# Patient Record
Sex: Female | Born: 1945
Health system: Southern US, Community
[De-identification: ages and names within clinical notes are randomized; demographics above are authoritative.]

## PROBLEM LIST (undated history)

## (undated) DIAGNOSIS — F418 Other specified anxiety disorders: Secondary | ICD-10-CM

## (undated) DIAGNOSIS — M1990 Unspecified osteoarthritis, unspecified site: Secondary | ICD-10-CM

## (undated) DIAGNOSIS — I890 Lymphedema, not elsewhere classified: Secondary | ICD-10-CM

## (undated) DIAGNOSIS — E785 Hyperlipidemia, unspecified: Secondary | ICD-10-CM

## (undated) DIAGNOSIS — R7309 Other abnormal glucose: Secondary | ICD-10-CM

## (undated) DIAGNOSIS — R7303 Prediabetes: Secondary | ICD-10-CM

## (undated) DIAGNOSIS — I10 Essential (primary) hypertension: Secondary | ICD-10-CM

## (undated) DIAGNOSIS — K219 Gastro-esophageal reflux disease without esophagitis: Secondary | ICD-10-CM

## (undated) DIAGNOSIS — R55 Syncope and collapse: Secondary | ICD-10-CM

## (undated) DIAGNOSIS — M797 Fibromyalgia: Secondary | ICD-10-CM

## (undated) DIAGNOSIS — C439 Malignant melanoma of skin, unspecified: Secondary | ICD-10-CM

## (undated) HISTORY — DX: Syncope and collapse: R55

## (undated) HISTORY — DX: Gastro-esophageal reflux disease without esophagitis: K21.9

## (undated) HISTORY — DX: Other specified anxiety disorders: F41.8

## (undated) HISTORY — DX: Other abnormal glucose: R73.09

## (undated) HISTORY — PX: ABDOMINAL HYSTERECTOMY: SUR658

## (undated) HISTORY — DX: Hyperlipidemia, unspecified: E78.5

## (undated) HISTORY — DX: Essential (primary) hypertension: I10

## (undated) HISTORY — DX: Malignant melanoma of skin, unspecified: C43.9

## (undated) HISTORY — PX: TOTAL KNEE ARTHROPLASTY: SHX125

## (undated) HISTORY — DX: Fibromyalgia: M79.7

## (undated) HISTORY — PX: BREAST SURGERY: SHX581

## (undated) HISTORY — PX: ELBOW SURGERY: SHX618

## (undated) HISTORY — PX: CHOLECYSTECTOMY: SHX55

## (undated) HISTORY — PX: OTHER SURGICAL HISTORY: SHX169

---

## 1999-07-23 ENCOUNTER — Other Ambulatory Visit: Admission: RE | Admit: 1999-07-23 | Discharge: 1999-07-23 | Payer: Self-pay | Admitting: Obstetrics and Gynecology

## 2000-09-19 ENCOUNTER — Other Ambulatory Visit: Admission: RE | Admit: 2000-09-19 | Discharge: 2000-09-19 | Payer: Self-pay | Admitting: Obstetrics and Gynecology

## 2001-03-13 ENCOUNTER — Inpatient Hospital Stay (HOSPITAL_COMMUNITY): Admission: EM | Admit: 2001-03-13 | Discharge: 2001-03-14 | Payer: Self-pay | Admitting: Emergency Medicine

## 2001-03-13 ENCOUNTER — Encounter: Payer: Self-pay | Admitting: Emergency Medicine

## 2001-03-13 ENCOUNTER — Encounter: Payer: Self-pay | Admitting: Orthopaedic Surgery

## 2001-03-24 ENCOUNTER — Encounter: Admission: RE | Admit: 2001-03-24 | Discharge: 2001-06-22 | Payer: Self-pay | Admitting: Orthopaedic Surgery

## 2001-06-23 ENCOUNTER — Encounter: Admission: RE | Admit: 2001-06-23 | Discharge: 2001-08-06 | Payer: Self-pay | Admitting: Orthopaedic Surgery

## 2001-08-27 ENCOUNTER — Encounter: Admission: RE | Admit: 2001-08-27 | Discharge: 2001-09-15 | Payer: Self-pay | Admitting: Orthopaedic Surgery

## 2002-04-25 ENCOUNTER — Encounter: Admission: RE | Admit: 2002-04-25 | Discharge: 2002-04-25 | Payer: Self-pay | Admitting: Unknown Physician Specialty

## 2002-04-25 ENCOUNTER — Encounter: Payer: Self-pay | Admitting: Unknown Physician Specialty

## 2003-02-09 ENCOUNTER — Encounter: Admission: RE | Admit: 2003-02-09 | Discharge: 2003-02-09 | Payer: Self-pay | Admitting: Unknown Physician Specialty

## 2003-02-09 ENCOUNTER — Encounter: Payer: Self-pay | Admitting: Unknown Physician Specialty

## 2003-03-08 ENCOUNTER — Ambulatory Visit (HOSPITAL_COMMUNITY): Admission: RE | Admit: 2003-03-08 | Discharge: 2003-03-08 | Payer: Self-pay | Admitting: Unknown Physician Specialty

## 2003-04-14 ENCOUNTER — Ambulatory Visit (HOSPITAL_COMMUNITY): Admission: RE | Admit: 2003-04-14 | Discharge: 2003-04-15 | Payer: Self-pay | Admitting: General Surgery

## 2003-04-14 ENCOUNTER — Encounter (INDEPENDENT_AMBULATORY_CARE_PROVIDER_SITE_OTHER): Payer: Self-pay | Admitting: *Deleted

## 2003-06-22 ENCOUNTER — Encounter: Admission: RE | Admit: 2003-06-22 | Discharge: 2003-06-22 | Payer: Self-pay | Admitting: Obstetrics and Gynecology

## 2004-07-18 ENCOUNTER — Encounter: Admission: RE | Admit: 2004-07-18 | Discharge: 2004-07-18 | Payer: Self-pay | Admitting: Obstetrics and Gynecology

## 2004-08-17 ENCOUNTER — Ambulatory Visit: Payer: Self-pay | Admitting: Family Medicine

## 2004-09-20 ENCOUNTER — Ambulatory Visit: Payer: Self-pay | Admitting: Family Medicine

## 2004-10-17 ENCOUNTER — Ambulatory Visit: Payer: Self-pay | Admitting: Family Medicine

## 2005-01-28 ENCOUNTER — Ambulatory Visit: Payer: Self-pay | Admitting: Family Medicine

## 2005-06-04 ENCOUNTER — Ambulatory Visit: Payer: Self-pay | Admitting: Family Medicine

## 2005-07-25 ENCOUNTER — Ambulatory Visit: Payer: Self-pay | Admitting: Family Medicine

## 2005-08-13 ENCOUNTER — Encounter: Admission: RE | Admit: 2005-08-13 | Discharge: 2005-08-13 | Payer: Self-pay | Admitting: Obstetrics and Gynecology

## 2005-08-29 ENCOUNTER — Encounter (INDEPENDENT_AMBULATORY_CARE_PROVIDER_SITE_OTHER): Payer: Self-pay | Admitting: Specialist

## 2005-08-29 ENCOUNTER — Ambulatory Visit (HOSPITAL_BASED_OUTPATIENT_CLINIC_OR_DEPARTMENT_OTHER): Admission: RE | Admit: 2005-08-29 | Discharge: 2005-08-29 | Payer: Self-pay | Admitting: General Surgery

## 2005-10-21 ENCOUNTER — Ambulatory Visit: Payer: Self-pay | Admitting: Hematology & Oncology

## 2005-11-11 LAB — COMPREHENSIVE METABOLIC PANEL
ALT: 40 U/L (ref 0–40)
AST: 25 U/L (ref 0–37)
CO2: 29 mEq/L (ref 19–32)
Chloride: 103 mEq/L (ref 96–112)
Sodium: 142 mEq/L (ref 135–145)
Total Bilirubin: 0.3 mg/dL (ref 0.3–1.2)
Total Protein: 7.4 g/dL (ref 6.0–8.3)

## 2005-11-11 LAB — CBC WITH DIFFERENTIAL/PLATELET
BASO%: 0.5 % (ref 0.0–2.0)
EOS%: 1.5 % (ref 0.0–7.0)
LYMPH%: 29.8 % (ref 14.0–48.0)
MCHC: 34.5 g/dL (ref 32.0–36.0)
MONO#: 0.5 10*3/uL (ref 0.1–0.9)
RBC: 4.4 10*6/uL (ref 3.70–5.32)
WBC: 9 10*3/uL (ref 3.9–10.0)
lymph#: 2.7 10*3/uL (ref 0.9–3.3)

## 2005-11-11 LAB — LACTATE DEHYDROGENASE: LDH: 180 U/L (ref 94–250)

## 2005-11-19 ENCOUNTER — Ambulatory Visit: Payer: Self-pay | Admitting: Family Medicine

## 2005-11-22 ENCOUNTER — Ambulatory Visit: Payer: Self-pay | Admitting: Family Medicine

## 2005-12-02 ENCOUNTER — Ambulatory Visit: Payer: Self-pay | Admitting: Family Medicine

## 2006-01-20 ENCOUNTER — Ambulatory Visit: Payer: Self-pay | Admitting: Family Medicine

## 2006-03-06 ENCOUNTER — Ambulatory Visit: Payer: Self-pay | Admitting: Hematology & Oncology

## 2006-03-10 LAB — CBC WITH DIFFERENTIAL/PLATELET
Eosinophils Absolute: 0.2 10*3/uL (ref 0.0–0.5)
MONO#: 0.4 10*3/uL (ref 0.1–0.9)
NEUT#: 5.2 10*3/uL (ref 1.5–6.5)
Platelets: 226 10*3/uL (ref 145–400)
RBC: 4.39 10*6/uL (ref 3.70–5.32)
RDW: 13.4 % (ref 11.3–14.5)
WBC: 8.3 10*3/uL (ref 3.9–10.0)

## 2006-03-10 LAB — LACTATE DEHYDROGENASE: LDH: 190 U/L (ref 94–250)

## 2006-03-10 LAB — COMPREHENSIVE METABOLIC PANEL
Albumin: 4.4 g/dL (ref 3.5–5.2)
CO2: 31 mEq/L (ref 19–32)
Chloride: 105 mEq/L (ref 96–112)
Glucose, Bld: 104 mg/dL — ABNORMAL HIGH (ref 70–99)
Potassium: 4.1 mEq/L (ref 3.5–5.3)
Sodium: 144 mEq/L (ref 135–145)
Total Protein: 7.2 g/dL (ref 6.0–8.3)

## 2006-05-27 ENCOUNTER — Ambulatory Visit: Payer: Self-pay | Admitting: Family Medicine

## 2006-06-18 ENCOUNTER — Encounter: Admission: RE | Admit: 2006-06-18 | Discharge: 2006-06-18 | Payer: Self-pay | Admitting: Unknown Physician Specialty

## 2006-07-02 ENCOUNTER — Ambulatory Visit: Payer: Self-pay | Admitting: Hematology & Oncology

## 2006-07-07 ENCOUNTER — Ambulatory Visit: Payer: Self-pay | Admitting: Hematology & Oncology

## 2006-07-07 LAB — COMPREHENSIVE METABOLIC PANEL
ALT: 32 U/L (ref 0–35)
AST: 26 U/L (ref 0–37)
Alkaline Phosphatase: 120 U/L — ABNORMAL HIGH (ref 39–117)
BUN: 15 mg/dL (ref 6–23)
Chloride: 103 mEq/L (ref 96–112)
Creatinine, Ser: 0.77 mg/dL (ref 0.40–1.20)
Total Bilirubin: 0.3 mg/dL (ref 0.3–1.2)

## 2006-07-07 LAB — CBC WITH DIFFERENTIAL/PLATELET
BASO%: 0.7 % (ref 0.0–2.0)
EOS%: 1.9 % (ref 0.0–7.0)
HCT: 40 % (ref 34.8–46.6)
LYMPH%: 24.8 % (ref 14.0–48.0)
MCH: 32.5 pg (ref 26.0–34.0)
MCHC: 35.1 g/dL (ref 32.0–36.0)
MCV: 92.6 fL (ref 81.0–101.0)
MONO%: 5.9 % (ref 0.0–13.0)
NEUT%: 66.7 % (ref 39.6–76.8)
lymph#: 2 10*3/uL (ref 0.9–3.3)

## 2006-07-21 ENCOUNTER — Ambulatory Visit: Payer: Self-pay | Admitting: Family Medicine

## 2006-08-20 ENCOUNTER — Encounter: Admission: RE | Admit: 2006-08-20 | Discharge: 2006-08-20 | Payer: Self-pay | Admitting: Obstetrics and Gynecology

## 2006-10-10 ENCOUNTER — Emergency Department (HOSPITAL_COMMUNITY): Admission: EM | Admit: 2006-10-10 | Discharge: 2006-10-10 | Payer: Self-pay | Admitting: Emergency Medicine

## 2006-11-05 ENCOUNTER — Ambulatory Visit: Payer: Self-pay | Admitting: Hematology & Oncology

## 2006-11-10 LAB — COMPREHENSIVE METABOLIC PANEL
AST: 21 U/L (ref 0–37)
Albumin: 4.3 g/dL (ref 3.5–5.2)
Alkaline Phosphatase: 100 U/L (ref 39–117)
BUN: 15 mg/dL (ref 6–23)
Calcium: 8.9 mg/dL (ref 8.4–10.5)
Chloride: 106 mEq/L (ref 96–112)
Creatinine, Ser: 0.88 mg/dL (ref 0.40–1.20)
Glucose, Bld: 130 mg/dL — ABNORMAL HIGH (ref 70–99)
Potassium: 3.9 mEq/L (ref 3.5–5.3)

## 2006-11-10 LAB — CBC WITH DIFFERENTIAL/PLATELET
Basophils Absolute: 0 10*3/uL (ref 0.0–0.1)
EOS%: 1.5 % (ref 0.0–7.0)
Eosinophils Absolute: 0.1 10*3/uL (ref 0.0–0.5)
HCT: 40.2 % (ref 34.8–46.6)
HGB: 14.2 g/dL (ref 11.6–15.9)
MCH: 32.9 pg (ref 26.0–34.0)
MCV: 93.2 fL (ref 81.0–101.0)
MONO%: 5.4 % (ref 0.0–13.0)
NEUT#: 6.1 10*3/uL (ref 1.5–6.5)
NEUT%: 68.3 % (ref 39.6–76.8)

## 2007-05-14 ENCOUNTER — Ambulatory Visit: Payer: Self-pay | Admitting: Hematology & Oncology

## 2007-05-18 LAB — COMPREHENSIVE METABOLIC PANEL
Albumin: 4.3 g/dL (ref 3.5–5.2)
BUN: 16 mg/dL (ref 6–23)
CO2: 27 mEq/L (ref 19–32)
Calcium: 9.2 mg/dL (ref 8.4–10.5)
Chloride: 104 mEq/L (ref 96–112)
Glucose, Bld: 109 mg/dL — ABNORMAL HIGH (ref 70–99)
Potassium: 4 mEq/L (ref 3.5–5.3)
Sodium: 143 mEq/L (ref 135–145)
Total Protein: 7.1 g/dL (ref 6.0–8.3)

## 2007-05-18 LAB — CBC WITH DIFFERENTIAL/PLATELET
Basophils Absolute: 0.1 10*3/uL (ref 0.0–0.1)
Eosinophils Absolute: 0.1 10*3/uL (ref 0.0–0.5)
HGB: 14.2 g/dL (ref 11.6–15.9)
MCV: 94 fL (ref 81.0–101.0)
MONO#: 0.4 10*3/uL (ref 0.1–0.9)
NEUT#: 6.4 10*3/uL (ref 1.5–6.5)
RBC: 4.39 10*6/uL (ref 3.70–5.32)
RDW: 13.3 % (ref 11.3–14.5)
WBC: 9.3 10*3/uL (ref 3.9–10.0)

## 2007-09-16 ENCOUNTER — Encounter: Admission: RE | Admit: 2007-09-16 | Discharge: 2007-09-16 | Payer: Self-pay | Admitting: Family Medicine

## 2008-11-15 ENCOUNTER — Inpatient Hospital Stay (HOSPITAL_COMMUNITY): Admission: RE | Admit: 2008-11-15 | Discharge: 2008-11-19 | Payer: Self-pay | Admitting: Orthopaedic Surgery

## 2008-12-19 ENCOUNTER — Encounter: Admission: RE | Admit: 2008-12-19 | Discharge: 2009-02-02 | Payer: Self-pay | Admitting: Orthopaedic Surgery

## 2009-02-03 ENCOUNTER — Encounter: Admission: RE | Admit: 2009-02-03 | Discharge: 2009-05-04 | Payer: Self-pay | Admitting: Orthopaedic Surgery

## 2009-05-29 ENCOUNTER — Ambulatory Visit (HOSPITAL_COMMUNITY): Admission: RE | Admit: 2009-05-29 | Discharge: 2009-05-29 | Payer: Self-pay | Admitting: Family Medicine

## 2009-09-25 ENCOUNTER — Encounter: Admission: RE | Admit: 2009-09-25 | Discharge: 2009-09-25 | Payer: Self-pay | Admitting: Family Medicine

## 2009-10-25 ENCOUNTER — Encounter: Admission: RE | Admit: 2009-10-25 | Discharge: 2009-10-25 | Payer: Self-pay | Admitting: General Surgery

## 2009-10-30 ENCOUNTER — Other Ambulatory Visit: Admission: RE | Admit: 2009-10-30 | Discharge: 2009-10-30 | Payer: Self-pay | Admitting: General Surgery

## 2010-05-28 ENCOUNTER — Encounter: Payer: Self-pay | Admitting: Family Medicine

## 2010-08-12 LAB — BASIC METABOLIC PANEL
CO2: 32 mEq/L (ref 19–32)
CO2: 29 mEq/L (ref 19–32)
CO2: 33 mEq/L — ABNORMAL HIGH (ref 19–32)
Calcium: 8.7 mg/dL (ref 8.4–10.5)
Calcium: 9.4 mg/dL (ref 8.4–10.5)
Chloride: 98 mEq/L (ref 96–112)
GFR calc Af Amer: >60 mL/min (ref >60)
GFR calc Af Amer: >60 mL/min (ref >60)
GFR calc Af Amer: >60 mL/min (ref >60)
GFR calc Af Amer: >60 mL/min (ref >60)
GFR calc non Af Amer: >60 mL/min (ref >60)
GFR calc non Af Amer: >60 mL/min (ref >60)
GFR calc non Af Amer: >60 mL/min (ref >60)
Glucose, Bld: 107 mg/dL — ABNORMAL HIGH (ref 70–99)
Potassium: 3.7 mEq/L (ref 3.5–5.1)
Potassium: 3.7 mEq/L (ref 3.5–5.1)
Sodium: 137 mEq/L (ref 135–145)
Sodium: 135 mEq/L (ref 135–145)
Sodium: 143 mEq/L (ref 135–145)

## 2010-08-12 LAB — CBC
HCT: 30.2 % — ABNORMAL LOW (ref 36.0–46.0)
HCT: 33.6 % — ABNORMAL LOW (ref 36.0–46.0)
Hemoglobin: 9.9 g/dL — ABNORMAL LOW (ref 12.0–15.0)
Hemoglobin: 14 g/dL (ref 12.0–15.0)
MCHC: 34.8 g/dL (ref 30.0–36.0)
MCV: 95 fL (ref 78.0–100.0)
Platelets: 154 10*3/uL (ref 150–400)
RBC: 2.99 MIL/uL — ABNORMAL LOW (ref 3.87–5.11)
RBC: 3.54 MIL/uL — ABNORMAL LOW (ref 3.87–5.11)
RBC: 4.23 MIL/uL (ref 3.87–5.11)
WBC: 12.2 10*3/uL — ABNORMAL HIGH (ref 4.0–10.5)
WBC: 12.8 10*3/uL — ABNORMAL HIGH (ref 4.0–10.5)
WBC: 13.9 10*3/uL — ABNORMAL HIGH (ref 4.0–10.5)
WBC: 8.8 10*3/uL (ref 4.0–10.5)

## 2010-08-12 LAB — PROTIME-INR
INR: 1.9 — ABNORMAL HIGH (ref 0.00–1.49)
INR: 2.8 — ABNORMAL HIGH (ref 0.00–1.49)
INR: 1 (ref 0.00–1.49)
Prothrombin Time: 22.4 seconds — ABNORMAL HIGH (ref 11.6–15.2)
Prothrombin Time: 13.4 seconds (ref 11.6–15.2)
Prothrombin Time: 14.4 seconds (ref 11.6–15.2)

## 2010-09-18 NOTE — Op Note (Signed)
Tiffany Brown, Tiffany Brown NO.:  [id number]   MEDICAL RECORD NO.:  [id number]          PATIENT TYPE:  INP   LOCATION:  5024                         FACILITY:  [facility]   PHYSICIAN:  Lubertha Basque. Dalldorf, M.D.DATE OF BIRTH:  [birth year]   DATE OF PROCEDURE:  11/15/2008  DATE OF DISCHARGE:                               OPERATIVE REPORT   PREOPERATIVE DIAGNOSIS:  Right knee degenerative joint disease.   POSTOPERATIVE DIAGNOSIS:  Right knee degenerative joint disease.   PROCEDURE:  Right total knee replacement.   ANESTHESIA:  General and block.   ATTENDING SURGEON:  Lubertha Basque. Jerl Santos, MD   ASSISTANT:  Lindwood Qua, PA   INDICATION FOR PROCEDURE:  The patient is a 65 year old woman with a  long history of right knee pain.  This has persisted despite oral anti-  inflammatories and various injectables.  She has pain which limits her  ability to walk and rest, and at this point is offered a knee  replacement operation.  She is noted to have advanced degenerative  change patellofemoral and moderate tibiofemoral on x-ray.  Informed  operative consent was obtained for total knee replacement after  discussion of possible complications including reaction to anesthesia,  infection, DVT, PE, and death.   SUMMARY OF FINDINGS AND PROCEDURE:  Under general anesthesia and block,  a right knee replacement was performed.  She ended up having advanced  degenerative change in all 3 compartments and just fair bone quality.  We addressed her problem with a cemented DePuy system using a standard  right LCS femur with a 3 MBT revision tray with a short stem to address  her stature.  We also placed a 10-mm deep dish insert with a 38 all-  polyethylene patella.  Bryna Colander assisted throughout and was  invaluable to the completion of the case in that he helped position and  retract while I performed the procedure.  He also closed simultaneously  to help minimize OR time.  We did include  Zinacef antibiotic in the  cement.  She was stiff preoperatively even asleep with motion of about  10-90 degrees, and at the end of the case, she easily went from slight  hyperextension to about 100 degrees of flexion against gravity.   DESCRIPTION OF PROCEDURE:  The patient was taken to the operating suite  where general anesthetic was applied without difficulty.  She was also  given a block in the preanesthesia area.  She was positioned supine and  prepped and draped in normal sterile fashion.  After the administration  of preop IV Kefzol, the right leg was elevated, exsanguinated,  tourniquet inflated about the thigh.  A longitudinal anterior incision  was made with dissection down to the extensor mechanism.  All  appropriate anti-infected measures were used including the preoperative  IV antibiotic, Betadine impregnated drape, and closed hooded exhaust  systems for each member of the surgical team.  A medial parapatellar  incision was made.  The kneecap was flipped with some difficulty due to  the size of her leg.  We flexed her knee up.  Some residual meniscal  tissues were removed along with the ACL and PCL.  An intramedullary  guide was placed in the tibia to make a cut roughly flat on the superior  surface of this bone.  We then placed another intramedullary guide in  the femur to make anterior and posterior cuts creating a flexion gap of  10 mm.  This did require one revision cut.  A second intramedullary  guide was placed in the femur to make a distal cut creating equal  extension gap of 10 mm balancing the knee.  We set her up a bit loose  due to her lack of motion preoperative.  The femur sized to a standard,  and the tibia to a 3 with the appropriate guides placed and utilized.  We did ream for the short stem on the MBT revision tray on the tibia.  The patella was cut down to thickness by about 8 mm to a 14 and sized to  a 38 with the appropriate guide placed and utilized.  A  trial reduction  was done with these components and the 10 spacer and she easily  hyperextended slightly and flexed up past 100 with the patella that  tracked very well.  The trial components removed followed by pulsatile  lavage irrigation of all 3 cut bony surfaces.  Cement was mixed  including Zinacef antibiotic was pressurized onto the bones followed by  placement of the aforementioned DePuy components.  Excess cement was  trimmed and pressure was held on the components until the cement had  hardened.  The tourniquet was deflated and a small amount of bleeding  was easily controlled with Bovie cautery and some pressure.  The knee  was irrigated several times, and a drain was placed exiting  superolaterally.  The extensor mechanism was reapproximated with #1  Vicryl in interrupted fashion followed by subcutaneous reapproximation  with 0 and 2-0 undyed Vicryl.  Skin was closed with staples.  Adaptic  was applied followed by dry gauze and loose Ace wrap.  Estimated blood  loss and intraoperative fluid was obtained from the anesthesia records.   DISPOSITION:  The patient was extubated in the operating room and taken  to recovery room in stable addition.  She was to be admitted to  Orthopedic Surgery Service for appropriate postop care to include  perioperative antibiotics and Coumadin plus Lovenox for DVT prophylaxis.      Lubertha Basque Jerl Santos, M.D.  Electronically Signed     PGD/MEDQ  D:  11/15/2008  T:  11/15/2008  Job:  [id number]

## 2010-09-21 NOTE — Discharge Summary (Signed)
NAMEADANA, MARIK NO.:  [id number]   MEDICAL RECORD NO.:  [id number]          PATIENT TYPE:  INP   LOCATION:  5024                         FACILITY:  [facility]   PHYSICIAN:  Lubertha Basque. Dalldorf, M.D.DATE OF BIRTH:  1946-01-06   DATE OF ADMISSION:  11/15/2008  DATE OF DISCHARGE:  11/19/2008                               DISCHARGE SUMMARY   ADMITTING DIAGNOSES:  1. Right knee end-stage degenerative joint disease.  2. Hypertension.  3. Depression, status post upper extremity fractures, healed.   DISCHARGE DIAGNOSES:  1. Right knee end-stage degenerative joint disease.  2. Hypertension.  3. Depression, status post upper extremity fractures, healed.  4. Pulmonary nodule noted on CT scan.   BRIEF HISTORY:  Tiffany Brown is a 65 year old patient well known to our  practice.  She has had previous injections and oral anti-inflammatory  medicines into her right knee, had a lot of positive relief.  X-rays  reveal end-stage DJD.  We have discussed treatment options with that  being total knee replacement.   PERTINENT LABORATORY DATA AND X-RAY FINDINGS:  CT angio chest revealed  no pulmonary embolus, but there was a nonspecific noncalcified 6-mm  right lower lobe pulmonary nodule noted with followup suggested in 6-12  months.   LABORATORY DATA:  CBC hemoglobin 9.9, hematocrit 28.6, WBCs 12.2,  platelets 161.  Sodium 137, potassium 3.5, BUN 9, creatinine 0.71,  glucose 95.  Serial INRs were done as she was on low-dose Coumadin  protocol; last testing was 2.8.   COURSE IN THE HOSPITAL:  She was admitted postoperatively, placed on a  variety of p.o. and IV analgesics including a PCA pump for morphine,  Coumadin, and Lovenox DVT prophylaxis protocol per pharmacy, IV Ancef 1  g q.8 h. x3 doses.  She had knee-high TEDs, incentive spirometer, CPM  machine, and then activity was weightbearing as tolerated with help of  physical therapy.  She was also given additional medicines,  which  included antispasmodics, i.e., muscle relaxers, ferrous sulfate, stool  softeners, and then her home medicines which will be outlined at the end  of this dictation.  The first day postop, her vital signs, blood  pressure 90/51, respirations 20, temperature 97, pulse oximeter 97-94.  Wound was noted to be benign.  No sign of infection or irritation.  She  was voiding on her.  Dressing was changed the second day postop and  drain was pulled and she was working on a CPM machine 0 to 50-60 degrees  without any sign of infection.  The third day postop, she had some  shortness of breath and her pulse ox O2 sat had decreased into the low  90s and a CT angiogram was done and showed no pulmonary embolus, but the  suggestion of a 6-mm noncalcified nodule to be followed up in 6 months.  She has progressed well with therapy and home therapy and INR for her  blood draws were arranged and was discharged home.   CONDITION ON DISCHARGE:  Improved.   She will remain on a low-sodium heart-healthy diet.  May change her  dressing daily.  Weightbearing as tolerated with a walker and CPM  machine at home.  Home physical therapy and blood draws to be arranged.  Any sign of infection to call our office at [phone], also to call that  same number for an appointment in 2 weeks.  She is let go on Coumadin,  dose regulated by pharmacy for 2 weeks with an INR between 2 and 3,  Percocet 1 or 2 every 4-6 hours, and then her home medicines which were  Robaxin 500 one q.6 h. p.r.n. spasm, Wellbutrin 150 mg at night, Os-Cal  500 mg 1 tablet p.o. daily, Lovaza 1 g p.o. b.i.d., Lasix 40 mg one a  day, Crestor 10 mg one a day, and Benicar 20 mg one a day.      Tiffany Brown, P.A.      Lubertha Basque Jerl Santos, M.D.  Electronically Signed    MC/MEDQ  D:  11/20/2008  T:  11/21/2008  Job:  [id number]

## 2011-05-13 ENCOUNTER — Other Ambulatory Visit: Payer: Self-pay | Admitting: Family Medicine

## 2011-05-13 DIAGNOSIS — Z1231 Encounter for screening mammogram for malignant neoplasm of breast: Secondary | ICD-10-CM

## 2011-06-04 ENCOUNTER — Ambulatory Visit
Admission: RE | Admit: 2011-06-04 | Discharge: 2011-06-04 | Disposition: A | Discharge status: Home / Self Care | Payer: Medicare Other | Source: Ambulatory Visit | Attending: Family Medicine | Admitting: Family Medicine

## 2011-06-04 DIAGNOSIS — Z1231 Encounter for screening mammogram for malignant neoplasm of breast: Secondary | ICD-10-CM

## 2011-08-16 ENCOUNTER — Encounter: Payer: Self-pay | Admitting: *Deleted

## 2011-08-20 ENCOUNTER — Ambulatory Visit (INDEPENDENT_AMBULATORY_CARE_PROVIDER_SITE_OTHER): Payer: Medicare Other | Admitting: Cardiology

## 2011-08-20 ENCOUNTER — Encounter: Payer: Self-pay | Admitting: *Deleted

## 2011-08-20 ENCOUNTER — Encounter: Payer: Self-pay | Admitting: Cardiology

## 2011-08-20 VITALS — BP 123/79 | HR 82 | Ht 65.5 in | Wt 245.0 lb

## 2011-08-20 DIAGNOSIS — E785 Hyperlipidemia, unspecified: Secondary | ICD-10-CM | POA: Insufficient documentation

## 2011-08-20 DIAGNOSIS — I10 Essential (primary) hypertension: Secondary | ICD-10-CM

## 2011-08-20 DIAGNOSIS — E1169 Type 2 diabetes mellitus with other specified complication: Secondary | ICD-10-CM | POA: Insufficient documentation

## 2011-08-20 DIAGNOSIS — R072 Precordial pain: Secondary | ICD-10-CM

## 2011-08-20 DIAGNOSIS — I152 Hypertension secondary to endocrine disorders: Secondary | ICD-10-CM | POA: Insufficient documentation

## 2011-08-20 DIAGNOSIS — Z136 Encounter for screening for cardiovascular disorders: Secondary | ICD-10-CM

## 2011-08-20 NOTE — Progress Notes (Signed)
HPI The patient presents for evaluation of chest discomfort. This has been happening for a couple of months. She does have fibromyalgia and so says that she has some trouble sorting out her pain. However, she thinks this is a different aching discomfort. It is dull. It is in her mid chest. It happens at rest but can be brought on by exertion such as vacuuming.  She says it is 4/10 in intensity. It goes away spontaneously after minutes. She doesn't describe associated symptoms such as not vomiting or diaphoresis. She does not think it radiates though she has chronic joint pains in her jaw and neck and arm. She does think it is stable over the last couple of months. She doesn't describe PND or orthopnea though she sleeps in a recliner because of aches and pains.  No Known Allergies  Current Outpatient Prescriptions  Medication Sig Dispense Refill  . cephALEXin (KEFLEX) 500 MG capsule Take 1 capsule by mouth Twice daily.      . fluticasone (FLONASE) 50 MCG/ACT nasal spray Place 2 sprays into the nose daily as needed.       . furosemide (LASIX) 40 MG tablet Take 40 mg by mouth daily as needed.       Marland Kitchen HYDROcodone-acetaminophen (VICODIN ES) 7.5-750 MG per tablet Take 1 tablet by mouth Every 12 hours as needed.      Marland Kitchen LORazepam (ATIVAN) 0.5 MG tablet Take 0.5 mg by mouth every 8 (eight) hours as needed.       . Pitavastatin Calcium (LIVALO) 2 MG TABS Take 1 tablet by mouth daily.      . valsartan (DIOVAN) 160 MG tablet Take 160 mg by mouth daily.      Marland Kitchen venlafaxine XR (EFFEXOR-XR) 150 MG 24 hr capsule Take 150 mg by mouth 3 (three) times daily.        Past Medical History  Diagnosis Date  . Depression with anxiety   . Essential hypertension, benign   . Other and unspecified hyperlipidemia   . Fibromyalgia     Past Surgical History  Procedure Date  . Cholecystectomy   . Abdominal hysterectomy   . Elbow surgery     X's 3 Left  . Breast surgery     LEFT  . Total knee arthroplasty    RIGHT  . Right distal radius fracture     Family History  Problem Relation Age of Onset  . Cancer Mother     lymphoma  . Heart disease Maternal Grandmother     no details    History   Social History  . Marital Status: Married    Spouse Name: N/A    Number of Children: N/A  . Years of Education: N/A   Occupational History  . Homemaker    Social History Main Topics  . Smoking status: Former Smoker -- 0.8 packs/day for 25 years    Types: Cigarettes    Quit date: 05/06/1994  . Smokeless tobacco: Never Used  . Alcohol Use: Not on file  . Drug Use: Not on file  . Sexually Active: Not on file   Other Topics Concern  . Not on file   Social History Narrative   Has 3 children. Lives with husband.    ROS:   Positive for muscle weakness and pain, decreased sleep, fatigue, joint pains, headaches, dizziness. Otherwise as stated in the HPI and negative for all other systems.  PHYSICAL EXAM BP 123/79  Pulse 82  Ht 5' 5.5" (1.664 m)  Wt 245 lb (111.131 kg)  BMI 40.15 kg/m2  SpO2 96% GENERAL:  Well appearing HEENT:  Pupils equal round and reactive, fundi not visualized, oral mucosa unremarkable NECK:  No jugular venous distention, waveform within normal limits, carotid upstroke brisk and symmetric, no bruits, no thyromegaly LYMPHATICS:  No cervical, inguinal adenopathy LUNGS:  Clear to auscultation bilaterally BACK:  No CVA tenderness CHEST:  Unremarkable HEART:  PMI not displaced or sustained,S1 and S2 within normal limits, no S3, no S4, no clicks, no rubs, no murmurs ABD:  Flat, positive bowel sounds normal in frequency in pitch, no bruits, no rebound, no guarding, no midline pulsatile mass, no hepatomegaly, no splenomegaly, obese EXT:  2 plus pulses throughout, positive edema, no cyanosis no clubbing SKIN:  Lower extremity erythema  NEURO:  Cranial nerves II through XII grossly intact, motor grossly intact throughout PSYCH:  Cognitively intact, oriented to person place  and time  ZOX:WRUEA rhythm, rate 66, axis within normal limits, intervals within normal limits, no acute ST-T wave changes.  08/20/2011   ASSESSMENT AND PLAN

## 2011-08-20 NOTE — Patient Instructions (Signed)
Your follow up will be based on your test results. Your physician recommends that you continue on your current medications as directed. Please refer to the Current Medication list given to you today. Your physician has requested that you have a lexiscan myoview. For further information please visit [email or web address]. Please follow instruction sheet, as given.  If the results of your test are normal or stable, you will receive a letter. If they are abnormal, the nurse will contact you by phone.  

## 2011-08-20 NOTE — Assessment & Plan Note (Signed)
Her chest pain has some worrisome features. She does have some risk factors. She needs a stress test but would not be a little walk on the treadmill. Therefore, she will have a [employer].

## 2011-08-20 NOTE — Assessment & Plan Note (Signed)
The blood pressure is at target. No change in medications is indicated. We will continue with therapeutic lifestyle changes (TLC).  

## 2011-08-20 NOTE — Assessment & Plan Note (Signed)
Goals for therapy will be based on the presence or absence of coronary artery disease. For now she remains on the meds as listed.

## 2011-09-12 ENCOUNTER — Encounter: Payer: Self-pay | Admitting: Cardiology

## 2011-10-08 ENCOUNTER — Other Ambulatory Visit: Payer: Self-pay | Admitting: Surgery

## 2011-11-13 ENCOUNTER — Telehealth: Payer: Self-pay | Admitting: *Deleted

## 2011-11-13 NOTE — Telephone Encounter (Signed)
Spoke with patient inquiring why stress test never done and patient decided that she did not want to have it done. Nurse explained to patient the reasons tests was ordered and patient still saying she doesn't want stress test done. Patient was contacted several times after April appointment by mail in r/e to getting test rescheduled. MD will be notified.

## 2011-11-14 NOTE — Telephone Encounter (Signed)
Patient does not want to have stress test as indicated.

## 2012-05-07 ENCOUNTER — Other Ambulatory Visit: Payer: Self-pay | Admitting: Gastroenterology

## 2012-05-15 ENCOUNTER — Other Ambulatory Visit: Payer: Self-pay | Admitting: Gastroenterology

## 2012-05-15 DIAGNOSIS — R197 Diarrhea, unspecified: Secondary | ICD-10-CM

## 2012-05-20 ENCOUNTER — Ambulatory Visit
Admission: RE | Admit: 2012-05-20 | Discharge: 2012-05-20 | Disposition: A | Discharge status: Home / Self Care | Payer: Medicare Other | Source: Ambulatory Visit | Attending: Gastroenterology | Admitting: Gastroenterology

## 2012-05-20 ENCOUNTER — Other Ambulatory Visit: Payer: Self-pay | Admitting: Gastroenterology

## 2012-05-20 DIAGNOSIS — R197 Diarrhea, unspecified: Secondary | ICD-10-CM

## 2012-07-24 ENCOUNTER — Other Ambulatory Visit: Payer: Self-pay

## 2012-07-24 DIAGNOSIS — Z1231 Encounter for screening mammogram for malignant neoplasm of breast: Secondary | ICD-10-CM

## 2012-08-24 ENCOUNTER — Ambulatory Visit
Admission: RE | Admit: 2012-08-24 | Discharge: 2012-08-24 | Disposition: A | Discharge status: Home / Self Care | Payer: Medicare Other | Source: Ambulatory Visit

## 2012-08-24 DIAGNOSIS — Z1231 Encounter for screening mammogram for malignant neoplasm of breast: Secondary | ICD-10-CM

## 2013-06-07 ENCOUNTER — Ambulatory Visit: Payer: Medicare Other | Attending: Orthopaedic Surgery | Admitting: Physical Therapy

## 2013-06-07 DIAGNOSIS — M545 Low back pain, unspecified: Secondary | ICD-10-CM | POA: Insufficient documentation

## 2013-06-07 DIAGNOSIS — IMO0001 Reserved for inherently not codable concepts without codable children: Secondary | ICD-10-CM | POA: Insufficient documentation

## 2013-06-07 DIAGNOSIS — R5381 Other malaise: Secondary | ICD-10-CM | POA: Insufficient documentation

## 2013-06-09 ENCOUNTER — Ambulatory Visit: Payer: Medicare Other | Admitting: Physical Therapy

## 2013-06-14 ENCOUNTER — Ambulatory Visit: Payer: Medicare Other | Admitting: Physical Therapy

## 2013-06-16 ENCOUNTER — Ambulatory Visit: Payer: Medicare Other | Admitting: Physical Therapy

## 2013-06-21 ENCOUNTER — Ambulatory Visit: Payer: Medicare Other | Admitting: Physical Therapy

## 2013-06-23 ENCOUNTER — Encounter: Payer: Medicare Other | Admitting: Physical Therapy

## 2013-06-28 ENCOUNTER — Ambulatory Visit: Payer: Medicare Other | Admitting: Physical Therapy

## 2013-07-01 ENCOUNTER — Encounter: Payer: Medicare Other | Admitting: Physical Therapy

## 2013-07-05 ENCOUNTER — Ambulatory Visit: Payer: Medicare Other | Attending: Orthopaedic Surgery | Admitting: Physical Therapy

## 2013-07-05 DIAGNOSIS — R5381 Other malaise: Secondary | ICD-10-CM | POA: Insufficient documentation

## 2013-07-05 DIAGNOSIS — M545 Low back pain, unspecified: Secondary | ICD-10-CM | POA: Insufficient documentation

## 2013-07-07 ENCOUNTER — Ambulatory Visit: Payer: Medicare Other | Admitting: Physical Therapy

## 2013-07-12 ENCOUNTER — Ambulatory Visit: Payer: Medicare Other | Admitting: Physical Therapy

## 2013-07-15 ENCOUNTER — Encounter: Payer: Medicare Other | Admitting: Physical Therapy

## 2013-07-19 ENCOUNTER — Ambulatory Visit: Payer: Medicare Other | Admitting: Physical Therapy

## 2013-07-21 ENCOUNTER — Ambulatory Visit: Payer: Medicare Other | Admitting: Physical Therapy

## 2013-07-26 ENCOUNTER — Ambulatory Visit: Payer: Medicare Other | Admitting: Physical Therapy

## 2013-09-10 ENCOUNTER — Other Ambulatory Visit: Payer: Self-pay

## 2013-09-10 DIAGNOSIS — Z1231 Encounter for screening mammogram for malignant neoplasm of breast: Secondary | ICD-10-CM

## 2013-09-14 DIAGNOSIS — R609 Edema, unspecified: Secondary | ICD-10-CM | POA: Insufficient documentation

## 2013-09-21 ENCOUNTER — Ambulatory Visit
Admission: RE | Admit: 2013-09-21 | Discharge: 2013-09-21 | Disposition: A | Discharge status: Home / Self Care | Payer: Medicare Other | Source: Ambulatory Visit

## 2013-09-21 DIAGNOSIS — Z1231 Encounter for screening mammogram for malignant neoplasm of breast: Secondary | ICD-10-CM

## 2013-10-06 ENCOUNTER — Other Ambulatory Visit: Payer: Self-pay | Admitting: Pain Medicine

## 2013-10-06 DIAGNOSIS — M545 Low back pain, unspecified: Secondary | ICD-10-CM

## 2013-10-06 DIAGNOSIS — R202 Paresthesia of skin: Secondary | ICD-10-CM

## 2013-10-06 DIAGNOSIS — R200 Anesthesia of skin: Secondary | ICD-10-CM

## 2013-10-06 DIAGNOSIS — M542 Cervicalgia: Secondary | ICD-10-CM

## 2013-10-13 ENCOUNTER — Ambulatory Visit
Admission: RE | Admit: 2013-10-13 | Discharge: 2013-10-13 | Disposition: A | Discharge status: Home / Self Care | Payer: Medicare Other | Source: Ambulatory Visit | Attending: Pain Medicine | Admitting: Pain Medicine

## 2013-10-13 DIAGNOSIS — M545 Low back pain, unspecified: Secondary | ICD-10-CM

## 2013-10-13 DIAGNOSIS — R200 Anesthesia of skin: Secondary | ICD-10-CM

## 2013-10-13 DIAGNOSIS — R202 Paresthesia of skin: Secondary | ICD-10-CM

## 2013-10-13 DIAGNOSIS — M542 Cervicalgia: Secondary | ICD-10-CM

## 2013-10-23 ENCOUNTER — Ambulatory Visit
Admission: RE | Admit: 2013-10-23 | Discharge: 2013-10-23 | Disposition: A | Discharge status: Home / Self Care | Payer: Medicare Other | Source: Ambulatory Visit | Attending: Pain Medicine | Admitting: Pain Medicine

## 2013-10-23 ENCOUNTER — Inpatient Hospital Stay: Admission: RE | Admit: 2013-10-23 | Payer: Medicare Other | Source: Ambulatory Visit

## 2014-06-16 ENCOUNTER — Encounter (HOSPITAL_COMMUNITY): Payer: Self-pay | Admitting: Emergency Medicine

## 2014-06-16 ENCOUNTER — Emergency Department (HOSPITAL_COMMUNITY)
Admission: EM | Admit: 2014-06-16 | Discharge: 2014-06-16 | Disposition: A | Discharge status: Home / Self Care | Payer: Medicare Other | Attending: Emergency Medicine | Admitting: Emergency Medicine

## 2014-06-16 DIAGNOSIS — I951 Orthostatic hypotension: Secondary | ICD-10-CM

## 2014-06-16 DIAGNOSIS — Z792 Long term (current) use of antibiotics: Secondary | ICD-10-CM | POA: Diagnosis not present

## 2014-06-16 DIAGNOSIS — Z8739 Personal history of other diseases of the musculoskeletal system and connective tissue: Secondary | ICD-10-CM | POA: Insufficient documentation

## 2014-06-16 DIAGNOSIS — Y9289 Other specified places as the place of occurrence of the external cause: Secondary | ICD-10-CM | POA: Diagnosis not present

## 2014-06-16 DIAGNOSIS — Z7951 Long term (current) use of inhaled steroids: Secondary | ICD-10-CM | POA: Diagnosis not present

## 2014-06-16 DIAGNOSIS — Y9389 Activity, other specified: Secondary | ICD-10-CM | POA: Diagnosis not present

## 2014-06-16 DIAGNOSIS — W01198A Fall on same level from slipping, tripping and stumbling with subsequent striking against other object, initial encounter: Secondary | ICD-10-CM | POA: Diagnosis not present

## 2014-06-16 DIAGNOSIS — F419 Anxiety disorder, unspecified: Secondary | ICD-10-CM | POA: Diagnosis not present

## 2014-06-16 DIAGNOSIS — Z8639 Personal history of other endocrine, nutritional and metabolic disease: Secondary | ICD-10-CM | POA: Diagnosis not present

## 2014-06-16 DIAGNOSIS — F329 Major depressive disorder, single episode, unspecified: Secondary | ICD-10-CM | POA: Insufficient documentation

## 2014-06-16 DIAGNOSIS — I10 Essential (primary) hypertension: Secondary | ICD-10-CM | POA: Insufficient documentation

## 2014-06-16 DIAGNOSIS — S8992XA Unspecified injury of left lower leg, initial encounter: Secondary | ICD-10-CM | POA: Diagnosis present

## 2014-06-16 DIAGNOSIS — Y998 Other external cause status: Secondary | ICD-10-CM | POA: Diagnosis not present

## 2014-06-16 DIAGNOSIS — R55 Syncope and collapse: Secondary | ICD-10-CM | POA: Insufficient documentation

## 2014-06-16 DIAGNOSIS — S80812A Abrasion, left lower leg, initial encounter: Secondary | ICD-10-CM | POA: Diagnosis not present

## 2014-06-16 DIAGNOSIS — Z87891 Personal history of nicotine dependence: Secondary | ICD-10-CM | POA: Diagnosis not present

## 2014-06-16 DIAGNOSIS — S81812A Laceration without foreign body, left lower leg, initial encounter: Secondary | ICD-10-CM

## 2014-06-16 NOTE — ED Provider Notes (Signed)
CSN: [id number]     Arrival date & time 06/16/14  1422 History   First MD Initiated Contact with Patient 06/16/14 1459     Chief Complaint  Patient presents with  . Fall  . Abrasion     (Consider location/radiation/quality/duration/timing/severity/associated sxs/prior Treatment) HPI Comments: 69 yo female who had an episode of near syncope after standing from a supine/legs elevated position.  She has been taking extra lasix this week under the direction of her doctor due to increased lower extremity edema.  The edema has been getting better throughout the week.    Today, she was lying down with her feet up and stoop up quickly.  She became lightheaded and fell to the floor, striking her left shin.  She did not lose consciousness.  She did not strike her head.  She denies neck or back injuries or any other injuries.    Similar episodes of dizziness have been occuring for about the last year, each time when she stands up quickly.  She has not sought medical care regarding these episodes.  She notes that when she stands up more slowly, her dizziness is better or absent.   Patient is a 69 y.o. female presenting with near-syncope.  Near Syncope This is a recurrent problem. The current episode started less than 1 hour ago. Episode frequency: once. The problem has been resolved. Pertinent negatives include no chest pain, no abdominal pain, no headaches and no shortness of breath. The symptoms are aggravated by standing. The symptoms are relieved by lying down. She has tried nothing for the symptoms.    Past Medical History  Diagnosis Date  . Depression with anxiety   . Essential hypertension, benign   . Other and unspecified hyperlipidemia   . Fibromyalgia    Past Surgical History  Procedure Laterality Date  . Cholecystectomy    . Abdominal hysterectomy    . Elbow surgery      X's 3 Left  . Breast surgery      LEFT  . Total knee arthroplasty      RIGHT  . Right distal radius fracture      Family History  Problem Relation Age of Onset  . Cancer Mother     lymphoma  . Heart disease Maternal Grandmother     no details   History  Substance Use Topics  . Smoking status: Former Smoker -- 0.80 packs/day for 25 years    Types: Cigarettes    Quit date: 05/06/1994  . Smokeless tobacco: Never Used  . Alcohol Use: Not on file   OB History    No data available     Review of Systems  Respiratory: Negative for shortness of breath.   Cardiovascular: Positive for near-syncope. Negative for chest pain.  Gastrointestinal: Negative for abdominal pain.  Neurological: Negative for headaches.  All other systems reviewed and are negative.     Allergies  Review of patient's allergies indicates no known allergies.  Home Medications   Prior to Admission medications   Medication Sig Start Date End Date Taking? Authorizing Provider  clobetasol cream (TEMOVATE) 8.41 % Apply 1 application topically at bedtime.   Yes Historical Provider, MD  doxycycline (VIBRA-TABS) 100 MG tablet Take 100 mg by mouth 2 (two) times daily.   Yes Historical Provider, MD  fluticasone (FLONASE) 50 MCG/ACT nasal spray Place 2 sprays into the nose daily as needed (allergies).    Yes Historical Provider, MD  furosemide (LASIX) 40 MG tablet Take 80 mg by mouth  daily.    Yes Historical Provider, MD  HYDROcodone-acetaminophen (VICODIN ES) 7.5-750 MG per tablet Take 1 tablet by mouth Every 12 hours as needed for pain (pain).  07/23/11  Yes Historical Provider, MD  losartan (COZAAR) 100 MG tablet Take 100 mg by mouth daily.   Yes Historical Provider, MD  venlafaxine XR (EFFEXOR-XR) 150 MG 24 hr capsule Take 150 mg by mouth 2 (two) times daily.    Yes Historical Provider, MD  LORazepam (ATIVAN) 0.5 MG tablet Take 0.5 mg by mouth every 8 (eight) hours as needed.     Historical Provider, MD   BP 141/57 mmHg  Pulse 92  Temp(Src) 97.7 F (36.5 C) (Oral)  Resp 18  SpO2 93% Physical Exam  Constitutional: She is  oriented to person, place, and time. She appears well-developed and well-nourished. No distress.  HENT:  Head: Normocephalic and atraumatic.  Mouth/Throat: Oropharynx is clear and moist.  Eyes: Conjunctivae are normal. Pupils are equal, round, and reactive to light. No scleral icterus.  Neck: Neck supple.  Cardiovascular: Normal rate, regular rhythm, normal heart sounds and intact distal pulses.   No murmur heard. Pulmonary/Chest: Effort normal and breath sounds normal. No stridor. No respiratory distress. She has no rales.  Abdominal: Soft. Bowel sounds are normal. She exhibits no distension. There is no tenderness.  Musculoskeletal: Normal range of motion. She exhibits edema (2+ BLE with bilateral shin erythema and venous stasis changes).  Neurological: She is alert and oriented to person, place, and time.  Skin: Skin is warm and dry. No rash noted.     Psychiatric: She has a normal mood and affect. Her behavior is normal.  Nursing note and vitals reviewed.   ED Course  Procedures (including critical care time) Labs Review Labs Reviewed - No data to display  Imaging Review No results found.   EKG Interpretation   Date/Time:  Thursday June 16 2014 15:05:32 EST Ventricular Rate:  91 PR Interval:  143 QRS Duration: 113 QT Interval:  385 QTC Calculation: 474 R Axis:   -6 Text Interpretation:  Sinus rhythm Borderline intraventricular conduction  delay Abnormal R-wave progression, early transition Borderline T  abnormalities, anterior leads No significant change was found Confirmed by  [facility]  MD, TREY ([id number]) on 06/16/2014 3:24:27 PM      MDM   Final diagnoses:  Near syncope  Orthostasis  Skin tear of left lower leg without complication, initial encounter    Her dizziness is consistent with orthostatic near syncope.  She is on Cozaar and lasix, possibly predisposing her to these symptoms.  I advised behavior modification to improve symptoms and PCP follow up. No  true syncope, no chest pain, no other symptoms to suggest cardiogenic syncope.    She has a skin tear from the fall, but no other apparent injuries.  Nursing has cleansed and dressed the wound.      Houston Siren III, MD 06/16/14 [phone]

## 2014-06-16 NOTE — Discharge Instructions (Signed)
Dizziness °Dizziness is a common problem. It is a feeling of unsteadiness or light-headedness. You may feel like you are about to faint. Dizziness can lead to injury if you stumble or fall. A person of any age group can suffer from dizziness, but dizziness is more common in older adults. °CAUSES  °Dizziness can be caused by many different things, including: °· Middle ear problems. °· Standing for too long. °· Infections. °· An allergic reaction. °· Aging. °· An emotional response to something, such as the sight of blood. °· Side effects of medicines. °· Tiredness. °· Problems with circulation or blood pressure. °· Excessive use of alcohol or medicines, or illegal drug use. °· Breathing too fast (hyperventilation). °· An irregular heart rhythm (arrhythmia). °· A low red blood cell count (anemia). °· Pregnancy. °· Vomiting, diarrhea, fever, or other illnesses that cause body fluid loss (dehydration). °· Diseases or conditions such as Parkinson's disease, high blood pressure (hypertension), diabetes, and thyroid problems. °· Exposure to extreme heat. °DIAGNOSIS  °Your health care provider will ask about your symptoms, perform a physical exam, and perform an electrocardiogram (ECG) to record the electrical activity of your heart. Your health care provider may also perform other heart or blood tests to determine the cause of your dizziness. These may include: °· Transthoracic echocardiogram (TTE). During echocardiography, sound waves are used to evaluate how blood flows through your heart. °· Transesophageal echocardiogram (TEE). °· Cardiac monitoring. This allows your health care provider to monitor your heart rate and rhythm in real time. °· Holter monitor. This is a portable device that records your heartbeat and can help diagnose heart arrhythmias. It allows your health care provider to track your heart activity for several days if needed. °· Stress tests by exercise or by giving medicine that makes the heart beat  faster. °TREATMENT  °Treatment of dizziness depends on the cause of your symptoms and can vary greatly. °HOME CARE INSTRUCTIONS  °· Drink enough fluids to keep your urine clear or pale yellow. This is especially important in very hot weather. In older adults, it is also important in cold weather. °· Take your medicine exactly as directed if your dizziness is caused by medicines. When taking blood pressure medicines, it is especially important to get up slowly. °· Rise slowly from chairs and steady yourself until you feel okay. °· In the morning, first sit up on the side of the bed. When you feel okay, stand slowly while holding onto something until you know your balance is fine. °· Move your legs often if you need to stand in one place for a long time. Tighten and relax your muscles in your legs while standing. °· Have someone stay with you for 1-2 days if dizziness continues to be a problem. Do this until you feel you are well enough to stay alone. Have the person call your health care provider if he or she notices changes in you that are concerning. °· Do not drive or use heavy machinery if you feel dizzy. °· Do not drink alcohol. °SEEK IMMEDIATE MEDICAL CARE IF:  °· Your dizziness or light-headedness gets worse. °· You feel nauseous or vomit. °· You have problems talking, walking, or using your arms, hands, or legs. °· You feel weak. °· You are not thinking clearly or you have trouble forming sentences. It may take a friend or family member to notice this. °· You have chest pain, abdominal pain, shortness of breath, or sweating. °· Your vision changes. °· You notice   any bleeding.  You have side effects from medicine that seems to be getting worse rather than better. MAKE SURE YOU:   Understand these instructions.  Will watch your condition.  Will get help right away if you are not doing well or get worse. Document Released: 10/16/2000 Document Revised: 04/27/2013 Document Reviewed: 11/09/2010 [facility]  Patient Information 2015 [place], [place]. This information is not intended to replace advice given to you by your health care provider. Make sure you discuss any questions you have with your health care provider.  Skin Tear Care A skin tear is a wound in which the top layer of skin has peeled off. This is a common problem with aging because the skin becomes thinner and more fragile as a person gets older. In addition, some medicines, such as oral corticosteroids, can lead to skin thinning if taken for long periods of time.  A skin tear is often repaired with tape or skin adhesive strips. This keeps the skin that has been peeled off in contact with the healthier skin beneath. Depending on the location of the wound, a bandage (dressing) may be applied over the tape or skin adhesive strips. Sometimes, during the healing process, the skin turns black and dies. Even when this happens, the torn skin acts as a good dressing until the skin underneath gets healthier and repairs itself. HOME CARE INSTRUCTIONS   Change dressings once per day or as directed by your caregiver.  Gently clean the skin tear and the area around the tear using saline solution or mild soap and water.  Do not rub the injured skin dry. Let the area air dry.  Apply petroleum jelly or an antibiotic cream or ointment to keep the tear moist. This will help the wound heal. Do not allow a scab to form.  If the dressing sticks before the next dressing change, moisten it with warm soapy water and gently remove it.  Protect the injured skin until it has healed.  Only take over-the-counter or prescription medicines as directed by your caregiver.  Take showers or baths using warm soapy water. Apply a new dressing after the shower or bath.  Keep all follow-up appointments as directed by your caregiver.  SEEK IMMEDIATE MEDICAL CARE IF:   You have redness, swelling, or increasing pain in the skin tear.  You havepus coming from the skin  tear.  You have chills.  You have a red streak that goes away from the skin tear.  You have a bad smell coming from the tear or dressing.  You have a fever or persistent symptoms for more than 2-3 days.  You have a fever and your symptoms suddenly get worse. MAKE SURE YOU:  Understand these instructions.  Will watch this condition.  Will get help right away if your child is not doing well or gets worse. Document Released: 01/15/2001 Document Revised: 01/15/2012 Document Reviewed: 11/04/2011 [facility] Patient Information 2015 [place], [place]. This information is not intended to replace advice given to you by your health care provider. Make sure you discuss any questions you have with your health care provider.

## 2014-06-16 NOTE — ED Notes (Signed)
Per EMS, pt states she stood up too quickly from a lying position, began to feel weak and dizzy. She fell to the ground, did not lose conciousness throughout episode. States she has done this before, many times and been seen by a doctor for it. Came in for two inch skin tear on left shin.

## 2014-06-16 NOTE — ED Notes (Signed)
MD at bedside. 

## 2014-07-18 ENCOUNTER — Telehealth (HOSPITAL_COMMUNITY): Payer: Self-pay | Admitting: Cardiology

## 2014-07-18 NOTE — Telephone Encounter (Signed)
Received records from [facility] for appointment on 07/20/14 with Dr Percival Spanish.  Records given to [facility] (medical records) for Dr Hochrein's schedule on 07/20/14. lp

## 2014-07-19 ENCOUNTER — Institutional Professional Consult (permissible substitution): Payer: Medicare Other | Admitting: Cardiology

## 2014-07-20 ENCOUNTER — Ambulatory Visit (INDEPENDENT_AMBULATORY_CARE_PROVIDER_SITE_OTHER): Payer: Medicare Other | Admitting: Cardiology

## 2014-07-20 ENCOUNTER — Encounter: Payer: Self-pay | Admitting: Cardiology

## 2014-07-20 VITALS — BP 139/82 | HR 95 | Ht 65.5 in | Wt 251.0 lb

## 2014-07-20 DIAGNOSIS — R55 Syncope and collapse: Secondary | ICD-10-CM | POA: Diagnosis not present

## 2014-07-20 NOTE — Patient Instructions (Signed)
The current medical regimen is effective;  continue present plan and medications.  Please have lab work at your primary care physcian's office. (TSH)  Follow up with Dr Percival Spanish as needed.  Thank you for choosing [facility]!!

## 2014-07-20 NOTE — Progress Notes (Signed)
Cardiology Office Note   Date:  07/20/2014   ID:  Tiffany Brown, DOB 10-Mar-1946, MRN [id number]  PCP:  Sherrie Mustache, MD  Cardiologist:   Minus Breeding, MD   Chief Complaint  Patient presents with  . Near Syncope      History of Present Illness: Tiffany Brown is a 69 y.o. female who presents for syncope.  The patient presents for evaluation of near syncope.  I saw her almost three years ago for chest pain. She was supposed to have a stress test but she did not show up for this. Since then she's had no further cardiac evaluation. However, in late February she had an episode where she stood up suddenly and passed out. She injured her leg having to get some wound care for that. She said she felt dizzy immediately upon standing up and then lost consciousness. She says she does get some symptoms when she changes position. She does not feel palpitations, presyncope or syncope. She does not have chest pressure, neck or arm discomfort. She does not have weight gain or edema. She feels tired all the time. I did review emergency room records which did not include orthostatic blood pressures were labs. She did have an EKG as described below.     Past Medical History  Diagnosis Date  . Depression with anxiety   . Essential hypertension, benign   . Other and unspecified hyperlipidemia   . Fibromyalgia   . Near syncope   . Abnormal glucose   . Melanoma     Past Surgical History  Procedure Laterality Date  . Cholecystectomy    . Abdominal hysterectomy    . Elbow surgery      X's 3 Left  . Breast surgery      LEFT  . Total knee arthroplasty      RIGHT  . Right distal radius fracture       Current Outpatient Prescriptions  Medication Sig Dispense Refill  . ampicillin (PRINCIPEN) 250 MG capsule Take 250 mg by mouth 3 (three) times daily.    . clobetasol cream (TEMOVATE) 4.33 % Apply 1 application topically at bedtime.    . fluticasone (FLONASE) 50 MCG/ACT nasal  spray Place 2 sprays into the nose daily as needed (allergies).     . furosemide (LASIX) 40 MG tablet Take 40 mg by mouth 2 (two) times daily.     Marland Kitchen gabapentin (NEURONTIN) 100 MG capsule Take 100 mg by mouth 3 (three) times daily.     Marland Kitchen HYDROcodone-acetaminophen (NORCO) 10-325 MG per tablet     . HYDROcodone-acetaminophen (NORCO/VICODIN) 5-325 MG per tablet Take 1 tablet by mouth.    . losartan (COZAAR) 100 MG tablet Take 100 mg by mouth daily.    . montelukast (SINGULAIR) 10 MG tablet TAKE ONE TABLET BY MOUTH ONE TIME DAILY    . pravastatin (PRAVACHOL) 80 MG tablet Take 80 mg by mouth.    . pravastatin (PRAVACHOL) 80 MG tablet Take 80 mg by mouth daily.    . silver sulfADIAZINE (SILVADENE) 1 % cream Apply 1 application topically daily.     Marland Kitchen venlafaxine XR (EFFEXOR-XR) 150 MG 24 hr capsule Take 150 mg by mouth 2 (two) times daily.      No current facility-administered medications for this visit.    Allergies:   Review of patient's allergies indicates no known allergies.    ROS:  Please see the history of present illness.   Otherwise, review of systems are positive  for dizziness, dyspnea, diarrhea, she, joint pains or swelling..   All other systems are reviewed and negative.    PHYSICAL EXAM: VS:  BP 142/98 mmHg  Pulse 92  Ht 5' 5.5" (1.664 m)  Wt 251 lb (113.853 kg)  BMI 41.12 kg/m2 , BMI Body mass index is 41.12 kg/(m^2). GENERAL:  Well appearing HEENT:  Pupils equal round and reactive, fundi not visualized, oral mucosa unremarkable NECK:  No jugular venous distention, waveform within normal limits, carotid upstroke brisk and symmetric, no bruits, no thyromegaly LYMPHATICS:  No cervical, inguinal adenopathy LUNGS:  Clear to auscultation bilaterally BACK:  No CVA tenderness CHEST:  Unremarkable HEART:  PMI not displaced or sustained,S1 and S2 within normal limits, no S3, no S4, no clicks, no rubs, no murmurs ABD:  Flat, positive bowel sounds normal in frequency in pitch, no  bruits, no rebound, no guarding, no midline pulsatile mass, no hepatomegaly, no splenomegaly EXT:  2 plus pulses throughout, no edema, no cyanosis no clubbing SKIN:  No rashes no nodules NEURO:  Cranial nerves II through XII grossly intact, motor grossly intact throughout PSYCH:  Cognitively intact, oriented to person place and time    EKG:  EKG is not ordered today. The ekg ordered 2/21 demonstrates sinus rhythm, rate 91, axis within normal limits, early transition in lead V2, no acute ST-T wave changes.   Recent Labs: No results found for requested labs within last 365 days.    Lipid Panel No results found for: CHOL, TRIG, HDL, CHOLHDL, VLDL, LDLCALC, LDLDIRECT    Wt Readings from Last 3 Encounters:  07/20/14 251 lb (113.853 kg)  08/20/11 245 lb (111.131 kg)      Other studies Reviewed: Additional studies/ records that were reviewed today include: EKG as above.  ED records and note. Review of the above records demonstrates:  Please see elsewhere in the note.  See above   ASSESSMENT AND PLAN:  SYNCOPE:  This was almost definitely orthostasis. We discussed this at length. We discussed conservative therapies such as compression stockings. No further workup is suggested when she has recurrent episodes.  Of note she was mildly orthostatic in the office.  DM:  She has a hemoglobin A1c that I reviewed from her primary care doctor's office. This was 6.7. We talked about weight loss with diet and exercise and we got specific about strategies.  FATIGUE:  I gave her written instructions to get a TSH one has not been done recently. I couldn't find one.   Current medicines are reviewed at length with the patient today.  The patient does not have concerns regarding medicines.  The following changes have been made:  no change  Labs/ tests ordered today include: TSH  No orders of the defined types were placed in this encounter.     Disposition:   FU with me as needed     Signed, Minus Breeding, MD  07/20/2014 2:41 PM    [facility]

## 2014-10-14 ENCOUNTER — Other Ambulatory Visit: Payer: Self-pay

## 2014-10-14 DIAGNOSIS — Z1231 Encounter for screening mammogram for malignant neoplasm of breast: Secondary | ICD-10-CM

## 2014-11-23 ENCOUNTER — Ambulatory Visit
Admission: RE | Admit: 2014-11-23 | Discharge: 2014-11-23 | Disposition: A | Discharge status: Home / Self Care | Payer: Medicare Other | Source: Ambulatory Visit

## 2014-11-23 DIAGNOSIS — Z1231 Encounter for screening mammogram for malignant neoplasm of breast: Secondary | ICD-10-CM

## 2015-11-23 ENCOUNTER — Other Ambulatory Visit: Payer: Self-pay | Admitting: Family Medicine

## 2015-11-23 DIAGNOSIS — R609 Edema, unspecified: Secondary | ICD-10-CM

## 2015-11-23 DIAGNOSIS — R52 Pain, unspecified: Secondary | ICD-10-CM

## 2015-11-30 ENCOUNTER — Ambulatory Visit
Admission: RE | Admit: 2015-11-30 | Discharge: 2015-11-30 | Disposition: A | Discharge status: Home / Self Care | Payer: Medicare Other | Source: Ambulatory Visit | Attending: Family Medicine | Admitting: Family Medicine

## 2015-11-30 DIAGNOSIS — R609 Edema, unspecified: Secondary | ICD-10-CM

## 2015-11-30 DIAGNOSIS — R52 Pain, unspecified: Secondary | ICD-10-CM

## 2015-11-30 MED ORDER — IOPAMIDOL (ISOVUE-300) INJECTION 61%
100.0000 mL | Freq: Once | INTRAVENOUS | Status: AC | PRN
  Administered 2015-11-30: 100 mL via INTRAVENOUS

## 2016-03-06 DIAGNOSIS — E785 Hyperlipidemia, unspecified: Secondary | ICD-10-CM | POA: Insufficient documentation

## 2016-03-15 ENCOUNTER — Other Ambulatory Visit: Payer: Self-pay | Admitting: Family Medicine

## 2016-03-15 DIAGNOSIS — Z1231 Encounter for screening mammogram for malignant neoplasm of breast: Secondary | ICD-10-CM

## 2016-04-19 ENCOUNTER — Ambulatory Visit
Admission: RE | Admit: 2016-04-19 | Discharge: 2016-04-19 | Disposition: A | Discharge status: Home / Self Care | Payer: Medicare Other | Source: Ambulatory Visit | Attending: Family Medicine | Admitting: Family Medicine

## 2016-04-19 DIAGNOSIS — Z1231 Encounter for screening mammogram for malignant neoplasm of breast: Secondary | ICD-10-CM

## 2016-06-06 DIAGNOSIS — F3342 Major depressive disorder, recurrent, in full remission: Secondary | ICD-10-CM | POA: Insufficient documentation

## 2016-06-06 DIAGNOSIS — K219 Gastro-esophageal reflux disease without esophagitis: Secondary | ICD-10-CM | POA: Insufficient documentation

## 2016-11-06 ENCOUNTER — Emergency Department (HOSPITAL_COMMUNITY)
Admission: EM | Admit: 2016-11-06 | Discharge: 2016-11-06 | Disposition: A | Discharge status: Home / Self Care | Payer: Medicare Other | Attending: Emergency Medicine | Admitting: Emergency Medicine

## 2016-11-06 ENCOUNTER — Emergency Department (HOSPITAL_COMMUNITY): Payer: Medicare Other

## 2016-11-06 ENCOUNTER — Encounter (HOSPITAL_COMMUNITY): Payer: Self-pay | Admitting: Emergency Medicine

## 2016-11-06 DIAGNOSIS — I10 Essential (primary) hypertension: Secondary | ICD-10-CM | POA: Diagnosis not present

## 2016-11-06 DIAGNOSIS — S32591A Other specified fracture of right pubis, initial encounter for closed fracture: Secondary | ICD-10-CM

## 2016-11-06 DIAGNOSIS — Y9389 Activity, other specified: Secondary | ICD-10-CM | POA: Diagnosis not present

## 2016-11-06 DIAGNOSIS — M797 Fibromyalgia: Secondary | ICD-10-CM | POA: Diagnosis not present

## 2016-11-06 DIAGNOSIS — Y998 Other external cause status: Secondary | ICD-10-CM | POA: Insufficient documentation

## 2016-11-06 DIAGNOSIS — Z79899 Other long term (current) drug therapy: Secondary | ICD-10-CM | POA: Diagnosis not present

## 2016-11-06 DIAGNOSIS — E785 Hyperlipidemia, unspecified: Secondary | ICD-10-CM | POA: Insufficient documentation

## 2016-11-06 DIAGNOSIS — Y929 Unspecified place or not applicable: Secondary | ICD-10-CM | POA: Insufficient documentation

## 2016-11-06 DIAGNOSIS — Z87891 Personal history of nicotine dependence: Secondary | ICD-10-CM | POA: Diagnosis not present

## 2016-11-06 DIAGNOSIS — W01198A Fall on same level from slipping, tripping and stumbling with subsequent striking against other object, initial encounter: Secondary | ICD-10-CM | POA: Insufficient documentation

## 2016-11-06 DIAGNOSIS — S79911A Unspecified injury of right hip, initial encounter: Secondary | ICD-10-CM | POA: Diagnosis present

## 2016-11-06 DIAGNOSIS — M84350A Stress fracture, pelvis, initial encounter for fracture: Secondary | ICD-10-CM | POA: Diagnosis not present

## 2016-11-06 MED ORDER — MORPHINE SULFATE (PF) 4 MG/ML IV SOLN
6.0000 mg | Freq: Once | INTRAVENOUS | Status: AC
Start: 2016-11-06 — End: 2016-11-06
  Administered 2016-11-06: 6 mg via INTRAMUSCULAR
  Filled 2016-11-06: qty 2

## 2016-11-06 MED ORDER — OXYCODONE-ACETAMINOPHEN 5-325 MG PO TABS
1.0000 | ORAL_TABLET | Freq: Once | ORAL | Status: AC
  Administered 2016-11-06: 1 via ORAL
  Filled 2016-11-06: qty 1

## 2016-11-06 NOTE — Discharge Instructions (Signed)
avoid weightbearing on the right leg. Rest. Follow-up with orthopedics doctor as referred or with your orthopedic doctor for recheck and make sure that the fracture is healing. Call your pain management to seizure medications could be adjusted. Return to emergency department if unable to manage your pain at home.

## 2016-11-06 NOTE — ED Provider Notes (Signed)
[facility] DEPT Provider Note   CSN: [id number] Arrival date & time: 11/06/16  2048     History   Chief Complaint Chief Complaint  Patient presents with  . Fall  . Hip Pain    HPI Tiffany Brown is a 71 y.o. female.  HPI Tiffany Brown is a 71 y.o. female with history of fibromyalgia, arthritis, hypertension, presents to emergency department with complaint of right hip pain. Patient states that she twisted her ankle while getting out of the car and fell onto her right hip. This occurred 5 days ago. States since then unable to bear weight on that hip, has been walking with a walker and dragging right leg takes hydrocodone 10mg  and muscle relaxant chronically for chronic pain due to fibromyalgia and arthritis. States went to PCP yesterday where she was told she may have bruised her leg. Denies weakness or numbness to the leg. No other injuries. Did not hit head or LOC.   Past Medical History:  Diagnosis Date  . Abnormal glucose   . Depression with anxiety   . Essential hypertension, benign   . Fibromyalgia   . Melanoma ([place])   . Near syncope   . Other and unspecified hyperlipidemia     Patient Active Problem List   Diagnosis Date Noted  . Precordial pain 08/20/2011  . Essential hypertension, benign   . Other and unspecified hyperlipidemia     Past Surgical History:  Procedure Laterality Date  . ABDOMINAL HYSTERECTOMY    . BREAST SURGERY     LEFT  . CHOLECYSTECTOMY    . ELBOW SURGERY     X's 3 Left  . RIGHT DISTAL RADIUS FRACTURE    . TOTAL KNEE ARTHROPLASTY     RIGHT    OB History    No data available       Home Medications    Prior to Admission medications   Medication Sig Start Date End Date Taking? Authorizing Provider  ampicillin (PRINCIPEN) 250 MG capsule Take 250 mg by mouth 3 (three) times daily.    [provider]  clobetasol cream (TEMOVATE) 8.36 % Apply 1 application topically at bedtime.    [provider]  fluticasone  (FLONASE) 50 MCG/ACT nasal spray Place 2 sprays into the nose daily as needed (allergies).     [provider]  furosemide (LASIX) 40 MG tablet Take 40 mg by mouth 2 (two) times daily.     [provider]  gabapentin (NEURONTIN) 100 MG capsule Take 100 mg by mouth 3 (three) times daily.  07/07/14   [provider]  HYDROcodone-acetaminophen [facility]) 10-325 MG per tablet  05/05/14   [provider]  HYDROcodone-acetaminophen (NORCO/VICODIN) 5-325 MG per tablet Take 1 tablet by mouth.    [provider]  losartan (COZAAR) 100 MG tablet Take 100 mg by mouth daily.    [provider]  montelukast (SINGULAIR) 10 MG tablet TAKE ONE TABLET BY MOUTH ONE TIME DAILY 05/03/14   [provider]  pravastatin (PRAVACHOL) 80 MG tablet Take 80 mg by mouth. 06/13/14 06/13/15  [provider]  pravastatin (PRAVACHOL) 80 MG tablet Take 80 mg by mouth daily.    [provider]  silver sulfADIAZINE (SILVADENE) 1 % cream Apply 1 application topically daily.  07/18/14   [provider]  venlafaxine XR (EFFEXOR-XR) 150 MG 24 hr capsule Take 150 mg by mouth 2 (two) times daily.     [provider]    Family History Family  History  Problem Relation Age of Onset  . Cancer Mother        lymphoma  . Heart disease Maternal Grandmother        no details    Social History Social History  Substance Use Topics  . Smoking status: Former Smoker    Packs/day: 0.80    Years: 25.00    Types: Cigarettes    Quit date: 05/06/1994  . Smokeless tobacco: Never Used  . Alcohol use Not on file     Allergies   Patient has no known allergies.   Review of Systems Review of Systems  Constitutional: Negative for chills and fever.  Musculoskeletal: Positive for arthralgias and gait problem. Negative for back pain and neck pain.  Neurological: Negative for weakness and numbness.  All other systems reviewed and are  negative.    Physical Exam Updated Vital Signs BP (!) 186/83 (BP Location: Right Arm)   Pulse 79   Temp 98.2 F (36.8 C) (Oral)   Resp 16   SpO2 98%   Physical Exam  Constitutional: She appears well-developed and well-nourished. No distress.  Eyes: Conjunctivae are normal.  Neck: Neck supple.  Musculoskeletal:  Tenderness to palpation over right lateral hip and right anterior groin. Pain with passive and active flexion at the hip joint. Pain with active and passive internal and external rotation of the hip. Peripheral vascular disease pattern skin discoloration to bilateral LE with varicosities and skin hyperpigmentation. DP pulses intact and equal bialterally  Neurological: She is alert.  Skin: Skin is warm and dry.  Nursing note and vitals reviewed.    ED Treatments / Results  Labs (all labs ordered are listed, but only abnormal results are displayed) Labs Reviewed - No data to display  EKG  EKG Interpretation None       Radiology Ct Pelvis Wo Contrast  Result Date: 11/06/2016 CLINICAL DATA:  Status post fall, with persistent hip and groin pain. Initial encounter. EXAM: CT PELVIS WITHOUT CONTRAST TECHNIQUE: Multidetector CT imaging of the pelvis was performed following the standard protocol without intravenous contrast. COMPARISON:  CT of the abdomen and pelvis from 11/30/2015 FINDINGS: Urinary Tract: The bladder is mildly distended and grossly remarkable. Bowel: Visualized small and large bowel loops are grossly unremarkable. The appendix is normal in caliber, without evidence of appendicitis. Vascular/Lymphatic: Scattered calcification is seen along the the distal abdominal aorta and its branches. No retroperitoneal lymphadenopathy is seen. No pelvic sidewall lymphadenopathy is identified. Reproductive: The patient is status post hysterectomy. No suspicious adnexal masses are seen. Other:  No additional soft tissue abnormalities are seen. Musculoskeletal: There is a  minimally displaced fracture of the right inferior pubic ramus. The visualized musculature is unremarkable in appearance. IMPRESSION: 1. Minimally displaced fracture of the right inferior pubic ramus. 2. Scattered aortic atherosclerosis. Electronically Signed   By: Garald Balding M.D.   On: 11/06/2016 22:39   Dg Hip Unilat W Or Wo Pelvis 2-3 Views Right  Result Date: 11/06/2016 CLINICAL DATA:  Right hip pain after fall 4 days ago. EXAM: DG HIP (WITH OR WITHOUT PELVIS) 2-3V RIGHT COMPARISON:  None. FINDINGS: Subtle nondisplaced fracture of the right inferior pubic ramus. This would imply an additional fracture involving the pelvis and is likely at the junction of the superior pubic ramus with acetabulum given subtle lucencies seen though this is less definitive. The hip joints are maintained bilaterally. There is lower lumbar degenerative disc and facet arthropathy from L4 through S1. SI joints and pubic symphysis are  maintained without diastasis. IMPRESSION: Subtle right inferior pubic ramus fracture with probable superior pubic ramus fracture at the junction of the ramus with acetabulum but this is less apparent. Electronically Signed   By: Ashley Royalty M.D.   On: 11/06/2016 21:34    Procedures Procedures (including critical care time)  Medications Ordered in ED Medications - No data to display   Initial Impression / Assessment and Plan / ED Course  I have reviewed the triage vital signs and the nursing notes.  Pertinent labs & imaging results that were available during my care of the patient were reviewed by me and considered in my medical decision making (see chart for details).     Patient with right hip pain after a fall 5 days ago. She has been ambulating with a walker but states she cannot put much weight on that leg. She is neurovascularly intact. We'll get x-rays of the right hip.   X-ray showing subtle right inferior pubic ramus fracture. Will get CT for further evaluation.   11:21  PM CT showing minimally displaced fracture of the right inferior pubic ramus. No other fractures seen. Discussed results with patient. Given option of admission for pain management for she is discharged home with assistance. The patient wanted to go home. Patient's son is at bedside and stated he would help her around the house. Patient uses a walker and has a wheelchair that she could use. Patient is already in pain management and takes 10 mg hydrocodone for pain. She will call her pain management tomorrow to see if her pain medications could be increased temporarily. Advised to her to follow-up with family doctor and orthopedic specialist. Return if worsening pain or difficulty managing at home.  Vitals:   11/06/16 2101 11/06/16 2148 11/06/16 2200 11/06/16 2230  BP: (!) 186/83 (!) 171/77 (!) 156/72 (!) 156/77  Pulse: 79 81 81 81  Resp: 16 16    Temp: 98.2 F (36.8 C)     TempSrc: Oral     SpO2: 98% 96% 92% 91%     Final Clinical Impressions(s) / ED Diagnoses   Final diagnoses:  Closed fracture of right inferior pubic ramus, initial encounter [facility])    New Prescriptions New Prescriptions   No medications on file     Jeannett Senior, PA-C 11/06/16 2325    Gareth Morgan, MD 11/10/16 2142

## 2016-11-06 NOTE — ED Triage Notes (Signed)
Patient arrives post fall on Thurs. States she has been seen by PCP. Presents tonight because right hip and groin pain is persistent and bad. Able to stand and take a couple steps to stretcher with assistance. Denies pain elsewhere in body.

## 2016-11-18 ENCOUNTER — Encounter (INDEPENDENT_AMBULATORY_CARE_PROVIDER_SITE_OTHER): Payer: Self-pay | Admitting: Orthopaedic Surgery

## 2016-11-18 ENCOUNTER — Ambulatory Visit (INDEPENDENT_AMBULATORY_CARE_PROVIDER_SITE_OTHER): Payer: Medicare Other | Admitting: Orthopaedic Surgery

## 2016-11-18 DIAGNOSIS — S32591A Other specified fracture of right pubis, initial encounter for closed fracture: Secondary | ICD-10-CM

## 2016-11-18 NOTE — Progress Notes (Signed)
The patient is a very pleasant 71 year old female who sustained a mechanical fall on June 28 when she was getting out of a car and actually slipped. After continued pain in her pelvis and her right hip area where she landed she went to the emergency room on July 4. A CT scan was obtained after plain films of the pelvis. Both of these showed inferior pubic rami fracture on the right side and a possible superior him off fracture. I have reviewed both plain films and CT scans and agree with this assessment of these are pubic rami fractures. The patient hurts mainly with weightbearing. She does have stasis dermatitis of both her legs and that becomes painful to her and a history of a right total knee replacement performed by Dr. Rhona Raider about 7 years ago.  On examination she has pain over the pubis to the right side as well as pain with range of motion of her right hip.  We went over x-rays now showed her the plain films and the CT scan and showed her the brake pedal also showed her hip model and explained what this involves. All questions were encouraged and answered. I told her to get this time she will continue to get better is easily goes by. She'll slowly increase her activities as comfort allows. She'll follow-up as needed.

## 2017-01-15 ENCOUNTER — Ambulatory Visit
Admission: RE | Admit: 2017-01-15 | Discharge: 2017-01-15 | Disposition: A | Discharge status: Home / Self Care | Payer: Medicare Other | Source: Ambulatory Visit | Attending: Physician Assistant | Admitting: Physician Assistant

## 2017-01-15 ENCOUNTER — Other Ambulatory Visit: Payer: Self-pay | Admitting: Physician Assistant

## 2017-01-15 DIAGNOSIS — M545 Low back pain: Secondary | ICD-10-CM

## 2017-01-15 DIAGNOSIS — M79606 Pain in leg, unspecified: Secondary | ICD-10-CM

## 2017-01-15 DIAGNOSIS — M47817 Spondylosis without myelopathy or radiculopathy, lumbosacral region: Secondary | ICD-10-CM

## 2017-01-15 DIAGNOSIS — M5137 Other intervertebral disc degeneration, lumbosacral region: Secondary | ICD-10-CM

## 2017-01-15 DIAGNOSIS — M51379 Other intervertebral disc degeneration, lumbosacral region without mention of lumbar back pain or lower extremity pain: Secondary | ICD-10-CM

## 2017-01-20 ENCOUNTER — Encounter (HOSPITAL_COMMUNITY): Payer: Self-pay | Admitting: *Deleted

## 2017-01-20 ENCOUNTER — Emergency Department (HOSPITAL_COMMUNITY): Payer: Medicare Other

## 2017-01-20 ENCOUNTER — Emergency Department (HOSPITAL_COMMUNITY)
Admission: EM | Admit: 2017-01-20 | Discharge: 2017-01-20 | Disposition: A | Discharge status: Home / Self Care | Payer: Medicare Other | Attending: Emergency Medicine | Admitting: Emergency Medicine

## 2017-01-20 DIAGNOSIS — Z87891 Personal history of nicotine dependence: Secondary | ICD-10-CM | POA: Diagnosis not present

## 2017-01-20 DIAGNOSIS — M25561 Pain in right knee: Secondary | ICD-10-CM | POA: Insufficient documentation

## 2017-01-20 DIAGNOSIS — G8929 Other chronic pain: Secondary | ICD-10-CM | POA: Insufficient documentation

## 2017-01-20 DIAGNOSIS — I10 Essential (primary) hypertension: Secondary | ICD-10-CM | POA: Diagnosis not present

## 2017-01-20 DIAGNOSIS — Z79899 Other long term (current) drug therapy: Secondary | ICD-10-CM | POA: Insufficient documentation

## 2017-01-20 NOTE — ED Notes (Signed)
Pt transported to and from radiology on wheelchair, tolerated well.  Pt ambulated to bathroom.

## 2017-01-20 NOTE — ED Provider Notes (Signed)
[facility] DEPT Provider Note   CSN: [id number] Arrival date & time: 01/20/17  1040     History   Chief Complaint Chief Complaint  Patient presents with  . Knee Pain    HPI Tiffany Brown is a 71 y.o. female.  HPI  Patient, with past medical history of right knee replacement 7 years ago, fibromyalgia, chronic pain, presents to ED for evaluation of right knee pain. She states that her right knee has been giving her "pain on and off" since the surgery 7 years ago. She states that she came in today because her husband told her to so that she could find some relief. She states she has been taking meloxicam, Robaxin, hydrocodone as prescribed by her pain clinic but continues to have pain. She denies any injuries or falls that could've exacerbated the pain. She has tried using a knee sleeve and icing the area with no relief in her symptoms. She denies any history of DVT or PE. She does have compression stockings and SCDs that she uses as needed nightly. She had bilateral lower extremity ultrasounds done one year ago which were both negative. She has been ambulatory. She denies any numbness, swelling, fevers, chills.  Past Medical History:  Diagnosis Date  . Abnormal glucose   . Depression with anxiety   . Essential hypertension, benign   . Fibromyalgia   . Melanoma ([place])   . Near syncope   . Other and unspecified hyperlipidemia     Patient Active Problem List   Diagnosis Date Noted  . Closed fracture of right inferior pubic ramus ([place]) 11/18/2016  . Precordial pain 08/20/2011  . Essential hypertension, benign   . Other and unspecified hyperlipidemia     Past Surgical History:  Procedure Laterality Date  . ABDOMINAL HYSTERECTOMY    . BREAST SURGERY     LEFT  . CHOLECYSTECTOMY    . ELBOW SURGERY     X's 3 Left  . RIGHT DISTAL RADIUS FRACTURE    . TOTAL KNEE ARTHROPLASTY     RIGHT    OB History    No data available       Home Medications    Prior to Admission  medications   Medication Sig Start Date End Date Taking? Authorizing Provider  fluticasone (FLONASE) 50 MCG/ACT nasal spray Place 2 sprays into the nose daily as needed (allergies).     [provider]  furosemide (LASIX) 40 MG tablet Take 40 mg by mouth 2 (two) times daily.     [provider]  HYDROcodone-acetaminophen [facility]) 10-325 MG per tablet  05/05/14   [provider]  losartan (COZAAR) 100 MG tablet Take 100 mg by mouth daily.    [provider]  meloxicam (MOBIC) 7.5 MG tablet Take 7.5 mg by mouth daily. 11/11/16   [provider]  methocarbamol (ROBAXIN) 750 MG tablet Take by mouth. 07/18/16   [provider]  pravastatin (PRAVACHOL) 80 MG tablet Take 80 mg by mouth daily.    [provider]  venlafaxine XR (EFFEXOR-XR) 150 MG 24 hr capsule Take 150 mg by mouth 2 (two) times daily.     [provider]  Vitamin D, Ergocalciferol, (DRISDOL) 50000 units CAPS capsule take 1 Capsule by mouth each week 11/05/16   [provider]    Family History Family History  Problem Relation Age of Onset  . Cancer Mother        lymphoma  . Heart disease Maternal Grandmother  no details    Social History Social History  Substance Use Topics  . Smoking status: Former Smoker    Packs/day: 0.80    Years: 25.00    Types: Cigarettes    Quit date: 05/06/1994  . Smokeless tobacco: Never Used  . Alcohol use Not on file     Allergies   Patient has no known allergies.   Review of Systems Review of Systems  Constitutional: Negative for chills and fever.  Respiratory: Negative for shortness of breath.   Cardiovascular: Negative for chest pain and leg swelling.  Gastrointestinal: Negative for diarrhea and vomiting.  Musculoskeletal: Positive for arthralgias. Negative for gait problem, joint swelling and myalgias.  Skin: Positive for color change and rash. Negative for wound.  Neurological: Negative for weakness  and numbness.     Physical Exam Updated Vital Signs BP (!) 155/91 (BP Location: Left Arm)   Pulse 75   Temp 97.9 F (36.6 C) (Oral)   Resp 16   SpO2 94%   Physical Exam  Constitutional: She appears well-developed and well-nourished. No distress.  HENT:  Head: Normocephalic and atraumatic.  Eyes: Conjunctivae and EOM are normal. No scleral icterus.  Neck: Normal range of motion.  Pulmonary/Chest: Effort normal. No respiratory distress.  Musculoskeletal: Normal range of motion. She exhibits edema and tenderness. She exhibits no deformity.  Bilateral chronic venous stasis ulcers noted to shins. Tenderness to palpation of the right knee. No visible swelling or redness of the joint noted. Full active and passive range of motion of right knee. 2+ DP pulse. Sensation intact to light touch.  Neurological: She is alert.  Skin: No rash noted. She is not diaphoretic.  Psychiatric: She has a normal mood and affect.  Nursing note and vitals reviewed.    ED Treatments / Results  Labs (all labs ordered are listed, but only abnormal results are displayed) Labs Reviewed - No data to display  EKG  EKG Interpretation None       Radiology Dg Knee 2 Views Right  Result Date: 01/20/2017 CLINICAL DATA:  Chronic right knee pain. EXAM: RIGHT KNEE - 1-2 VIEW COMPARISON:  Right knee x-rays dated October 10, 2006. FINDINGS: Postsurgical changes related to right total knee arthroplasty. No evidence of hardware loosening or failure. No fracture. The bones are osteopenic. No significant joint effusion. Soft tissues are unremarkable. IMPRESSION: Postsurgical changes related to right total knee arthroplasty without evidence of hardware complication. Electronically Signed   By: Titus Dubin M.D.   On: 01/20/2017 11:44    Procedures Procedures (including critical care time)  Medications Ordered in ED Medications - No data to display   Initial Impression / Assessment and Plan / ED Course  I have  reviewed the triage vital signs and the nursing notes.  Pertinent labs & imaging results that were available during my care of the patient were reviewed by me and considered in my medical decision making (see chart for details).     Patient presents to ED for evaluation of right knee pain. She does have a history of knee replacement 7 years ago and states that pain has been intermittent since then. She is here today because her husband told her to come here. She is currently in pain management program and takes oxycodone, meloxicam, Robaxin. She denies any additional injury or falls that have exacerbated the pain. On physical exam there are chronic venous ulcers noted in bilateral lower extremities. There is no joint swelling, redness. She has full active and passive  range of motion of the knee which makes me have low suspicion for septic joint being the cause of her joint pain. X-ray returned as negative for changes and hardware or other acute abnormality. Patient states she has not seen her orthopedist in about 2-3 years. I encouraged her to continue her home medications and to follow-up with her orthopedist as soon as possible for further evaluation. Patient remains ambulatory here in the ED. Strict return precautions given. Patient discussed with and seen by Dr. Rex Kras.  Final Clinical Impressions(s) / ED Diagnoses   Final diagnoses:  Chronic pain of right knee    New Prescriptions New Prescriptions   No medications on file     Delia Heady, PA-C 01/20/17 [place], Wenda Overland, MD 01/20/17 1710

## 2017-01-20 NOTE — Discharge Instructions (Signed)
Please read attached information regarding your condition. Continue home medications as previously prescribed. Follow-up with your orthopedist for further evaluation. Return to ED for worsening pain, swelling of joint, trouble moving joint, falls, injuries.

## 2017-01-20 NOTE — ED Triage Notes (Signed)
To ED for eval of right knee pain. Replacement 7 yrs ago. Pain since but worse over the past couple of days. Per family, pt is unable to sleep at night or walk without crying. Pt is seen at pain clinic due to chronic pain. No trauma noted

## 2017-02-04 ENCOUNTER — Ambulatory Visit: Payer: Medicare Other | Attending: Orthopaedic Surgery | Admitting: Physical Therapy

## 2017-02-04 DIAGNOSIS — M25561 Pain in right knee: Secondary | ICD-10-CM | POA: Diagnosis present

## 2017-02-04 DIAGNOSIS — G8929 Other chronic pain: Secondary | ICD-10-CM | POA: Diagnosis present

## 2017-02-04 DIAGNOSIS — M25661 Stiffness of right knee, not elsewhere classified: Secondary | ICD-10-CM | POA: Insufficient documentation

## 2017-02-04 NOTE — Therapy (Signed)
[facility] [place], [place], [address] Phone: [phone]   Fax:  [phone]  Physical Therapy Evaluation  Patient Details  Name: Tiffany Brown MRN: [id number] Date of Birth: 01/19/1946 Referring Provider: Melrose Nakayama MD  Encounter Date: 02/04/2017      PT End of Session - 02/04/17 1333    Visit Number 1   Number of Visits 16   Date for PT Re-Evaluation 04/05/17   PT Start Time 1030   PT Stop Time 1121   PT Time Calculation (min) 51 min   Activity Tolerance Patient tolerated treatment well   Behavior During Therapy [facility] for tasks assessed/performed      Past Medical History:  Diagnosis Date  . Abnormal glucose   . Depression with anxiety   . Essential hypertension, benign   . Fibromyalgia   . Melanoma ([place])   . Near syncope   . Other and unspecified hyperlipidemia     Past Surgical History:  Procedure Laterality Date  . ABDOMINAL HYSTERECTOMY    . BREAST SURGERY     LEFT  . CHOLECYSTECTOMY    . ELBOW SURGERY     X's 3 Left  . RIGHT DISTAL RADIUS FRACTURE    . TOTAL KNEE ARTHROPLASTY     RIGHT    There were no vitals filed for this visit.       Subjective Assessment - 02/04/17 1505    Subjective The patient reports ongoing right knee pain.  An X-ray revealed her component from her TKA performed years ago are still intact.  Her pain is a 6/10 and increases with prolonged sitting..  Pain decreases with ice and walking.   Pertinent History Pelvic fracture.   How long can you walk comfortably? Short community distances.   Patient Stated Goals Reduce this knee pain.   Pain Score 6    Pain Location Knee   Pain Orientation Right   Pain Descriptors / Indicators Aching   Pain Type Chronic pain            [facility] PT Assessment - 02/04/17 0001      Assessment   Medical Diagnosis Right distal hamstring strain and pes bursitis.   Referring Provider Melrose Nakayama MD   Onset Date/Surgical Date  --  Several years.     Precautions   Precautions --  No ultrasound.     Restrictions   Weight Bearing Restrictions No     Balance Screen   Has the patient fallen in the past 6 months Yes   How many times? --  3.   Has the patient had a decrease in activity level because of a fear of falling?  Yes   Is the patient reluctant to leave their home because of a fear of falling?  Yes     [place] residence     Prior Function   Level of Independence Independent     ROM / Strength   AROM / PROM / Strength AROM;Strength     AROM   Overall AROM Comments Right knee -5 to 93 degrees.     Strength   Overall Strength Comments Right hip strength into flexion and abduction= 3+ to 4-/5.     Palpation   Palpation comment Tender to palpation over distal medial right hamstrings and Pes Anserine region.     Ambulation/Gait   Gait Comments Antalgic gait pattern.            Objective  measurements completed on examination: See above findings.          [facility] Adult PT Treatment/Exercise - 02/04/17 0001      Modalities   Modalities Electrical Stimulation     Electrical Stimulation   Electrical Stimulation Location Right medial distal hamstrings and Pes Anserine area.   Electrical Stimulation Action Pre-mod.   Electrical Stimulation Parameters 80-150 Hz x 20 minutes.   Electrical Stimulation Goals Pain                  PT Short Term Goals - 02/04/17 1629      PT SHORT TERM GOAL #1   Title STG's=LTG's.           PT Long Term Goals - 02/04/17 1630      PT LONG TERM GOAL #1   Title Independent with a HEP.   Time 8   Period Weeks   Status New     PT LONG TERM GOAL #2   Title Sit 30 minutes with right knee pain not > 4/10.   Time 8   Period Weeks   Status New     PT LONG TERM GOAL #3   Title Perform ADL's with pain not > 4/10.   Time 8   Period Weeks   Status New                Plan - 02/04/17 1625     Clinical Impression Statement The patient presents to OPPT with c/o right knee pain over the last several years.  She has palpable pain over her right distal medial hamstring region and Pes anserine area.  Her right hip is weak and she has limited right knee AROM.  Her functional mobility is impaired.  Patient will benefit from skilled physical therapy to address deficits.   History and Personal Factors relevant to plan of care: Pelvic fracture; Right TKA.   Clinical Presentation Stable   Clinical Decision Making Low   Rehab Potential Good   Clinical Impairments Affecting Rehab Potential Chronicity.   PT Frequency 2x / week   PT Duration 8 weeks   PT Treatment/Interventions ADLs/Self Care Home Management;Cryotherapy;Electrical Stimulation;Iontophoresis 4mg /ml Dexamethasone;Therapeutic exercise;Therapeutic activities;Patient/family education;Manual techniques;Vasopneumatic Device   PT Next Visit Plan [place]; IASTM; electrical stimulation.  Pain-free right hip and knee exercises.   Consulted and Agree with Plan of Care Patient      Patient will benefit from skilled therapeutic intervention in order to improve the following deficits and impairments:  Abnormal gait, Decreased activity tolerance, Decreased mobility, Decreased range of motion, Decreased strength, Pain  Visit Diagnosis: Chronic pain of right knee - Plan: PT plan of care cert/re-cert  Stiffness of right knee, not elsewhere classified - Plan: PT plan of care cert/re-cert      G-Codes - 35/57/32 1504    Functional Assessment Tool Used (Outpatient Only) FOTO.Marland Kitchen65% limitation.   Functional Limitation Mobility: Walking and moving around   Mobility: Walking and Moving Around Current Status [phone]) At least 60 percent but less than 80 percent impaired, limited or restricted   Mobility: Walking and Moving Around Goal Status [phone]) At least 20 percent but less than 40 percent impaired, limited or restricted       Problem  List Patient Active Problem List   Diagnosis Date Noted  . Closed fracture of right inferior pubic ramus ([place]) 11/18/2016  . Precordial pain 08/20/2011  . Essential hypertension, benign   . Other and unspecified hyperlipidemia     Demetris Meinhardt, [place] MPT  02/04/2017, 4:34 PM  [facility] [place], [place], [address] Phone: [phone]   Fax:  [phone]  Name: Tiffany Brown MRN: [id number] Date of Birth: 09-Jun-1945

## 2017-02-07 ENCOUNTER — Ambulatory Visit: Payer: Medicare Other | Admitting: Physical Therapy

## 2017-02-07 DIAGNOSIS — M25561 Pain in right knee: Principal | ICD-10-CM

## 2017-02-07 DIAGNOSIS — G8929 Other chronic pain: Secondary | ICD-10-CM

## 2017-02-07 DIAGNOSIS — M25661 Stiffness of right knee, not elsewhere classified: Secondary | ICD-10-CM

## 2017-02-07 NOTE — Therapy (Signed)
[facility] [place], [place], [address] Phone: [phone]   Fax:  [phone]  Physical Therapy Treatment  Patient Details  Name: Tiffany Brown MRN: [id number] Date of Birth: [birth year] Referring Provider: Melrose Nakayama MD  Encounter Date: 02/07/2017      PT End of Session - 02/07/17 1019    Visit Number 2   Number of Visits 16   Date for PT Re-Evaluation 04/05/17   PT Start Time 1018   PT Stop Time 1120   PT Time Calculation (min) 62 min   Activity Tolerance Patient tolerated treatment well   Behavior During Therapy [facility] for tasks assessed/performed      Past Medical History:  Diagnosis Date  . Abnormal glucose   . Depression with anxiety   . Essential hypertension, benign   . Fibromyalgia   . Melanoma ([place])   . Near syncope   . Other and unspecified hyperlipidemia     Past Surgical History:  Procedure Laterality Date  . ABDOMINAL HYSTERECTOMY    . BREAST SURGERY     LEFT  . CHOLECYSTECTOMY    . ELBOW SURGERY     X's 3 Left  . RIGHT DISTAL RADIUS FRACTURE    . TOTAL KNEE ARTHROPLASTY     RIGHT    There were no vitals filed for this visit.      Subjective Assessment - 02/07/17 1020    Subjective Patient reports her knee is a little better since last visit however this morning has been a little tough.    Pertinent History Pelvic fracture June 2018   How long can you walk comfortably? Short community distances.   Patient Stated Goals Reduce this knee pain.   Currently in Pain? Yes   Pain Score 5    Pain Location Knee   Pain Orientation Right   Pain Descriptors / Indicators Aching                         [facility] Adult PT Treatment/Exercise - 02/07/17 0001      Exercises   Exercises Knee/Hip     Knee/Hip Exercises: Stretches   Passive Hamstring Stretch Right;1 rep;60 seconds   Other Knee/Hip Stretches butterfly stretch for R hip ADDuctors x 30 seconds     Knee/Hip  Exercises: Aerobic   Nustep L5 x 13 min     Knee/Hip Exercises: Standing   Forward Lunges Left;1 set;15 reps  for stretch of R hip flexor   Forward Step Up Both;2 sets;10 reps;Step Height: 6";Hand Hold: 2   Forward Step Up Limitations not step up but toe tap     Knee/Hip Exercises: Seated   Abduction/Adduction  Strengthening;Right;3 sets;10 reps   Abd/Adduction Limitations Red band; last set with 5 second hold     Modalities   Modalities Electrical Stimulation     Electrical Stimulation   Electrical Stimulation Location R hip ADD and pes anserine premod 80-150 Hz x 15 min   Electrical Stimulation Goals Pain     Manual Therapy   Manual Therapy Soft tissue mobilization   Soft tissue mobilization to R hip ADDuctors and distal medial HS                PT Education - 02/07/17 1111    Education provided Yes   Education Details HEP   Person(s) Educated Patient   Methods Explanation;Demonstration;Verbal cues;Handout   Comprehension Verbalized understanding;Returned demonstration  PT Short Term Goals - 02/04/17 1629      PT SHORT TERM GOAL #1   Title STG's=LTG's.           PT Long Term Goals - 02/04/17 1630      PT LONG TERM GOAL #1   Title Independent with a HEP.   Time 8   Period Weeks   Status New     PT LONG TERM GOAL #2   Title Sit 30 minutes with right knee pain not > 4/10.   Time 8   Period Weeks   Status New     PT LONG TERM GOAL #3   Title Perform ADL's with pain not > 4/10.   Time 8   Period Weeks   Status New               Plan - 02/07/17 1111    Clinical Impression Statement Patient did very well with strengthening today with minimal c/o pain. She has tight and HS, HF and hip Adductors on the right.    PT Treatment/Interventions ADLs/Self Care Home Management;Cryotherapy;Electrical Stimulation;Iontophoresis 4mg /ml Dexamethasone;Therapeutic exercise;Therapeutic activities;Patient/family education;Manual  techniques;Vasopneumatic Device   PT Next Visit Plan [place]; IASTM; electrical stimulation.  Pain-free right hip and knee exercises.  Add Butterfly stretch to HEP.   PT Home Exercise Plan sit to stand, HS stretch, standing lunge for HF stretch      Patient will benefit from skilled therapeutic intervention in order to improve the following deficits and impairments:  Abnormal gait, Decreased activity tolerance, Decreased mobility, Decreased range of motion, Decreased strength, Pain  Visit Diagnosis: Chronic pain of right knee  Stiffness of right knee, not elsewhere classified     Problem List Patient Active Problem List   Diagnosis Date Noted  . Closed fracture of right inferior pubic ramus ([place]) 11/18/2016  . Precordial pain 08/20/2011  . Essential hypertension, benign   . Other and unspecified hyperlipidemia    Madelyn Flavors PT 02/07/2017, 11:17 AM  [facility] [address] [place], [place], [address] Phone: [phone]   Fax:  [phone]  Name: SHERINA STAMMER MRN: [id number] Date of Birth: 16-Feb-1946

## 2017-02-07 NOTE — Patient Instructions (Signed)
   Tiffany Brown, PT 02/07/17 11:11 AM; [facility] [place], [place], [address] Phone: [phone]   Fax:  [phone]

## 2017-02-11 ENCOUNTER — Ambulatory Visit: Payer: Medicare Other | Admitting: *Deleted

## 2017-02-11 DIAGNOSIS — G8929 Other chronic pain: Secondary | ICD-10-CM

## 2017-02-11 DIAGNOSIS — M25561 Pain in right knee: Secondary | ICD-10-CM | POA: Diagnosis not present

## 2017-02-11 DIAGNOSIS — M25661 Stiffness of right knee, not elsewhere classified: Secondary | ICD-10-CM

## 2017-02-11 NOTE — Therapy (Signed)
[facility] [place], [place], [address] Phone: [phone]   Fax:  [phone]  Physical Therapy Treatment  Patient Details  Name: Tiffany Brown MRN: [id number] Date of Birth: 1946/03/02 Referring Provider: Melrose Nakayama MD  Encounter Date: 02/11/2017      PT End of Session - 02/11/17 1037    Visit Number 3   Number of Visits 16   Date for PT Re-Evaluation 04/05/17   PT Start Time 1030   PT Stop Time 1120   PT Time Calculation (min) 50 min      Past Medical History:  Diagnosis Date  . Abnormal glucose   . Depression with anxiety   . Essential hypertension, benign   . Fibromyalgia   . Melanoma ([place])   . Near syncope   . Other and unspecified hyperlipidemia     Past Surgical History:  Procedure Laterality Date  . ABDOMINAL HYSTERECTOMY    . BREAST SURGERY     LEFT  . CHOLECYSTECTOMY    . ELBOW SURGERY     X's 3 Left  . RIGHT DISTAL RADIUS FRACTURE    . TOTAL KNEE ARTHROPLASTY     RIGHT    There were no vitals filed for this visit.      Subjective Assessment - 02/11/17 1034    Pertinent History Pelvic fracture June 2018   How long can you walk comfortably? Short community distances.   Patient Stated Goals Reduce this knee pain.   Currently in Pain? Yes   Pain Score 4    Pain Location Knee   Pain Orientation Right   Pain Descriptors / Indicators Aching   Pain Type Chronic pain                         [facility] Adult PT Treatment/Exercise - 02/11/17 0001      Exercises   Exercises Knee/Hip     Knee/Hip Exercises: Aerobic   Nustep L4 x 15 min     Knee/Hip Exercises: Standing   Forward Lunges Right;10 reps  for stretch of R hip flexor   hold 20 secs   Rocker Board 3 minutes  calf stretching   Other Standing Knee Exercises HS curl 2x10     Modalities   Modalities Electrical Stimulation;Moist Heat     Moist Heat Therapy   Number Minutes Moist Heat 15 Minutes   Moist  Heat Location Knee     Electrical Stimulation   Electrical Stimulation Location R hip ADD and pes anserine premod 80-150 Hz x 15 min   Electrical Stimulation Goals Pain     Manual Therapy   Manual Therapy Soft tissue mobilization   Soft tissue mobilization to R hip ADDuctors and distal medial HS                  PT Short Term Goals - 02/04/17 1629      PT SHORT TERM GOAL #1   Title STG's=LTG's.           PT Long Term Goals - 02/04/17 1630      PT LONG TERM GOAL #1   Title Independent with a HEP.   Time 8   Period Weeks   Status New     PT LONG TERM GOAL #2   Title Sit 30 minutes with right knee pain not > 4/10.   Time 8   Period Weeks   Status New     PT LONG  TERM GOAL #3   Title Perform ADL's with pain not > 4/10.   Time 8   Period Weeks   Status New               Plan - 02/11/17 1047    Clinical Impression Statement Pt arrived to clinic today doing fair. She feels that her Glori Bickers is making her hurt all over today6/10, but her RT knee is 3-4/10. She did fairly well with Therex today with minimal increase in pain. She was still very tender to palpation during STW at [place] and pes-anserine. . Decreased pain after RX.   Clinical Presentation Stable   Clinical Decision Making Low   Clinical Impairments Affecting Rehab Potential Chronicity.   PT Frequency 2x / week   PT Duration 8 weeks   PT Treatment/Interventions ADLs/Self Care Home Management;Cryotherapy;Electrical Stimulation;Iontophoresis 4mg /ml Dexamethasone;Therapeutic exercise;Therapeutic activities;Patient/family education;Manual techniques;Vasopneumatic Device   PT Next Visit Plan Ionto; IASTM; electrical stimulation.  Pain-free right hip and knee exercises.  Add Butterfly stretch to HEP.   PT Home Exercise Plan sit to stand, HS stretch, standing lunge for HF stretch   Consulted and Agree with Plan of Care Patient      Patient will benefit from skilled therapeutic intervention in  order to improve the following deficits and impairments:  Abnormal gait, Decreased activity tolerance, Decreased mobility, Decreased range of motion, Decreased strength, Pain  Visit Diagnosis: Chronic pain of right knee  Stiffness of right knee, not elsewhere classified     Problem List Patient Active Problem List   Diagnosis Date Noted  . Closed fracture of right inferior pubic ramus ([place]) 11/18/2016  . Precordial pain 08/20/2011  . Essential hypertension, benign   . Other and unspecified hyperlipidemia     Gurtha Picker,CHRIS, PTA 02/11/2017, 12:04 PM  [facility] [address] [place], [place], [address] Phone: [phone]   Fax:  [phone]  Name: AREEBAH MEINDERS MRN: [id number] Date of Birth: 1946-04-04

## 2017-02-14 ENCOUNTER — Encounter: Payer: Medicare Other | Admitting: *Deleted

## 2017-02-18 ENCOUNTER — Ambulatory Visit: Payer: Medicare Other | Admitting: *Deleted

## 2017-02-18 DIAGNOSIS — M25561 Pain in right knee: Secondary | ICD-10-CM | POA: Diagnosis not present

## 2017-02-18 DIAGNOSIS — G8929 Other chronic pain: Secondary | ICD-10-CM

## 2017-02-18 DIAGNOSIS — M25661 Stiffness of right knee, not elsewhere classified: Secondary | ICD-10-CM

## 2017-02-18 NOTE — Therapy (Signed)
[facility] [place], [place], [address] Phone: [phone]   Fax:  [phone]  Physical Therapy Treatment  Patient Details  Name: KYLEEN VILLATORO MRN: [id number] Date of Birth: [birth year] Referring Provider: Melrose Nakayama MD  Encounter Date: 02/18/2017      PT End of Session - 02/18/17 1140    Visit Number 4   Number of Visits 16   Date for PT Re-Evaluation 04/05/17   PT Start Time 1115   PT Stop Time 1207   PT Time Calculation (min) 52 min      Past Medical History:  Diagnosis Date  . Abnormal glucose   . Depression with anxiety   . Essential hypertension, benign   . Fibromyalgia   . Melanoma ([place])   . Near syncope   . Other and unspecified hyperlipidemia     Past Surgical History:  Procedure Laterality Date  . ABDOMINAL HYSTERECTOMY    . BREAST SURGERY     LEFT  . CHOLECYSTECTOMY    . ELBOW SURGERY     X's 3 Left  . RIGHT DISTAL RADIUS FRACTURE    . TOTAL KNEE ARTHROPLASTY     RIGHT    There were no vitals filed for this visit.      Subjective Assessment - 02/18/17 1139    Subjective Patient reports her knee is about 25% better since coming to PT   Pertinent History Pelvic fracture June 2018   How long can you walk comfortably? Short community distances.   Patient Stated Goals Reduce this knee pain.   Currently in Pain? Yes   Pain Score 3    Pain Descriptors / Indicators Aching   Pain Type Chronic pain                         [facility] Adult PT Treatment/Exercise - 02/18/17 0001      Exercises   Exercises Knee/Hip     Knee/Hip Exercises: Aerobic   Nustep L4 x 15 min     Knee/Hip Exercises: Standing   Rocker Board 3 minutes  calf stretching     Modalities   Modalities (P)  Electrical Stimulation;Moist Heat     Moist Heat Therapy   Number Minutes Moist Heat (P)  15 Minutes   Moist Heat Location (P)  Knee     Electrical Stimulation   Electrical Stimulation  Location (P)  R hip ADD and pes anserine premod 80-150 Hz x 15 min   Electrical Stimulation Goals (P)  Pain     Manual Therapy   Manual Therapy (P)  Soft tissue mobilization   Soft tissue mobilization (P)  to R hip ADDuctors and distal medial HS                  PT Short Term Goals - 02/04/17 1629      PT SHORT TERM GOAL #1   Title STG's=LTG's.           PT Long Term Goals - 02/04/17 1630      PT LONG TERM GOAL #1   Title Independent with a HEP.   Time 8   Period Weeks   Status New     PT LONG TERM GOAL #2   Title Sit 30 minutes with right knee pain not > 4/10.   Time 8   Period Weeks   Status New     PT LONG TERM GOAL #3   Title Perform ADL's  with pain not > 4/10.   Time 8   Period Weeks   Status New               Plan - 02/18/17 1445    Clinical Impression Statement Pt arrived  to clinic today doing fairly welll and feels that RT knee is doing a litlle better with less pain and she is able to sleep better now. She did well with Rx , but is still sore along medial HS's and pes anserine area. Normal response to modalities.   Rehab Potential Good   Clinical Impairments Affecting Rehab Potential Chronicity.   PT Frequency 2x / week   PT Duration 8 weeks   PT Treatment/Interventions ADLs/Self Care Home Management;Cryotherapy;Electrical Stimulation;Iontophoresis 4mg /ml Dexamethasone;Therapeutic exercise;Therapeutic activities;Patient/family education;Manual techniques;Vasopneumatic Device   PT Next Visit Plan Ionto; IASTM; electrical stimulation.  Pain-free right hip and knee exercises.  Add Butterfly stretch to HEP.   PT Home Exercise Plan sit to stand, HS stretch, standing lunge for HF stretch   Consulted and Agree with Plan of Care Patient      Patient will benefit from skilled therapeutic intervention in order to improve the following deficits and impairments:  Abnormal gait, Decreased activity tolerance, Decreased mobility, Decreased range of  motion, Decreased strength, Pain  Visit Diagnosis: Chronic pain of right knee  Stiffness of right knee, not elsewhere classified     Problem List Patient Active Problem List   Diagnosis Date Noted  . Closed fracture of right inferior pubic ramus ([place]) 11/18/2016  . Precordial pain 08/20/2011  . Essential hypertension, benign   . Other and unspecified hyperlipidemia     RAMSEUR,CHRIS, PTA 02/18/2017, 3:17 PM  [facility] [place], [place], [address] Phone: [phone]   Fax:  [phone]  Name: TIEA MANNINEN MRN: [id number] Date of Birth: [birth year]

## 2017-02-21 ENCOUNTER — Ambulatory Visit: Payer: Medicare Other | Admitting: Physical Therapy

## 2017-02-21 ENCOUNTER — Encounter: Payer: Self-pay | Admitting: Physical Therapy

## 2017-02-21 DIAGNOSIS — M25561 Pain in right knee: Secondary | ICD-10-CM | POA: Diagnosis not present

## 2017-02-21 DIAGNOSIS — G8929 Other chronic pain: Secondary | ICD-10-CM

## 2017-02-21 DIAGNOSIS — M25661 Stiffness of right knee, not elsewhere classified: Secondary | ICD-10-CM

## 2017-02-21 NOTE — Therapy (Signed)
[facility] [place], [place], [address] Phone: [phone]   Fax:  [phone]  Physical Therapy Treatment  Patient Details  Name: Tiffany Brown MRN: [id number] Date of Birth: January 02, 1946 Referring Provider: Melrose Nakayama MD  Encounter Date: 02/21/2017      PT End of Session - 02/21/17 0853    Visit Number 5   Number of Visits 16   Date for PT Re-Evaluation 04/05/17   PT Start Time 0905   PT Stop Time 0959   PT Time Calculation (min) 54 min   Activity Tolerance Patient tolerated treatment well   Behavior During Therapy [facility] for tasks assessed/performed      Past Medical History:  Diagnosis Date  . Abnormal glucose   . Depression with anxiety   . Essential hypertension, benign   . Fibromyalgia   . Melanoma ([place])   . Near syncope   . Other and unspecified hyperlipidemia     Past Surgical History:  Procedure Laterality Date  . ABDOMINAL HYSTERECTOMY    . BREAST SURGERY     LEFT  . CHOLECYSTECTOMY    . ELBOW SURGERY     X's 3 Left  . RIGHT DISTAL RADIUS FRACTURE    . TOTAL KNEE ARTHROPLASTY     RIGHT    There were no vitals filed for this visit.      Subjective Assessment - 02/21/17 0853    Subjective Reports that the R hurts worse than the L knee. Reports she was up most of the night with knee pain but walking would help ease it. Reports fibromyalgia does not help with knee pain.   Pertinent History Pelvic fracture June 2018   How long can you walk comfortably? Short community distances.   Patient Stated Goals Reduce this knee pain.   Currently in Pain? Yes   Pain Score 5    Pain Location Knee   Pain Orientation Left;Right  R > L   Pain Descriptors / Indicators Constant;Aching   Pain Type Chronic pain            [facility] PT Assessment - 02/21/17 0001      Assessment   Medical Diagnosis Right distal hamstring strain and pes bursitis.   Next MD Visit 03/12/2017     Restrictions   Weight  Bearing Restrictions No                     [facility] Adult PT Treatment/Exercise - 02/21/17 0001      Knee/Hip Exercises: Stretches   Passive Hamstring Stretch Right;3 reps;30 seconds   Other Knee/Hip Stretches R passive ITB stretch 3x30 sec     Knee/Hip Exercises: Aerobic   Nustep L5 x15 min     Knee/Hip Exercises: Standing   Rocker Board 4 minutes     Modalities   Modalities Electrical Stimulation;Moist Heat     Moist Heat Therapy   Number Minutes Moist Heat 15 Minutes   Moist Heat Location Knee     Electrical Stimulation   Electrical Stimulation Location R distal hip adductor/ITB   Electrical Stimulation Action Pre-Mod   Electrical Stimulation Parameters 80-150 hz x15 min   Electrical Stimulation Goals Pain;Tone     Manual Therapy   Manual Therapy Soft tissue mobilization   Soft tissue mobilization STW to R distal hip adductors, ITB, HS to reduce tone and pain; R patella mobilizations in medi/lat, sup/inf directions to promote proper mobility  PT Short Term Goals - 02/04/17 1629      PT SHORT TERM GOAL #1   Title STG's=LTG's.           PT Long Term Goals - 02/04/17 1630      PT LONG TERM GOAL #1   Title Independent with a HEP.   Time 8   Period Weeks   Status New     PT LONG TERM GOAL #2   Title Sit 30 minutes with right knee pain not > 4/10.   Time 8   Period Weeks   Status New     PT LONG TERM GOAL #3   Title Perform ADL's with pain not > 4/10.   Time 8   Period Weeks   Status New               Plan - 02/21/17 1308    Clinical Impression Statement Patient arrived to treatment with B knee pain although R > L. Patient attributes pain to fibromyalgia and to recent cold weather. No complaints of any increased pain with Nustep or standing calf stretching on rockerboard. Increased sensitivity to palpation reported along R distal ITB and HS. Passive R ITB and HS stretches completed today as well. Normal  modalities response noted following removal of the modalities.    Rehab Potential Good   Clinical Impairments Affecting Rehab Potential Chronicity.   PT Frequency 2x / week   PT Duration 8 weeks   PT Treatment/Interventions ADLs/Self Care Home Management;Cryotherapy;Electrical Stimulation;Iontophoresis 4mg /ml Dexamethasone;Therapeutic exercise;Therapeutic activities;Patient/family education;Manual techniques;Vasopneumatic Device   PT Next Visit Plan Ionto; IASTM; electrical stimulation.  Pain-free right hip and knee exercises.  Add Butterfly stretch to HEP.   PT Home Exercise Plan sit to stand, HS stretch, standing lunge for HF stretch   Consulted and Agree with Plan of Care Patient      Patient will benefit from skilled therapeutic intervention in order to improve the following deficits and impairments:  Abnormal gait, Decreased activity tolerance, Decreased mobility, Decreased range of motion, Decreased strength, Pain  Visit Diagnosis: Chronic pain of right knee  Stiffness of right knee, not elsewhere classified     Problem List Patient Active Problem List   Diagnosis Date Noted  . Closed fracture of right inferior pubic ramus ([place]) 11/18/2016  . Precordial pain 08/20/2011  . Essential hypertension, benign   . Other and unspecified hyperlipidemia     Wynelle Fanny, PTA 02/21/2017, 10:08 AM  [facility] [place], [place], [address] Phone: [phone]   Fax:  [phone]  Name: Tiffany Brown MRN: [id number] Date of Birth: 12-27-1945

## 2017-02-25 ENCOUNTER — Encounter: Payer: Self-pay | Admitting: Physical Therapy

## 2017-02-25 ENCOUNTER — Ambulatory Visit: Payer: Medicare Other | Admitting: Physical Therapy

## 2017-02-25 DIAGNOSIS — M25661 Stiffness of right knee, not elsewhere classified: Secondary | ICD-10-CM

## 2017-02-25 DIAGNOSIS — M25561 Pain in right knee: Principal | ICD-10-CM

## 2017-02-25 DIAGNOSIS — G8929 Other chronic pain: Secondary | ICD-10-CM

## 2017-02-25 NOTE — Therapy (Signed)
[facility] [place], [place], [address] Phone: [phone]   Fax:  [phone]  Physical Therapy Treatment  Patient Details  Name: Tiffany Brown MRN: [id number] Date of Birth: June 22, 1945 Referring Provider: Melrose Nakayama MD  Encounter Date: 02/25/2017      PT End of Session - 02/25/17 1230    Visit Number 6   Number of Visits 16   Date for PT Re-Evaluation 04/05/17   PT Start Time 1115   PT Stop Time 1215   PT Time Calculation (min) 60 min   Activity Tolerance Patient tolerated treatment well   Behavior During Therapy [facility] for tasks assessed/performed      Past Medical History:  Diagnosis Date  . Abnormal glucose   . Depression with anxiety   . Essential hypertension, benign   . Fibromyalgia   . Melanoma ([place])   . Near syncope   . Other and unspecified hyperlipidemia     Past Surgical History:  Procedure Laterality Date  . ABDOMINAL HYSTERECTOMY    . BREAST SURGERY     LEFT  . CHOLECYSTECTOMY    . ELBOW SURGERY     X's 3 Left  . RIGHT DISTAL RADIUS FRACTURE    . TOTAL KNEE ARTHROPLASTY     RIGHT    There were no vitals filed for this visit.                       [facility] Adult PT Treatment/Exercise - 02/25/17 0001      Exercises   Exercises Knee/Hip     Knee/Hip Exercises: Stretches   Passive Hamstring Stretch Right;3 reps;30 seconds   Other Knee/Hip Stretches R passive ITB stretch 3x30 sec     Knee/Hip Exercises: Aerobic   Nustep L5 x15 min     Knee/Hip Exercises: Standing   Forward Lunges Right;1 set;10 reps   Forward Step Up Both;1 set;15 reps;Hand Hold: 2   Rocker Board 3 minutes     Modalities   Modalities Electrical Stimulation     Moist Heat Therapy   Number Minutes Moist Heat 15 Minutes   Moist Heat Location Knee     Electrical Stimulation   Electrical Stimulation Location R distal hip adductors and IT band   Electrical Stimulation Action pre mod   Electrical Stimulation Parameters 80-150 Hz x 15 minutes   Electrical Stimulation Goals Tone;Pain     Manual Therapy   Manual Therapy Soft tissue mobilization   Soft tissue mobilization STW to R distal hip adductors, ITB, HS to reduce tone and pain; R patella mobilizations in medi/lat, sup/inf directions to promote proper mobility                PT Education - 02/25/17 1230    Education provided Yes   Education Details verbally reviewed HEP   Person(s) Educated Patient   Methods Explanation   Comprehension Verbalized understanding          PT Short Term Goals - 02/04/17 1629      PT SHORT TERM GOAL #1   Title STG's=LTG's.           PT Long Term Goals - 02/25/17 1239      PT LONG TERM GOAL #1   Title Independent with a HEP.   Time 8   Period Weeks   Status New     PT LONG TERM GOAL #2   Title Sit 30 minutes with right knee pain not > 4/10.  Time 8   Period Weeks   Status On-going     PT LONG TERM GOAL #3   Title Perform ADL's with pain not > 4/10.   Period Weeks   Status New               Plan - 02/25/17 1231    Clinical Impression Statement Pt arriving today reporting right knee pain of 3/10. Pt reporting stiffness in both knee intermittenly. pt tolerating treatment well.  pt still with tenderness to palpation of medial hamstring tendons and distal IT band. continue with skilled PT as pt tolerates.    Rehab Potential Good   Clinical Impairments Affecting Rehab Potential Chronicity.   PT Frequency 2x / week   PT Duration 8 weeks   PT Treatment/Interventions ADLs/Self Care Home Management;Cryotherapy;Electrical Stimulation;Iontophoresis 4mg /ml Dexamethasone;Therapeutic exercise;Therapeutic activities;Patient/family education;Manual techniques;Vasopneumatic Device   PT Next Visit Plan Ionto; IASTM; electrical stimulation.  Pain-free right hip and knee exercises.  Add Butterfly stretch to HEP.   PT Home Exercise Plan sit to stand, HS stretch,  standing lunge for HF stretch   Consulted and Agree with Plan of Care Patient      Patient will benefit from skilled therapeutic intervention in order to improve the following deficits and impairments:  Abnormal gait, Decreased activity tolerance, Decreased mobility, Decreased range of motion, Decreased strength, Pain  Visit Diagnosis: Chronic pain of right knee  Stiffness of right knee, not elsewhere classified     Problem List Patient Active Problem List   Diagnosis Date Noted  . Closed fracture of right inferior pubic ramus ([place]) 11/18/2016  . Precordial pain 08/20/2011  . Essential hypertension, benign   . Other and unspecified hyperlipidemia     Oretha Caprice, MPT 02/25/2017, 12:42 PM  [facility] [place], [place], [address] Phone: [phone]   Fax:  [phone]  Name: Tiffany Brown MRN: [id number] Date of Birth: [birth year]

## 2017-02-26 ENCOUNTER — Encounter: Payer: Self-pay | Admitting: Physical Therapy

## 2017-02-26 ENCOUNTER — Ambulatory Visit: Payer: Medicare Other | Admitting: Physical Therapy

## 2017-02-26 DIAGNOSIS — M25561 Pain in right knee: Secondary | ICD-10-CM | POA: Diagnosis not present

## 2017-02-26 DIAGNOSIS — G8929 Other chronic pain: Secondary | ICD-10-CM

## 2017-02-26 DIAGNOSIS — M25661 Stiffness of right knee, not elsewhere classified: Secondary | ICD-10-CM

## 2017-02-26 NOTE — Therapy (Signed)
[facility] [place], [place], [address] Phone: [phone]   Fax:  [phone]  Physical Therapy Treatment  Patient Details  Name: Tiffany Brown MRN: [id number] Date of Birth: 05-18-1945 Referring Provider: Melrose Nakayama MD  Encounter Date: 02/26/2017      PT End of Session - 02/26/17 1114    Visit Number 7   Number of Visits 16   Date for PT Re-Evaluation 04/05/17   PT Start Time 1030   PT Stop Time 1115   PT Time Calculation (min) 45 min   Activity Tolerance Patient tolerated treatment well   Behavior During Therapy [facility] for tasks assessed/performed      Past Medical History:  Diagnosis Date  . Abnormal glucose   . Depression with anxiety   . Essential hypertension, benign   . Fibromyalgia   . Melanoma ([place])   . Near syncope   . Other and unspecified hyperlipidemia     Past Surgical History:  Procedure Laterality Date  . ABDOMINAL HYSTERECTOMY    . BREAST SURGERY     LEFT  . CHOLECYSTECTOMY    . ELBOW SURGERY     X's 3 Left  . RIGHT DISTAL RADIUS FRACTURE    . TOTAL KNEE ARTHROPLASTY     RIGHT    There were no vitals filed for this visit.      Subjective Assessment - 02/26/17 1111    Subjective pt still reporting R knee hurting worse than left. Pt feels her fibromyalgia can make her pain worse at times.    Pertinent History Pelvic fracture June 2018   How long can you walk comfortably? Short community distances.   Patient Stated Goals Reduce this knee pain.   Currently in Pain? Yes   Pain Score 4    Pain Location Knee   Pain Orientation Right   Pain Descriptors / Indicators Constant   Pain Type Chronic pain   Pain Onset More than a month ago            [facility] PT Assessment - 02/26/17 0001      Assessment   Medical Diagnosis Right distal hamstring strain and pes bursitis.   Next MD Visit 03/12/2017     Restrictions   Weight Bearing Restrictions No                     [facility] Adult PT Treatment/Exercise - 02/26/17 0001      Exercises   Exercises Knee/Hip     Knee/Hip Exercises: Stretches   Passive Hamstring Stretch Right;3 reps;30 seconds   Other Knee/Hip Stretches R passive ITB stretch 3x30 sec     Knee/Hip Exercises: Aerobic   Nustep L5 x15 min     Knee/Hip Exercises: Standing   Forward Lunges Right;1 set;10 reps   Forward Step Up Both;1 set;15 reps;Hand Hold: 2   Rocker Board 3 minutes   Other Standing Knee Exercises standing on foarm mat x 5 holding 30 seconds each, standing in staggered stance x 3 on each side holding 30 seconds each with intermittent CGA and Hand Held assistnace.   Other Standing Knee Exercises sit to stand x 15 on foam mat     Modalities   Modalities Electrical Stimulation     Moist Heat Therapy   Number Minutes Moist Heat 15 Minutes   Moist Heat Location Knee     Electrical Stimulation   Electrical Stimulation Location R distal hip adductors and IT band   Electrical Stimulation  Action pre-mod   Electrical Stimulation Parameters 80-150 Hz x 15 minutes   Electrical Stimulation Goals Tone;Pain                PT Education - 02/26/17 1114    Education provided Yes   Education Details Discussed pt's balance deficits and possible causes   Person(s) Educated Patient   Methods Explanation   Comprehension Verbalized understanding          PT Short Term Goals - 02/04/17 1629      PT SHORT TERM GOAL #1   Title STG's=LTG's.           PT Long Term Goals - 02/26/17 1248      PT LONG TERM GOAL #1   Title Independent with a HEP.   Time 8   Period Weeks   Status New     PT LONG TERM GOAL #2   Title Sit 30 minutes with right knee pain not > 4/10.   Time 8   Period Weeks   Status On-going     PT LONG TERM GOAL #3   Title Perform ADL's with pain not > 4/10.   Time 8   Period Weeks   Status New               Plan - 02/26/17 1243    Clinical Impression Statement Patient arriving today  reporting right knee pain. Pt tolerating treatment well and reporting less pain at end of session. We discuss pt's need for balance exercises. Continue with skilled PT.    Rehab Potential Good   Clinical Impairments Affecting Rehab Potential Chronicity.   PT Frequency 2x / week   PT Duration 8 weeks   PT Treatment/Interventions ADLs/Self Care Home Management;Cryotherapy;Electrical Stimulation;Iontophoresis 4mg /ml Dexamethasone;Therapeutic exercise;Therapeutic activities;Patient/family education;Manual techniques;Vasopneumatic Device   PT Next Visit Plan Ionto; IASTM; electrical stimulation.  Pain-free right hip and knee exercises.  Add Butterfly stretch to HEP.   PT Home Exercise Plan sit to stand, HS stretch, standing lunge for HF stretch   Consulted and Agree with Plan of Care Patient      Patient will benefit from skilled therapeutic intervention in order to improve the following deficits and impairments:  Abnormal gait, Decreased activity tolerance, Decreased mobility, Decreased range of motion, Decreased strength, Pain  Visit Diagnosis: Chronic pain of right knee  Stiffness of right knee, not elsewhere classified     Problem List Patient Active Problem List   Diagnosis Date Noted  . Closed fracture of right inferior pubic ramus ([place]) 11/18/2016  . Precordial pain 08/20/2011  . Essential hypertension, benign   . Other and unspecified hyperlipidemia     Oretha Caprice, MPT 02/26/2017, 12:56 PM  [facility] [place], [place], [address] Phone: [phone]   Fax:  [phone]  Name: Tiffany Brown MRN: [id number] Date of Birth: 10/04/1945

## 2017-03-04 ENCOUNTER — Ambulatory Visit: Payer: Medicare Other | Admitting: *Deleted

## 2017-03-04 DIAGNOSIS — G8929 Other chronic pain: Secondary | ICD-10-CM

## 2017-03-04 DIAGNOSIS — M25661 Stiffness of right knee, not elsewhere classified: Secondary | ICD-10-CM

## 2017-03-04 DIAGNOSIS — M25561 Pain in right knee: Secondary | ICD-10-CM | POA: Diagnosis not present

## 2017-03-04 NOTE — Therapy (Signed)
[facility] [place], [place], [address] Phone: [phone]   Fax:  [phone]  Physical Therapy Treatment  Patient Details  Name: Tiffany Brown MRN: [id number] Date of Birth: [birth year] Referring Provider: Melrose Nakayama MD  Encounter Date: 03/04/2017      PT End of Session - 03/04/17 1046    Visit Number 8   Number of Visits 16   Date for PT Re-Evaluation 04/05/17   PT Start Time 0945   PT Stop Time 5465   PT Time Calculation (min) 58 min      Past Medical History:  Diagnosis Date  . Abnormal glucose   . Depression with anxiety   . Essential hypertension, benign   . Fibromyalgia   . Melanoma ([place])   . Near syncope   . Other and unspecified hyperlipidemia     Past Surgical History:  Procedure Laterality Date  . ABDOMINAL HYSTERECTOMY    . BREAST SURGERY     LEFT  . CHOLECYSTECTOMY    . ELBOW SURGERY     X's 3 Left  . RIGHT DISTAL RADIUS FRACTURE    . TOTAL KNEE ARTHROPLASTY     RIGHT    There were no vitals filed for this visit.      Subjective Assessment - 03/04/17 0949    Subjective pt still reporting R knee hurting worse than left. Pt feels her fibromyalgia can make her pain worse at times.    Pertinent History Pelvic fracture June 2018   How long can you walk comfortably? Short community distances.   Patient Stated Goals Reduce this knee pain.   Currently in Pain? Yes   Pain Score 4    Pain Location Knee   Pain Orientation Right   Pain Descriptors / Indicators Constant   Pain Type Chronic pain   Pain Onset More than a month ago                         [facility] Adult PT Treatment/Exercise - 03/04/17 0001      Exercises   Exercises Knee/Hip     Knee/Hip Exercises: Aerobic   Nustep L5 x15 min     Knee/Hip Exercises: Standing   Forward Step Up Both;1 set;15 reps;Hand Hold: 2   Rocker Board 3 minutes   Other Standing Knee Exercises standing on RT LE x 5 holding 30  seconds each, standing in staggered stance x 3 on each side holding 30 seconds each with intermittent CGA and Hand Held assistnace.   Other Standing Knee Exercises sit to stand x 15 on foam mat     Modalities   Modalities Electrical Stimulation     Moist Heat Therapy   Number Minutes Moist Heat 15 Minutes   Moist Heat Location Knee     Electrical Stimulation   Electrical Stimulation Location R distal hip adductors and IT band Premod 80-150hz  x 15 mins   Electrical Stimulation Goals Tone;Pain                  PT Short Term Goals - 02/04/17 1629      PT SHORT TERM GOAL #1   Title STG's=LTG's.           PT Long Term Goals - 02/26/17 1248      PT LONG TERM GOAL #1   Title Independent with a HEP.   Time 8   Period Weeks   Status New     PT  LONG TERM GOAL #2   Title Sit 30 minutes with right knee pain not > 4/10.   Time 8   Period Weeks   Status On-going     PT LONG TERM GOAL #3   Title Perform ADL's with pain not > 4/10.   Time 8   Period Weeks   Status New             Patient will benefit from skilled therapeutic intervention in order to improve the following deficits and impairments:     Visit Diagnosis: Chronic pain of right knee  Stiffness of right knee, not elsewhere classified     Problem List Patient Active Problem List   Diagnosis Date Noted  . Closed fracture of right inferior pubic ramus ([place]) 11/18/2016  . Precordial pain 08/20/2011  . Essential hypertension, benign   . Other and unspecified hyperlipidemia     RAMSEUR,CHRIS, PTA 03/04/2017, 10:47 AM  [facility] [place], [place], [address] Phone: [phone]   Fax:  [phone]  Name: Tiffany Brown MRN: [id number] Date of Birth: 1945-10-04

## 2017-03-06 ENCOUNTER — Encounter: Payer: Self-pay | Admitting: Physical Therapy

## 2017-03-06 ENCOUNTER — Ambulatory Visit: Payer: Medicare Other | Attending: Orthopaedic Surgery | Admitting: Physical Therapy

## 2017-03-06 DIAGNOSIS — M25661 Stiffness of right knee, not elsewhere classified: Secondary | ICD-10-CM | POA: Diagnosis present

## 2017-03-06 DIAGNOSIS — M25561 Pain in right knee: Secondary | ICD-10-CM | POA: Diagnosis present

## 2017-03-06 DIAGNOSIS — G8929 Other chronic pain: Secondary | ICD-10-CM | POA: Diagnosis present

## 2017-03-06 NOTE — Patient Instructions (Signed)
[facility]  DRY NEEDLING CONSENT FORM   Trigger point dry needling is a physical therapy approach to treat Myofascial Pain and Dysfunction.  Dry Needling (DN) is a valuable and effective way to deactivate myofascial trigger points (muscle knots/pain). It is skilled intervention that uses a thin filiform needle to penetrate the skin and stimulate underlying myofascial trigger points, muscular, and connective tissues for the management of neuromusculoskeletal pain and movement impairments.  A local twitch response (LTR) will be elicited.  This can sometimes feel like a deep ache in the muscle during the procedure. Multiple trigger points in multiple muscles can be treated during each treatment.  No medication of any kind is injected.   As with any medical treatment and procedure, there are possible adverse events.  While significant adverse events are uncommon, they do sometimes occur and must be considered prior to giving consent.  1. Dry needling often causes a "post needling soreness".  There can be an increase in pain from a couple of hours to 2-3 days, followed by an improvement in the overall pain state. 2. Any time a needle is used there is a risk of infection.  However, we are using new, sterile, and disposable needles; infections are extremely rare. 3. There is a possibility that you may bleed or bruise.  You may feel tired and some nausea following treatment. 4. There is a rare possibility of a pneumothorax (air in the chest cavity). 5. Allergic reaction to nickel in the stainless steel needle. 6. If a nerve is touched, it may cause paresthesia (a prickling/shock sensation) which is usually brief, but may continue for a couple of days.  Following treatment stay hydrated.  Continue regular activities but not too vigorous initially after treatment for 24-48 hours.  Dry Needling is best when combined with other physical therapy interventions such as  strengthening, stretching and other therapeutic modalities.   PLEASE ANSWER THE FOLLOWING QUESTIONS:  Do you have a lack of sensation?   Y/N  Do you have a phobia or fear of needles  Y/N  Are you pregnant?    Y/N If yes:  How many weeks? __________ Do you have any implanted devices?  Y/N If yes:  Pacemaker/Spinal Cord Stimulator/Deep Brain Stimulator/Insulin Pump/Other: ________________ Do you have any implants?  Y/N If yes: Breast/Facial/Pecs/Buttocks/Calves/Hip  Replacement/ Knee Replacement/Other: _________ Do you take any blood thinners?   Y/N If yes: Coumadin (Warfarin)/Other: ___________________ Do you have a bleeding disorder?   Y/N If yes: What kind: _________________________________ Do you take any immunosuppressants?  Y/N If yes:   What kind: _________________________________ Do you take anti-inflammatories?   Y/N If yes: What kind: Advil/Aspirin/Other: ________________ Have you ever been diagnosed with Scoliosis? Y/N Have you had back surgery?   Y/N If yes:  Laminectomy/Fusion/Other: ___________________   I have read, or had read to me, the above.  I have had the opportunity to ask any questions.  All of my questions have been answered to my satisfaction and I understand the risks involved with dry needling.  I consent to examination and treatment at [facility], including dry needling, of any and all of my involved and affected muscles.  

## 2017-03-06 NOTE — Therapy (Signed)
[facility] [place], [place], [address] Phone: [phone]   Fax:  [phone]  Physical Therapy Evaluation  Patient Details  Name: Tiffany Brown MRN: [id number] Date of Birth: February 24, 1946 Referring Provider: Melrose Nakayama MD  Encounter Date: 03/06/2017      PT End of Session - 03/06/17 1006    Visit Number 9   Number of Visits 16   Date for PT Re-Evaluation 04/05/17   PT Start Time 3557   PT Stop Time 1043   PT Time Calculation (min) 56 min   Activity Tolerance Patient tolerated treatment well      Past Medical History:  Diagnosis Date  . Abnormal glucose   . Depression with anxiety   . Essential hypertension, benign   . Fibromyalgia   . Melanoma ([place])   . Near syncope   . Other and unspecified hyperlipidemia     Past Surgical History:  Procedure Laterality Date  . ABDOMINAL HYSTERECTOMY    . BREAST SURGERY     LEFT  . CHOLECYSTECTOMY    . ELBOW SURGERY     X's 3 Left  . RIGHT DISTAL RADIUS FRACTURE    . TOTAL KNEE ARTHROPLASTY     RIGHT    There were no vitals filed for this visit.       Subjective Assessment - 03/06/17 1007    Subjective I'm getting better.   Pain Score 3    Pain Location Knee   Pain Orientation Right   Pain Descriptors / Indicators Constant   Pain Type Chronic pain   Pain Onset More than a month ago                Objective measurements completed on examination: See above findings.          [facility] Adult PT Treatment/Exercise - 03/06/17 0001      Exercises   Exercises Knee/Hip     Knee/Hip Exercises: Aerobic   Nustep Level 5 x 20 minutes.     Knee/Hip Exercises: Standing   Rocker Board Limitations 3 minutes.     Modalities   Modalities Electrical Stimulation;Moist Heat     Moist Heat Therapy   Number Minutes Moist Heat 15 Minutes   Moist Heat Location --  Right knee.     Electrical Stimulation   Electrical Stimulation Location Right  distal medial hamstrings and Pes Anserine region.   Electrical Stimulation Action Pre-mod.   Electrical Stimulation Parameters Constant at 80-150 Hz x 15 minutes.   Electrical Stimulation Goals Pain     Manual Therapy   Manual Therapy Soft tissue mobilization   Soft tissue mobilization STW/M with cross-friction technique to distal medial right hamdstring group x 4 minutes.                  PT Short Term Goals - 02/04/17 1629      PT SHORT TERM GOAL #1   Title STG's=LTG's.           PT Long Term Goals - 02/26/17 1248      PT LONG TERM GOAL #1   Title Independent with a HEP.   Time 8   Period Weeks   Status New     PT LONG TERM GOAL #2   Title Sit 30 minutes with right knee pain not > 4/10.   Time 8   Period Weeks   Status On-going     PT LONG TERM GOAL #3   Title Perform ADL's  with pain not > 4/10.   Time 8   Period Weeks   Status New                Plan - 03/06/17 1038    Clinical Impression Statement Excellent response to treatment today.  Pes Anserine pain reduced significantly.  CC in area of left distal/medial hamstring region.  Patient considering dry needling to hamstrings.  Consent form provided for review today.      Patient will benefit from skilled therapeutic intervention in order to improve the following deficits and impairments:  Abnormal gait, Decreased activity tolerance, Decreased mobility, Decreased range of motion, Decreased strength, Pain  Visit Diagnosis: Chronic pain of right knee  Stiffness of right knee, not elsewhere classified     Problem List Patient Active Problem List   Diagnosis Date Noted  . Closed fracture of right inferior pubic ramus ([place]) 11/18/2016  . Precordial pain 08/20/2011  . Essential hypertension, benign   . Other and unspecified hyperlipidemia     Vere Diantonio, [place] MPT 03/06/2017, 10:43 AM  [facility] [place], [place],  [address] Phone: [phone]   Fax:  [phone]  Name: Tiffany Brown MRN: [id number] Date of Birth: April 04, 1946

## 2017-03-11 ENCOUNTER — Ambulatory Visit: Payer: Medicare Other | Admitting: Physical Therapy

## 2017-03-11 DIAGNOSIS — M25561 Pain in right knee: Principal | ICD-10-CM

## 2017-03-11 DIAGNOSIS — G8929 Other chronic pain: Secondary | ICD-10-CM

## 2017-03-11 DIAGNOSIS — M25661 Stiffness of right knee, not elsewhere classified: Secondary | ICD-10-CM

## 2017-03-11 NOTE — Therapy (Addendum)
[facility] [place], [place], [address] Phone: [phone]   Fax:  [phone]  Physical Therapy Treatment  Patient Details  Name: Tiffany Brown MRN: [id number] Date of Birth: [birth year] Referring Provider: Melrose Nakayama MD   Encounter Date: 03/11/2017  PT End of Session - 03/11/17 0952    Visit Number  10    Number of Visits  16    Date for PT Re-Evaluation  04/05/17    PT Start Time  0947    PT Stop Time  1039    PT Time Calculation (min)  52 min    Activity Tolerance  Patient tolerated treatment well    Behavior During Therapy  [facility] for tasks assessed/performed       Past Medical History:  Diagnosis Date  . Abnormal glucose   . Depression with anxiety   . Essential hypertension, benign   . Fibromyalgia   . Melanoma ([place])   . Near syncope   . Other and unspecified hyperlipidemia     Past Surgical History:  Procedure Laterality Date  . ABDOMINAL HYSTERECTOMY    . BREAST SURGERY     LEFT  . CHOLECYSTECTOMY    . ELBOW SURGERY     X's 3 Left  . RIGHT DISTAL RADIUS FRACTURE    . TOTAL KNEE ARTHROPLASTY     RIGHT    There were no vitals filed for this visit.  Subjective Assessment - 03/11/17 0955    Subjective  The right knee is 65-70 % better overall. The left knee is getting worse.    Pertinent History  Pelvic fracture June 2018    How long can you walk comfortably?  Short community distances.    Patient Stated Goals  Reduce this knee pain.    Currently in Pain?  Yes    Pain Score  3     Pain Location  Knee    Pain Orientation  Right    Pain Descriptors / Indicators  Constant    Pain Type  Chronic pain    Pain Onset  More than a month ago    Pain Frequency  Intermittent    Aggravating Factors   steps/stairs/prolonged walking    Pain Relieving Factors  heat/massage    Effect of Pain on Daily Activities  limited                      [facility] Adult PT Treatment/Exercise - 03/11/17  0001      Knee/Hip Exercises: Aerobic   Stationary Bike  L2 x 10 min    Other Aerobic  UBE x 2 min  patient put on UBE different therapist inadvertently   patient put on UBE different therapist inadvertently     Knee/Hip Exercises: Standing   Heel Raises  Both;20 reps    Forward Step Up  Both;1 set;15 reps;Hand Hold: 2;Step Height: 8"    Other Standing Knee Exercises  semitandem stance with LLE behind x 30 seconds; alternating toe taps to foam 2x10 bil    Other Standing Knee Exercises  side stepping on foam x 6 times intermittent hand hold       Modalities   Modalities  Electrical Stimulation;Moist Heat      Moist Heat Therapy   Number Minutes Moist Heat  15 Minutes    Moist Heat Location  Knee      Electrical Stimulation   Electrical Stimulation Location  R knee    Electrical Stimulation Action  premod    Electrical Stimulation Parameters  80-150 Hz x 15 min    Electrical Stimulation Goals  Pain      Iontophoresis with 30ml Dexamethasone to R pes anserine using 4 hour leave on patch.         PT Short Term Goals - 02/04/17 1629      PT SHORT TERM GOAL #1   Title  STG's=LTG's.        PT Long Term Goals - 03/15/17 1000      PT LONG TERM GOAL #1   Title  Independent with a HEP.    Time  8    Period  Weeks    Status  Achieved      PT LONG TERM GOAL #2   Title  Sit 30 minutes with right knee pain not > 4/10.    Time  8    Period  Weeks    Status  Achieved      PT LONG TERM GOAL #3   Title  Perform ADL's with pain not > 4/10.    Time  8    Period  Weeks    Status  On-going            Plan - 2017-03-15 1034    Clinical Impression Statement  Patient has done well with PT for her R knee meeting 2/3 long term goals. She still reports intermittent pain in R pes anserine region and distal HS and may benefit form iontophoresis and/or DN to the area. An ionto patch (1/6) was administered today. She complains more of L knee pain which is 6/10 today and which has  worsened with therapy.     Rehab Potential  Good    Clinical Impairments Affecting Rehab Potential  Chronicity.    PT Frequency  2x / week    PT Duration  8 weeks    PT Treatment/Interventions  ADLs/Self Care Home Management;Cryotherapy;Electrical Stimulation;Iontophoresis 4mg /ml Dexamethasone;Therapeutic exercise;Therapeutic activities;Patient/family education;Manual techniques;Vasopneumatic Device    PT Next Visit Plan  Assess ionto and continue with second patch if indicated. Continue LE strengthening.    PT Home Exercise Plan  sit to stand, HS stretch, standing lunge for HF stretch    Consulted and Agree with Plan of Care  Patient       Patient will benefit from skilled therapeutic intervention in order to improve the following deficits and impairments:  Abnormal gait, Decreased activity tolerance, Decreased mobility, Decreased range of motion, Decreased strength, Pain  Visit Diagnosis: Chronic pain of right knee  Stiffness of right knee, not elsewhere classified   G-Codes - 03-15-17 1050    Functional Assessment Tool Used (Outpatient Only)  FOTO.Marland Kitchen54% limitation.    Functional Limitation  Mobility: Walking and moving around    Mobility: Walking and Moving Around Current Status [phone])  At least 40 percent but less than 60 percent impaired, limited or restricted    Mobility: Walking and Moving Around Goal Status [phone])  At least 40 percent but less than 60 percent impaired, limited or restricted       Problem List Patient Active Problem List   Diagnosis Date Noted  . Closed fracture of right inferior pubic ramus ([place]) 11/18/2016  . Precordial pain 08/20/2011  . Essential hypertension, benign   . Other and unspecified hyperlipidemia    Madelyn Flavors PT 03/15/2017, 10:53 AM  [facility] [address] [place], [place], [address] Phone: [phone]   Fax:  [phone]  Name: Tiffany Brown [facility]  MRN: [id number] Date of Birth:  09-25-1945

## 2017-03-13 ENCOUNTER — Ambulatory Visit: Payer: Medicare Other | Admitting: Physical Therapy

## 2017-03-13 ENCOUNTER — Encounter: Payer: Self-pay | Admitting: Physical Therapy

## 2017-03-13 DIAGNOSIS — M25561 Pain in right knee: Secondary | ICD-10-CM | POA: Diagnosis not present

## 2017-03-13 DIAGNOSIS — M25661 Stiffness of right knee, not elsewhere classified: Secondary | ICD-10-CM

## 2017-03-13 DIAGNOSIS — G8929 Other chronic pain: Secondary | ICD-10-CM

## 2017-03-13 NOTE — Therapy (Signed)
[facility] [place], [place], [address] Phone: [phone]   Fax:  [phone]  Physical Therapy Treatment  Patient Details  Name: Tiffany Brown MRN: [id number] Date of Birth: 30-Jun-1945 Referring Provider: Melrose Nakayama MD   Encounter Date: 03/13/2017  PT End of Session - 03/13/17 0949    Visit Number  11    Number of Visits  16    Date for PT Re-Evaluation  04/05/17    PT Start Time  0945    PT Stop Time  1040    PT Time Calculation (min)  55 min    Activity Tolerance  Patient tolerated treatment well    Behavior During Therapy  [facility] for tasks assessed/performed       Past Medical History:  Diagnosis Date  . Abnormal glucose   . Depression with anxiety   . Essential hypertension, benign   . Fibromyalgia   . Melanoma ([employer])   . Near syncope   . Other and unspecified hyperlipidemia     Past Surgical History:  Procedure Laterality Date  . ABDOMINAL HYSTERECTOMY    . BREAST SURGERY     LEFT  . CHOLECYSTECTOMY    . ELBOW SURGERY     X's 3 Left  . RIGHT DISTAL RADIUS FRACTURE    . TOTAL KNEE ARTHROPLASTY     RIGHT    There were no vitals filed for this visit.  Subjective Assessment - 03/13/17 1022    Subjective  Pt reporting R knee is better overall. Pt reporting she discussed her progress with her MD and reports that her Left knee is causing more issues than her R at times.     Pertinent History  Pelvic fracture June 2018    How long can you walk comfortably?  Short community distances.    Patient Stated Goals  Reduce this knee pain.    Currently in Pain?  No/denies         [facility] PT Assessment - 03/13/17 0001      Assessment   Medical Diagnosis  Right distal hamstring strain and pes bursitis.    Next MD Visit  03/12/2017      Restrictions   Weight Bearing Restrictions  No                  [facility] Adult PT Treatment/Exercise - 03/13/17 0001      Exercises   Exercises  Knee/Hip      Knee/Hip Exercises: Aerobic   Nustep  5 x 15 minutes      Knee/Hip Exercises: Standing   Heel Raises  Both;20 reps    Forward Step Up  Both;1 set;15 reps;Hand Hold: 2;Step Height: 8"    Other Standing Knee Exercises  alternating toe taps x 30 reps    Other Standing Knee Exercises  side stepping on foam x 6 times intermittent hand hold  braiding front and back with bil UE support, constant vc's   braiding front and back with bil UE support, constant vc's     Modalities   Modalities  Electrical Stimulation;Moist Heat      Moist Heat Therapy   Number Minutes Moist Heat  15 Minutes    Moist Heat Location  Knee      Electrical Stimulation   Electrical Stimulation Location  R knee    Electrical Stimulation Action  premod    Electrical Stimulation Parameters  80-150 Hz x 15    Electrical Stimulation Goals  Pain  PT Education - 03/13/17 1023    Education provided  Yes    Education Details  Discussed [place] Stat Patch and wearing instructions. Pt instructed to remove after 4 hours.     Person(s) Educated  Patient    Methods  Explanation    Comprehension  Verbalized understanding       PT Short Term Goals - 02/04/17 1629      PT SHORT TERM GOAL #1   Title  STG's=LTG's.        PT Long Term Goals - 03/13/17 0956      PT LONG TERM GOAL #1   Title  Independent with a HEP.    Time  8    Period  Weeks    Status  Achieved      PT LONG TERM GOAL #2   Title  Sit 30 minutes with right knee pain not > 4/10.    Time  8    Period  Weeks    Status  Achieved      PT LONG TERM GOAL #3   Title  Perform ADL's with pain not > 4/10.    Time  8    Period  Weeks    Status  On-going            Plan - 03/13/17 0951    Clinical Impression Statement  Patient tolerating treatment well, reporting no pain at beginning of session. pt also reported that following Ionto patch she had no pain until late in the evening. Ionto patch 2/6 issued today following ther ex and  E-stim. Contiue PT for 5 more visits to progress toward goals set.     Rehab Potential  Good    Clinical Impairments Affecting Rehab Potential  Chronicity.    PT Frequency  2x / week    PT Duration  8 weeks    PT Treatment/Interventions  ADLs/Self Care Home Management;Cryotherapy;Electrical Stimulation;Iontophoresis 4mg /ml Dexamethasone;Therapeutic exercise;Therapeutic activities;Patient/family education;Manual techniques;Vasopneumatic Device    PT Next Visit Plan  Assess ionto and continue with second patch if indicated. Continue LE strengthening.    PT Home Exercise Plan  sit to stand, HS stretch, standing lunge for HF stretch    Consulted and Agree with Plan of Care  Patient       Patient will benefit from skilled therapeutic intervention in order to improve the following deficits and impairments:     Visit Diagnosis: Chronic pain of right knee  Stiffness of right knee, not elsewhere classified     Problem List Patient Active Problem List   Diagnosis Date Noted  . Closed fracture of right inferior pubic ramus ([place]) 11/18/2016  . Precordial pain 08/20/2011  . Essential hypertension, benign   . Other and unspecified hyperlipidemia     Oretha Caprice, MPT 03/13/2017, 10:42 AM  [facility] [address] [place], [place], [address] Phone: [phone]   Fax:  [phone]  Name: Tiffany Brown MRN: [id number] Date of Birth: Aug 20, 1945

## 2017-03-18 ENCOUNTER — Ambulatory Visit: Payer: Medicare Other | Admitting: Physical Therapy

## 2017-03-18 DIAGNOSIS — M25561 Pain in right knee: Principal | ICD-10-CM

## 2017-03-18 DIAGNOSIS — G8929 Other chronic pain: Secondary | ICD-10-CM

## 2017-03-18 DIAGNOSIS — M25661 Stiffness of right knee, not elsewhere classified: Secondary | ICD-10-CM

## 2017-03-18 NOTE — Therapy (Signed)
[facility] [place], [place], [address] Phone: [phone]   Fax:  [phone]  Physical Therapy Treatment  Patient Details  Name: Tiffany Brown MRN: [id number] Date of Birth: 01-11-1946 Referring Provider: Melrose Nakayama MD   Encounter Date: 03/18/2017  PT End of Session - 03/18/17 1042    Visit Number  12    Number of Visits  16    Date for PT Re-Evaluation  04/05/17    PT Start Time  1030    PT Stop Time  1126    PT Time Calculation (min)  56 min    Activity Tolerance  Patient tolerated treatment well    Behavior During Therapy  [facility] for tasks assessed/performed       Past Medical History:  Diagnosis Date  . Abnormal glucose   . Depression with anxiety   . Essential hypertension, benign   . Fibromyalgia   . Melanoma ([place])   . Near syncope   . Other and unspecified hyperlipidemia     Past Surgical History:  Procedure Laterality Date  . ABDOMINAL HYSTERECTOMY    . BREAST SURGERY     LEFT  . CHOLECYSTECTOMY    . ELBOW SURGERY     X's 3 Left  . RIGHT DISTAL RADIUS FRACTURE    . TOTAL KNEE ARTHROPLASTY     RIGHT    There were no vitals filed for this visit.  Subjective Assessment - 03/18/17 1042    Subjective  Patient states her R knee is better.    Pertinent History  Pelvic fracture June 2018    How long can you walk comfortably?  Short community distances.    Patient Stated Goals  Reduce this knee pain.    Currently in Pain?  Yes    Pain Score  3     Pain Location  Knee    Pain Orientation  Right    Pain Descriptors / Indicators  Aching;Dull    Pain Type  Chronic pain    Pain Onset  More than a month ago    Pain Frequency  Intermittent    Aggravating Factors   steps/stairs/prolonged walking    Pain Relieving Factors  heat/massage                      [facility] Adult PT Treatment/Exercise - 03/18/17 0001      Knee/Hip Exercises: Aerobic   Nustep  L6 x 15 min      Knee/Hip  Exercises: Standing   Heel Raises  Both;20 reps one set straight, one toes out    Forward Step Up  Both;2 sets;10 reps;Hand Hold: 2;Step Height: 8"    Other Standing Knee Exercises  alternating toe taps x 20 reps Bil onto foam    Other Standing Knee Exercises  side stepping on foam x 6 times intermittent hand hold  braiding back without UE support 4x15 steps      Modalities   Modalities  Electrical Stimulation;Iontophoresis;Moist Heat      Moist Heat Therapy   Number Minutes Moist Heat  15 Minutes    Moist Heat Location  Knee      Electrical Stimulation   Electrical Stimulation Location  R knee    Electrical Stimulation Action  premod    Electrical Stimulation Parameters  80-150 Hz x 15 min    Electrical Stimulation Goals  Pain      Iontophoresis   Type of Iontophoresis  Dexamethasone  Location  R pes anserine    Dose  1 ml    Time  4 hour patch               PT Short Term Goals - 02/04/17 1629      PT SHORT TERM GOAL #1   Title  STG's=LTG's.        PT Long Term Goals - 03/13/17 0956      PT LONG TERM GOAL #1   Title  Independent with a HEP.    Time  8    Period  Weeks    Status  Achieved      PT LONG TERM GOAL #2   Title  Sit 30 minutes with right knee pain not > 4/10.    Time  8    Period  Weeks    Status  Achieved      PT LONG TERM GOAL #3   Title  Perform ADL's with pain not > 4/10.    Time  8    Period  Weeks    Status  On-going            Plan - 03/18/17 1114    Clinical Impression Statement  Patient continues to progress with balance and pain control in her R knee. Ionto 3/6 issued today.    PT Treatment/Interventions  ADLs/Self Care Home Management;Cryotherapy;Electrical Stimulation;Iontophoresis 4mg /ml Dexamethasone;Therapeutic exercise;Therapeutic activities;Patient/family education;Manual techniques;Vasopneumatic Device    PT Next Visit Plan   Continue LE strengthening, balance and ionto/estim.       Patient will benefit from  skilled therapeutic intervention in order to improve the following deficits and impairments:  Abnormal gait, Decreased activity tolerance, Decreased mobility, Decreased range of motion, Decreased strength, Pain  Visit Diagnosis: Chronic pain of right knee  Stiffness of right knee, not elsewhere classified     Problem List Patient Active Problem List   Diagnosis Date Noted  . Closed fracture of right inferior pubic ramus ([place]) 11/18/2016  . Precordial pain 08/20/2011  . Essential hypertension, benign   . Other and unspecified hyperlipidemia     Madelyn Flavors PT 03/18/2017, 12:31 PM  [facility] [place], [place], [address] Phone: [phone]   Fax:  [phone]  Name: MELYNA HURON MRN: [id number] Date of Birth: 06-22-1945

## 2017-03-20 ENCOUNTER — Ambulatory Visit: Payer: Medicare Other | Admitting: *Deleted

## 2017-03-20 DIAGNOSIS — M25561 Pain in right knee: Secondary | ICD-10-CM | POA: Diagnosis not present

## 2017-03-20 DIAGNOSIS — M25661 Stiffness of right knee, not elsewhere classified: Secondary | ICD-10-CM

## 2017-03-20 DIAGNOSIS — G8929 Other chronic pain: Secondary | ICD-10-CM

## 2017-03-20 NOTE — Therapy (Signed)
[facility] [place], [place], [address] Phone: [phone]   Fax:  [phone]  Physical Therapy Treatment  Patient Details  Name: Tiffany Brown MRN: [id number] Date of Birth: 1945/10/10 Referring Provider: Melrose Nakayama MD   Encounter Date: 03/20/2017  PT End of Session - 03/20/17 1107    Visit Number  13    Number of Visits  16    Date for PT Re-Evaluation  04/05/17    PT Start Time  1030    PT Stop Time  1120    PT Time Calculation (min)  50 min       Past Medical History:  Diagnosis Date  . Abnormal glucose   . Depression with anxiety   . Essential hypertension, benign   . Fibromyalgia   . Melanoma ([place])   . Near syncope   . Other and unspecified hyperlipidemia     Past Surgical History:  Procedure Laterality Date  . ABDOMINAL HYSTERECTOMY    . BREAST SURGERY     LEFT  . CHOLECYSTECTOMY    . ELBOW SURGERY     X's 3 Left  . RIGHT DISTAL RADIUS FRACTURE    . TOTAL KNEE ARTHROPLASTY     RIGHT    There were no vitals filed for this visit.  Subjective Assessment - 03/20/17 1056    Subjective  Patient states her R knee is better. Not feeling good today due to the weather    Pertinent History  Pelvic fracture June 2018    How long can you walk comfortably?  Short community distances.    Patient Stated Goals  Reduce this knee pain.    Currently in Pain?  Yes    Pain Score  3     Pain Location  Knee                      [facility] Adult PT Treatment/Exercise - 03/20/17 0001      Exercises   Exercises  Knee/Hip      Knee/Hip Exercises: Aerobic   Nustep  L6 x 15 min      Knee/Hip Exercises: Standing   Heel Raises  Both;20 reps one set straight, one toes out    Forward Step Up  Both;2 sets;10 reps;Hand Hold: 2;Step Height: 8"    Other Standing Knee Exercises  alternating toe taps x 20 reps Bil onto foam    Other Standing Knee Exercises  side stepping on foam x 6 times intermittent  hand hold  braiding back without UE support 4x15 steps      Knee/Hip Exercises: Seated   Long Arc Quad  Strengthening;Right;3 sets 4#      Moist Heat Therapy   Number Minutes Moist Heat  15 Minutes    Moist Heat Location  Knee      Electrical Stimulation   Electrical Stimulation Location  R knee premod 80-150hz   x 15 mins    Electrical Stimulation Goals  Pain      Iontophoresis   Type of Iontophoresis  Dexamethasone    Location  R pes anserine    Dose  1 ml    Time  4 hour patch               PT Short Term Goals - 02/04/17 1629      PT SHORT TERM GOAL #1   Title  STG's=LTG's.        PT Long Term Goals - 03/13/17 0347  PT LONG TERM GOAL #1   Title  Independent with a HEP.    Time  8    Period  Weeks    Status  Achieved      PT LONG TERM GOAL #2   Title  Sit 30 minutes with right knee pain not > 4/10.    Time  8    Period  Weeks    Status  Achieved      PT LONG TERM GOAL #3   Title  Perform ADL's with pain not > 4/10.    Time  8    Period  Weeks    Status  On-going            Plan - 03/20/17 1638    Clinical Impression Statement  Pt arrived to clinic today doing fair, but  hurting all over. She was able to complete all therex for LT knee today with mainly soreness all around her knee, especially pes anserine area. 4/6 ionto today    Rehab Potential  Good    Clinical Impairments Affecting Rehab Potential  Chronicity.    PT Frequency  2x / week    PT Duration  8 weeks    PT Treatment/Interventions  ADLs/Self Care Home Management;Cryotherapy;Electrical Stimulation;Iontophoresis 4mg /ml Dexamethasone;Therapeutic exercise;Therapeutic activities;Patient/family education;Manual techniques;Vasopneumatic Device    PT Next Visit Plan   Continue LE strengthening, balance and ionto/estim.    PT Home Exercise Plan  sit to stand, HS stretch, standing lunge for HF stretch    Consulted and Agree with Plan of Care  Patient       Patient will benefit from  skilled therapeutic intervention in order to improve the following deficits and impairments:  Abnormal gait, Decreased activity tolerance, Decreased mobility, Decreased range of motion, Decreased strength, Pain  Visit Diagnosis: Chronic pain of right knee  Stiffness of right knee, not elsewhere classified     Problem List Patient Active Problem List   Diagnosis Date Noted  . Closed fracture of right inferior pubic ramus ([place]) 11/18/2016  . Precordial pain 08/20/2011  . Essential hypertension, benign   . Other and unspecified hyperlipidemia     RAMSEUR,CHRIS, PTA 03/20/2017, 4:51 PM  [facility] [place], [place], [address] Phone: [phone]   Fax:  [phone]  Name: ZAYNA TOSTE MRN: [id number] Date of Birth: 1945-12-24

## 2017-03-24 ENCOUNTER — Other Ambulatory Visit: Payer: Self-pay | Admitting: Family Medicine

## 2017-03-24 DIAGNOSIS — Z1231 Encounter for screening mammogram for malignant neoplasm of breast: Secondary | ICD-10-CM

## 2017-03-25 ENCOUNTER — Ambulatory Visit: Payer: Medicare Other | Admitting: Physical Therapy

## 2017-03-25 ENCOUNTER — Encounter: Payer: Self-pay | Admitting: Physical Therapy

## 2017-03-25 DIAGNOSIS — M25661 Stiffness of right knee, not elsewhere classified: Secondary | ICD-10-CM

## 2017-03-25 DIAGNOSIS — M25561 Pain in right knee: Principal | ICD-10-CM

## 2017-03-25 DIAGNOSIS — G8929 Other chronic pain: Secondary | ICD-10-CM

## 2017-03-25 NOTE — Therapy (Signed)
[facility] [place], [place], [address] Phone: [phone]   Fax:  [phone]  Physical Therapy Treatment  Patient Details  Name: Tiffany Brown MRN: [id number] Date of Birth: [birth year] Referring Provider: Melrose Nakayama MD   Encounter Date: 03/25/2017  PT End of Session - 03/25/17 0957    Visit Number  14    Number of Visits  16    Date for PT Re-Evaluation  04/05/17    PT Start Time  0948    PT Stop Time  1042    PT Time Calculation (min)  54 min    Activity Tolerance  Patient tolerated treatment well    Behavior During Therapy  [facility] for tasks assessed/performed       Past Medical History:  Diagnosis Date  . Abnormal glucose   . Depression with anxiety   . Essential hypertension, benign   . Fibromyalgia   . Melanoma ([place])   . Near syncope   . Other and unspecified hyperlipidemia     Past Surgical History:  Procedure Laterality Date  . ABDOMINAL HYSTERECTOMY    . BREAST SURGERY     LEFT  . CHOLECYSTECTOMY    . ELBOW SURGERY     X's 3 Left  . RIGHT DISTAL RADIUS FRACTURE    . TOTAL KNEE ARTHROPLASTY     RIGHT    There were no vitals filed for this visit.  Subjective Assessment - 03/25/17 0955    Subjective  Reports that the HS issue she originally came to PT for is better but states that her knee is still sore and MD says that will never get better.    Pertinent History  Pelvic fracture June 2018    How long can you walk comfortably?  Short community distances.    Patient Stated Goals  Reduce this knee pain.    Currently in Pain?  Other (Comment) No pain assessment provided by patient         [facility] PT Assessment - 03/25/17 0001      Assessment   Medical Diagnosis  Right distal hamstring strain and pes bursitis.    Next MD Visit  None      Restrictions   Weight Bearing Restrictions  No                  [facility] Adult PT Treatment/Exercise - 03/25/17 0001      Knee/Hip  Exercises: Aerobic   Nustep  L6 x 15 min      Knee/Hip Exercises: Standing   Terminal Knee Extension  Strengthening;Right;2 sets;10 reps Pink XTS    Hip Abduction  AROM;Both;2 sets;10 reps;Knee straight    Lateral Step Up  Both;20 reps;Hand Hold: 2;Step Height: 6"    Forward Step Up  Both;2 sets;10 reps;Hand Hold: 1;Step Height: 6"      Modalities   Modalities  Electrical Stimulation;Iontophoresis;Moist Heat      Moist Heat Therapy   Number Minutes Moist Heat  15 Minutes    Moist Heat Location  Knee      Electrical Stimulation   Electrical Stimulation Location  R inferior knee    Electrical Stimulation Action  Pre-Mod    Electrical Stimulation Parameters  80-150 hz x15 min    Electrical Stimulation Goals  Pain      Iontophoresis   Type of Iontophoresis  Dexamethasone    Location  R pes anserine    Dose  1 ml    Time  4  hour patch               PT Short Term Goals - 02/04/17 1629      PT SHORT TERM GOAL #1   Title  STG's=LTG's.        PT Long Term Goals - 03/13/17 0956      PT LONG TERM GOAL #1   Title  Independent with a HEP.    Time  8    Period  Weeks    Status  Achieved      PT LONG TERM GOAL #2   Title  Sit 30 minutes with right knee pain not > 4/10.    Time  8    Period  Weeks    Status  Achieved      PT LONG TERM GOAL #3   Title  Perform ADL's with pain not > 4/10.    Time  8    Period  Weeks    Status  On-going            Plan - 03/25/17 1023    Clinical Impression Statement  Patient arrived in clinic with reports of R inferior knee discomfort which she states that MD won't improve. Patient guided through various knee/hip strengthening exercises to which patient did not report any increased pain with any of them. Patient requested lateral knee for iontphoresis patch to be donned to. Patient also educated on use of her TENS unit for home with patient verbalizing understanding of use. Normal modalities response noted following removal of  the modalities.    Rehab Potential  Good    Clinical Impairments Affecting Rehab Potential  Chronicity.    PT Frequency  2x / week    PT Duration  8 weeks    PT Treatment/Interventions  ADLs/Self Care Home Management;Cryotherapy;Electrical Stimulation;Iontophoresis 4mg /ml Dexamethasone;Therapeutic exercise;Therapeutic activities;Patient/family education;Manual techniques;Vasopneumatic Device    PT Next Visit Plan   Continue LE strengthening, balance and ionto/estim.    PT Home Exercise Plan  sit to stand, HS stretch, standing lunge for HF stretch    Consulted and Agree with Plan of Care  Patient       Patient will benefit from skilled therapeutic intervention in order to improve the following deficits and impairments:  Abnormal gait, Decreased activity tolerance, Decreased mobility, Decreased range of motion, Decreased strength, Pain  Visit Diagnosis: Chronic pain of right knee  Stiffness of right knee, not elsewhere classified     Problem List Patient Active Problem List   Diagnosis Date Noted  . Closed fracture of right inferior pubic ramus ([place]) 11/18/2016  . Precordial pain 08/20/2011  . Essential hypertension, benign   . Other and unspecified hyperlipidemia     Tiffany Brown, PTA 03/25/2017, 10:51 AM  [facility] [address] [place], [place], [address] Phone: [phone]   Fax:  [phone]  Name: Tiffany Brown MRN: [id number] Date of Birth: 1946-02-05

## 2017-04-01 ENCOUNTER — Ambulatory Visit: Payer: Medicare Other | Admitting: Physical Therapy

## 2017-04-01 ENCOUNTER — Encounter: Payer: Self-pay | Admitting: Physical Therapy

## 2017-04-01 ENCOUNTER — Other Ambulatory Visit: Payer: Self-pay

## 2017-04-01 DIAGNOSIS — M25661 Stiffness of right knee, not elsewhere classified: Secondary | ICD-10-CM

## 2017-04-01 DIAGNOSIS — M25561 Pain in right knee: Secondary | ICD-10-CM | POA: Diagnosis not present

## 2017-04-01 DIAGNOSIS — G8929 Other chronic pain: Secondary | ICD-10-CM

## 2017-04-01 NOTE — Therapy (Signed)
[facility] [place], [place], [address] Phone: [phone]   Fax:  [phone]  Physical Therapy Treatment  Patient Details  Name: Tiffany Brown MRN: [id number] Date of Birth: May 05, 1946 Referring Provider: Melrose Nakayama MD   Encounter Date: 04/01/2017  PT End of Session - 04/01/17 1432    Visit Number  15    Number of Visits  16    Date for PT Re-Evaluation  04/05/17    PT Start Time  1030    PT Stop Time  1125    PT Time Calculation (min)  55 min    Activity Tolerance  Patient tolerated treatment well    Behavior During Therapy  [facility] for tasks assessed/performed       Past Medical History:  Diagnosis Date  . Abnormal glucose   . Depression with anxiety   . Essential hypertension, benign   . Fibromyalgia   . Melanoma ([place])   . Near syncope   . Other and unspecified hyperlipidemia     Past Surgical History:  Procedure Laterality Date  . ABDOMINAL HYSTERECTOMY    . BREAST SURGERY     LEFT  . CHOLECYSTECTOMY    . ELBOW SURGERY     X's 3 Left  . RIGHT DISTAL RADIUS FRACTURE    . TOTAL KNEE ARTHROPLASTY     RIGHT    There were no vitals filed for this visit.  Subjective Assessment - 04/01/17 1427    Subjective  Pt arriving to therapy reporting the area that we have been working on with therapy has improved, but pt now reporting inferior R knee pain.    Pertinent History  Pelvic fracture June 2018    How long can you walk comfortably?  Short community distances.    Patient Stated Goals  Reduce this knee pain.    Currently in Pain?  Yes    Pain Score  4     Pain Location  Knee    Pain Orientation  Right    Pain Descriptors / Indicators  Aching;Sore    Pain Type  Chronic pain    Pain Onset  More than a month ago    Pain Frequency  Intermittent    Aggravating Factors   steps/stairs/prolonged walking    Pain Relieving Factors  heat/massage    Effect of Pain on Daily Activities  limitation          [facility] PT Assessment - 04/01/17 0001      Assessment   Medical Diagnosis  Right distal hamstring strain and pes bursitis.    Next MD Visit  None      Restrictions   Weight Bearing Restrictions  No                  [facility] Adult PT Treatment/Exercise - 04/01/17 0001      Exercises   Exercises  Knee/Hip      Knee/Hip Exercises: Aerobic   Nustep  L6 x 15 min      Knee/Hip Exercises: Standing   Terminal Knee Extension  Strengthening;Right;2 sets;10 reps Pink XTS    Hip Abduction  AROM;Both;2 sets;10 reps;Knee straight    Lateral Step Up  Both;20 reps;Hand Hold: 2;Step Height: 6"    Forward Step Up  Both;2 sets;10 reps;Hand Hold: 1;Step Height: 6"    Rocker Board  2 minutes;Limitations;Other (comment)    Rocker Board Limitations  rocker board side to side 2 minutes with UE support    Other  Standing Knee Exercises  sit to stand x 15 from lowest mat setting no UE support      Knee/Hip Exercises: Seated   Long Arc Quad  Strengthening;20 reps;Limitations    Long Arc Quad Limitations  green theraband    Hamstring Curl  Strengthening;Limitations    Hamstring Limitations  green theraband x 20 reps      Modalities   Modalities  Electrical Stimulation;Iontophoresis;Moist Heat      Moist Heat Therapy   Number Minutes Moist Heat  15 Minutes    Moist Heat Location  Knee      Electrical Stimulation   Electrical Stimulation Location  R inferior knee    Electrical Stimulation Action  pre-mod    Electrical Stimulation Parameters  80-150 Hz x 15 minutes    Electrical Stimulation Goals  Pain      Iontophoresis   Type of Iontophoresis  Dexamethasone    Location  R pes anserine    Dose  1 ml    Time  4 hour patch             PT Education - 04/01/17 1432    Education provided  Yes    Education Details  Reviewed Ionto patch instructions    Person(s) Educated  Patient    Methods  Explanation    Comprehension  Verbalized understanding       PT Short Term Goals  - 02/04/17 1629      PT SHORT TERM GOAL #1   Title  STG's=LTG's.        PT Long Term Goals - 04/01/17 1435      PT LONG TERM GOAL #1   Title  Independent with a HEP.    Status  Achieved      PT LONG TERM GOAL #2   Title  Sit 30 minutes with right knee pain not > 4/10.    Status  Achieved      PT LONG TERM GOAL #3   Title  Perform ADL's with pain not > 4/10.    Time  8    Period  Weeks    Status  On-going            Plan - 04/01/17 1433    Clinical Impression Statement  Patient arriving to clinic reporting R inferior knee pain which is different from what therapy has been concentrating on. Pt reporting improvements in her original therapy site. Pt requesting lateral knee iontophoresis patch and instructions given. Pt reporting relief following patch wear. Pt with one more therapy visit and must have a recertification if there is to continue.     Rehab Potential  Good    Clinical Impairments Affecting Rehab Potential  Chronicity.    PT Frequency  2x / week    PT Duration  8 weeks    PT Treatment/Interventions  ADLs/Self Care Home Management;Cryotherapy;Electrical Stimulation;Iontophoresis 4mg /ml Dexamethasone;Therapeutic exercise;Therapeutic activities;Patient/family education;Manual techniques;Vasopneumatic Device    PT Next Visit Plan  Recert vs discharge    PT Home Exercise Plan  sit to stand, HS stretch, standing lunge for HF stretch    Consulted and Agree with Plan of Care  Patient       Patient will benefit from skilled therapeutic intervention in order to improve the following deficits and impairments:  Abnormal gait, Decreased activity tolerance, Decreased mobility, Decreased range of motion, Decreased strength, Pain  Visit Diagnosis: Chronic pain of right knee  Stiffness of right knee, not elsewhere classified     Problem  List Patient Active Problem List   Diagnosis Date Noted  . Closed fracture of right inferior pubic ramus ([place]) 11/18/2016  .  Precordial pain 08/20/2011  . Essential hypertension, benign   . Other and unspecified hyperlipidemia     Oretha Caprice, MPT 04/01/2017, 2:41 PM  [facility] [place], [place], [address] Phone: [phone]   Fax:  [phone]  Name: Tiffany Brown MRN: [id number] Date of Birth: 10-30-1945

## 2017-04-03 ENCOUNTER — Ambulatory Visit: Payer: Medicare Other | Admitting: *Deleted

## 2017-04-03 DIAGNOSIS — M25661 Stiffness of right knee, not elsewhere classified: Secondary | ICD-10-CM

## 2017-04-03 DIAGNOSIS — M25561 Pain in right knee: Principal | ICD-10-CM

## 2017-04-03 DIAGNOSIS — G8929 Other chronic pain: Secondary | ICD-10-CM

## 2017-04-03 NOTE — Therapy (Signed)
[facility] [place], [place], [address] Phone: [phone]   Fax:  [phone]  Physical Therapy Treatment  Patient Details  Name: Tiffany Brown MRN: [id number] Date of Birth: 05/05/1946 Referring Provider: Melrose Nakayama MD   Encounter Date: 04/03/2017  PT End of Session - 04/03/17 1049    Visit Number  16    Number of Visits  16    Date for PT Re-Evaluation  04/05/17    PT Start Time  1030    PT Stop Time  1116    PT Time Calculation (min)  46 min       Past Medical History:  Diagnosis Date  . Abnormal glucose   . Depression with anxiety   . Essential hypertension, benign   . Fibromyalgia   . Melanoma ([place])   . Near syncope   . Other and unspecified hyperlipidemia     Past Surgical History:  Procedure Laterality Date  . ABDOMINAL HYSTERECTOMY    . BREAST SURGERY     LEFT  . CHOLECYSTECTOMY    . ELBOW SURGERY     X's 3 Left  . RIGHT DISTAL RADIUS FRACTURE    . TOTAL KNEE ARTHROPLASTY     RIGHT    There were no vitals filed for this visit.  Subjective Assessment - 04/03/17 1049    Subjective  Doing good 95% better RT knee    Pertinent History  Pelvic fracture June 2018    How long can you walk comfortably?  Short community distances.    Currently in Pain?  No/denies                      [facility] Adult PT Treatment/Exercise - 04/03/17 0001      Exercises   Exercises  Knee/Hip      Knee/Hip Exercises: Stretches   Active Hamstring Stretch  5 reps;20 seconds      Knee/Hip Exercises: Aerobic   Nustep  L6 x 15 min      Knee/Hip Exercises: Seated   Long Arc Quad  Right;1 set;10 reps      Knee/Hip Exercises: Supine   Straight Leg Raises  AROM;Right;10 reps      Modalities   Modalities  Electrical Stimulation;Iontophoresis;Moist Heat      Moist Heat Therapy   Number Minutes Moist Heat  15 Minutes    Moist Heat Location  Knee      Electrical Stimulation   Electrical  Stimulation Location  R inferior knee    Electrical Stimulation Action  premod    Electrical Stimulation Parameters  80-150hz  x 15 mins    Electrical Stimulation Goals  Pain               PT Short Term Goals - 02/04/17 1629      PT SHORT TERM GOAL #1   Title  STG's=LTG's.        PT Long Term Goals - 04/03/17 1050      PT LONG TERM GOAL #1   Title  Independent with a HEP.    Time  8    Period  Weeks    Status  Achieved      PT LONG TERM GOAL #2   Title  Sit 30 minutes with right knee pain not > 4/10.    Time  8    Period  Weeks    Status  Achieved      PT LONG TERM GOAL #3  Title  Perform ADL's with pain not > 4/10.    Time  8    Period  Weeks    Status  Achieved            Plan - 04-30-17 1111    Clinical Impression Statement  Pt arrived today doing 95% better with RT knee pain. She has met all LTG's and has progressed on her FOTO from 67% limitation to 45% limitation. DC to Gym program.    Rehab Potential  Good    Clinical Impairments Affecting Rehab Potential  Chronicity.    PT Frequency  2x / week    PT Duration  8 weeks    PT Treatment/Interventions  ADLs/Self Care Home Management;Cryotherapy;Electrical Stimulation;Iontophoresis 17m/ml Dexamethasone;Therapeutic exercise;Therapeutic activities;Patient/family education;Manual techniques;Vasopneumatic Device    PT Next Visit Plan  Recert vs discharge    PT Home Exercise Plan  sit to stand, HS stretch, standing lunge for HF stretch    Consulted and Agree with Plan of Care  Patient       Patient will benefit from skilled therapeutic intervention in order to improve the following deficits and impairments:     Visit Diagnosis: Chronic pain of right knee  Stiffness of right knee, not elsewhere classified   G-Codes - [id number]    Functional Assessment Tool Used (Outpatient Only)  16th visit FOTO 45% limitation DC       Problem List Patient Active Problem List   Diagnosis Date Noted  .  Closed fracture of right inferior pubic ramus ([place] 11/18/2016  . Precordial pain 08/20/2011  . Essential hypertension, benign   . Other and unspecified hyperlipidemia     RAMSEUR,CHRIS,PTA 1December 26, 2018 12:17 PM  [facility] [address] [place] [address]Phone: [phone]  Fax:  [phone] Name: Tiffany FINNIGANMRN: [id number] of Birth: 6December 22, 1947 PHYSICAL THERAPY DISCHARGE SUMMARY  Visits from Start of Care: 16.  Current functional level related to goals / functional outcomes: See above.   Remaining deficits: Goals met.   Education / Equipment: HEP. Plan: Patient agrees to discharge.  Patient goals were met. Patient is being discharged due to meeting the stated rehab goals.  ?????       CMaliApplegate MPT

## 2017-04-22 ENCOUNTER — Other Ambulatory Visit: Payer: Self-pay | Admitting: Dermatology

## 2017-04-23 ENCOUNTER — Ambulatory Visit
Admission: RE | Admit: 2017-04-23 | Discharge: 2017-04-23 | Disposition: A | Discharge status: Home / Self Care | Payer: Medicare Other | Source: Ambulatory Visit | Attending: Family Medicine | Admitting: Family Medicine

## 2017-04-23 DIAGNOSIS — Z1231 Encounter for screening mammogram for malignant neoplasm of breast: Secondary | ICD-10-CM

## 2017-06-03 ENCOUNTER — Other Ambulatory Visit: Payer: Self-pay | Admitting: Dermatology

## 2017-07-23 DIAGNOSIS — Z6841 Body Mass Index (BMI) 40.0 and over, adult: Secondary | ICD-10-CM | POA: Insufficient documentation

## 2017-09-29 ENCOUNTER — Encounter (HOSPITAL_COMMUNITY): Payer: Self-pay | Admitting: Emergency Medicine

## 2017-09-29 ENCOUNTER — Emergency Department (HOSPITAL_COMMUNITY)
Admission: EM | Admit: 2017-09-29 | Discharge: 2017-09-29 | Disposition: A | Discharge status: Home / Self Care | Payer: Medicare Other | Attending: Emergency Medicine | Admitting: Emergency Medicine

## 2017-09-29 ENCOUNTER — Emergency Department (HOSPITAL_COMMUNITY): Payer: Medicare Other

## 2017-09-29 ENCOUNTER — Other Ambulatory Visit: Payer: Self-pay

## 2017-09-29 DIAGNOSIS — S65202A Unspecified injury of superficial palmar arch of left hand, initial encounter: Secondary | ICD-10-CM | POA: Diagnosis present

## 2017-09-29 DIAGNOSIS — S0990XA Unspecified injury of head, initial encounter: Secondary | ICD-10-CM | POA: Insufficient documentation

## 2017-09-29 DIAGNOSIS — S61215A Laceration without foreign body of left ring finger without damage to nail, initial encounter: Secondary | ICD-10-CM | POA: Insufficient documentation

## 2017-09-29 DIAGNOSIS — Y998 Other external cause status: Secondary | ICD-10-CM | POA: Insufficient documentation

## 2017-09-29 DIAGNOSIS — Z79899 Other long term (current) drug therapy: Secondary | ICD-10-CM | POA: Diagnosis not present

## 2017-09-29 DIAGNOSIS — Z85828 Personal history of other malignant neoplasm of skin: Secondary | ICD-10-CM | POA: Insufficient documentation

## 2017-09-29 DIAGNOSIS — Y9389 Activity, other specified: Secondary | ICD-10-CM | POA: Insufficient documentation

## 2017-09-29 DIAGNOSIS — S63287A Dislocation of proximal interphalangeal joint of left little finger, initial encounter: Secondary | ICD-10-CM | POA: Insufficient documentation

## 2017-09-29 DIAGNOSIS — Y929 Unspecified place or not applicable: Secondary | ICD-10-CM | POA: Diagnosis not present

## 2017-09-29 DIAGNOSIS — I10 Essential (primary) hypertension: Secondary | ICD-10-CM | POA: Diagnosis not present

## 2017-09-29 DIAGNOSIS — Z96651 Presence of right artificial knee joint: Secondary | ICD-10-CM | POA: Diagnosis not present

## 2017-09-29 DIAGNOSIS — W01198A Fall on same level from slipping, tripping and stumbling with subsequent striking against other object, initial encounter: Secondary | ICD-10-CM | POA: Diagnosis not present

## 2017-09-29 DIAGNOSIS — S62647A Nondisplaced fracture of proximal phalanx of left little finger, initial encounter for closed fracture: Secondary | ICD-10-CM | POA: Diagnosis not present

## 2017-09-29 DIAGNOSIS — F329 Major depressive disorder, single episode, unspecified: Secondary | ICD-10-CM | POA: Insufficient documentation

## 2017-09-29 DIAGNOSIS — F419 Anxiety disorder, unspecified: Secondary | ICD-10-CM | POA: Diagnosis not present

## 2017-09-29 DIAGNOSIS — S63259A Unspecified dislocation of unspecified finger, initial encounter: Secondary | ICD-10-CM

## 2017-09-29 DIAGNOSIS — Z87891 Personal history of nicotine dependence: Secondary | ICD-10-CM | POA: Diagnosis not present

## 2017-09-29 DIAGNOSIS — Z9049 Acquired absence of other specified parts of digestive tract: Secondary | ICD-10-CM | POA: Diagnosis not present

## 2017-09-29 MED ORDER — BUPIVACAINE HCL (PF) 0.5 % IJ SOLN
20.0000 mL | Freq: Once | INTRAMUSCULAR | Status: AC
  Administered 2017-09-29: 20 mL
  Filled 2017-09-29: qty 20

## 2017-09-29 MED ORDER — CEPHALEXIN 500 MG PO CAPS: 500.0000 mg | ORAL_CAPSULE | Freq: Three times a day (TID) | ORAL | 0 refills | Status: DC

## 2017-09-29 MED ORDER — HYDROCODONE-ACETAMINOPHEN 5-325 MG PO TABS
1.0000 | ORAL_TABLET | Freq: Once | ORAL | Status: AC
  Administered 2017-09-29: 1 via ORAL
  Filled 2017-09-29: qty 1

## 2017-09-29 NOTE — ED Triage Notes (Signed)
Pt BIB [facility] after a mechanical fall off a curb. Pt has hematoma to the right forehead, denies LOC, A&O x 4. Left hand splinted PTA, EMS reports an angulated fx to left ring finger. Chronic back pain, pt reports no changes in pain to back.

## 2017-09-29 NOTE — Discharge Instructions (Addendum)
Wear splint until seen in follow-up by Dr. Apolonio Schneiders Keflex-antibiotic, as prescribed. Continue to use your Norco down for pain

## 2017-09-29 NOTE — ED Notes (Signed)
Ortho at bedside.

## 2017-09-29 NOTE — ED Notes (Signed)
Patient transported to X-ray 

## 2017-09-29 NOTE — ED Notes (Signed)
Ortho paged. 

## 2017-09-29 NOTE — ED Notes (Signed)
Called returned from from [place].

## 2017-09-29 NOTE — ED Provider Notes (Signed)
[facility] EMERGENCY DEPARTMENT Provider Note   CSN: [id number] Arrival date & time: 09/29/17  1554     History   Chief Complaint Chief Complaint  Patient presents with  . Fall    HPI Tiffany Brown is a 72 y.o. female.  Chief complaint is fall, head, knee, and left hand pain.  HPI: 72 year old female.  Was walking along an uneven walkway.  Her foot stepped off of the cement.  She lost her balance and fell.  She caught herself on her left knee, left hand, and struck the right anterior forehead against the concrete.  She has bleeding and deformity from her left hand.  Bruising to her left knee.  And a goose egg on her right forehead  Loss of consciousness.  No confusion.  No nausea.  No neck or back pain.  Past Medical History:  Diagnosis Date  . Abnormal glucose   . Depression with anxiety   . Essential hypertension, benign   . Fibromyalgia   . Melanoma ([place])   . Near syncope   . Other and unspecified hyperlipidemia     Patient Active Problem List   Diagnosis Date Noted  . Closed fracture of right inferior pubic ramus ([place]) 11/18/2016  . Precordial pain 08/20/2011  . Essential hypertension, benign   . Other and unspecified hyperlipidemia     Past Surgical History:  Procedure Laterality Date  . ABDOMINAL HYSTERECTOMY    . BREAST SURGERY     LEFT  . CHOLECYSTECTOMY    . ELBOW SURGERY     X's 3 Left  . RIGHT DISTAL RADIUS FRACTURE    . TOTAL KNEE ARTHROPLASTY     RIGHT     OB History   None      Home Medications    Prior to Admission medications   Medication Sig Start Date End Date Taking? Authorizing Provider  fluticasone (FLONASE) 50 MCG/ACT nasal spray Place 2 sprays into the nose daily as needed (allergies).    Yes [provider]  furosemide (LASIX) 40 MG tablet Take 40 mg by mouth 2 (two) times daily.    Yes [provider]  gabapentin (NEURONTIN) 100 MG capsule Take 100 mg by mouth 3 (three) times daily.  09/16/17  Yes [provider]  HYDROcodone-acetaminophen (NORCO) 10-325 MG per tablet Take 1 tablet by mouth every 6 (six) hours as needed for moderate pain.  05/05/14  Yes [provider]  losartan (COZAAR) 100 MG tablet Take 100 mg by mouth daily.   Yes [provider]  meloxicam (MOBIC) 7.5 MG tablet Take 7.5 mg by mouth daily. 11/11/16  Yes [provider]  methocarbamol (ROBAXIN) 750 MG tablet Take 750 mg by mouth 2 (two) times daily.  07/18/16  Yes [provider]  pravastatin (PRAVACHOL) 80 MG tablet Take 80 mg by mouth daily.   Yes [provider]  venlafaxine XR (EFFEXOR-XR) 150 MG 24 hr capsule Take 150 mg by mouth 2 (two) times daily.    Yes [provider]  Vitamin D, Ergocalciferol, (DRISDOL) 50000 units CAPS capsule take 1 Capsule by mouth each week 11/05/16  Yes [provider]  cephALEXin (KEFLEX) 500 MG capsule Take 1 capsule (500 mg total) by mouth 3 (three) times daily. 09/29/17   Tanna Furry, MD    Family History Family History  Problem Relation Age of Onset  . Cancer Mother        lymphoma  . Heart disease Maternal Grandmother  no details    Social History Social History   Tobacco Use  . Smoking status: Former Smoker    Packs/day: 0.80    Years: 25.00    Pack years: 20.00    Types: Cigarettes    Last attempt to quit: 05/06/1994    Years since quitting: 23.4  . Smokeless tobacco: Never Used  Substance Use Topics  . Alcohol use: Not on file  . Drug use: Not on file     Allergies   Patient has no known allergies.   Review of Systems Review of Systems  Constitutional: Negative for appetite change, chills, diaphoresis, fatigue and fever.  HENT: Negative for mouth sores, sore throat and trouble swallowing.   Eyes: Negative for visual disturbance.  Respiratory: Negative for cough, chest tightness, shortness of breath and wheezing.   Cardiovascular: Negative for chest pain.    Gastrointestinal: Negative for abdominal distention, abdominal pain, diarrhea, nausea and vomiting.  Endocrine: Negative for polydipsia, polyphagia and polyuria.  Genitourinary: Negative for dysuria, frequency and hematuria.  Musculoskeletal: Negative for gait problem.       Laceration of left fourth finger.  Deformity of left fourth finger.  Pain of left fourth, and fifth finger  Skin: Negative for color change, pallor and rash.  Neurological: Positive for headaches. Negative for dizziness, syncope and light-headedness.       Forehead contusion  Hematological: Does not bruise/bleed easily.  Psychiatric/Behavioral: Negative for behavioral problems and confusion.     Physical Exam Updated Vital Signs BP (!) 153/66 (BP Location: Right Arm)   Pulse 75   Temp 99.2 F (37.3 C) (Oral)   Resp 16   SpO2 96%   Physical Exam  Constitutional: She is oriented to person, place, and time. She appears well-developed and well-nourished. No distress.  Awake and alert.  Pleasant.  Oriented and lucid.  HENT:  Head: Normocephalic.  Contusion with ecchymosis right anterior forehead.  No blood over the TMs, mastoids, or from the ears nose or mouth.  Normal extra ocular movements.  No midline neck or back pain.  Eyes: Pupils are equal, round, and reactive to light. Conjunctivae are normal. No scleral icterus.  Neck: Normal range of motion. Neck supple. No thyromegaly present.  Cardiovascular: Normal rate and regular rhythm. Exam reveals no gallop and no friction rub.  No murmur heard. Pulmonary/Chest: Effort normal and breath sounds normal. No respiratory distress. She has no wheezes. She has no rales.  Abdominal: Soft. Bowel sounds are normal. She exhibits no distension. There is no tenderness. There is no rebound.  Musculoskeletal: Normal range of motion.  Tenderness and abrasion inferior to the left knee.  Has similar laceration volar PIP left fourth digit, apparent fracture/deformity/dislocation  left fourth digit PIP.  Tenderness and early ecchymosis left fifth MCP  Capillary refill, and sensation distally.  Is wearing her ring on her left fourth finger proximal to her deformity  Neurological: She is alert and oriented to person, place, and time.  Skin: Skin is warm and dry. No rash noted.  Psychiatric: She has a normal mood and affect. Her behavior is normal.     ED Treatments / Results  Labs (all labs ordered are listed, but only abnormal results are displayed) Labs Reviewed - No data to display  EKG None  Radiology Ct Head Wo Contrast  Result Date: 09/29/2017 CLINICAL DATA:  Status post fall, right forehead trauma EXAM: CT HEAD WITHOUT CONTRAST TECHNIQUE: Contiguous axial images were obtained from the base of the skull through  the vertex without intravenous contrast. COMPARISON:  None. FINDINGS: Brain: No evidence of acute infarction, hemorrhage, hydrocephalus, extra-axial collection or mass lesion/mass effect. Mild cerebral atrophy. Vascular: No hyperdense vessel or unexpected calcification. Skull: No osseous abnormality. Sinuses/Orbits: Visualized paranasal sinuses are clear. Visualized mastoid sinuses are clear. Visualized orbits demonstrate no focal abnormality. Other: Right frontal scalp hematoma. IMPRESSION: No acute intracranial pathology. Electronically Signed   By: Kathreen Devoid   On: 09/29/2017 17:18   Dg Knee Complete 4 Views Left  Result Date: 09/29/2017 CLINICAL DATA:  Left knee pain after fall today. EXAM: LEFT KNEE - COMPLETE 4+ VIEW COMPARISON:  None. FINDINGS: No evidence of fracture, dislocation, or joint effusion. Moderate narrowing of medial joint space is noted with osteophyte formation. Mild narrowing of lateral joint space is noted with osteophyte formation. Patellar spurring is noted. Soft tissues are unremarkable. IMPRESSION: Moderate degenerative joint disease. No acute abnormality seen in the left knee. Electronically Signed   By: Marijo Conception, M.D.    On: 09/29/2017 17:31   Dg Hand Complete Left  Result Date: 09/29/2017 CLINICAL DATA:  Status post reduction of left ring finger dislocation. Initial encounter. EXAM: LEFT HAND - COMPLETE 3+ VIEW COMPARISON:  Left hand radiographs performed earlier today at 4:56 p.m. FINDINGS: There has been successful reduction of the dislocation of the left fourth proximal interphalangeal joint. The minimally displaced fracture at the base of the fifth proximal phalanx and the fracture at the distal fourth metacarpal are unchanged in appearance. Osteoarthritis is noted at the second and third distal interphalangeal joints. Degenerative change is also noted at the first carpometacarpal joint. Mild soft tissue swelling is noted about the fracture sites. IMPRESSION: 1. Successful reduction of dislocation of the left fourth proximal interphalangeal joint. 2. Stable appearance to minimally displaced fracture at the base of the fifth proximal phalanx and fracture at the distal fourth metacarpal. Electronically Signed   By: Garald Balding M.D.   On: 09/29/2017 18:12   Dg Hand Complete Left  Result Date: 09/29/2017 CLINICAL DATA:  Left fourth finger deformity after fall today. EXAM: LEFT HAND - COMPLETE 3+ VIEW COMPARISON:  None. FINDINGS: Mildly displaced fracture is seen involving the proximal base of the fifth proximal phalanx. Posterior and medial dislocation of fourth middle phalanx relative to fourth proximal phalanx is noted. Possible minimally displaced fracture involving distal fourth metacarpal is noted. Severe degenerative changes seen involving the first carpometacarpal joint, as well as the distal interphalangeal joints of the second and third fingers. IMPRESSION: Dislocation of fourth middle phalanx relative to fourth proximal phalanx. Mildly displaced fifth proximal phalangeal fracture. Possible minimally displaced fracture involving distal fourth metacarpal. Electronically Signed   By: Marijo Conception, M.D.   On:  09/29/2017 17:28    Procedures Procedures (including critical care time)  Medications Ordered in ED Medications  bupivacaine (MARCAINE) 0.5 % injection 20 mL (20 mLs Infiltration Given by Other 09/29/17 1738)  HYDROcodone-acetaminophen (NORCO/VICODIN) 5-325 MG per tablet 1 tablet (1 tablet Oral Given 09/29/17 1710)     Initial Impression / Assessment and Plan / ED Course  I have reviewed the triage vital signs and the nursing notes.  Pertinent labs & imaging results that were available during my care of the patient were reviewed by me and considered in my medical decision making (see chart for details).    CT of head normal.  Left knee x-ray normal.  X-rays of left hand show proximal dislocation of middle phalanx versus proximal phalanx left fourth digit.  Proximal radial fracture left fifth digit.  Probable nondisplaced distal left fourth meta carpal fracture  Left ring finger was anesthetized at MCP joint via ring/metacarpal block with 0.5% Marcaine with excellent analgesia.  Reduction of dislocation Date/Time: 7:02 PM Performed by: Lolita Patella Authorized by: Lolita Patella Consent: Verbal consent obtained. Risks and benefits: risks, benefits and alternatives were discussed Consent given by: patient Required items: required blood products, implants, devices, and special equipment available Time out: Immediately prior to procedure a "time out" was called to verify the correct patient, procedure, equipment, support staff and site/side marked as required.  Patient sedated: no  Vitals: Vital signs were monitored during sedation. Patient tolerance: Patient tolerated the procedure well with no immediate complications. Joint: Left hand PIP, 4th digit Reduction technique: Hyperextension, axial traction, reduction.  LACERATION REPAIR Performed by: Lolita Patella Authorized by: Lolita Patella Consent: Verbal consent obtained. Risks and benefits: risks, benefits and  alternatives were discussed Consent given by: patient Patient identity confirmed: provided demographic data Prepped and Draped in normal sterile fashion Wound explored  Laceration Location: volar LUE 4th digit at PIP  Laceration Length: 0.5cm  No Foreign Bodies seen or palpated  Anesthesia: local infiltration  Local anesthetic: Marcaine, without epinephrine  Anesthetic total: 6 ml  Irrigation method: syringe Amount of cleaning: standard  Skin closure: 4-0 nylon x 2  Number of sutures: 2  Technique: simple  Patient tolerance: Patient tolerated the procedure well with no immediate complications.  Prior to suture placement wound was irrigated with 500 cc normal saline.  I discussed the case with Dr. Gavin Pound of hand surgery.  He was agreeable to me approximating the laceration.  Will place on Keflex.  Patient was placed in a ulnar gutter splint.  Nonadherent dressing was placed over the sutures.  Splinting cases the middle, ring, and small fingers.  It is appropriately placed in her skin is protected.  She will call Dr. Charlynne Pander office tomorrow for follow-up in 1 week.    Final Clinical Impressions(s) / ED Diagnoses   Final diagnoses:  Dislocation of finger, initial encounter  Closed nondisplaced fracture of proximal phalanx of left little finger, initial encounter  Laceration of left ring finger without foreign body without damage to nail, initial encounter    ED Discharge Orders        Ordered    cephALEXin (KEFLEX) 500 MG capsule  3 times daily     09/29/17 1857       Tanna Furry, MD 09/29/17 1904

## 2017-09-29 NOTE — Progress Notes (Signed)
Orthopedic Tech Progress Note Patient Details:  Tiffany Brown Nov 02, 1945 [id number]  Ortho Devices Type of Ortho Device: Arm sling, Ulna gutter splint Ortho Device/Splint Location: lue Ortho Device/Splint Interventions: Ordered, Application, Adjustment   Post Interventions Patient Tolerated: Well Instructions Provided: Care of device, Adjustment of device   Karolee Stamps 09/29/2017, 7:54 PM

## 2018-03-19 ENCOUNTER — Other Ambulatory Visit: Payer: Self-pay | Admitting: Pain Medicine

## 2018-03-19 DIAGNOSIS — M545 Low back pain, unspecified: Secondary | ICD-10-CM

## 2018-03-19 DIAGNOSIS — M25559 Pain in unspecified hip: Secondary | ICD-10-CM

## 2018-03-23 ENCOUNTER — Other Ambulatory Visit: Payer: Self-pay | Admitting: Family Medicine

## 2018-03-23 DIAGNOSIS — Z1231 Encounter for screening mammogram for malignant neoplasm of breast: Secondary | ICD-10-CM

## 2018-04-01 ENCOUNTER — Ambulatory Visit
Admission: RE | Admit: 2018-04-01 | Discharge: 2018-04-01 | Disposition: A | Discharge status: Home / Self Care | Payer: Medicare Other | Source: Ambulatory Visit | Attending: Pain Medicine | Admitting: Pain Medicine

## 2018-04-01 DIAGNOSIS — M25559 Pain in unspecified hip: Secondary | ICD-10-CM

## 2018-04-01 DIAGNOSIS — M545 Low back pain, unspecified: Secondary | ICD-10-CM

## 2018-05-05 ENCOUNTER — Ambulatory Visit
Admission: RE | Admit: 2018-05-05 | Discharge: 2018-05-05 | Disposition: A | Discharge status: Home / Self Care | Payer: Medicare Other | Source: Ambulatory Visit | Attending: Family Medicine | Admitting: Family Medicine

## 2018-05-05 DIAGNOSIS — Z1231 Encounter for screening mammogram for malignant neoplasm of breast: Secondary | ICD-10-CM

## 2018-10-22 IMAGING — MG 2D DIGITAL SCREENING BILATERAL MAMMOGRAM WITH CAD AND ADJUNCT TO
8 of 12 series · 8 of 28 positions shown · non-contrast
Comparison: Previous exam(s).

CLINICAL DATA: Screening.

EXAM:
2D DIGITAL SCREENING BILATERAL MAMMOGRAM WITH CAD AND ADJUNCT TOMO

[R CC synth-2D]
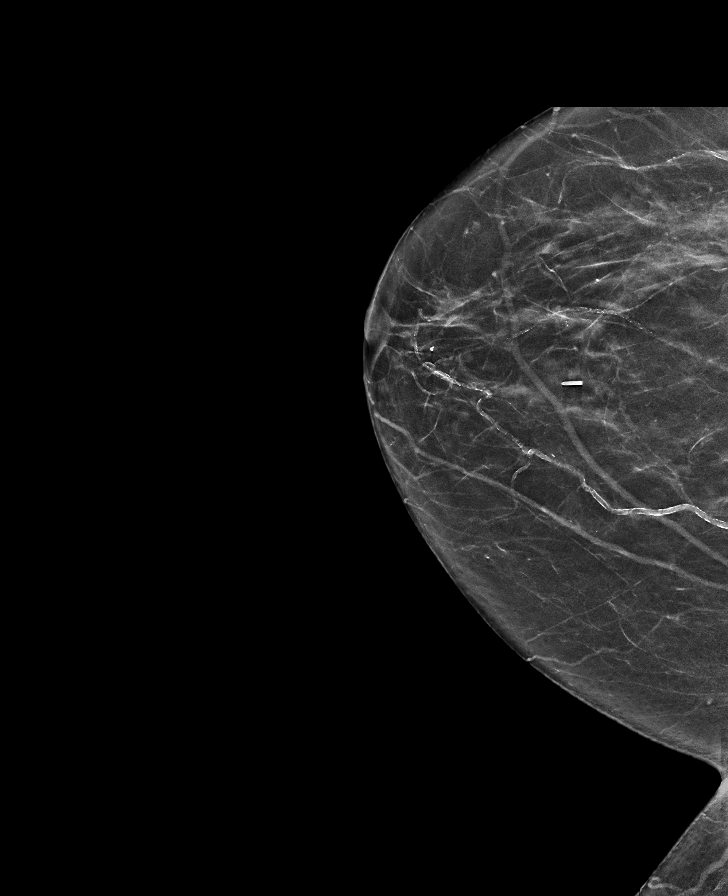

[L CC synth-2D]
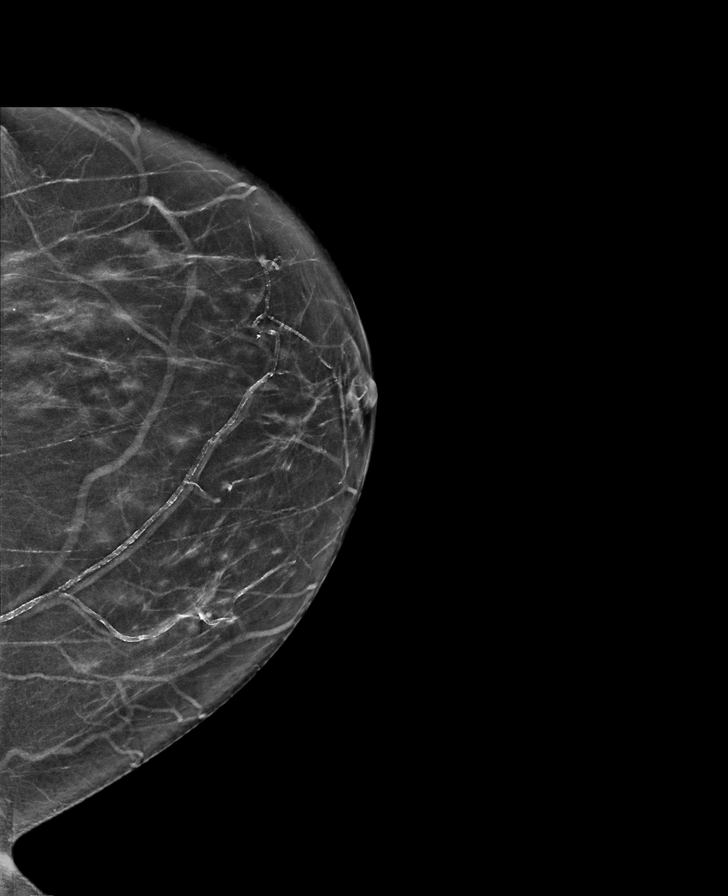

[L MLO synth-2D]
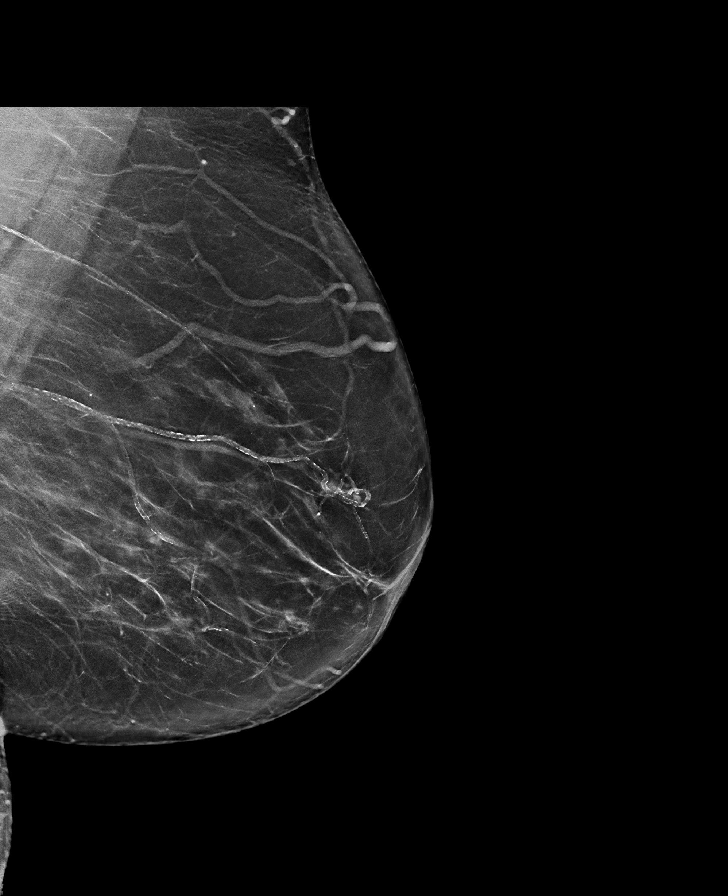

[L CC]
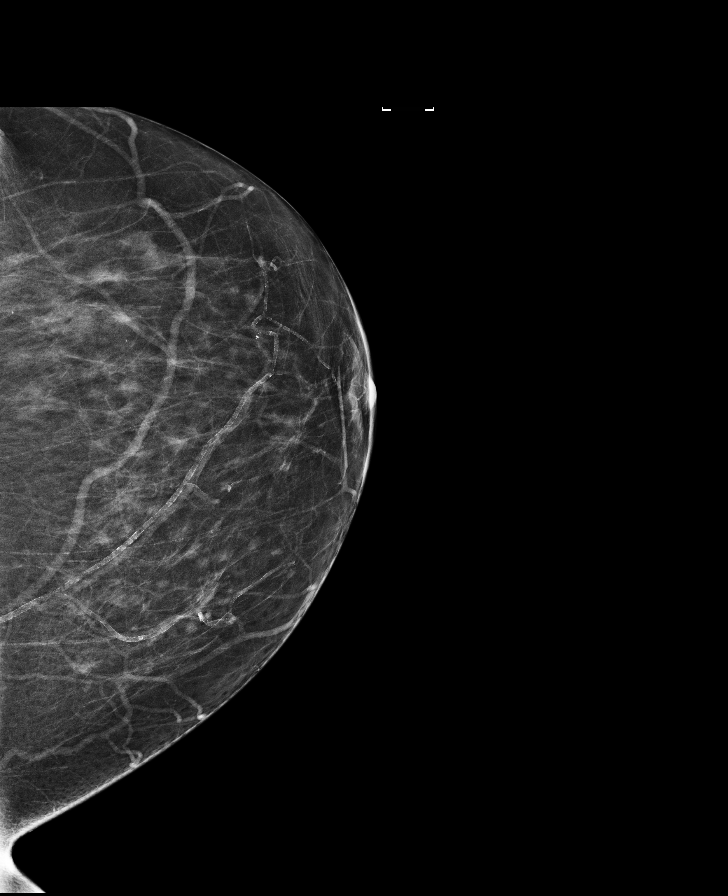

[R MLO]
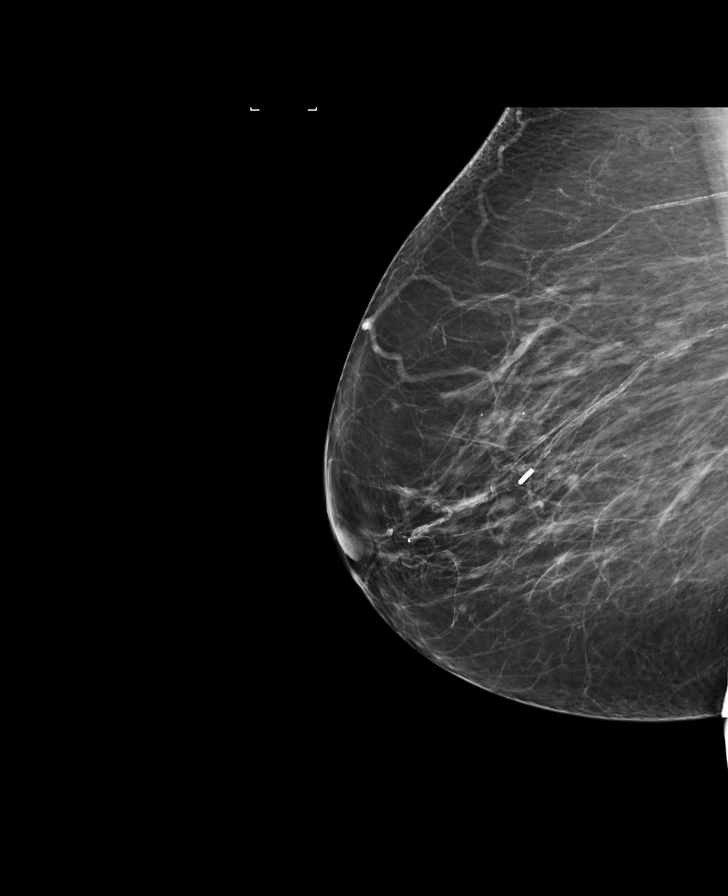

[R CC]
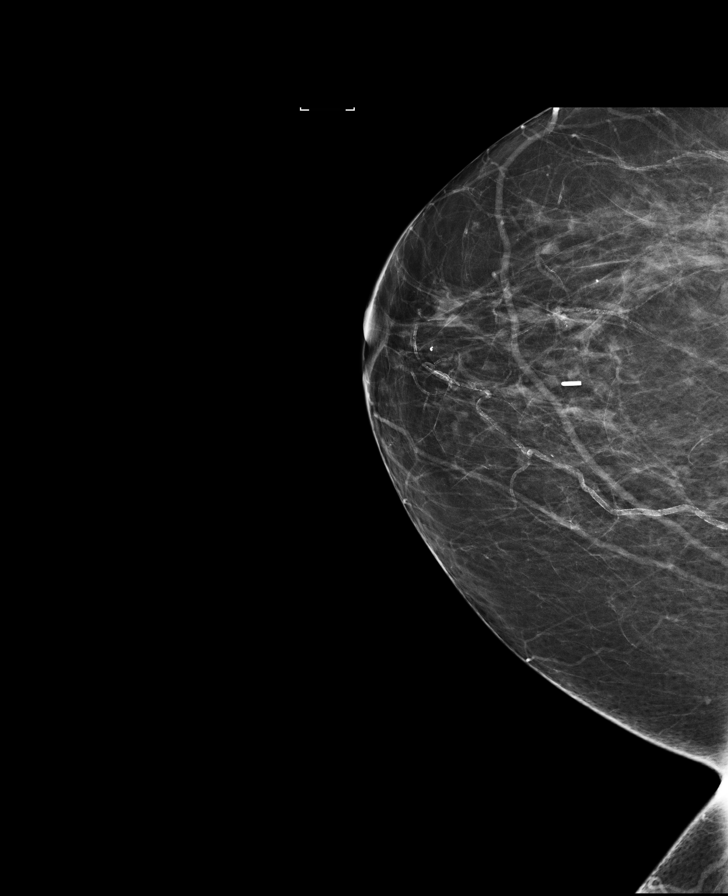

[R MLO synth-2D]
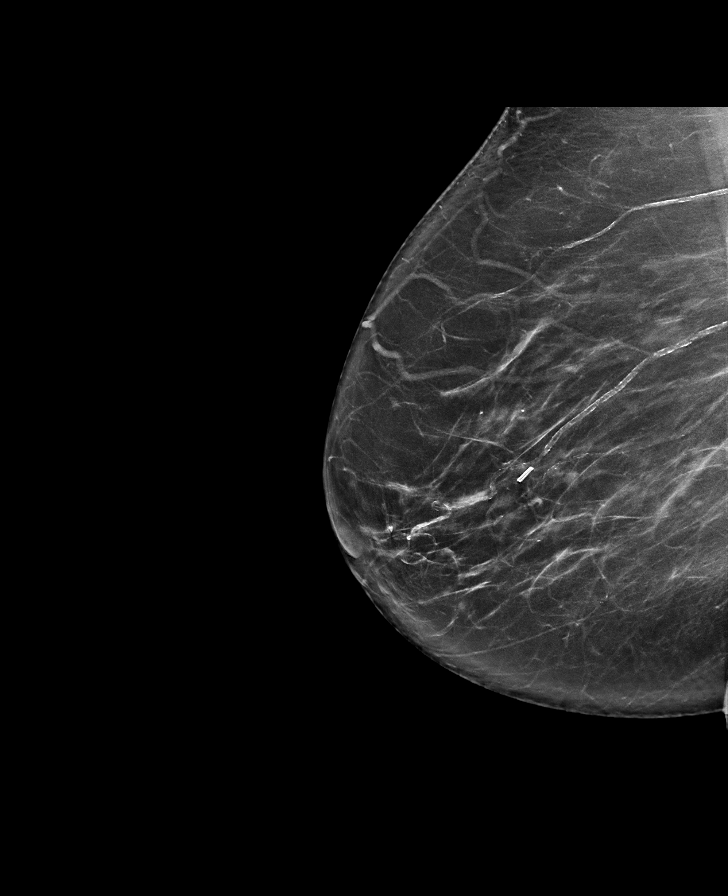

[L MLO]
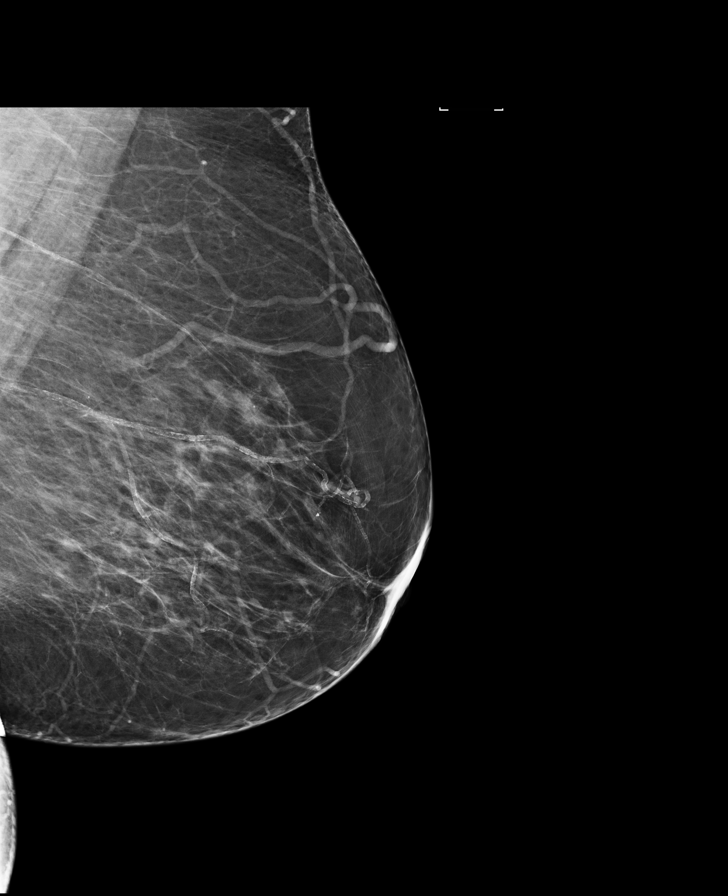

[8 of 28 positions shown; findings below may reference images not displayed]

ACR Breast Density Category b: There are scattered areas of
fibroglandular density.
FINDINGS: There are no findings suspicious for malignancy. Images were
processed with CAD.
IMPRESSION: No mammographic evidence of malignancy. A result letter of this
screening mammogram will be mailed directly to the patient.

RECOMMENDATION:
Screening mammogram in one year. (Code:97-6-RS4)

BI-RADS CATEGORY  1: Negative.

## 2018-12-15 ENCOUNTER — Telehealth: Payer: Self-pay

## 2018-12-15 NOTE — Telephone Encounter (Signed)
Requesting refill of losartan 100mg  sent to [facility]. New patient apt with [place] 8/20- [place] patient. Please advise

## 2018-12-16 NOTE — Telephone Encounter (Signed)
Unfortunately we cannot refill medicines for patients have not come to our clinic, if she has enough left than have her take half a pill every day until she comes in and sees [place].

## 2018-12-17 NOTE — Telephone Encounter (Signed)
Patient aware and verbalizes understanding. 

## 2018-12-23 ENCOUNTER — Telehealth: Payer: Self-pay | Admitting: Family Medicine

## 2018-12-23 ENCOUNTER — Other Ambulatory Visit: Payer: Self-pay

## 2018-12-23 NOTE — Telephone Encounter (Signed)
Returned call- patient states that she has been having right ear pain and cough x 2 weeks. Patient aware we can not bring her in the office due to her symptoms and our office policy.  Patient states she needs refill on pravastatin and needs something for her ear. Advised patient to go to urgent care to have her ear looked at and then follow up with [place] after symptoms has resolved.  Patient was upset crying stating she will not go to urgent care and what was she supposed to do since her doctor left.  Advised patient that I understand and we are trying to take care of her but unfortunately we have a policy we have to abide by due to covid.  Patient was crying stating - thanks anyway and hung up.

## 2018-12-23 NOTE — Telephone Encounter (Signed)
Just make her virtual tomorrow

## 2018-12-24 ENCOUNTER — Ambulatory Visit (INDEPENDENT_AMBULATORY_CARE_PROVIDER_SITE_OTHER): Payer: Medicare Other | Admitting: Family Medicine

## 2018-12-24 ENCOUNTER — Encounter: Payer: Self-pay | Admitting: Family Medicine

## 2018-12-24 DIAGNOSIS — I872 Venous insufficiency (chronic) (peripheral): Secondary | ICD-10-CM

## 2018-12-24 DIAGNOSIS — M5136 Other intervertebral disc degeneration, lumbar region: Secondary | ICD-10-CM | POA: Diagnosis not present

## 2018-12-24 DIAGNOSIS — M79604 Pain in right leg: Secondary | ICD-10-CM | POA: Diagnosis not present

## 2018-12-24 DIAGNOSIS — I10 Essential (primary) hypertension: Secondary | ICD-10-CM

## 2018-12-24 DIAGNOSIS — J0110 Acute frontal sinusitis, unspecified: Secondary | ICD-10-CM

## 2018-12-24 DIAGNOSIS — M79605 Pain in left leg: Secondary | ICD-10-CM | POA: Insufficient documentation

## 2018-12-24 DIAGNOSIS — M797 Fibromyalgia: Secondary | ICD-10-CM

## 2018-12-24 DIAGNOSIS — E782 Mixed hyperlipidemia: Secondary | ICD-10-CM

## 2018-12-24 DIAGNOSIS — E559 Vitamin D deficiency, unspecified: Secondary | ICD-10-CM | POA: Insufficient documentation

## 2018-12-24 DIAGNOSIS — R7303 Prediabetes: Secondary | ICD-10-CM

## 2018-12-24 MED ORDER — PRAVASTATIN SODIUM 80 MG PO TABS: 80.0000 mg | ORAL_TABLET | Freq: Every day | ORAL | 3 refills | Status: DC

## 2018-12-24 MED ORDER — AZITHROMYCIN 250 MG PO TABS: ORAL_TABLET | ORAL | 0 refills | Status: DC

## 2018-12-24 MED ORDER — MELOXICAM 7.5 MG PO TABS: 7.5000 mg | ORAL_TABLET | Freq: Every day | ORAL | 3 refills | Status: DC

## 2018-12-24 MED ORDER — VENLAFAXINE HCL ER 150 MG PO CP24: 150.0000 mg | ORAL_CAPSULE | Freq: Three times a day (TID) | ORAL | 3 refills | Status: DC

## 2018-12-24 MED ORDER — LOSARTAN POTASSIUM 100 MG PO TABS: 100.0000 mg | ORAL_TABLET | Freq: Every day | ORAL | 3 refills | Status: DC

## 2018-12-24 MED ORDER — VITAMIN D (ERGOCALCIFEROL) 1.25 MG (50000 UNIT) PO CAPS: 50000.0000 [IU] | ORAL_CAPSULE | ORAL | 3 refills | Status: DC

## 2018-12-24 MED ORDER — ESCITALOPRAM OXALATE 20 MG PO TABS: 20.0000 mg | ORAL_TABLET | Freq: Every day | ORAL | 3 refills | Status: DC

## 2018-12-24 MED ORDER — GABAPENTIN 100 MG PO CAPS: 100.0000 mg | ORAL_CAPSULE | Freq: Three times a day (TID) | ORAL | 3 refills | Status: DC

## 2018-12-24 MED ORDER — FUROSEMIDE 40 MG PO TABS: 40.0000 mg | ORAL_TABLET | Freq: Two times a day (BID) | ORAL | 3 refills | Status: DC | PRN

## 2018-12-24 NOTE — Progress Notes (Signed)
Virtual Visit via telephone Note  I connected with Tiffany Brown on 12/24/18 at 0951 by telephone and verified that I am speaking with the correct person using two identifiers. Tiffany Brown is currently located at home and no other people are currently with her during visit. The provider, Fransisca Kaufmann Lukus Binion, MD is located in their office at time of visit.  Call ended at 1020  I discussed the limitations, risks, security and privacy concerns of performing an evaluation and management service by telephone and the availability of in person appointments. I also discussed with the patient that there may be a patient responsible charge related to this service. The patient expressed understanding and agreed to proceed.   History and Present Illness: Patient is having ear pressure and congestion and sinus pressure that has been going on for 2 weeks and she has been using benadryl and flonase.  She denies fevers or chills or sick contacts that she knows of.  Prediabetes Patient comes in today for recheck of his diabetes. Patient has been currently taking no medication and has been diet controlled her last A1c was 6.0 in January. Patient is currently on an ACE inhibitor/ARB. Patient has not seen an ophthalmologist this year. Patient denies any issues with their feet.   Hypertension Patient is currently on losartan, and their blood pressure today is unknown because the appointment was a virtual appointment. Patient denies any lightheadedness or dizziness. Patient denies headaches, blurred vision, chest pains, shortness of breath, or weakness. Denies any side effects from medication and is content with current medication.   Fibromyalgia and low back pain  Patient has fibromyalgia and chronic low back pain.  She sees a pain management doctor and gets hydrocodone for them and then she also takes the gabapentin and the Effexor to help with this and she seems to be feeling like they do pretty well, she  does get injections sometimes through the pain management as well.,  She also takes meloxicam as well for this.  Stasis dermatitis patient takes lasix for this and takes 1 times per day and sometimes twice per day.   Past Medical History:  Diagnosis Date  . Abnormal glucose   . Depression with anxiety   . Essential hypertension, benign   . Fibromyalgia   . Melanoma ([place])   . Near syncope   . Other and unspecified hyperlipidemia      Relationships  Social connections  . Talks on phone: Not on file  . Gets together: Not on file  . Attends religious service: Not on file  . Active member of club or organization: Not on file  . Attends meetings of clubs or organizations: Not on file  . Relationship status: Not on file     Past Surgical History:  Procedure Laterality Date  . ABDOMINAL HYSTERECTOMY    . BREAST SURGERY     LEFT  . CHOLECYSTECTOMY    . ELBOW SURGERY     X's 3 Left  . RIGHT DISTAL RADIUS FRACTURE    . TOTAL KNEE ARTHROPLASTY     RIGHT     No diagnosis found.  Outpatient Encounter Medications as of 12/24/2018  Medication Sig  . cephALEXin (KEFLEX) 500 MG capsule Take 1 capsule (500 mg total) by mouth 3 (three) times daily.  . fluticasone (FLONASE) 50 MCG/ACT nasal spray Place 2 sprays into the nose daily as needed (allergies).   . furosemide (LASIX) 40 MG tablet Take 40 mg by mouth 2 (two) times  daily.   . gabapentin (NEURONTIN) 100 MG capsule Take 100 mg by mouth 3 (three) times daily.  Marland Kitchen HYDROcodone-acetaminophen (NORCO) 10-325 MG per tablet Take 1 tablet by mouth every 6 (six) hours as needed for moderate pain.   Marland Kitchen losartan (COZAAR) 100 MG tablet Take 100 mg by mouth daily.  . meloxicam (MOBIC) 7.5 MG tablet Take 7.5 mg by mouth daily.  . methocarbamol (ROBAXIN) 750 MG tablet Take 750 mg by mouth 2 (two) times daily.   . pravastatin (PRAVACHOL) 80 MG tablet Take 80 mg by mouth daily.  Marland Kitchen venlafaxine XR (EFFEXOR-XR) 150 MG 24 hr capsule Take 150 mg by mouth 2  (two) times daily.   . Vitamin D, Ergocalciferol, (DRISDOL) 50000 units CAPS capsule take 1 Capsule by mouth each week   No facility-administered encounter medications on file as of 12/24/2018.     Review of Systems  Constitutional: Negative for chills and fever.  HENT: Positive for congestion, ear pain, postnasal drip, rhinorrhea, sinus pressure, sneezing and sore throat. Negative for ear discharge.   Eyes: Negative for pain, redness and visual disturbance.  Respiratory: Positive for cough. Negative for chest tightness, shortness of breath and wheezing.   Cardiovascular: Negative for chest pain and leg swelling.  Genitourinary: Negative for difficulty urinating and dysuria.  Musculoskeletal: Negative for back pain and gait problem.  Skin: Negative for rash.  Neurological: Positive for headaches. Negative for dizziness and light-headedness.  Psychiatric/Behavioral: Negative for agitation and behavioral problems.  All other systems reviewed and are negative.   Observations/Objective: Patient sounds comfortable and in no acute distress  Assessment and Plan: Problem List Items Addressed This Visit      Cardiovascular and Mediastinum   Essential hypertension, benign - Primary   Relevant Medications   pravastatin (PRAVACHOL) 80 MG tablet   losartan (COZAAR) 100 MG tablet   furosemide (LASIX) 40 MG tablet   Other Relevant Orders   CBC with Differential/Platelet   CMP14+EGFR     Musculoskeletal and Integument   Stasis dermatitis of both legs   Degenerative disc disease, lumbar   Relevant Medications   meloxicam (MOBIC) 7.5 MG tablet     Other   Hyperlipidemia   Relevant Medications   pravastatin (PRAVACHOL) 80 MG tablet   losartan (COZAAR) 100 MG tablet   furosemide (LASIX) 40 MG tablet   Other Relevant Orders   Lipid panel   Leg pain, bilateral   Fibromyalgia   Relevant Medications   venlafaxine XR (EFFEXOR-XR) 150 MG 24 hr capsule   meloxicam (MOBIC) 7.5 MG tablet    gabapentin (NEURONTIN) 100 MG capsule   escitalopram (LEXAPRO) 20 MG tablet   Other Relevant Orders   CBC with Differential/Platelet   Vitamin D deficiency   Relevant Orders   VITAMIN D 25 Hydroxy (Vit-D Deficiency, Fractures)   Prediabetes   Relevant Orders   Bayer DCA Hb A1c Waived    Other Visit Diagnoses    Acute frontal sinusitis, recurrence not specified       Relevant Medications   azithromycin (ZITHROMAX) 250 MG tablet       Follow Up Instructions:  Follow up in 3 months  No changes in current medication, will see back in 3 months and discuss at that time.     I discussed the assessment and treatment plan with the patient. The patient was provided an opportunity to ask questions and all were answered. The patient agreed with the plan and demonstrated an understanding of the instructions.   The  patient was advised to call back or seek an in-person evaluation if the symptoms worsen or if the condition fails to improve as anticipated.  The above assessment and management plan was discussed with the patient. The patient verbalized understanding of and has agreed to the management plan. Patient is aware to call the clinic if symptoms persist or worsen. Patient is aware when to return to the clinic for a follow-up visit. Patient educated on when it is appropriate to go to the emergency department.    I provided 29 minutes of non-face-to-face time during this encounter.  Pain center Dr [facility] Oka in [place], [place]    Worthy Rancher, MD

## 2018-12-24 NOTE — Telephone Encounter (Signed)
Scheduled

## 2019-01-04 ENCOUNTER — Encounter: Payer: Self-pay | Admitting: Family Medicine

## 2019-01-04 ENCOUNTER — Ambulatory Visit (INDEPENDENT_AMBULATORY_CARE_PROVIDER_SITE_OTHER): Payer: Medicare Other | Admitting: Family Medicine

## 2019-01-04 DIAGNOSIS — J0110 Acute frontal sinusitis, unspecified: Secondary | ICD-10-CM

## 2019-01-04 MED ORDER — AMOXICILLIN-POT CLAVULANATE 875-125 MG PO TABS: 1.0000 | ORAL_TABLET | Freq: Two times a day (BID) | ORAL | 0 refills | Status: DC

## 2019-01-04 NOTE — Progress Notes (Signed)
    Subjective:    Patient ID: Tiffany Brown, female    DOB: Jan 23, 1946, 73 y.o.   MRN: [id number]   HPI: Tiffany Brown is a 73 y.o. female presenting for Symptoms include congestion, frontal pain, nasal congestion, non productive cough, post nasal drip and sinus pressure. There is no fever, chills, or sweats. Onset of symptoms was a two weeks ago, gradually worsening since that time in spite of zithromax prescribed 11 days ago..    Relevant past medical, surgical, family and social history reviewed and updated as indicated.  Interim medical history since our last visit reviewed. Allergies and medications reviewed and updated.  ROS:  Review of Systems  Constitutional: Negative for activity change, appetite change, chills and fever.  HENT: Positive for congestion, ear pain (stuffy on the right), postnasal drip, rhinorrhea and sinus pressure. Negative for ear discharge, hearing loss, nosebleeds, sneezing and trouble swallowing.   Respiratory: Negative for chest tightness and shortness of breath.   Skin: Negative for rash.  Neurological: Positive for headaches.     Social History   Tobacco Use  Smoking Status Former Smoker  . Packs/day: 0.80  . Years: 25.00  . Pack years: 20.00  . Types: Cigarettes  . Quit date: 05/06/1994  . Years since quitting: 24.6  Smokeless Tobacco Never Used       Objective:     Wt Readings from Last 3 Encounters:  07/20/14 251 lb (113.9 kg)  08/20/11 245 lb (111.1 kg)     Exam deferred. Pt. Harboring due to COVID 19. Phone visit performed.   Assessment & Plan:   1. Acute non-recurrent frontal sinusitis     Meds ordered this encounter  Medications  . amoxicillin-clavulanate (AUGMENTIN) 875-125 MG tablet    Sig: Take 1 tablet by mouth 2 (two) times daily.    Dispense:  20 tablet    Refill:  0    Use mucinex D for HA & cngestion    Diagnoses and all orders for this visit:  Acute non-recurrent frontal sinusitis  Other orders -      amoxicillin-clavulanate (AUGMENTIN) 875-125 MG tablet; Take 1 tablet by mouth 2 (two) times daily.    Virtual Visit via telephone Note  I discussed the limitations, risks, security and privacy concerns of performing an evaluation and management service by telephone and the availability of in person appointments. The patient was identified with two identifiers. Pt.expressed understanding and agreed to proceed. Pt. Is at home. Dr. Livia Snellen is in his office.  Follow Up Instructions:   I discussed the assessment and treatment plan with the patient. The patient was provided an opportunity to ask questions and all were answered. The patient agreed with the plan and demonstrated an understanding of the instructions.   The patient was advised to call back or seek an in-person evaluation if the symptoms worsen or if the condition fails to improve as anticipated.   Total minutes including chart review and phone contact time: 9   Follow up plan: No follow-ups on file.  Tiffany Fraise, MD [place]

## 2019-03-03 ENCOUNTER — Other Ambulatory Visit: Payer: Self-pay | Admitting: Family Medicine

## 2019-03-09 ENCOUNTER — Other Ambulatory Visit: Payer: Self-pay

## 2019-03-09 ENCOUNTER — Other Ambulatory Visit: Payer: Medicare Other

## 2019-03-09 DIAGNOSIS — R7303 Prediabetes: Secondary | ICD-10-CM

## 2019-03-09 DIAGNOSIS — I10 Essential (primary) hypertension: Secondary | ICD-10-CM

## 2019-03-09 DIAGNOSIS — E782 Mixed hyperlipidemia: Secondary | ICD-10-CM

## 2019-03-09 DIAGNOSIS — E559 Vitamin D deficiency, unspecified: Secondary | ICD-10-CM

## 2019-03-09 LAB — BAYER DCA HB A1C WAIVED: HB A1C (BAYER DCA - WAIVED): 6.5 % (ref <7.0)

## 2019-03-10 LAB — CMP14+EGFR
ALT: 29 IU/L (ref 0–32)
AST: 28 IU/L (ref 0–40)
Albumin/Globulin Ratio: 1.5 (ref 1.2–2.2)
Albumin: 4.1 g/dL (ref 3.7–4.7)
Alkaline Phosphatase: 107 IU/L (ref 39–117)
BUN/Creatinine Ratio: 25 (ref 12–28)
BUN: 19 mg/dL (ref 8–27)
Bilirubin Total: 0.4 mg/dL (ref 0.0–1.2)
CO2: 29 mmol/L (ref 20–29)
Calcium: 9.4 mg/dL (ref 8.7–10.3)
Chloride: 103 mmol/L (ref 96–106)
Creatinine, Ser: 0.77 mg/dL (ref 0.57–1.00)
GFR calc Af Amer: 88 mL/min/{1.73_m2} (ref >59)
GFR calc non Af Amer: 76 mL/min/{1.73_m2} (ref >59)
Globulin, Total: 2.7 g/dL (ref 1.5–4.5)
Glucose: 117 mg/dL — ABNORMAL HIGH (ref 65–99)
Potassium: 4.3 mmol/L (ref 3.5–5.2)
Sodium: 144 mmol/L (ref 134–144)
Total Protein: 6.8 g/dL (ref 6.0–8.5)

## 2019-03-10 LAB — CBC WITH DIFFERENTIAL/PLATELET
Basophils Absolute: 0 10*3/uL (ref 0.0–0.2)
Basos: 0 %
EOS (ABSOLUTE): 0.1 10*3/uL (ref 0.0–0.4)
Eos: 0 %
Hematocrit: 40.6 % (ref 34.0–46.6)
Hemoglobin: 14.1 g/dL (ref 11.1–15.9)
Immature Grans (Abs): 0 10*3/uL (ref 0.0–0.1)
Immature Granulocytes: 0 %
Lymphocytes Absolute: 3.2 10*3/uL — ABNORMAL HIGH (ref 0.7–3.1)
Lymphs: 27 %
MCH: 32.9 pg (ref 26.6–33.0)
MCHC: 34.7 g/dL (ref 31.5–35.7)
MCV: 95 fL (ref 79–97)
Monocytes Absolute: 0.6 10*3/uL (ref 0.1–0.9)
Monocytes: 5 %
Neutrophils Absolute: 7.9 10*3/uL — ABNORMAL HIGH (ref 1.4–7.0)
Neutrophils: 68 %
Platelets: 205 10*3/uL (ref 150–450)
RBC: 4.28 x10E6/uL (ref 3.77–5.28)
RDW: 13.5 % (ref 11.7–15.4)
WBC: 11.8 10*3/uL — ABNORMAL HIGH (ref 3.4–10.8)

## 2019-03-10 LAB — LIPID PANEL
Chol/HDL Ratio: 3.5 ratio (ref 0.0–4.4)
Cholesterol, Total: 176 mg/dL (ref 100–199)
HDL: 50 mg/dL (ref >39)
LDL Chol Calc (NIH): 97 mg/dL (ref 0–99)
Triglycerides: 167 mg/dL — ABNORMAL HIGH (ref 0–149)
VLDL Cholesterol Cal: 29 mg/dL (ref 5–40)

## 2019-03-29 ENCOUNTER — Other Ambulatory Visit: Payer: Self-pay | Admitting: Family Medicine

## 2019-03-29 DIAGNOSIS — Z1231 Encounter for screening mammogram for malignant neoplasm of breast: Secondary | ICD-10-CM

## 2019-03-30 ENCOUNTER — Other Ambulatory Visit: Payer: Self-pay

## 2019-03-31 ENCOUNTER — Ambulatory Visit (INDEPENDENT_AMBULATORY_CARE_PROVIDER_SITE_OTHER): Payer: Medicare Other | Admitting: Family Medicine

## 2019-03-31 ENCOUNTER — Encounter: Payer: Self-pay | Admitting: Family Medicine

## 2019-03-31 VITALS — BP 175/86 | HR 76 | Temp 97.3°F | Ht 65.5 in | Wt 265.6 lb

## 2019-03-31 DIAGNOSIS — E1169 Type 2 diabetes mellitus with other specified complication: Secondary | ICD-10-CM

## 2019-03-31 DIAGNOSIS — E782 Mixed hyperlipidemia: Secondary | ICD-10-CM | POA: Diagnosis not present

## 2019-03-31 DIAGNOSIS — E785 Hyperlipidemia, unspecified: Secondary | ICD-10-CM

## 2019-03-31 DIAGNOSIS — M797 Fibromyalgia: Secondary | ICD-10-CM

## 2019-03-31 DIAGNOSIS — F3342 Major depressive disorder, recurrent, in full remission: Secondary | ICD-10-CM | POA: Diagnosis not present

## 2019-03-31 DIAGNOSIS — I10 Essential (primary) hypertension: Secondary | ICD-10-CM

## 2019-03-31 DIAGNOSIS — R296 Repeated falls: Secondary | ICD-10-CM

## 2019-03-31 DIAGNOSIS — R2689 Other abnormalities of gait and mobility: Secondary | ICD-10-CM

## 2019-03-31 DIAGNOSIS — R531 Weakness: Secondary | ICD-10-CM

## 2019-03-31 MED ORDER — DULOXETINE HCL 60 MG PO CPEP: 60.0000 mg | ORAL_CAPSULE | Freq: Every day | ORAL | 2 refills | Status: DC

## 2019-03-31 MED ORDER — AMLODIPINE BESYLATE 5 MG PO TABS: 5.0000 mg | ORAL_TABLET | Freq: Every day | ORAL | 3 refills | Status: DC

## 2019-03-31 NOTE — Addendum Note (Signed)
Addended by: Caryl Pina on: 03/31/2019 09:12 AM   Modules accepted: Orders

## 2019-03-31 NOTE — Progress Notes (Signed)
BP (!) 175/86   Pulse 76   Temp (!) 97.3 F (36.3 C) (Temporal)   Ht 5' 5.5" (1.664 m)   Wt 265 lb 9.6 oz (120.5 kg)   SpO2 98%   BMI 43.53 kg/m    Subjective:   Patient ID: Tiffany Brown, female    DOB: [birth year], 73 y.o.   MRN: [id number]  HPI: Tiffany Brown is a 73 y.o. female presenting on 03/31/2019 for Hypertension (3 month follow up)   HPI Type 2 diabetes mellitus Patient comes in today for recheck of his diabetes. Patient has been currently taking no medication and A1c was 6.5.  Continue diet control. Patient is currently on an ACE inhibitor/ARB. Patient has not seen an ophthalmologist this year. Patient has stasis dermatitis and some neuropathy in her legs.  Hypertension Patient is currently on losartan, and their blood pressure today is 175/86. Patient denies any lightheadedness or dizziness. Patient denies headaches, blurred vision, chest pains, shortness of breath, or weakness. Denies any side effects from medication and is content with current medication.   Hyperlipidemia Patient is coming in for recheck of his hyperlipidemia. The patient is currently taking pravastatin. They deny any issues with myalgias or history of liver damage from it. They deny any focal numbness or weakness or chest pain.   Patient has been having increased difficulty with strength and weakness and falls over the past few months that has been worsening over the pandemic because she stuck at home more.  Patient is coming in for recheck of anxiety and depression.  She states they discontinued her Effexor and tapered her off of it and started on Cymbalta and she feels like it helped some but then she is tapering down.  She feels like she is down and sad a lot and crying more often.  Relevant past medical, surgical, family and social history reviewed and updated as indicated. Interim medical history since our last visit reviewed. Allergies and medications reviewed and updated.  Review of  Systems  Constitutional: Negative for chills and fever.  HENT: Negative for congestion, ear discharge and ear pain.   Eyes: Negative for redness and visual disturbance.  Respiratory: Negative for chest tightness and shortness of breath.   Cardiovascular: Positive for leg swelling. Negative for chest pain.  Musculoskeletal: Positive for arthralgias, back pain and myalgias. Negative for gait problem.  Skin: Negative for rash.  Neurological: Positive for weakness and numbness. Negative for light-headedness and headaches.  Psychiatric/Behavioral: Negative for agitation and behavioral problems.  All other systems reviewed and are negative.   Per HPI unless specifically indicated above   Allergies as of 03/31/2019      Reactions   Other       Medication List       Accurate as of March 31, 2019  8:13 AM. If you have any questions, ask your nurse or doctor.        STOP taking these medications   amoxicillin-clavulanate 875-125 MG tablet Commonly known as: AUGMENTIN Stopped by: Fransisca Kaufmann Dvontae Ruan, MD   betamethasone dipropionate 0.05 % cream Stopped by: Worthy Rancher, MD   escitalopram 20 MG tablet Commonly known as: LEXAPRO Stopped by: Fransisca Kaufmann Stacyann Mcconaughy, MD   gabapentin 100 MG capsule Commonly known as: NEURONTIN Stopped by: Fransisca Kaufmann Dhruv Christina, MD   venlafaxine XR 150 MG 24 hr capsule Commonly known as: EFFEXOR-XR Stopped by: Fransisca Kaufmann Tiffany Chinchilla, MD     TAKE these medications   DULoxetine 30 MG capsule Commonly  known as: CYMBALTA Take 30 mg by mouth daily.   fluticasone 50 MCG/ACT nasal spray Commonly known as: FLONASE Place 2 sprays into the nose daily as needed (allergies).   furosemide 40 MG tablet Commonly known as: LASIX Take 1 tablet (40 mg total) by mouth 2 (two) times daily as needed.   HYDROcodone-acetaminophen 10-325 MG tablet Commonly known as: NORCO Take 1 tablet by mouth every 6 (six) hours as needed for moderate pain.   losartan 100 MG  tablet Commonly known as: COZAAR Take 1 tablet (100 mg total) by mouth daily.   meloxicam 7.5 MG tablet Commonly known as: MOBIC Take 1 tablet (7.5 mg total) by mouth daily.   pravastatin 80 MG tablet Commonly known as: PRAVACHOL Take 1 tablet (80 mg total) by mouth daily.   Vitamin D (Ergocalciferol) 1.25 MG (50000 UT) Caps capsule Commonly known as: DRISDOL Take 1 capsule (50,000 Units total) by mouth every 7 (seven) days.        Objective:   BP (!) 175/86   Pulse 76   Temp (!) 97.3 F (36.3 C) (Temporal)   Ht 5' 5.5" (1.664 m)   Wt 265 lb 9.6 oz (120.5 kg)   SpO2 98%   BMI 43.53 kg/m   Wt Readings from Last 3 Encounters:  03/31/19 265 lb 9.6 oz (120.5 kg)  07/20/14 251 lb (113.9 kg)  08/20/11 245 lb (111.1 kg)    Physical Exam Vitals signs and nursing note reviewed.  Constitutional:      General: She is not in acute distress.    Appearance: She is well-developed. She is not diaphoretic.  Eyes:     Conjunctiva/sclera: Conjunctivae normal.  Cardiovascular:     Rate and Rhythm: Normal rate and regular rhythm.     Heart sounds: Normal heart sounds. No murmur.  Pulmonary:     Effort: Pulmonary effort is normal. No respiratory distress.     Breath sounds: Normal breath sounds. No wheezing.  Musculoskeletal: Normal range of motion.        General: No tenderness.  Skin:    General: Skin is warm and dry.     Findings: No rash.  Neurological:     Mental Status: She is alert and oriented to person, place, and time.     Coordination: Coordination normal.  Psychiatric:        Mood and Affect: Mood is anxious and depressed.        Behavior: Behavior normal.        Thought Content: Thought content does not include suicidal ideation. Thought content does not include suicidal plan.       Assessment & Plan:   Problem List Items Addressed This Visit      Cardiovascular and Mediastinum   Essential hypertension, benign     Endocrine   DM type 2 with diabetic  dyslipidemia ([place]) - Primary     Other   Hyperlipidemia   Fibromyalgia   Relevant Medications   DULoxetine (CYMBALTA) 60 MG capsule   Recurrent major depressive disorder, in full remission ([place])   Relevant Medications   DULoxetine (CYMBALTA) 60 MG capsule      We have increased her Cymbalta to 60 mg and added amlodipine.  Otherwise continue current medication. Follow up plan: Return in about 3 months (around 07/01/2019), or if symptoms worsen or fail to improve, for Patient needs 31-month recheck diabetes, also 4-week phone visit for depression.  Counseling provided for all of the vaccine components No orders of the  defined types were placed in this encounter.   Caryl Pina, MD [facility] 03/31/2019, 8:13 AM

## 2019-04-07 ENCOUNTER — Other Ambulatory Visit: Payer: Self-pay

## 2019-04-07 ENCOUNTER — Ambulatory Visit: Payer: Medicare Other | Attending: Family Medicine | Admitting: Physical Therapy

## 2019-04-07 DIAGNOSIS — M25561 Pain in right knee: Secondary | ICD-10-CM | POA: Diagnosis not present

## 2019-04-07 DIAGNOSIS — R296 Repeated falls: Secondary | ICD-10-CM | POA: Diagnosis present

## 2019-04-07 DIAGNOSIS — M25661 Stiffness of right knee, not elsewhere classified: Secondary | ICD-10-CM | POA: Diagnosis not present

## 2019-04-07 DIAGNOSIS — M6281 Muscle weakness (generalized): Secondary | ICD-10-CM | POA: Insufficient documentation

## 2019-04-07 DIAGNOSIS — G8929 Other chronic pain: Secondary | ICD-10-CM | POA: Diagnosis not present

## 2019-04-07 DIAGNOSIS — R2681 Unsteadiness on feet: Secondary | ICD-10-CM | POA: Insufficient documentation

## 2019-04-07 NOTE — Therapy (Addendum)
[facility] [place], [place], [address] Phone: [phone]   Fax:  [phone]  Physical Therapy Evaluation  Patient Details  Name: Tiffany Brown MRN: [id number] Date of Birth: July 14, 1945 Referring Provider (PT): Caryl Pina, MD   Encounter Date: 04/07/2019  PT End of Session - 04/07/19 1317    Visit Number  1    Number of Visits  12    Date for PT Re-Evaluation  07/06/19    Authorization Type  Progress note every 10th visit; KX modifier at 15th visit    PT Start Time  0901    PT Stop Time  0946    PT Time Calculation (min)  45 min    Equipment Utilized During Treatment  Other (comment)   hurry cane   Activity Tolerance  Patient tolerated treatment well    Behavior During Therapy  [facility] for tasks assessed/performed       Past Medical History:  Diagnosis Date  . Abnormal glucose   . Depression with anxiety   . Essential hypertension, benign   . Fibromyalgia   . Melanoma ([place])   . Near syncope   . Other and unspecified hyperlipidemia     Past Surgical History:  Procedure Laterality Date  . ABDOMINAL HYSTERECTOMY    . BREAST SURGERY     LEFT  . CHOLECYSTECTOMY    . ELBOW SURGERY     X's 3 Left  . RIGHT DISTAL RADIUS FRACTURE    . TOTAL KNEE ARTHROPLASTY     RIGHT    There were no vitals filed for this visit.   Subjective Assessment - 04/07/19 1315    Subjective  COVID-19 screening performed upon arrival. Patient arrives to physical therapy with reports of bilateral LE weakness, loss of balance, and frequent falls that has progressed over the past couple of months. Patient reported three falls in the past 6 months with most recent fall occurring when she lost her balance while walking in her home. Patient reports feeling dizzy and unstable particularly while walking. She ambulates with a hurry cane in the community but does not utilize during home ambulation but relies on furniture and walls for  support. Patient also utilizes a walker especially after a fall. Patient reports a history of low back pain and bilateral knee pain that also limits her balance and her standing tolerance. Patient's goals are to improve movement, improve strength, improve ability to perform ADLs and home activities.    Pertinent History  HTN, frequent falls, Chronic lower back and bilateral knee pain, Stasis dermatitis    Limitations  Walking;Standing;House hold activities    Patient Stated Goals  improve balance, feel more confident with walking    Currently in Pain?  Yes    Pain Score  6     Pain Location  Back    Pain Orientation  Lower    Pain Descriptors / Indicators  Sore    Pain Type  Chronic pain    Pain Onset  More than a month ago    Pain Frequency  Constant         [facility] PT Assessment - 04/07/19 0001      Assessment   Medical Diagnosis  Recurrent falls, Weakness, Balance Disorder    Referring Provider (PT)  Caryl Pina, MD    Onset Date/Surgical Date  --   ongoing   Next MD Visit  February 2021    Prior Therapy  no      Precautions  Precautions  Fall      Restrictions   Weight Bearing Restrictions  No      Balance Screen   Has the patient fallen in the past 6 months  Yes    How many times?  3    Has the patient had a decrease in activity level because of a fear of falling?   Yes    Is the patient reluctant to leave their home because of a fear of falling?   Yes      [place]  Private residence    Living Arrangements  Spouse/significant other    Type of [place] to enter    Entrance Stairs-Number of Steps  3    Entrance Stairs-Rails  Left    [place]  One level      Prior Function   Level of Independence  Independent with basic ADLs      ROM / Strength   AROM / PROM / Strength  Strength      Strength   Strength Assessment Site  Knee;Ankle    Right/Left Knee  Right;Left    Right Knee Flexion  4/5    Right  Knee Extension  4-/5    Left Knee Flexion  4/5    Left Knee Extension  4-/5    Right/Left Ankle  Right;Left    Right Ankle Dorsiflexion  4/5    Left Ankle Dorsiflexion  4/5      Transfers   Five time sit to stand comments   27 seconds modified with UE support      Ambulation/Gait   Assistive device  Straight cane   hurry cane   Gait Pattern  Decreased stride length;Decreased hip/knee flexion - right;Shuffle;Poor foot clearance - right      Standardized Balance Assessment   Standardized Balance Assessment  Berg Balance Test      Berg Balance Test   Sit to Stand  Able to stand  independently using hands    Standing Unsupported  Able to stand safely 2 minutes    Sitting with Back Unsupported but Feet Supported on Floor or Stool  Able to sit safely and securely 2 minutes    Stand to Sit  Controls descent by using hands    Transfers  Able to transfer safely, definite need of hands    Standing Unsupported with Eyes Closed  Able to stand 10 seconds with supervision    Standing Unsupported with Feet Together  Able to place feet together independently and stand for 1 minute with supervision    From Standing, Reach Forward with Outstretched Arm  Can reach forward >12 cm safely (5")    From Standing Position, Pick up Object from Floor  Able to pick up shoe, needs supervision    From Standing Position, Turn to Look Behind Over each Shoulder  Turn sideways only but maintains balance    Turn 360 Degrees  Able to turn 360 degrees safely but slowly    Standing Unsupported, Alternately Place Feet on Step/Stool  Needs assistance to keep from falling or unable to try    Standing Unsupported, One Foot in Front  Needs help to step but can hold 15 seconds    Standing on One Leg  Unable to try or needs assist to prevent fall    Total Score  34  Objective measurements completed on examination: See above findings.              PT Education - 04/07/19 1317    Education  Details  in sitting: heel raises, toe raises, marching, hip abduction    Person(s) Educated  Patient    Methods  Explanation;Demonstration;Handout    Comprehension  Verbalized understanding          PT Long Term Goals - 04/07/19 1300      PT LONG TERM GOAL #1   Title  Patient will be independent with HEP    Time  6    Period  Weeks    Status  New      PT LONG TERM GOAL #2   Title  Patient will demonstrate 4+/5 or greater bilateral LE MMT to improve stability during functional tasks.    Time  6    Period  Weeks    Status  New      PT LONG TERM GOAL #3   Title  Patient will decrease risk of falls as noted by the ability to perform modified 5x sit to stand test in 20 seconds or less.    Time  6    Period  Weeks    Status  New      PT LONG TERM GOAL #4   Title  Patient will improve Berg Balance Scale score to 38/56 or greater to decrease risk of falls.    Time  6    Period  Weeks    Status  New             Plan - 04/07/19 0957    Clinical Impression Statement  Patient is a 73 year old female who presents to physical therapy with bilateral LE weakness, decreased balance, and difficulty walking that has been progressing over the past couple of years. Patient's modified 5x sit to stand test of 27 seconds categorizes her as a fall risk with decreased functional strength. Patient's Berg Balance Scale Score of 30/56 categorizes her as a moderate fall risk. Patient ambulates with decreased gait speed, decreased bilateral step length, and decreased bilateral foot clearance. Patient and PT discussed plan of care on how to improve balance and HEP to which patient reported understanding. Patient would benefit from skilled physical therapy to address deficits and address patient's goals.    Personal Factors and Comorbidities  Age;Time since onset of injury/illness/exacerbation;Comorbidity 2    Comorbidities  HTN, chronic low back and bilateral knee pain, frequent falls     Examination-Activity Limitations  Locomotion Level;Transfers    Stability/Clinical Decision Making  Evolving/Moderate complexity    Clinical Decision Making  Moderate    Rehab Potential  Fair    PT Frequency  2x / week    PT Duration  6 weeks    PT Treatment/Interventions  ADLs/Self Care Home Management;Moist Heat;Gait training;Stair training;Functional mobility training;Therapeutic activities;Therapeutic exercise;Balance training;Patient/family education;Neuromuscular re-education;Manual techniques;Passive range of motion;Cryotherapy;Electrical Stimulation    PT Next Visit Plan  Nustep (if tolerable for left knee), balance activities in sitting and standing, LE strengthening    PT Home Exercise Plan  see patient education section    Consulted and Agree with Plan of Care  Patient       Patient will benefit from skilled therapeutic intervention in order to improve the following deficits and impairments:  Decreased balance, Decreased strength, Difficulty walking, Pain, Decreased activity tolerance, Decreased safety awareness  Visit Diagnosis: Chronic pain of right knee - Plan: PT  plan of care cert/re-cert  Stiffness of right knee, not elsewhere classified - Plan: PT plan of care cert/re-cert  Unsteadiness on feet Repeated falls Muscle weakness (generalized)   Gabriela Eves, PT, DPT   Problem List Patient Active Problem List   Diagnosis Date Noted  . Stasis dermatitis of both legs 12/24/2018  . Leg pain, bilateral 12/24/2018  . Degenerative disc disease, lumbar 12/24/2018  . Fibromyalgia 12/24/2018  . Vitamin D deficiency 12/24/2018  . Class 3 severe obesity with serious comorbidity and body mass index (BMI) of 40.0 to 44.9 in adult ([place]) 07/23/2017  . Closed fracture of right inferior pubic ramus ([place]) 11/18/2016  . Gastroesophageal reflux disease 06/06/2016  . Recurrent major depressive disorder, in full remission ([place]) 06/06/2016  . DM type 2 with diabetic dyslipidemia ([place])  03/06/2016  . Edema 09/14/2013  . Precordial pain 08/20/2011  . Essential hypertension, benign   . Hyperlipidemia     Gabriela Eves, PT, DPT 04/07/2019, 1:25 PM  [facility] [place], [place], [address] Phone: [phone]   Fax:  [phone]  Name: Tiffany Brown MRN: [id number] Date of Birth: 26-Nov-1945

## 2019-04-12 ENCOUNTER — Ambulatory Visit: Payer: Medicare Other | Admitting: Physical Therapy

## 2019-04-15 ENCOUNTER — Other Ambulatory Visit: Payer: Self-pay

## 2019-04-15 ENCOUNTER — Ambulatory Visit: Payer: Medicare Other | Admitting: Physical Therapy

## 2019-04-15 DIAGNOSIS — R2681 Unsteadiness on feet: Secondary | ICD-10-CM

## 2019-04-15 DIAGNOSIS — R296 Repeated falls: Secondary | ICD-10-CM

## 2019-04-15 DIAGNOSIS — M6281 Muscle weakness (generalized): Secondary | ICD-10-CM

## 2019-04-15 NOTE — Therapy (Signed)
[facility] [place], [place], [address] Phone: [phone]   Fax:  [phone]  Physical Therapy Treatment  Patient Details  Name: Tiffany Brown MRN: [id number] Date of Birth: 1945/07/10 Referring Provider (PT): Caryl Pina, MD   Encounter Date: 04/15/2019  PT End of Session - 04/15/19 0950    Visit Number  2    Number of Visits  12    Date for PT Re-Evaluation  07/06/19    Authorization Type  Progress note every 10th visit; KX modifier at 15th visit    PT Start Time  0945    PT Stop Time  1025    PT Time Calculation (min)  40 min    Activity Tolerance  Patient tolerated treatment well    Behavior During Therapy  [facility] for tasks assessed/performed       Past Medical History:  Diagnosis Date  . Abnormal glucose   . Depression with anxiety   . Essential hypertension, benign   . Fibromyalgia   . Melanoma ([place])   . Near syncope   . Other and unspecified hyperlipidemia     Past Surgical History:  Procedure Laterality Date  . ABDOMINAL HYSTERECTOMY    . BREAST SURGERY     LEFT  . CHOLECYSTECTOMY    . ELBOW SURGERY     X's 3 Left  . RIGHT DISTAL RADIUS FRACTURE    . TOTAL KNEE ARTHROPLASTY     RIGHT    There were no vitals filed for this visit.  Subjective Assessment - 04/15/19 0949    Subjective  COVID-19 screening performed upon arrival. Patient reports feeling alright. No falls recently.    Pertinent History  HTN, frequent falls, Chronic lower back and bilateral knee pain, Stasis dermatitis    Limitations  Walking;Standing;House hold activities    Patient Stated Goals  improve balance, feel more confident with walking         [facility] PT Assessment - 04/15/19 0001      Assessment   Medical Diagnosis  Recurrent falls, Weakness, Balance Disorder    Referring Provider (PT)  Caryl Pina, MD    Next MD Visit  February 2021    Prior Therapy  no      Precautions   Precautions  [facility] Adult PT Treatment/Exercise - 04/15/19 0001      Exercises   Exercises  Knee/Hip;Lumbar      Lumbar Exercises: Seated   Other Seated Lumbar Exercises  scapular retractions x20      Lumbar Exercises: Supine   Glut Set  20 reps;5 seconds    Bent Knee Raise  20 reps;2 seconds      Knee/Hip Exercises: Aerobic   Nustep  Level 1 x10 mins seat 10      Knee/Hip Exercises: Seated   Long Arc Quad  AROM;Both;20 reps    [employer]  x20 5" hold    Clamshell with TheraBand  Yellow   x20   Marching  AROM;Both;20 reps    Hamstring Curl  AROM;Both;20 reps    Hamstring Limitations  yellow theraband      Knee/Hip Exercises: Supine   Short Arc Quad Sets  AROM;Both;20 reps    Straight Leg Raises  AROM;Both;10 reps          Balance Exercises - 04/15/19 1021      Balance Exercises: Seated  Dynamic Sitting  Eyes opened;Lower extremity support - 2;Upper extremity support - 1;Anterior/posterior weight shift;Lateral weight shift;Reaching outside base of support   cones x10 each            PT Long Term Goals - 04/07/19 1300      PT LONG TERM GOAL #1   Title  Patient will be independent with HEP    Time  6    Period  Weeks    Status  New      PT LONG TERM GOAL #2   Title  Patient will demonstrate 4+/5 or greater bilateral LE MMT to improve stability during functional tasks.    Time  6    Period  Weeks    Status  New      PT LONG TERM GOAL #3   Title  Patient will decrease risk of falls as noted by the ability to perform modified 5x sit to stand test in 20 seconds or less.    Time  6    Period  Weeks    Status  New      PT LONG TERM GOAL #4   Title  Patient will improve Berg Balance Scale score to 38/56 or greater to decrease risk of falls.    Time  6    Period  Weeks    Status  New            Plan - 04/15/19 1010    Clinical Impression Statement  Patient responded well to therapy session with minimal complaints. Patient was  guided through TEs and was able to demonstrate good form with all. Due to increase of soreness and pain in feet due to her static dermatitis exercises were performed in sitting and supine. Nustep completed at end of session with low resistance with minimal complaints of left knee pain.    Personal Factors and Comorbidities  Age;Time since onset of injury/illness/exacerbation;Comorbidity 2    Comorbidities  HTN, chronic low back and bilateral knee pain, frequent falls    Examination-Activity Limitations  Locomotion Level;Transfers    Stability/Clinical Decision Making  Evolving/Moderate complexity    Clinical Decision Making  Moderate    Rehab Potential  Fair    PT Frequency  2x / week    PT Duration  6 weeks    PT Treatment/Interventions  ADLs/Self Care Home Management;Moist Heat;Gait training;Stair training;Functional mobility training;Therapeutic activities;Therapeutic exercise;Balance training;Patient/family education;Neuromuscular re-education;Manual techniques;Passive range of motion;Cryotherapy;Electrical Stimulation    PT Next Visit Plan  Nustep (if tolerable for left knee), balance activities in sitting and standing, LE strengthening    PT Home Exercise Plan  see patient education section    Consulted and Agree with Plan of Care  Patient       Patient will benefit from skilled therapeutic intervention in order to improve the following deficits and impairments:  Decreased balance, Decreased strength, Difficulty walking, Pain, Decreased activity tolerance, Decreased safety awareness  Visit Diagnosis: Unsteadiness on feet  Muscle weakness (generalized)  Repeated falls     Problem List Patient Active Problem List   Diagnosis Date Noted  . Stasis dermatitis of both legs 12/24/2018  . Leg pain, bilateral 12/24/2018  . Degenerative disc disease, lumbar 12/24/2018  . Fibromyalgia 12/24/2018  . Vitamin D deficiency 12/24/2018  . Class 3 severe obesity with serious comorbidity and  body mass index (BMI) of 40.0 to 44.9 in adult ([place]) 07/23/2017  . Closed fracture of right inferior pubic ramus ([place]) 11/18/2016  . Gastroesophageal reflux disease 06/06/2016  .  Recurrent major depressive disorder, in full remission ([place]) 06/06/2016  . DM type 2 with diabetic dyslipidemia ([place]) 03/06/2016  . Edema 09/14/2013  . Precordial pain 08/20/2011  . Essential hypertension, benign   . Hyperlipidemia     Gabriela Eves, PT, DPT 04/15/2019, 10:28 AM  [facility] [place], [place], [address] Phone: [phone]   Fax:  [phone]  Name: BALJINDER RADEN MRN: [id number] Date of Birth: 1945-06-19

## 2019-04-15 NOTE — Addendum Note (Signed)
Addended by: Gabriela Eves on: 04/15/2019 08:16 AM   Modules accepted: Orders

## 2019-04-19 ENCOUNTER — Ambulatory Visit: Payer: Medicare Other | Admitting: Physical Therapy

## 2019-04-19 ENCOUNTER — Other Ambulatory Visit: Payer: Self-pay

## 2019-04-19 ENCOUNTER — Encounter: Payer: Self-pay | Admitting: Physical Therapy

## 2019-04-19 DIAGNOSIS — M6281 Muscle weakness (generalized): Secondary | ICD-10-CM

## 2019-04-19 DIAGNOSIS — R2681 Unsteadiness on feet: Secondary | ICD-10-CM

## 2019-04-19 DIAGNOSIS — R296 Repeated falls: Secondary | ICD-10-CM

## 2019-04-19 NOTE — Therapy (Signed)
[facility] [place], [place], [address] Phone: [phone]   Fax:  [phone]  Physical Therapy Treatment  Patient Details  Name: Tiffany Brown MRN: [id number] Date of Birth: 1945-10-23 Referring Provider (PT): Caryl Pina, MD   Encounter Date: 04/19/2019  PT End of Session - 04/19/19 1026    Visit Number  3    Number of Visits  12    Date for PT Re-Evaluation  07/06/19    Authorization Type  Progress note every 10th visit; KX modifier at 15th visit    PT Start Time  0945    PT Stop Time  1028    PT Time Calculation (min)  43 min    Activity Tolerance  Patient tolerated treatment well    Behavior During Therapy  [facility] for tasks assessed/performed       Past Medical History:  Diagnosis Date  . Abnormal glucose   . Depression with anxiety   . Essential hypertension, benign   . Fibromyalgia   . Melanoma ([place])   . Near syncope   . Other and unspecified hyperlipidemia     Past Surgical History:  Procedure Laterality Date  . ABDOMINAL HYSTERECTOMY    . BREAST SURGERY     LEFT  . CHOLECYSTECTOMY    . ELBOW SURGERY     X's 3 Left  . RIGHT DISTAL RADIUS FRACTURE    . TOTAL KNEE ARTHROPLASTY     RIGHT    There were no vitals filed for this visit.  Subjective Assessment - 04/19/19 0956    Subjective  COVID-19 screening performed upon arrival. Patient reports feeling alright. No falls recently.    Pertinent History  HTN, frequent falls, Chronic lower back and bilateral knee pain, Stasis dermatitis    Limitations  Walking;Standing;House hold activities    Patient Stated Goals  improve balance, feel more confident with walking    Currently in Pain?  Yes    Pain Score  4     Pain Location  Back    Pain Orientation  Lower    Pain Descriptors / Indicators  Sore    Pain Type  Chronic pain    Pain Onset  More than a month ago    Pain Frequency  Constant    Aggravating Factors   standing    Pain Relieving  Factors  sitting                       [facility] Adult PT Treatment/Exercise - 04/19/19 0001      Lumbar Exercises: Seated   Other Seated Lumbar Exercises  scapular retractions 2x10 w yellow t-band      Lumbar Exercises: Supine   Ab Set  20 reps;3 seconds    Glut Set  20 reps;3 seconds    Clam  20 reps;3 seconds    Clam Limitations  core focus    Bent Knee Raise  3 seconds   2x10 with core focus   Straight Leg Raise  2 seconds   2x10 with core focus     Knee/Hip Exercises: Aerobic   Nustep  Level 1 x10 mins seat 10      Knee/Hip Exercises: Seated   Long Arc Quad  Strengthening;Both;2 sets;10 reps;Weights    Long Arc Quad Weight  2 lbs.    Marching  Strengthening;Both;2 sets;10 reps    Marching Limitations  2#          Balance Exercises - 04/19/19  1013      Balance Exercises: Standing   Standing Eyes Opened  Narrow base of support (BOS);Solid surface;30 secs;2 reps;Time    Standing Eyes Closed  Wide (BOA);Solid surface;1 rep;10 secs    Partial Tandem Stance  Eyes open;Upper extremity support 1;Intermittent upper extremity support;4 reps;10 secs    Heel Raises Limitations  x12    Sit to Stand Time  x10        PT Education - 04/19/19 1030    Education Details  posture awareness techniques, logroll, bending and movements to protecy back    Person(s) Educated  Patient    Methods  Explanation;Demonstration    Comprehension  Verbalized understanding;Returned demonstration          PT Long Term Goals - 04/19/19 1007      PT LONG TERM GOAL #1   Title  Patient will be independent with HEP    Time  6    Period  Weeks    Status  On-going      PT LONG TERM GOAL #2   Title  Patient will demonstrate 4+/5 or greater bilateral LE MMT to improve stability during functional tasks.    Time  6    Period  Weeks    Status  On-going      PT LONG TERM GOAL #3   Title  Patient will decrease risk of falls as noted by the ability to perform modified 5x sit to  stand test in 20 seconds or less.    Time  6    Period  Weeks    Status  On-going      PT LONG TERM GOAL #4   Title  Patient will improve Berg Balance Scale score to 38/56 or greater to decrease risk of falls.    Time  6    Period  Weeks    Status  On-going            Plan - 04/19/19 1026    Clinical Impression Statement  Patient tolerated treatment well today. Patient able to progress with gentle strengthening and activity tolerance. Patient able to start balance activcities with no LOB and SBA for safety. Patient has reported no falls and doing well for the exception of bumping into things at home. Patient current goals ongoing at this time. Educated patient on posture awareness techniques to prevent further pain or injury.    Personal Factors and Comorbidities  Age;Time since onset of injury/illness/exacerbation;Comorbidity 2    Comorbidities  HTN, chronic low back and bilateral knee pain, frequent falls    Examination-Activity Limitations  Locomotion Level;Transfers    Stability/Clinical Decision Making  Evolving/Moderate complexity    Rehab Potential  Fair    PT Frequency  2x / week    PT Duration  6 weeks    PT Treatment/Interventions  ADLs/Self Care Home Management;Moist Heat;Gait training;Stair training;Functional mobility training;Therapeutic activities;Therapeutic exercise;Balance training;Patient/family education;Neuromuscular re-education;Manual techniques;Passive range of motion;Cryotherapy;Electrical Stimulation    PT Next Visit Plan  cont with POC Nustep (if tolerable for left knee), balance activities in sitting and standing, LE strengthening    Consulted and Agree with Plan of Care  Patient       Patient will benefit from skilled therapeutic intervention in order to improve the following deficits and impairments:  Decreased balance, Decreased strength, Difficulty walking, Pain, Decreased activity tolerance, Decreased safety awareness  Visit Diagnosis: Muscle  weakness (generalized)  Unsteadiness on feet  Repeated falls     Problem List Patient Active  Problem List   Diagnosis Date Noted  . Stasis dermatitis of both legs 12/24/2018  . Leg pain, bilateral 12/24/2018  . Degenerative disc disease, lumbar 12/24/2018  . Fibromyalgia 12/24/2018  . Vitamin D deficiency 12/24/2018  . Class 3 severe obesity with serious comorbidity and body mass index (BMI) of 40.0 to 44.9 in adult ([place]) 07/23/2017  . Closed fracture of right inferior pubic ramus ([place]) 11/18/2016  . Gastroesophageal reflux disease 06/06/2016  . Recurrent major depressive disorder, in full remission ([place]) 06/06/2016  . DM type 2 with diabetic dyslipidemia ([place]) 03/06/2016  . Edema 09/14/2013  . Precordial pain 08/20/2011  . Essential hypertension, benign   . Hyperlipidemia     Ashanna Heinsohn P, PTA 04/19/2019, 10:32 AM  [facility] [place], [place], [address] Phone: [phone]   Fax:  [phone]  Name: GEORGEANNE FROME MRN: [id number] Date of Birth: Jun 26, 1945

## 2019-04-22 ENCOUNTER — Other Ambulatory Visit: Payer: Self-pay | Admitting: Family Medicine

## 2019-04-22 ENCOUNTER — Ambulatory Visit: Payer: Medicare Other | Admitting: Physical Therapy

## 2019-04-22 ENCOUNTER — Other Ambulatory Visit: Payer: Self-pay

## 2019-04-22 ENCOUNTER — Encounter: Payer: Self-pay | Admitting: Physical Therapy

## 2019-04-22 DIAGNOSIS — F3342 Major depressive disorder, recurrent, in full remission: Secondary | ICD-10-CM

## 2019-04-22 DIAGNOSIS — R296 Repeated falls: Secondary | ICD-10-CM

## 2019-04-22 DIAGNOSIS — M797 Fibromyalgia: Secondary | ICD-10-CM

## 2019-04-22 DIAGNOSIS — M6281 Muscle weakness (generalized): Secondary | ICD-10-CM

## 2019-04-22 DIAGNOSIS — R2681 Unsteadiness on feet: Secondary | ICD-10-CM | POA: Diagnosis not present

## 2019-04-22 NOTE — Therapy (Signed)
[facility] [place], [place], [address] Phone: [phone]   Fax:  [phone]  Physical Therapy Treatment  Patient Details  Name: Tiffany Brown MRN: [id number] Date of Birth: Aug 15, 1945 Referring Provider (PT): Caryl Pina, MD   Encounter Date: 04/22/2019  PT End of Session - 04/22/19 1151    Visit Number  4    Number of Visits  12    Date for PT Re-Evaluation  07/06/19    Authorization Type  Progress note every 10th visit; KX modifier at 15th visit    PT Start Time  0945    PT Stop Time  1020   Patient request to finish session early   PT Time Calculation (min)  35 min    Activity Tolerance  Patient limited by fatigue    Behavior During Therapy  [facility] for tasks assessed/performed       Past Medical History:  Diagnosis Date  . Abnormal glucose   . Depression with anxiety   . Essential hypertension, benign   . Fibromyalgia   . Melanoma ([place])   . Near syncope   . Other and unspecified hyperlipidemia     Past Surgical History:  Procedure Laterality Date  . ABDOMINAL HYSTERECTOMY    . BREAST SURGERY     LEFT  . CHOLECYSTECTOMY    . ELBOW SURGERY     X's 3 Left  . RIGHT DISTAL RADIUS FRACTURE    . TOTAL KNEE ARTHROPLASTY     RIGHT    There were no vitals filed for this visit.  Subjective Assessment - 04/22/19 1158    Subjective  COVID-19 screening performed upon arrival. Patient reports feeling alright but tired as she got very little sleep last night.    Pertinent History  HTN, frequent falls, Chronic lower back and bilateral knee pain, Stasis dermatitis    Limitations  Walking;Standing;House hold activities    Patient Stated Goals  improve balance, feel more confident with walking    Currently in Pain?  Yes   pain scale not provided        [facility] PT Assessment - 04/22/19 0001      Assessment   Medical Diagnosis  Recurrent falls, Weakness, Balance Disorder    Referring Provider (PT)   Caryl Pina, MD    Next MD Visit  February 2021    Prior Therapy  no      Precautions   Precautions  [facility] Adult PT Treatment/Exercise - 04/22/19 0001      Exercises   Exercises  Knee/Hip;Lumbar      Lumbar Exercises: Seated   Other Seated Lumbar Exercises  scapular retractions 3x10 w redt-band    Other Seated Lumbar Exercises  D2 flexion bilaterally 3x10      Knee/Hip Exercises: Aerobic   Nustep  Level 1 x12 mins seat 10      Knee/Hip Exercises: Seated   Long Arc Quad  Strengthening;Both;2 sets;10 reps;Weights    Long Arc Quad Weight  2 lbs.    Ball Squeeze  x30     Clamshell with TheraBand  Red   3x10   Marching  Strengthening;Both;10 reps;3 sets    Marching Limitations  red theraband      Knee/Hip Exercises: Supine   Bridges  AROM;Both;10 reps    Straight Leg Raises  AROM;Both;10 reps  PT Education - 04/22/19 1159    Education Details  heel raises in standing, resisted hip abduction, SLR    Person(s) Educated  Patient    Methods  Explanation;Demonstration;Handout    Comprehension  Verbalized understanding;Returned demonstration          PT Long Term Goals - 04/22/19 1018      PT LONG TERM GOAL #1   Title  Patient will be independent with HEP    Time  6    Period  Weeks    Status  On-going      PT LONG TERM GOAL #2   Title  Patient will demonstrate 4+/5 or greater bilateral LE MMT to improve stability during functional tasks.    Time  6    Status  On-going   NT     PT LONG TERM GOAL #3   Title  Patient will decrease risk of falls as noted by the ability to perform modified 5x sit to stand test in 20 seconds or less.    Time  6    Period  Weeks    Status  On-going   NT     PT LONG TERM GOAL #4   Title  Patient will improve Berg Balance Scale score to 38/56 or greater to decrease risk of falls.    Time  6    Period  Weeks    Status  On-going   NT           Plan - 04/22/19 1152     Clinical Impression Statement  Patient responded well to progression of repetitions but reported increasing fatigue during session. Patient demonstrated good form with all exercises. Patient requested to finish session early due to feeling tired from not getting enough sleep. Patient provided with new HEP to which she reported understanding.    Personal Factors and Comorbidities  Age;Time since onset of injury/illness/exacerbation;Comorbidity 2    Comorbidities  HTN, chronic low back and bilateral knee pain, frequent falls    Examination-Activity Limitations  Locomotion Level;Transfers    Stability/Clinical Decision Making  Evolving/Moderate complexity    Clinical Decision Making  Moderate    Rehab Potential  Fair    PT Frequency  2x / week    PT Duration  6 weeks    PT Treatment/Interventions  ADLs/Self Care Home Management;Moist Heat;Gait training;Stair training;Functional mobility training;Therapeutic activities;Therapeutic exercise;Balance training;Patient/family education;Neuromuscular re-education;Manual techniques;Passive range of motion;Cryotherapy;Electrical Stimulation    PT Next Visit Plan  cont with POC Nustep (if tolerable for left knee), balance activities in sitting and standing, LE strengthening    PT Home Exercise Plan  see patient education section    Consulted and Agree with Plan of Care  Patient       Patient will benefit from skilled therapeutic intervention in order to improve the following deficits and impairments:  Decreased balance, Decreased strength, Difficulty walking, Pain, Decreased activity tolerance, Decreased safety awareness  Visit Diagnosis: Muscle weakness (generalized)  Unsteadiness on feet  Repeated falls     Problem List Patient Active Problem List   Diagnosis Date Noted  . Stasis dermatitis of both legs 12/24/2018  . Leg pain, bilateral 12/24/2018  . Degenerative disc disease, lumbar 12/24/2018  . Fibromyalgia 12/24/2018  . Vitamin D  deficiency 12/24/2018  . Class 3 severe obesity with serious comorbidity and body mass index (BMI) of 40.0 to 44.9 in adult ([place]) 07/23/2017  . Closed fracture of right inferior pubic ramus ([place]) 11/18/2016  . Gastroesophageal reflux disease 06/06/2016  .  Recurrent major depressive disorder, in full remission ([place]) 06/06/2016  . DM type 2 with diabetic dyslipidemia ([place]) 03/06/2016  . Edema 09/14/2013  . Precordial pain 08/20/2011  . Essential hypertension, benign   . Hyperlipidemia     Gabriela Eves, PT, DPT 04/22/2019, 12:00 PM  [facility] [place], [place], [address] Phone: [phone]   Fax:  [phone]  Name: Tiffany Brown MRN: [id number] Date of Birth: [birth year]

## 2019-04-27 ENCOUNTER — Ambulatory Visit: Payer: Medicare Other | Admitting: Physical Therapy

## 2019-04-27 ENCOUNTER — Encounter: Payer: Self-pay | Admitting: Physical Therapy

## 2019-04-27 ENCOUNTER — Other Ambulatory Visit: Payer: Self-pay

## 2019-04-27 DIAGNOSIS — R296 Repeated falls: Secondary | ICD-10-CM

## 2019-04-27 DIAGNOSIS — M6281 Muscle weakness (generalized): Secondary | ICD-10-CM

## 2019-04-27 DIAGNOSIS — R2681 Unsteadiness on feet: Secondary | ICD-10-CM | POA: Diagnosis not present

## 2019-04-27 NOTE — Therapy (Signed)
[facility] [place], [place], [address] Phone: [phone]   Fax:  [phone]  Physical Therapy Treatment  Patient Details  Name: Tiffany Brown MRN: [id number] Date of Birth: 12-Nov-1945 Referring Provider (PT): Caryl Pina, MD   Encounter Date: 04/27/2019  PT End of Session - 04/27/19 1027    Visit Number  5    Number of Visits  12    Date for PT Re-Evaluation  07/06/19    Authorization Type  Progress note every 10th visit; KX modifier at 15th visit    PT Start Time  0947    PT Stop Time  1027    PT Time Calculation (min)  40 min    Equipment Utilized During Treatment  Other (comment)   Hurrycane   Activity Tolerance  Patient limited by fatigue    Behavior During Therapy  [facility] for tasks assessed/performed       Past Medical History:  Diagnosis Date  . Abnormal glucose   . Depression with anxiety   . Essential hypertension, benign   . Fibromyalgia   . Melanoma ([place])   . Near syncope   . Other and unspecified hyperlipidemia     Past Surgical History:  Procedure Laterality Date  . ABDOMINAL HYSTERECTOMY    . BREAST SURGERY     LEFT  . CHOLECYSTECTOMY    . ELBOW SURGERY     X's 3 Left  . RIGHT DISTAL RADIUS FRACTURE    . TOTAL KNEE ARTHROPLASTY     RIGHT    There were no vitals filed for this visit.  Subjective Assessment - 04/27/19 1037    Subjective  COVID 19 screening performed on patient upon arrival. Patient reports not sleeping well.    Pertinent History  HTN, frequent falls, Chronic lower back and bilateral knee pain, Stasis dermatitis    Limitations  Walking;Standing;House hold activities    Patient Stated Goals  improve balance, feel more confident with walking    Currently in Pain?  Other (Comment)   No pain score provided by patient        [facility] PT Assessment - 04/27/19 0001      Assessment   Medical Diagnosis  Recurrent falls, Weakness, Balance Disorder    Referring Provider  (PT)  Caryl Pina, MD    Next MD Visit  February 2021    Prior Therapy  no      Precautions   Precautions  Fall                   [facility] Adult PT Treatment/Exercise - 04/27/19 0001      Lumbar Exercises: Seated   Other Seated Lumbar Exercises  scapular retractions 3x10 w redt-band    Other Seated Lumbar Exercises  D2 flexion bilaterally 3x10      Knee/Hip Exercises: Aerobic   Nustep  L1 x10 min      Knee/Hip Exercises: Seated   Long Arc Quad  Strengthening;Both;2 sets;10 reps;Weights    Long Arc Quad Weight  2 lbs.    Ball Squeeze  x30     Clamshell with TheraBand  Red   x20 reps   Other Seated Knee/Hip Exercises  B heel/toe raise x30 reps each    Other Seated Knee/Hip Exercises  B ankle inversion/eversion x20 reps each    Marching  Strengthening;Both;2 sets;10 reps;Limitations    Marching Limitations  red theraband    Hamstring Curl  Strengthening;Both;2 sets;10 reps;Limitations    Hamstring Limitations  red theraband    Sit to Sand  20 reps;with UE support                  PT Long Term Goals - 04/22/19 1018      PT LONG TERM GOAL #1   Title  Patient will be independent with HEP    Time  6    Period  Weeks    Status  On-going      PT LONG TERM GOAL #2   Title  Patient will demonstrate 4+/5 or greater bilateral LE MMT to improve stability during functional tasks.    Time  6    Status  On-going   NT     PT LONG TERM GOAL #3   Title  Patient will decrease risk of falls as noted by the ability to perform modified 5x sit to stand test in 20 seconds or less.    Time  6    Period  Weeks    Status  On-going   NT     PT LONG TERM GOAL #4   Title  Patient will improve Berg Balance Scale score to 38/56 or greater to decrease risk of falls.    Time  6    Period  Weeks    Status  On-going   NT           Plan - 04/27/19 1033    Clinical Impression Statement  Patient presented in clinic with reports that she did not feel well as she  was unable to sleep last night. Patient able to complete all therex with short rest breaks between sets. Patient ended session early secondary to fatigue.    Personal Factors and Comorbidities  Age;Time since onset of injury/illness/exacerbation;Comorbidity 2    Comorbidities  HTN, chronic low back and bilateral knee pain, frequent falls    Examination-Activity Limitations  Locomotion Level;Transfers    Stability/Clinical Decision Making  Evolving/Moderate complexity    Rehab Potential  Fair    PT Frequency  2x / week    PT Duration  6 weeks    PT Treatment/Interventions  ADLs/Self Care Home Management;Moist Heat;Gait training;Stair training;Functional mobility training;Therapeutic activities;Therapeutic exercise;Balance training;Patient/family education;Neuromuscular re-education;Manual techniques;Passive range of motion;Cryotherapy;Electrical Stimulation    PT Next Visit Plan  cont with POC Nustep (if tolerable for left knee), balance activities in sitting and standing, LE strengthening    PT Home Exercise Plan  see patient education section    Consulted and Agree with Plan of Care  Patient       Patient will benefit from skilled therapeutic intervention in order to improve the following deficits and impairments:  Decreased balance, Decreased strength, Difficulty walking, Pain, Decreased activity tolerance, Decreased safety awareness  Visit Diagnosis: Muscle weakness (generalized)  Unsteadiness on feet  Repeated falls     Problem List Patient Active Problem List   Diagnosis Date Noted  . Stasis dermatitis of both legs 12/24/2018  . Leg pain, bilateral 12/24/2018  . Degenerative disc disease, lumbar 12/24/2018  . Fibromyalgia 12/24/2018  . Vitamin D deficiency 12/24/2018  . Class 3 severe obesity with serious comorbidity and body mass index (BMI) of 40.0 to 44.9 in adult ([place]) 07/23/2017  . Closed fracture of right inferior pubic ramus ([place]) 11/18/2016  . Gastroesophageal reflux  disease 06/06/2016  . Recurrent major depressive disorder, in full remission ([place]) 06/06/2016  . DM type 2 with diabetic dyslipidemia ([place]) 03/06/2016  . Edema 09/14/2013  . Precordial pain 08/20/2011  . Essential  hypertension, benign   . Hyperlipidemia     Standley Brooking, PTA 04/27/2019, 11:57 AM  [facility] [address] [place], [place], [address] Phone: [phone]   Fax:  [phone]  Name: Tiffany Brown MRN: [id number] Date of Birth: Sep 07, 1945

## 2019-04-29 ENCOUNTER — Ambulatory Visit: Payer: Medicare Other | Admitting: Physical Therapy

## 2019-05-03 ENCOUNTER — Telehealth: Payer: Self-pay | Admitting: Family Medicine

## 2019-05-03 NOTE — Telephone Encounter (Signed)
Pt states she had red spots on her ankles that are scabbed over on ankles that are burning, stinging and itching but were never blister like. Was told she has Stasis Dermatitis on both legs by dermatology. Does have some swelling in both lower extremities and hot to the touch, denies any weeping of fluids. Please advise?

## 2019-05-03 NOTE — Telephone Encounter (Signed)
Patient aware and verbalizes understanding. 

## 2019-05-03 NOTE — Telephone Encounter (Signed)
Please have her get checked out by urgent care since office is full today.  Likely needs abx but uncontrolled edema could also cause.

## 2019-05-04 ENCOUNTER — Ambulatory Visit: Payer: Medicare Other | Admitting: Physical Therapy

## 2019-05-06 ENCOUNTER — Encounter: Payer: Medicare Other | Admitting: Physical Therapy

## 2019-05-12 ENCOUNTER — Ambulatory Visit: Payer: Medicare Other | Admitting: Family Medicine

## 2019-05-17 DIAGNOSIS — B353 Tinea pedis: Secondary | ICD-10-CM | POA: Diagnosis not present

## 2019-05-17 DIAGNOSIS — D692 Other nonthrombocytopenic purpura: Secondary | ICD-10-CM | POA: Diagnosis not present

## 2019-05-17 DIAGNOSIS — I8311 Varicose veins of right lower extremity with inflammation: Secondary | ICD-10-CM | POA: Diagnosis not present

## 2019-05-17 DIAGNOSIS — I890 Lymphedema, not elsewhere classified: Secondary | ICD-10-CM | POA: Diagnosis not present

## 2019-05-20 ENCOUNTER — Ambulatory Visit
Admission: RE | Admit: 2019-05-20 | Discharge: 2019-05-20 | Disposition: A | Discharge status: Home / Self Care | Payer: Medicare Other | Source: Ambulatory Visit | Attending: Family Medicine | Admitting: Family Medicine

## 2019-05-20 ENCOUNTER — Other Ambulatory Visit: Payer: Self-pay

## 2019-05-20 DIAGNOSIS — Z1231 Encounter for screening mammogram for malignant neoplasm of breast: Secondary | ICD-10-CM

## 2019-05-27 DIAGNOSIS — M47817 Spondylosis without myelopathy or radiculopathy, lumbosacral region: Secondary | ICD-10-CM | POA: Diagnosis not present

## 2019-05-27 DIAGNOSIS — M5137 Other intervertebral disc degeneration, lumbosacral region: Secondary | ICD-10-CM | POA: Diagnosis not present

## 2019-05-27 DIAGNOSIS — G894 Chronic pain syndrome: Secondary | ICD-10-CM | POA: Diagnosis not present

## 2019-05-27 DIAGNOSIS — Z79891 Long term (current) use of opiate analgesic: Secondary | ICD-10-CM | POA: Diagnosis not present

## 2019-05-27 DIAGNOSIS — M1712 Unilateral primary osteoarthritis, left knee: Secondary | ICD-10-CM | POA: Diagnosis not present

## 2019-05-27 DIAGNOSIS — Z79899 Other long term (current) drug therapy: Secondary | ICD-10-CM | POA: Diagnosis not present

## 2019-05-28 ENCOUNTER — Ambulatory Visit (INDEPENDENT_AMBULATORY_CARE_PROVIDER_SITE_OTHER): Payer: Medicare Other | Admitting: Family Medicine

## 2019-05-28 ENCOUNTER — Encounter: Payer: Self-pay | Admitting: Family Medicine

## 2019-05-28 DIAGNOSIS — F3342 Major depressive disorder, recurrent, in full remission: Secondary | ICD-10-CM

## 2019-05-28 DIAGNOSIS — M797 Fibromyalgia: Secondary | ICD-10-CM

## 2019-05-28 DIAGNOSIS — I10 Essential (primary) hypertension: Secondary | ICD-10-CM | POA: Diagnosis not present

## 2019-05-28 MED ORDER — DULOXETINE HCL 60 MG PO CPEP: 120.0000 mg | ORAL_CAPSULE | Freq: Every day | ORAL | 1 refills | Status: DC

## 2019-05-28 NOTE — Progress Notes (Signed)
Virtual Visit via telephone Note  I connected with Tiffany Brown on 05/28/19 at 1312 by telephone and verified that I am speaking with the correct person using two identifiers. Tiffany Brown is currently located at home and no other people are currently with her during visit. The provider, Fransisca Kaufmann Vasti Yagi, MD is located in their office at time of visit.  Call ended at 1320  I discussed the limitations, risks, security and privacy concerns of performing an evaluation and management service by telephone and the availability of in person appointments. I also discussed with the patient that there may be a patient responsible charge related to this service. The patient expressed understanding and agreed to proceed.   History and Present Illness: Patient is calling in for a change in depression medication and 1 week ago she increased her duloxetine and feels better over the past week.  Depression is under better control and she is taking one in the am and one in the evening. She is also getting a lot of depression from her pain and current health issues.  Hypertension Patient is currently on amlodipine and losartan, and their blood pressure today is 130/80. Patient denies any lightheadedness or dizziness. Patient denies headaches, blurred vision, chest pains, shortness of breath, or weakness. Denies any side effects from medication and is content with current medication.   No diagnosis found.  Outpatient Encounter Medications as of 05/28/2019  Medication Sig  . amLODipine (NORVASC) 5 MG tablet Take 1 tablet (5 mg total) by mouth daily.  . DULoxetine (CYMBALTA) 60 MG capsule TAKE 1 CAPSULE BY MOUTH EVERY DAY  . fluticasone (FLONASE) 50 MCG/ACT nasal spray Place 2 sprays into the nose daily as needed (allergies).   . furosemide (LASIX) 40 MG tablet Take 1 tablet (40 mg total) by mouth 2 (two) times daily as needed.  Marland Kitchen HYDROcodone-acetaminophen (NORCO) 10-325 MG per tablet Take 1 tablet by  mouth every 6 (six) hours as needed for moderate pain.   Marland Kitchen losartan (COZAAR) 100 MG tablet Take 1 tablet (100 mg total) by mouth daily.  . meloxicam (MOBIC) 7.5 MG tablet Take 1 tablet (7.5 mg total) by mouth daily.  . pravastatin (PRAVACHOL) 80 MG tablet Take 1 tablet (80 mg total) by mouth daily.  . Vitamin D, Ergocalciferol, (DRISDOL) 1.25 MG (50000 UT) CAPS capsule Take 1 capsule (50,000 Units total) by mouth every 7 (seven) days.   No facility-administered encounter medications on file as of 05/28/2019.    Review of Systems  Constitutional: Negative for chills and fever.  Eyes: Negative for visual disturbance.  Respiratory: Negative for chest tightness and shortness of breath.   Cardiovascular: Negative for chest pain and leg swelling.  Musculoskeletal: Positive for back pain. Negative for gait problem.  Skin: Negative for rash.  Neurological: Negative for light-headedness and headaches.  Psychiatric/Behavioral: Positive for dysphoric mood. Negative for agitation, behavioral problems, self-injury, sleep disturbance and suicidal ideas. The patient is nervous/anxious.   All other systems reviewed and are negative.   Observations/Objective: Patient sounds comfortable and in no acute distress.  Assessment and Plan: Problem List Items Addressed This Visit      Cardiovascular and Mediastinum   Essential hypertension, benign     Other   Fibromyalgia   Relevant Medications   DULoxetine (CYMBALTA) 60 MG capsule   Recurrent major depressive disorder, in full remission ([place]) - Primary   Relevant Medications   DULoxetine (CYMBALTA) 60 MG capsule      Patient self increased  her Cymbalta about a week ago to twice a day and she feels like is doing a lot better so we will keep it going at that dose and she takes 60 in the morning and 60 in the evening.  She says her blood pressures been doing a lot better and she is been keeping a check on it she reviewed are exaggerated due to side  effects from either of those medications. Follow up plan: Return in about 4 weeks (around 06/25/2019) for htn and depression.     I discussed the assessment and treatment plan with the patient. The patient was provided an opportunity to ask questions and all were answered. The patient agreed with the plan and demonstrated an understanding of the instructions.   The patient was advised to call back or seek an in-person evaluation if the symptoms worsen or if the condition fails to improve as anticipated.  The above assessment and management plan was discussed with the patient. The patient verbalized understanding of and has agreed to the management plan. Patient is aware to call the clinic if symptoms persist or worsen. Patient is aware when to return to the clinic for a follow-up visit. Patient educated on when it is appropriate to go to the emergency department.    I provided 8 minutes of non-face-to-face time during this encounter.    Worthy Rancher, MD

## 2019-05-31 ENCOUNTER — Encounter: Payer: Self-pay | Admitting: Family Medicine

## 2019-05-31 DIAGNOSIS — K5900 Constipation, unspecified: Secondary | ICD-10-CM | POA: Diagnosis not present

## 2019-05-31 DIAGNOSIS — R194 Change in bowel habit: Secondary | ICD-10-CM | POA: Diagnosis not present

## 2019-05-31 DIAGNOSIS — R1030 Lower abdominal pain, unspecified: Secondary | ICD-10-CM | POA: Diagnosis not present

## 2019-05-31 DIAGNOSIS — R1013 Epigastric pain: Secondary | ICD-10-CM | POA: Diagnosis not present

## 2019-05-31 DIAGNOSIS — R110 Nausea: Secondary | ICD-10-CM | POA: Diagnosis not present

## 2019-06-05 ENCOUNTER — Other Ambulatory Visit: Payer: Self-pay | Admitting: Physician Assistant

## 2019-06-05 DIAGNOSIS — R1030 Lower abdominal pain, unspecified: Secondary | ICD-10-CM

## 2019-06-07 ENCOUNTER — Encounter (HOSPITAL_COMMUNITY): Payer: Self-pay | Admitting: Physical Therapy

## 2019-06-07 ENCOUNTER — Other Ambulatory Visit: Payer: Self-pay

## 2019-06-07 ENCOUNTER — Ambulatory Visit (HOSPITAL_COMMUNITY): Payer: Medicare Other | Attending: Specialist | Admitting: Physical Therapy

## 2019-06-07 DIAGNOSIS — S81801A Unspecified open wound, right lower leg, initial encounter: Secondary | ICD-10-CM | POA: Diagnosis not present

## 2019-06-07 DIAGNOSIS — I890 Lymphedema, not elsewhere classified: Secondary | ICD-10-CM | POA: Diagnosis not present

## 2019-06-07 DIAGNOSIS — M6281 Muscle weakness (generalized): Secondary | ICD-10-CM | POA: Diagnosis not present

## 2019-06-07 NOTE — Therapy (Signed)
[facility] [address] [place], [place], [address] Phone: [phone]   Fax:  [phone]  Physical Therapy Evaluation  Patient Details  Name: Tiffany Brown MRN: [id number] Date of Birth: 1945/09/22 Referring Provider (PT): Carloyn Jaeger   Encounter Date: 06/07/2019  PT End of Session - 06/07/19 1216    Visit Number  1    Number of Visits  18    Date for PT Re-Evaluation  07/19/19    Authorization Type  [employer] M[employer]    Authorization - Visit Number  1    Authorization - Number of Visits  10    PT Start Time  [id number]    PT Stop Time  1205    PT Time Calculation (min)  80 min    Activity Tolerance  Patient tolerated treatment well    Behavior During Therapy  [facility] for tasks assessed/performed       Past Medical History:  Diagnosis Date  . Abnormal glucose   . Depression with anxiety   . Essential hypertension, benign   . Fibromyalgia   . Melanoma ([place])   . Near syncope   . Other and unspecified hyperlipidemia     Past Surgical History:  Procedure Laterality Date  . ABDOMINAL HYSTERECTOMY    . BREAST SURGERY     LEFT  . CHOLECYSTECTOMY    . ELBOW SURGERY     X's 3 Left  . RIGHT DISTAL RADIUS FRACTURE    . TOTAL KNEE ARTHROPLASTY     RIGHT    There were no vitals filed for this visit.   Subjective Assessment - 06/07/19 1047    Subjective  [name] states that her legs her legs have been swelling for several years.  She states that she has been going to a dermatalogist for 20 years for stasis dermitits but it keeps getting worse.  Sometimes she gets blisters on her legs.  She is on two diuretics at this time.    Pertinent History  HTN, frequent falls, Chronic lower back, ( has been having injections for years),  and bilateral knee pain, Stasis dermatitis    Limitations  Walking;Standing;House hold activities    How long can you sit comfortably?  no problem    How long can you stand comfortably?  10 -15 minutes    How  long can you walk comfortably?  walks with a rollator 10-15  minutes    Patient Stated Goals  improve balance, feel more confident with walking    Currently in Pain?  Yes    Pain Score  4     Pain Location  Leg    Pain Orientation  Left;Right    Pain Descriptors / Indicators  Aching;Tightness;Sore    Pain Type  Chronic pain    Pain Onset  More than a month ago    Pain Frequency  Constant    Aggravating Factors   nothing    Pain Relieving Factors  elevate her legs    Effect of Pain on Daily Activities  limits      life impact 54   [facility] PT Assessment - 06/07/19 0001      Assessment   Medical Diagnosis  B Lymphedema    Referring Provider (PT)  Carloyn Jaeger    Next MD Visit  February 2021    Prior Therapy  no      Precautions   Precautions  --   cellulitis     Balance Screen  Has the patient fallen in the past 6 months  Yes    How many times?  2    Has the patient had a decrease in activity level because of a fear of falling?   Yes    Is the patient reluctant to leave their home because of a fear of falling?   No      Prior Function   Level of Independence  Independent      Cognition   Overall Cognitive Status  Within Functional Limits for tasks assessed      Observation/Other Assessments   Observations  Erythema B LE from 4" proximal to knee to toes Circumfertially     Skin Integrity  wound on Lt anterior LE 1x.6 unknown depth as 100% slough weeping serous fluid.          LYMPHEDEMA/ONCOLOGY QUESTIONNAIRE - 06/07/19 1120      What other symptoms do you have   Are you Having Heaviness or Tightness  Yes    Are you having Pain  Yes    Are you having pitting edema  Yes    Body Site  LE    Is it Hard or Difficult finding clothes that fit  Yes    Do you have infections  No    Stemmer Sign  Yes      Lymphedema Stage   Stage  STAGE 2 SPONTANEOUSLY IRREVERSIBLE      Lymphedema Assessments   Lymphedema Assessments  Lower extremities      Right Lower Extremity  Lymphedema   30 cm Proximal to Suprapatella  --    20 cm Proximal to Suprapatella  74.5 cm    10 cm Proximal to Suprapatella  63.9 cm    At Midpatella/Popliteal Crease  50 cm    30 cm Proximal to Floor at Lateral Plantar Foot  44 cm    20 cm Proximal to Floor at Lateral Plantar Foot  34.2 1    10  cm Proximal to Floor at Lateral Malleoli  22.4 cm    Circumference of ankle/heel  34.7 cm.    5 cm Proximal to 1st MTP Joint  24.6 cm    Across MTP Joint  24.9 cm    Around Proximal Great Toe  9 cm      Left Lower Extremity Lymphedema   20 cm Proximal to Suprapatella  72 cm    10 cm Proximal to Suprapatella  59.5 cm    At Midpatella/Popliteal Crease  49.5 cm    30 cm Proximal to Floor at Lateral Plantar Foot  41.3 cm    20 cm Proximal to Floor at Lateral Plantar Foot  30.8 cm    10 cm Proximal to Floor at Lateral Malleoli  24 cm    Circumference of ankle/heel  34.5 cm.    5 cm Proximal to 1st MTP Joint  24.5 cm    Across MTP Joint  23.5 cm    Around Proximal Great Toe  9.5 cm             Objective measurements completed on examination: See above findings.      [facility] Adult PT Treatment/Exercise - 06/07/19 0001      Lumbar Exercises: Seated   Other Seated Lumbar Exercises  BLE ankle pump, LAQ,hipab/adduction, marching and diaphragmic breathing all x 10      Manual Therapy   Manual Therapy  Manual Lymphatic Drainage (MLD);Compression Bandaging    Manual therapy comments  completed seperate from all  other aspects of treatment.     Manual Lymphatic Drainage (MLD)  LE cleansed and moisturized B with manual techniques both ant and posterior foot to knee while applying lotion.              PT Education - 06/07/19 1214    Education Details  What Lymphedema is; that your manage lymphedema you do not cure lymphedema, How treatments will progress; LE exercises, use of a [employer]) Educated  Patient    Methods  Explanation;Handout    Comprehension  Verbalized  understanding       PT Short Term Goals - 06/07/19 1228      PT SHORT TERM GOAL #1   Title  Pt wound on anterior aspect of Rt LE to be healed    Time  3    Period  Weeks    Status  New    Target Date  06/28/19      PT SHORT TERM GOAL #2   Title  Pt volume in thigh to have decreased 2-3 cm; LE 1--2    Time  3    Period  Weeks    Status  New      PT SHORT TERM GOAL #3   Title  PT to be completing LE exercises at least daily to increase lymphatic circulation.    Time  3    Period  Weeks    Status  New        PT Long Term Goals - 06/07/19 1231      PT LONG TERM GOAL #1   Title  PT to have lost 4-6  cm of volume from her thighs  ;2-4 from her LE    Time  6    Period  Weeks    Status  New    Target Date  07/19/19      PT LONG TERM GOAL #2   Title  Pt to no longer have erythema in her LE    Time  6    Period  Weeks    Status  New      PT LONG TERM GOAL #3   Title  PT to have new compression garments that provide 20-3-mmHG and to be wearing on a daily basis    Time  6    Period  Weeks    Status  New      PT LONG TERM GOAL #4   Title  PT to have obtained the biocompression sleeve for her pump , ( pt is going to try and see who she recieved the pump from).             Plan - 06/07/19 1220    Clinical Impression Statement  Ms. Derrington is a 74 yo female who has been having wounds, blisters and swelling of both legs for over ten years.   She has been given 15-20 mmHG compression garments and has a pump, however, she does not use the pump on a regular basis.  She has B Thigh high sleeves for the pump but may benefit from a biocompression as her abdominal area appears indurated as well.  Ms. Hurtig has been referred to skilled therapy for lymphedema treatment.  Evalution demonstrates significant induration with multiple papillomas throughout her LE, significant erythema encompassing all of her LE and 4" of distal thigh area.  There is an open wound on the pt RT anterior  LE that is weeping.  Ms Pehl will benefit from  skilled PT to educate her on lymphedema and the importance of skin care as well as wound care and lymphedema treatment to decrease her induration and volume load.    Personal Factors and Comorbidities  Fitness;Past/Current Experience;Time since onset of injury/illness/exacerbation    Comorbidities  HTN, frequent falls, Chronic lower back, ( has been having injections for years), and bilateral knee pain, Stasis dermatitis    Examination-Activity Limitations  Bathing;Caring for Others;Dressing;Hygiene/Grooming;Locomotion Level;Squat;Stairs;Stand    Examination-Participation Restrictions  Cleaning;Community Activity;Yard Work    [employer] officer  Evolving/Moderate complexity    Clinical Decision Making  Moderate    Rehab Potential  Good    PT Frequency  3x / week    PT Duration  6 weeks    PT Treatment/Interventions  ADLs/Self Care Home Management;Manual techniques;Manual lymph drainage;Compression bandaging;Patient/family education;Therapeutic exercise;Other (comment)   wound care to include debridement   PT Next Visit Plan  See if pt has decided if she wants garments vs. juxtafit and send MD an order for the garment.  See if pt found out who she got her pump for and contact re what needs to be done to aquire a biogarment. Begin manual techniqes to decrease induration, cut foam for LE to include thighs.  If bandages are available begin compression bandaging to include toes.  Wound care if wound order has returned.    PT Home Exercise Plan  LE exercises and diaphragmic breathing       Patient will benefit from skilled therapeutic intervention in order to improve the following deficits and impairments:  Abnormal gait, Decreased activity tolerance, Decreased range of motion, Difficulty walking, Increased edema, Pain, Decreased skin integrity  Visit Diagnosis: Lymphedema, not elsewhere classified  Leg wound, right, initial  encounter     Problem List Patient Active Problem List   Diagnosis Date Noted  . Stasis dermatitis of both legs 12/24/2018  . Leg pain, bilateral 12/24/2018  . Degenerative disc disease, lumbar 12/24/2018  . Fibromyalgia 12/24/2018  . Vitamin D deficiency 12/24/2018  . Class 3 severe obesity with serious comorbidity and body mass index (BMI) of 40.0 to 44.9 in adult ([place]) 07/23/2017  . Closed fracture of right inferior pubic ramus ([place]) 11/18/2016  . Gastroesophageal reflux disease 06/06/2016  . Recurrent major depressive disorder, in full remission ([place]) 06/06/2016  . DM type 2 with diabetic dyslipidemia ([place]) 03/06/2016  . Edema 09/14/2013  . Precordial pain 08/20/2011  . Essential hypertension, benign   . Hyperlipidemia     Rayetta Humphrey, [place] CLT [phone] 06/07/2019, 12:37 PM  [facility] [address] [place], [place], [address] Phone: [phone]   Fax:  [phone]  Name: Tiffany Brown MRN: [id number] Date of Birth: 10-14-1945

## 2019-06-09 ENCOUNTER — Telehealth (HOSPITAL_COMMUNITY): Payer: Self-pay | Admitting: Physical Therapy

## 2019-06-09 ENCOUNTER — Other Ambulatory Visit: Payer: Self-pay

## 2019-06-09 ENCOUNTER — Ambulatory Visit (HOSPITAL_COMMUNITY): Payer: Medicare Other | Admitting: Physical Therapy

## 2019-06-09 ENCOUNTER — Ambulatory Visit (HOSPITAL_COMMUNITY): Payer: Medicare Other

## 2019-06-09 DIAGNOSIS — S81801A Unspecified open wound, right lower leg, initial encounter: Secondary | ICD-10-CM | POA: Diagnosis not present

## 2019-06-09 DIAGNOSIS — I890 Lymphedema, not elsewhere classified: Secondary | ICD-10-CM

## 2019-06-09 DIAGNOSIS — M6281 Muscle weakness (generalized): Secondary | ICD-10-CM | POA: Diagnosis not present

## 2019-06-09 NOTE — Therapy (Signed)
[facility] [address] [place], [place], [address] Phone: [phone]   Fax:  [phone]  Physical Therapy Treatment  Patient Details  Name: Tiffany Brown MRN: [id number] Date of Birth: May 13, 1945 Referring Provider (PT): Carloyn Jaeger   Encounter Date: 06/09/2019  PT End of Session - 06/09/19 1123    Visit Number  2    Number of Visits  18    Date for PT Re-Evaluation  07/19/19    Authorization Type  [employer] MEdic[employer]    Authorization - Visit Number  2    Authorization - Number of Visits  10    PT Start Time  0920    PT Stop Time  1040    PT Time Calculation (min)  80 min    Activity Tolerance  Patient tolerated treatment well    Behavior During Therapy  [facility] for tasks assessed/performed       Past Medical History:  Diagnosis Date  . Abnormal glucose   . Depression with anxiety   . Essential hypertension, benign   . Fibromyalgia   . Melanoma ([place])   . Near syncope   . Other and unspecified hyperlipidemia     Past Surgical History:  Procedure Laterality Date  . ABDOMINAL HYSTERECTOMY    . BREAST SURGERY     LEFT  . CHOLECYSTECTOMY    . ELBOW SURGERY     X's 3 Left  . RIGHT DISTAL RADIUS FRACTURE    . TOTAL KNEE ARTHROPLASTY     RIGHT    There were no vitals filed for this visit.  Subjective Assessment - 06/09/19 1115    Subjective  pt returns today stating she's been doing the exercises.  Also returns with information evaluating therapist requested on pump to order full LE attachements.  Pt without any other issues today    Patient is accompained by:  Family member   spouse   Currently in Pain?  No/denies                       [facility] Adult PT Treatment/Exercise - 06/09/19 0001      Manual Therapy   Manual Therapy  Manual Lymphatic Drainage (MLD);Compression Bandaging    Manual therapy comments  completed seperate from all other aspects of treatment.     Manual Lymphatic Drainage (MLD)  for  bilateral LE's including short neck, deep and superficial abdominal and lumbar nodes, inguinal-axillary anastomosis bilaterally both supine and prone    Compression Bandaging  foam cut for bilateral LE's full leg to thigh and placed in bag in lymph room cabinet             PT Education - 06/09/19 1121    Education Details  Discussion on types of garments and differencing in doffing/donning (decided on regular stockings either thigh high or capri with knee high) .  Reviewed goals, HEP and POC moving forward.  Explained use of foam with bandaging and explained MLD as completing.    Person(s) Educated  Patient    Methods  Explanation;Demonstration    Comprehension  Verbalized understanding       PT Short Term Goals - 06/07/19 1228      PT SHORT TERM GOAL #1   Title  Pt wound on anterior aspect of Rt LE to be healed    Time  3    Period  Weeks    Status  New    Target Date  06/28/19  PT SHORT TERM GOAL #2   Title  Pt volume in thigh to have decreased 2-3 cm; LE 1--2    Time  3    Period  Weeks    Status  New      PT SHORT TERM GOAL #3   Title  PT to be completing LE exercises at least daily to increase lymphatic circulation.    Time  3    Period  Weeks    Status  New        PT Long Term Goals - 06/07/19 1231      PT LONG TERM GOAL #1   Title  PT to have lost 4-6  cm of volume from her thighs  ;2-4 from her LE    Time  6    Period  Weeks    Status  New    Target Date  07/19/19      PT LONG TERM GOAL #2   Title  Pt to no longer have erythema in her LE    Time  6    Period  Weeks    Status  New      PT LONG TERM GOAL #3   Title  PT to have new compression garments that provide 20-3-mmHG and to be wearing on a daily basis    Time  6    Period  Weeks    Status  New      PT LONG TERM GOAL #4   Title  PT to have obtained the biocompression sleeve for her pump , ( pt is going to try and see who she recieved the pump from).            Plan - 06/09/19  1124    Clinical Impression Statement  Pt returns today with pump info that was given to evaluating therapist.  Discussed goals, treatment plan and garments.  Pt has decided on regular stockings, either thigh high or capri with knee highs.  Order faxed to MD. No weeping or opening noted this session to Rt anterior LE.   continued with manual lymph drainage both anterior and posteriorly.  Foam was cut for full LE so we can begin compressoin bandaging when bandaging arrives.  Pt without any further questions this session.    Personal Factors and Comorbidities  Fitness;Past/Current Experience;Time since onset of injury/illness/exacerbation    Comorbidities  HTN, frequent falls, Chronic lower back, ( has been having injections for years), and bilateral knee pain, Stasis dermatitis    Examination-Activity Limitations  Bathing;Caring for Others;Dressing;Hygiene/Grooming;Locomotion Level;Squat;Stairs;Stand    Examination-Participation Restrictions  Cleaning;Community Activity;Yard Work    [employer]chant [employer] officer  Evolving/Moderate complexity    Rehab Potential  Good    PT Frequency  3x / week    PT Duration  6 weeks    PT Treatment/Interventions  ADLs/Self Care Home Management;Manual techniques;Manual lymph drainage;Compression bandaging;Patient/family education;Therapeutic exercise;Other (comment)   wound care to include debridement   PT Next Visit Plan  Continue with manual techniqes to decrease induration. Begin compression bandaging to include toes when available.   Follow up on signed compression hose order and fax to [facility] when ready.   Wound care if wound order has returned and needed.    PT Home Exercise Plan  LE exercises and diaphragmic breathing       Patient will benefit from skilled therapeutic intervention in order to improve the following deficits and impairments:  Abnormal gait, Decreased activity tolerance, Decreased range of motion, Difficulty walking, Increased  edema, Pain,  Decreased skin integrity  Visit Diagnosis: Lymphedema, not elsewhere classified     Problem List Patient Active Problem List   Diagnosis Date Noted  . Stasis dermatitis of both legs 12/24/2018  . Leg pain, bilateral 12/24/2018  . Degenerative disc disease, lumbar 12/24/2018  . Fibromyalgia 12/24/2018  . Vitamin D deficiency 12/24/2018  . Class 3 severe obesity with serious comorbidity and body mass index (BMI) of 40.0 to 44.9 in adult ([place]) 07/23/2017  . Closed fracture of right inferior pubic ramus ([place]) 11/18/2016  . Gastroesophageal reflux disease 06/06/2016  . Recurrent major depressive disorder, in full remission ([place]) 06/06/2016  . DM type 2 with diabetic dyslipidemia ([place]) 03/06/2016  . Edema 09/14/2013  . Precordial pain 08/20/2011  . Essential hypertension, benign   . Hyperlipidemia    Teena Irani, PTA/CLT [phone]  Teena Irani 06/09/2019, 11:29 AM  [facility] [address] [place], [place], [address] Phone: [phone]   Fax:  [phone]  Name: Tiffany Brown MRN: [id number] Date of Birth: Mar 19, 1946

## 2019-06-09 NOTE — Telephone Encounter (Signed)
Pt left information that her pump came from biocompression.  Therapist called company who stated that they do not sell directly to the public and would need a serial number to be able to tell what DME company supplied the pt with the pump.  A message was left on pt's phone requesting that she bring this information with her to the next visit.  An order for a bioabdominal sleeve was sent to patient's MD>   Rayetta Humphrey, [place] CLT [phone]

## 2019-06-09 NOTE — Telephone Encounter (Signed)
pt cancelled due to she has to take her husband to the heart doctor

## 2019-06-10 ENCOUNTER — Ambulatory Visit (HOSPITAL_COMMUNITY): Payer: Medicare Other | Admitting: Physical Therapy

## 2019-06-10 ENCOUNTER — Other Ambulatory Visit: Payer: Self-pay

## 2019-06-10 ENCOUNTER — Encounter (HOSPITAL_COMMUNITY): Payer: Self-pay | Admitting: Physical Therapy

## 2019-06-10 DIAGNOSIS — I890 Lymphedema, not elsewhere classified: Secondary | ICD-10-CM | POA: Diagnosis not present

## 2019-06-10 DIAGNOSIS — S81801A Unspecified open wound, right lower leg, initial encounter: Secondary | ICD-10-CM | POA: Diagnosis not present

## 2019-06-10 DIAGNOSIS — M6281 Muscle weakness (generalized): Secondary | ICD-10-CM | POA: Diagnosis not present

## 2019-06-10 NOTE — Therapy (Signed)
[place], [place], [address] Phone: [phone]   Fax:  [phone]  Physical Therapy Treatment  Patient Details  Name: SALIMAH OTTUM MRN: [id number] Date of Birth: 04/19/1946 Referring Provider (PT): Carloyn Jaeger   Encounter Date: 06/10/2019  PT End of Session - 06/10/19 1051    Visit Number  3    Number of Visits  18    Date for PT Re-Evaluation  07/19/19    Authorization Type  [employer] M[employer]    Authorization - Visit Number  3    Authorization - Number of Visits  10    PT Start Time  0915    PT Stop Time  1033    PT Time Calculation (min)  78 min    Activity Tolerance  Patient tolerated treatment well    Behavior During Therapy  [facility] for tasks assessed/performed       Past Medical History:  Diagnosis Date  . Abnormal glucose   . Depression with anxiety   . Essential hypertension, benign   . Fibromyalgia   . Melanoma ([place])   . Near syncope   . Other and unspecified hyperlipidemia     Past Surgical History:  Procedure Laterality Date  . ABDOMINAL HYSTERECTOMY    . BREAST SURGERY     LEFT  . CHOLECYSTECTOMY    . ELBOW SURGERY     X's 3 Left  . RIGHT DISTAL RADIUS FRACTURE    . TOTAL KNEE ARTHROPLASTY     RIGHT    There were no vitals filed for this visit.  Subjective Assessment - 06/10/19 1046    Subjective  Pt states that she can already tell that her legs are not as tight.  She is completing her exercises on a daily basis.    Pertinent History  HTN, frequent falls, Chronic lower back, ( has been having injections for years),  and bilateral knee pain, Stasis dermatitis    Limitations  Walking;Standing;House hold activities    How long can you sit comfortably?  no problem    How long can you stand comfortably?  10 -15 minutes    How long can you walk comfortably?  walks with a rollator 10-15  minutes    Patient Stated Goals  improve balance, feel more confident with walking    Currently in  Pain?  Yes    Pain Score  3     Pain Location  Leg    Pain Orientation  Left;Right    Pain Descriptors / Indicators  Tightness    Pain Type  Chronic pain    Pain Onset  More than a month ago    Pain Frequency  Intermittent    Aggravating Factors   dependent posititon    Effect of Pain on Daily Activities  limitis                       [facility] Adult PT Treatment/Exercise - 06/10/19 0001      Manual Therapy   Manual Therapy  Manual Lymphatic Drainage (MLD);Compression Bandaging    Manual therapy comments  completed seperate from all other aspects of treatment.     Manual Lymphatic Drainage (MLD)  for bilateral LE's including short neck, deep and superficial abdominal and lumbar nodes, inguinal-axillary anastomosis bilaterally both supine and prone               PT Short Term Goals - 06/10/19 1055  PT SHORT TERM GOAL #1   Title  Pt wound on anterior aspect of Rt LE to be healed    Time  3    Period  Weeks    Status  Achieved    Target Date  06/28/19      PT SHORT TERM GOAL #2   Title  Pt volume in thigh to have decreased 2-3 cm; LE 1--2    Time  3    Period  Weeks    Status  On-going      PT SHORT TERM GOAL #3   Title  PT to be completing LE exercises at least daily to increase lymphatic circulation.    Time  3    Period  Weeks    Status  Achieved        PT Long Term Goals - 06/10/19 1056      PT LONG TERM GOAL #1   Title  PT to have lost 4-6  cm of volume from her thighs  ;2-4 from her LE    Time  6    Period  Weeks    Status  On-going      PT LONG TERM GOAL #2   Title  Pt to no longer have erythema in her LE    Time  6    Period  Weeks    Status  On-going      PT LONG TERM GOAL #3   Title  PT to have new compression garments that provide 20-3-mmHG and to be wearing on a daily basis    Time  6    Period  Weeks    Status  On-going      PT LONG TERM GOAL #4   Title  PT to have obtained the biocompression sleeve for her pump , (  pt is going to try and see who she recieved the pump from).    Status  On-going            Plan - 06/10/19 1052    Clinical Impression Statement  Pt did not recieve message that we would need the serial number from her pump to find out what DME it is from to inquire about an abdominal compression garment.  Pt will bring this next session.  Pt LE continure to have increased erythema from 4" superior to knee jt to toes.  PT skin was moisturized multiple times throughout treatment due to severely cracking dry skin.  Pt LEcontinues to be indurated but noted improvement following manual.  Bandages have been ordered but have not arrived .    Personal Factors and Comorbidities  Fitness;Past/Current Experience;Time since onset of injury/illness/exacerbation    Comorbidities  HTN, frequent falls, Chronic lower back, ( has been having injections for years), and bilateral knee pain, Stasis dermatitis    Examination-Activity Limitations  Bathing;Caring for Others;Dressing;Hygiene/Grooming;Locomotion Level;Squat;Stairs;Stand    Examination-Participation Restrictions  Cleaning;Community Activity;Yard Work    [employer] officer  Evolving/Moderate complexity    Rehab Potential  Good    PT Frequency  3x / week    PT Duration  6 weeks    PT Treatment/Interventions  ADLs/Self Care Home Management;Manual techniques;Manual lymph drainage;Compression bandaging;Patient/family education;Therapeutic exercise;Other (comment)   wound care to include debridement   PT Next Visit Plan  Continue with manual techniqes to decrease induration. Begin compression bandaging to include toes when available.   Follow up on signed compression hose order and fax to [facility] when ready.   Wound care if wound  order has returned and needed.    PT Home Exercise Plan  LE exercises and diaphragmic breathing       Patient will benefit from skilled therapeutic intervention in order to improve the following deficits and  impairments:  Abnormal gait, Decreased activity tolerance, Decreased range of motion, Difficulty walking, Increased edema, Pain, Decreased skin integrity  Visit Diagnosis: Lymphedema, not elsewhere classified     Problem List Patient Active Problem List   Diagnosis Date Noted  . Stasis dermatitis of both legs 12/24/2018  . Leg pain, bilateral 12/24/2018  . Degenerative disc disease, lumbar 12/24/2018  . Fibromyalgia 12/24/2018  . Vitamin D deficiency 12/24/2018  . Class 3 severe obesity with serious comorbidity and body mass index (BMI) of 40.0 to 44.9 in adult ([place]) 07/23/2017  . Closed fracture of right inferior pubic ramus ([place]) 11/18/2016  . Gastroesophageal reflux disease 06/06/2016  . Recurrent major depressive disorder, in full remission ([place]) 06/06/2016  . DM type 2 with diabetic dyslipidemia ([place]) 03/06/2016  . Edema 09/14/2013  . Precordial pain 08/20/2011  . Essential hypertension, benign   . Hyperlipidemia    Rayetta Humphrey, [place] CLT [phone] 06/10/2019, 10:57 AM  [facility] [address] [place], [place], [address] Phone: [phone]   Fax:  [phone]  Name: BRISTEN BATCHELLER MRN: [id number] Date of Birth: 03-Oct-1945

## 2019-06-11 ENCOUNTER — Encounter (HOSPITAL_COMMUNITY): Payer: Medicare Other

## 2019-06-14 ENCOUNTER — Other Ambulatory Visit: Payer: Self-pay

## 2019-06-14 ENCOUNTER — Ambulatory Visit (HOSPITAL_COMMUNITY): Payer: Medicare Other | Admitting: Physical Therapy

## 2019-06-14 DIAGNOSIS — M5136 Other intervertebral disc degeneration, lumbar region: Secondary | ICD-10-CM | POA: Diagnosis not present

## 2019-06-14 DIAGNOSIS — S81801A Unspecified open wound, right lower leg, initial encounter: Secondary | ICD-10-CM | POA: Diagnosis not present

## 2019-06-14 DIAGNOSIS — M6281 Muscle weakness (generalized): Secondary | ICD-10-CM | POA: Diagnosis not present

## 2019-06-14 DIAGNOSIS — I890 Lymphedema, not elsewhere classified: Secondary | ICD-10-CM | POA: Diagnosis not present

## 2019-06-14 NOTE — Therapy (Signed)
[place], [place], [address] Phone: [phone]   Fax:  [phone]  Physical Therapy Treatment  Patient Details  Name: Tiffany Brown MRN: [id number] Date of Birth: 09-27-1945 Referring Provider (PT): Carloyn Jaeger   Encounter Date: 06/14/2019  PT End of Session - 06/14/19 1834    Visit Number  4    Number of Visits  18    Date for PT Re-Evaluation  07/19/19    Authorization Type  [employer] MEdic[employer]    Authorization - Visit Number  4    Authorization - Number of Visits  10    PT Start Time  1000    PT Stop Time  1130    PT Time Calculation (min)  90 min       Past Medical History:  Diagnosis Date  . Abnormal glucose   . Depression with anxiety   . Essential hypertension, benign   . Fibromyalgia   . Melanoma ([place])   . Near syncope   . Other and unspecified hyperlipidemia     Past Surgical History:  Procedure Laterality Date  . ABDOMINAL HYSTERECTOMY    . BREAST SURGERY     LEFT  . CHOLECYSTECTOMY    . ELBOW SURGERY     X's 3 Left  . RIGHT DISTAL RADIUS FRACTURE    . TOTAL KNEE ARTHROPLASTY     RIGHT    There were no vitals filed for this visit.  Subjective Assessment - 06/14/19 1832    Subjective  pt states she is excited to get her legs wrapped today.  STates she has more issues with the Rt upper thigh than anywwere else.  Currently without pain.    Currently in Pain?  No/denies                       [facility] Adult PT Treatment/Exercise - 06/14/19 0001      Manual Therapy   Manual Therapy  Manual Lymphatic Drainage (MLD);Compression Bandaging    Manual therapy comments  completed seperate from all other aspects of treatment.     Manual Lymphatic Drainage (MLD)  for bilateral LE's including short neck, deep and superficial abdominal and lumbar nodes, inguinal-axillary anastomosis bilaterally both supine and prone    Compression Bandaging  1/2" foam and multilayer short stretch  bandaging to knee level bilaterally.                PT Short Term Goals - 06/10/19 1055      PT SHORT TERM GOAL #1   Title  Pt wound on anterior aspect of Rt LE to be healed    Time  3    Period  Weeks    Status  Achieved    Target Date  06/28/19      PT SHORT TERM GOAL #2   Title  Pt volume in thigh to have decreased 2-3 cm; LE 1--2    Time  3    Period  Weeks    Status  On-going      PT SHORT TERM GOAL #3   Title  PT to be completing LE exercises at least daily to increase lymphatic circulation.    Time  3    Period  Weeks    Status  Achieved        PT Long Term Goals - 06/10/19 1056      PT LONG TERM GOAL #1   Title  PT to  have lost 4-6  cm of volume from her thighs  ;2-4 from her LE    Time  6    Period  Weeks    Status  On-going      PT LONG TERM GOAL #2   Title  Pt to no longer have erythema in her LE    Time  6    Period  Weeks    Status  On-going      PT LONG TERM GOAL #3   Title  PT to have new compression garments that provide 20-3-mmHG and to be wearing on a daily basis    Time  6    Period  Weeks    Status  On-going      PT LONG TERM GOAL #4   Title  PT to have obtained the biocompression sleeve for her pump , ( pt is going to try and see who she recieved the pump from).    Status  On-going            Plan - 06/14/19 1835    Clinical Impression Statement  Continued with manual lymph drainage and began bandaging to knee bilaterallly this sesison.  Explained we would begin thighs if she tolerates well.  pt instructed with care for bandaging and what to bring back.  Instructed to remove tomorrow evening or Wed morning.  Pt verbalized understanding.  Reported overall comfort with bandaging.    Personal Factors and Comorbidities  Fitness;Past/Current Experience;Time since onset of injury/illness/exacerbation    Comorbidities  HTN, frequent falls, Chronic lower back, ( has been having injections for years), and bilateral knee pain, Stasis  dermatitis    Examination-Activity Limitations  Bathing;Caring for Others;Dressing;Hygiene/Grooming;Locomotion Level;Squat;Stairs;Stand    Examination-Participation Restrictions  Cleaning;Community Activity;Yard Work    [employer] officer  Evolving/Moderate complexity    Rehab Potential  Good    PT Frequency  3x / week    PT Duration  6 weeks    PT Treatment/Interventions  ADLs/Self Care Home Management;Manual techniques;Manual lymph drainage;Compression bandaging;Patient/family education;Therapeutic exercise;Other (comment)   wound care to include debridement   PT Next Visit Plan  Continue with manual techniqes to decrease induration.  Follow up on signed compression hose order and fax to [facility] when ready.   Wound care if wound order has returned and needed.    PT Home Exercise Plan  LE exercises and diaphragmic breathing       Patient will benefit from skilled therapeutic intervention in order to improve the following deficits and impairments:  Abnormal gait, Decreased activity tolerance, Decreased range of motion, Difficulty walking, Increased edema, Pain, Decreased skin integrity  Visit Diagnosis: Lymphedema, not elsewhere classified     Problem List Patient Active Problem List   Diagnosis Date Noted  . Stasis dermatitis of both legs 12/24/2018  . Leg pain, bilateral 12/24/2018  . Degenerative disc disease, lumbar 12/24/2018  . Fibromyalgia 12/24/2018  . Vitamin D deficiency 12/24/2018  . Class 3 severe obesity with serious comorbidity and body mass index (BMI) of 40.0 to 44.9 in adult ([place]) 07/23/2017  . Closed fracture of right inferior pubic ramus ([place]) 11/18/2016  . Gastroesophageal reflux disease 06/06/2016  . Recurrent major depressive disorder, in full remission ([place]) 06/06/2016  . DM type 2 with diabetic dyslipidemia ([place]) 03/06/2016  . Edema 09/14/2013  . Precordial pain 08/20/2011  . Essential hypertension, benign   . Hyperlipidemia    Teena Irani, PTA/CLT [phone]  Roseanne Reno B 06/14/2019, 6:37 PM  [facility]  [place], [place], [address] Phone: [phone]   Fax:  [phone]  Name: Tiffany Brown MRN: [id number] Date of Birth: 04/20/1946

## 2019-06-16 ENCOUNTER — Other Ambulatory Visit: Payer: Self-pay

## 2019-06-16 ENCOUNTER — Ambulatory Visit (HOSPITAL_COMMUNITY): Payer: Medicare Other | Admitting: Physical Therapy

## 2019-06-16 ENCOUNTER — Encounter (HOSPITAL_COMMUNITY): Payer: Self-pay | Admitting: Physical Therapy

## 2019-06-16 DIAGNOSIS — I890 Lymphedema, not elsewhere classified: Secondary | ICD-10-CM | POA: Diagnosis not present

## 2019-06-16 DIAGNOSIS — S81801A Unspecified open wound, right lower leg, initial encounter: Secondary | ICD-10-CM | POA: Diagnosis not present

## 2019-06-16 DIAGNOSIS — M6281 Muscle weakness (generalized): Secondary | ICD-10-CM | POA: Diagnosis not present

## 2019-06-16 NOTE — Therapy (Addendum)
[place], [place], [address] Phone: [phone]   Fax:  [phone]  Physical Therapy Treatment  Patient Details  Name: Tiffany Brown MRN: [id number] Date of Birth: [birth year] Referring Provider (PT): Carloyn Jaeger   Encounter Date: 06/16/2019  PT End of Session - 06/16/19 1352    Visit Number  5    Number of Visits  18    Date for PT Re-Evaluation  07/19/19    Authorization Type  [employer] MEdicare    Authorization - Visit Number  5    Authorization - Number of Visits  10    PT Start Time  0920    PT Stop Time  1050    PT Time Calculation (min)  90 min    Activity Tolerance  Patient tolerated treatment well    Behavior During Therapy  [facility] for tasks assessed/performed       Past Medical History:  Diagnosis Date  . Abnormal glucose   . Depression with anxiety   . Essential hypertension, benign   . Fibromyalgia   . Melanoma ([place])   . Near syncope   . Other and unspecified hyperlipidemia     Past Surgical History:  Procedure Laterality Date  . ABDOMINAL HYSTERECTOMY    . BREAST SURGERY     LEFT  . CHOLECYSTECTOMY    . ELBOW SURGERY     X's 3 Left  . RIGHT DISTAL RADIUS FRACTURE    . TOTAL KNEE ARTHROPLASTY     RIGHT    There were no vitals filed for this visit.  Subjective Assessment - 06/16/19 1348    Subjective  Pt states that she is ready to have her entire legs wrapped. She had no problems with the LE except that she is claustrophobic and felt a little confined.    Pertinent History  HTN, frequent falls, Chronic lower back, ( has been having injections for years),  and bilateral knee pain, Stasis dermatitis    Limitations  Walking;Standing;House hold activities    How long can you sit comfortably?  no problem    How long can you stand comfortably?  10 -15 minutes    How long can you walk comfortably?  walks with a rollator 10-15  minutes    Patient Stated Goals  improve balance, feel more  confident with walking    Currently in Pain?  Yes    Pain Score  3    was a 5-6 prior to treatment.   Pain Location  Leg    Pain Orientation  Right;Left    Pain Descriptors / Indicators  Aching;Tightness    Pain Type  Chronic pain    Pain Onset  More than a month ago    Aggravating Factors   not sure    Pain Relieving Factors  therapy            LYMPHEDEMA/ONCOLOGY QUESTIONNAIRE - 06/16/19 [id number]      What other symptoms do you have   Are you Having Heaviness or Tightness  Yes    Are you having Pain  Yes    Are you having pitting edema  Yes    Body Site  LE    Is it Hard or Difficult finding clothes that fit  Yes    Do you have infections  No    Stemmer Sign  Yes      Lymphedema Stage   Stage  STAGE 2 SPONTANEOUSLY IRREVERSIBLE  Lymphedema Assessments   Lymphedema Assessments  Lower extremities      Right Lower Extremity Lymphedema   20 cm Proximal to Suprapatella  73.5 cm    10 cm Proximal to Suprapatella  59 cm    At Midpatella/Popliteal Crease  48 cm    30 cm Proximal to Floor at Lateral Plantar Foot  41.5 cm    20 cm Proximal to Floor at Lateral Plantar Foot  29.2 1    10  cm Proximal to Floor at Lateral Malleoli  24 cm    Circumference of ankle/heel  33.8 cm.    5 cm Proximal to 1st MTP Joint  23.9 cm    Across MTP Joint  24.3 cm    Around Proximal Great Toe  8.8 cm      Left Lower Extremity Lymphedema   20 cm Proximal to Suprapatella  72 cm    10 cm Proximal to Suprapatella  58.5 cm    At Midpatella/Popliteal Crease  46.5 cm    30 cm Proximal to Floor at Lateral Plantar Foot  39.3 cm    20 cm Proximal to Floor at Lateral Plantar Foot  29.5 cm    10 cm Proximal to Floor at Lateral Malleoli  24 cm    Circumference of ankle/heel  33 cm.    5 cm Proximal to 1st MTP Joint  24 cm    Across MTP Joint  23.1 cm    Around Proximal Great Toe  9.3 cm                [facility] Adult PT Treatment/Exercise - 06/16/19 0001      Manual Therapy   Manual  Therapy  Manual Lymphatic Drainage (MLD);Compression Bandaging    Manual therapy comments  completed seperate from all other aspects of treatment.     Manual Lymphatic Drainage (MLD)  for bilateral LE's including short neck, deep and superficial abdominal and lumbar nodes, inguinal-axillary anastomosis bilaterally supine only due to time restraints.    Compression Bandaging  1/2" foam and multilayer short stretch bandaging toe to thigh B               PT Short Term Goals - 06/10/19 1055      PT SHORT TERM GOAL #1   Title  Pt wound on anterior aspect of Rt LE to be healed    Time  3    Period  Weeks    Status  Achieved    Target Date  06/28/19      PT SHORT TERM GOAL #2   Title  Pt volume in thigh to have decreased 2-3 cm; LE 1--2    Time  3    Period  Weeks    Status  On-going      PT SHORT TERM GOAL #3   Title  PT to be completing LE exercises at least daily to increase lymphatic circulation.    Time  3    Period  Weeks    Status  Achieved        PT Long Term Goals - 06/10/19 1056      PT LONG TERM GOAL #1   Title  PT to have lost 4-6  cm of volume from her thighs  ;2-4 from her LE    Time  6    Period  Weeks    Status  On-going      PT LONG TERM GOAL #2   Title  Pt to no longer  have erythema in her LE    Time  6    Period  Weeks    Status  On-going      PT LONG TERM GOAL #3   Title  PT to have new compression garments that provide 20-3-mmHG and to be wearing on a daily basis    Time  6    Period  Weeks    Status  On-going      PT LONG TERM GOAL #4   Title  PT to have obtained the biocompression sleeve for her pump , ( pt is going to try and see who she recieved the pump from).    Status  On-going            Plan - 06/16/19 1352    Clinical Impression Statement  Pt measured with noted reduction in almost all areas.  PT's Rt LE has a bottleneck indention at mid thigh.  Therapist inquired if she was wearing anything tight on this area but pt  denied.  Therapist placed foam in this area to improve natural contour.  Contacted [employer] after pt had left .  PT has a four chambered compression pump and will need an eight chamber and will most likely do better with bio pants.Order was faxed to [facility] compression.    Personal Factors and Comorbidities  Fitness;Past/Current Experience;Time since onset of injury/illness/exacerbation    Comorbidities  HTN, frequent falls, Chronic lower back, ( has been having injections for years), and bilateral knee pain, Stasis dermatitis    Examination-Activity Limitations  Bathing;Caring for Others;Dressing;Hygiene/Grooming;Locomotion Level;Squat;Stairs;Stand    Examination-Participation Restrictions  Cleaning;Community Activity;Yard Work    [employer] officer  Evolving/Moderate complexity    Rehab Potential  Good    PT Frequency  3x / week    PT Duration  6 weeks    PT Treatment/Interventions  ADLs/Self Care Home Management;Manual techniques;Manual lymph drainage;Compression bandaging;Patient/family education;Therapeutic exercise;Other (comment)   wound care to include debridement   PT Next Visit Plan  Continue with total decongestive techniques measure for volume on Wednesdays. Let pt know that she will be getting contacted by [facility].   PT Home Exercise Plan  LE exercises and diaphragmic breathing       Patient will benefit from skilled therapeutic intervention in order to improve the following deficits and impairments:  Abnormal gait, Decreased activity tolerance, Decreased range of motion, Difficulty walking, Increased edema, Pain, Decreased skin integrity  Visit Diagnosis: Lymphedema, not elsewhere classified     Problem List Patient Active Problem List   Diagnosis Date Noted  . Stasis dermatitis of both legs 12/24/2018  . Leg pain, bilateral 12/24/2018  . Degenerative disc disease, lumbar 12/24/2018  . Fibromyalgia 12/24/2018  . Vitamin D deficiency  12/24/2018  . Class 3 severe obesity with serious comorbidity and body mass index (BMI) of 40.0 to 44.9 in adult ([place]) 07/23/2017  . Closed fracture of right inferior pubic ramus ([place]) 11/18/2016  . Gastroesophageal reflux disease 06/06/2016  . Recurrent major depressive disorder, in full remission ([place]) 06/06/2016  . DM type 2 with diabetic dyslipidemia ([place]) 03/06/2016  . Edema 09/14/2013  . Precordial pain 08/20/2011  . Essential hypertension, benign   . Hyperlipidemia   Rayetta Humphrey, [place] CLT [phone] 06/16/2019, 1:56 PM  [facility] [address] [place], [place], [address] Phone: [phone]   Fax:  [phone]  Name: SHANEIQUA MATALON MRN: [id number] Date of Birth: 10/13/1945

## 2019-06-17 ENCOUNTER — Ambulatory Visit (HOSPITAL_COMMUNITY): Payer: Medicare Other | Admitting: Physical Therapy

## 2019-06-18 ENCOUNTER — Encounter (HOSPITAL_COMMUNITY): Payer: Self-pay | Admitting: Physical Therapy

## 2019-06-18 ENCOUNTER — Ambulatory Visit (HOSPITAL_COMMUNITY): Payer: Medicare Other | Admitting: Physical Therapy

## 2019-06-18 ENCOUNTER — Other Ambulatory Visit: Payer: Self-pay

## 2019-06-18 ENCOUNTER — Encounter (HOSPITAL_COMMUNITY): Payer: Medicare Other

## 2019-06-18 DIAGNOSIS — M6281 Muscle weakness (generalized): Secondary | ICD-10-CM | POA: Diagnosis not present

## 2019-06-18 DIAGNOSIS — S81801A Unspecified open wound, right lower leg, initial encounter: Secondary | ICD-10-CM | POA: Diagnosis not present

## 2019-06-18 DIAGNOSIS — I890 Lymphedema, not elsewhere classified: Secondary | ICD-10-CM | POA: Diagnosis not present

## 2019-06-18 NOTE — Therapy (Signed)
[place], [place], [address] Phone: [phone]   Fax:  [phone]  Physical Therapy Treatment  Patient Details  Name: Tiffany Brown MRN: [id number] Date of Birth: [birth year] Referring Provider (PT): Carloyn Jaeger   Encounter Date: 06/18/2019  PT End of Session - 06/18/19 1617    Visit Number  6    Number of Visits  18    Date for PT Re-Evaluation  07/19/19    Authorization Type  [employer]    Authorization - Visit Number  6    Authorization - Number of Visits  10    PT Start Time  [id number]    PT Stop Time  1600    PT Time Calculation (min)  75 min    Activity Tolerance  Patient tolerated treatment well    Behavior During Therapy  [facility] for tasks assessed/performed       Past Medical History:  Diagnosis Date  . Abnormal glucose   . Depression with anxiety   . Essential hypertension, benign   . Fibromyalgia   . Melanoma ([place])   . Near syncope   . Other and unspecified hyperlipidemia     Past Surgical History:  Procedure Laterality Date  . ABDOMINAL HYSTERECTOMY    . BREAST SURGERY     LEFT  . CHOLECYSTECTOMY    . ELBOW SURGERY     X's 3 Left  . RIGHT DISTAL RADIUS FRACTURE    . TOTAL KNEE ARTHROPLASTY     RIGHT    There were no vitals filed for this visit.  Subjective Assessment - 06/18/19 1615    Subjective  Pt states that she had no problem with the bandaging to the thigh area, it felt good.    Pertinent History  HTN, frequent falls, Chronic lower back, ( has been having injections for years),  and bilateral knee pain, Stasis dermatitis    Limitations  Walking;Standing;House hold activities    How long can you sit comfortably?  no problem    How long can you stand comfortably?  10 -15 minutes    How long can you walk comfortably?  walks with a rollator 10-15  minutes    Patient Stated Goals  improve balance, feel more confident with walking    Pain Onset  More than a month ago                        [facility] Adult PT Treatment/Exercise - 06/18/19 0001      Manual Therapy   Manual Therapy  Manual Lymphatic Drainage (MLD);Compression Bandaging    Manual therapy comments  completed seperate from all other aspects of treatment.     Manual Lymphatic Drainage (MLD)  for bilateral LE's including short neck, deep and superficial abdominal and lumbar nodes, inguinal-axillary anastomosis bilaterally supine only due to time restraints.    Compression Bandaging  1/2" foam and multilayer short stretch bandaging toe to thigh B             PT Education - 06/18/19 1616    Education Details  Pt given sheet on how to care for short stretch bandages, requested to wash bandages on Sunday and pump twice.    Person(s) Educated  Patient    Methods  Explanation;Handout    Comprehension  Verbalized understanding       PT Short Term Goals - 06/10/19 1055      PT SHORT TERM GOAL #1  Title  Pt wound on anterior aspect of Rt LE to be healed    Time  3    Period  Weeks    Status  Achieved    Target Date  06/28/19      PT SHORT TERM GOAL #2   Title  Pt volume in thigh to have decreased 2-3 cm; LE 1--2    Time  3    Period  Weeks    Status  On-going      PT SHORT TERM GOAL #3   Title  PT to be completing LE exercises at least daily to increase lymphatic circulation.    Time  3    Period  Weeks    Status  Achieved        PT Long Term Goals - 06/10/19 1056      PT LONG TERM GOAL #1   Title  PT to have lost 4-6  cm of volume from her thighs  ;2-4 from her LE    Time  6    Period  Weeks    Status  On-going      PT LONG TERM GOAL #2   Title  Pt to no longer have erythema in her LE    Time  6    Period  Weeks    Status  On-going      PT LONG TERM GOAL #3   Title  PT to have new compression garments that provide 20-3-mmHG and to be wearing on a daily basis    Time  6    Period  Weeks    Status  On-going      PT LONG TERM GOAL #4   Title  PT to  have obtained the biocompression sleeve for her pump , ( pt is going to try and see who she recieved the pump from).    Status  On-going            Plan - 06/18/19 1617    Clinical Impression Statement  Pt thighs with noted decereased volume.  Pt LE were red superior to knee now distally to knee.  Pt will continue to benefit from total decongestive techniques until maximal decongestion has occured.    Personal Factors and Comorbidities  Fitness;Past/Current Experience;Time since onset of injury/illness/exacerbation    Comorbidities  HTN, frequent falls, Chronic lower back, ( has been having injections for years), and bilateral knee pain, Stasis dermatitis    Examination-Activity Limitations  Bathing;Caring for Others;Dressing;Hygiene/Grooming;Locomotion Level;Squat;Stairs;Stand    Examination-Participation Restrictions  Cleaning;Community Activity;Yard Work    [employer]  Evolving/Moderate complexity    Rehab Potential  Good    PT Frequency  3x / week    PT Duration  6 weeks    PT Treatment/Interventions  ADLs/Self Care Home Management;Manual techniques;Manual lymph drainage;Compression bandaging;Patient/family education;Therapeutic exercise;Other (comment)   wound care to include debridement   PT Next Visit Plan  Continue with total decongestive techniques measure for volume on Wednesdays. .    PT Home Exercise Plan  LE exercises and diaphragmic breathing       Patient will benefit from skilled therapeutic intervention in order to improve the following deficits and impairments:  Abnormal gait, Decreased activity tolerance, Decreased range of motion, Difficulty walking, Increased edema, Pain, Decreased skin integrity  Visit Diagnosis: Lymphedema, not elsewhere classified     Problem List Patient Active Problem List   Diagnosis Date Noted  . Stasis dermatitis of both legs 12/24/2018  . Leg pain, bilateral  12/24/2018  . Degenerative disc disease, lumbar  12/24/2018  . Fibromyalgia 12/24/2018  . Vitamin D deficiency 12/24/2018  . Class 3 severe obesity with serious comorbidity and body mass index (BMI) of 40.0 to 44.9 in adult ([place]) 07/23/2017  . Closed fracture of right inferior pubic ramus ([place]) 11/18/2016  . Gastroesophageal reflux disease 06/06/2016  . Recurrent major depressive disorder, in full remission ([place]) 06/06/2016  . DM type 2 with diabetic dyslipidemia ([place]) 03/06/2016  . Edema 09/14/2013  . Precordial pain 08/20/2011  . Essential hypertension, benign   . Hyperlipidemia     Rayetta Humphrey, [place] CLT [phone] 06/18/2019, 4:19 PM  [facility] [address] [place], [place], [address] Phone: [phone]   Fax:  [phone]  Name: Tiffany Brown MRN: [id number] Date of Birth: March 21, 1946

## 2019-06-21 ENCOUNTER — Other Ambulatory Visit: Payer: Self-pay

## 2019-06-21 ENCOUNTER — Ambulatory Visit (HOSPITAL_COMMUNITY): Payer: Medicare Other | Admitting: Physical Therapy

## 2019-06-21 DIAGNOSIS — I890 Lymphedema, not elsewhere classified: Secondary | ICD-10-CM | POA: Diagnosis not present

## 2019-06-21 DIAGNOSIS — M6281 Muscle weakness (generalized): Secondary | ICD-10-CM

## 2019-06-21 DIAGNOSIS — S81801A Unspecified open wound, right lower leg, initial encounter: Secondary | ICD-10-CM

## 2019-06-21 NOTE — Therapy (Signed)
[facility] [address] [place], [place], [address] Phone: [phone]   Fax:  [phone]  Physical Therapy Treatment  Patient Details  Name: Tiffany Brown MRN: [id number] Date of Birth: [birth year] Referring Provider (PT): Carloyn Jaeger   Encounter Date: 06/21/2019  PT End of Session - 06/21/19 1156    Visit Number  7    Number of Visits  18    Date for PT Re-Evaluation  07/19/19    Authorization Type  [employer]    Authorization - Visit Number  7    Authorization - Number of Visits  10    PT Start Time  1010    PT Stop Time  1140    PT Time Calculation (min)  90 min    Activity Tolerance  Patient tolerated treatment well    Behavior During Therapy  [facility] for tasks assessed/performed       Past Medical History:  Diagnosis Date  . Abnormal glucose   . Depression with anxiety   . Essential hypertension, benign   . Fibromyalgia   . Melanoma ([place])   . Near syncope   . Other and unspecified hyperlipidemia     Past Surgical History:  Procedure Laterality Date  . ABDOMINAL HYSTERECTOMY    . BREAST SURGERY     LEFT  . CHOLECYSTECTOMY    . ELBOW SURGERY     X's 3 Left  . RIGHT DISTAL RADIUS FRACTURE    . TOTAL KNEE ARTHROPLASTY     RIGHT    There were no vitals filed for this visit.  Subjective Assessment - 06/21/19 1206    Subjective  pt states she feels her legs are getting better and has not had any cramps like she usually had before starting therapy.  No pain or issues today.    Currently in Pain?  No/denies                       [facility] Adult PT Treatment/Exercise - 06/21/19 0001      Manual Therapy   Manual Therapy  Manual Lymphatic Drainage (MLD);Compression Bandaging    Manual therapy comments  completed seperate from all other aspects of treatment.     Manual Lymphatic Drainage (MLD)  for bilateral LE's including short neck, deep and superficial abdominal and lumbar nodes, inguinal-axillary  anastomosis bilaterally supine only due to time restraints.    Compression Bandaging  1/2" foam and multilayer short stretch bandaging toe to thigh B               PT Short Term Goals - 06/10/19 1055      PT SHORT TERM GOAL #1   Title  Pt wound on anterior aspect of Rt LE to be healed    Time  3    Period  Weeks    Status  Achieved    Target Date  06/28/19      PT SHORT TERM GOAL #2   Title  Pt volume in thigh to have decreased 2-3 cm; LE 1--2    Time  3    Period  Weeks    Status  On-going      PT SHORT TERM GOAL #3   Title  PT to be completing LE exercises at least daily to increase lymphatic circulation.    Time  3    Period  Weeks    Status  Achieved        PT Long  Term Goals - 06/10/19 1056      PT LONG TERM GOAL #1   Title  PT to have lost 4-6  cm of volume from her thighs  ;2-4 from her LE    Time  6    Period  Weeks    Status  On-going      PT LONG TERM GOAL #2   Title  Pt to no longer have erythema in her LE    Time  6    Period  Weeks    Status  On-going      PT LONG TERM GOAL #3   Title  PT to have new compression garments that provide 20-3-mmHG and to be wearing on a daily basis    Time  6    Period  Weeks    Status  On-going      PT LONG TERM GOAL #4   Title  PT to have obtained the biocompression sleeve for her pump , ( pt is going to try and see who she recieved the pump from).    Status  On-going            Plan - 06/21/19 1159    Clinical Impression Statement  pt reports getting favorable results from lymph therapy.  Continued with manual lymph drainage and compression bandaging to bilateral LE's up to thighs.   LE's remain extremely dry, especially Lt foot (heels and toes).  Moisturized these well prior to rebandaging.   Pt will be ready for measurements at next session.    Personal Factors and Comorbidities  Fitness;Past/Current Experience;Time since onset of injury/illness/exacerbation    Comorbidities  HTN, frequent falls,  Chronic lower back, ( has been having injections for years), and bilateral knee pain, Stasis dermatitis    Examination-Activity Limitations  Bathing;Caring for Others;Dressing;Hygiene/Grooming;Locomotion Level;Squat;Stairs;Stand    Examination-Participation Restrictions  Cleaning;Community Activity;Yard Work    [employer] officer  Evolving/Moderate complexity    Rehab Potential  Good    PT Frequency  3x / week    PT Duration  6 weeks    PT Treatment/Interventions  ADLs/Self Care Home Management;Manual techniques;Manual lymph drainage;Compression bandaging;Patient/family education;Therapeutic exercise;Other (comment)   wound care to include debridement   PT Next Visit Plan  Continue with total decongestive techniques measure for volume on Wednesdays. .    PT Home Exercise Plan  LE exercises and diaphragmic breathing       Patient will benefit from skilled therapeutic intervention in order to improve the following deficits and impairments:  Abnormal gait, Decreased activity tolerance, Decreased range of motion, Difficulty walking, Increased edema, Pain, Decreased skin integrity  Visit Diagnosis: Lymphedema, not elsewhere classified  Leg wound, right, initial encounter  Muscle weakness (generalized)     Problem List Patient Active Problem List   Diagnosis Date Noted  . Stasis dermatitis of both legs 12/24/2018  . Leg pain, bilateral 12/24/2018  . Degenerative disc disease, lumbar 12/24/2018  . Fibromyalgia 12/24/2018  . Vitamin D deficiency 12/24/2018  . Class 3 severe obesity with serious comorbidity and body mass index (BMI) of 40.0 to 44.9 in adult ([place]) 07/23/2017  . Closed fracture of right inferior pubic ramus ([place]) 11/18/2016  . Gastroesophageal reflux disease 06/06/2016  . Recurrent major depressive disorder, in full remission ([place]) 06/06/2016  . DM type 2 with diabetic dyslipidemia ([place]) 03/06/2016  . Edema 09/14/2013  . Precordial pain 08/20/2011  .  Essential hypertension, benign   . Hyperlipidemia    Teena Irani, PTA/CLT [phone]  Teena Irani 06/21/2019, 12:08 PM  [place], [place], [address] Phone: [phone]   Fax:  [phone]  Name: Tiffany Brown MRN: [id number] Date of Birth: [birth year]

## 2019-06-23 ENCOUNTER — Other Ambulatory Visit: Payer: Self-pay

## 2019-06-23 ENCOUNTER — Encounter (HOSPITAL_COMMUNITY): Payer: Self-pay | Admitting: Physical Therapy

## 2019-06-23 ENCOUNTER — Ambulatory Visit (HOSPITAL_COMMUNITY): Payer: Medicare Other | Admitting: Physical Therapy

## 2019-06-23 DIAGNOSIS — S81801A Unspecified open wound, right lower leg, initial encounter: Secondary | ICD-10-CM | POA: Diagnosis not present

## 2019-06-23 DIAGNOSIS — M6281 Muscle weakness (generalized): Secondary | ICD-10-CM | POA: Diagnosis not present

## 2019-06-23 DIAGNOSIS — I890 Lymphedema, not elsewhere classified: Secondary | ICD-10-CM | POA: Diagnosis not present

## 2019-06-23 NOTE — Therapy (Signed)
[facility] [address] [place], [place], [address] Phone: [phone]   Fax:  [phone]  Physical Therapy Treatment  Patient Details  Name: Tiffany Brown MRN: [id number] Date of Birth: 11/16/1945 Referring Provider (PT): Carloyn Jaeger   Encounter Date: 06/23/2019  PT End of Session - 06/23/19 1306    Visit Number  8    Number of Visits  18    Date for PT Re-Evaluation  07/19/19    Authorization Type  [employer] M[employer]    Authorization - Visit Number  8    Authorization - Number of Visits  10    PT Start Time  1012    PT Stop Time  1137    PT Time Calculation (min)  85 min    Activity Tolerance  Patient tolerated treatment well    Behavior During Therapy  [facility] for tasks assessed/performed       Past Medical History:  Diagnosis Date  . Abnormal glucose   . Depression with anxiety   . Essential hypertension, benign   . Fibromyalgia   . Melanoma ([place])   . Near syncope   . Other and unspecified hyperlipidemia     Past Surgical History:  Procedure Laterality Date  . ABDOMINAL HYSTERECTOMY    . BREAST SURGERY     LEFT  . CHOLECYSTECTOMY    . ELBOW SURGERY     X's 3 Left  . RIGHT DISTAL RADIUS FRACTURE    . TOTAL KNEE ARTHROPLASTY     RIGHT    There were no vitals filed for this visit.  Subjective Assessment - 06/23/19 1305    Subjective  Pt continues to have no pain.    Pertinent History  HTN, frequent falls, Chronic lower back, ( has been having injections for years),  and bilateral knee pain, Stasis dermatitis    Limitations  Walking;Standing;House hold activities    How long can you sit comfortably?  no problem    How long can you stand comfortably?  10 -15 minutes    How long can you walk comfortably?  walks with a rollator 10-15  minutes    Patient Stated Goals  improve balance, feel more confident with walking    Currently in Pain?  No/denies    Pain Onset  More than a month ago             LYMPHEDEMA/ONCOLOGY QUESTIONNAIRE - 06/23/19 1025      What other symptoms do you have   Are you Having Heaviness or Tightness  Yes  (Pended)     Are you having Pain  Yes  (Pended)     Are you having pitting edema  Yes  (Pended)     Body Site  LE  (Pended)     Is it Hard or Difficult finding clothes that fit  Yes  (Pended)     Do you have infections  No  (Pended)     Stemmer Sign  Yes  (Pended)       Lymphedema Stage   Stage  STAGE 2 SPONTANEOUSLY IRREVERSIBLE  (Pended)       Lymphedema Assessments   Lymphedema Assessments  Lower extremities  (Pended)       Right Lower Extremity Lymphedema   20 cm Proximal to Suprapatella  73.5 cm  (Pended)     10 cm Proximal to Suprapatella  60 cm  (Pended)     At Midpatella/Popliteal Crease  48.5 cm  (Pended)  30 cm Proximal to Floor at Lateral Plantar Foot  42 cm  (Pended)     20 cm Proximal to Floor at Lateral Plantar Foot  29.2 1  (Pended)     10 cm Proximal to Floor at Lateral Malleoli  24 cm  (Pended)     Circumference of ankle/heel  33.8 cm.  (Pended)     5 cm Proximal to 1st MTP Joint  23.9 cm  (Pended)     Across MTP Joint  24.3 cm  (Pended)     Around Proximal Great Toe  8.8 cm  (Pended)       Left Lower Extremity Lymphedema   20 cm Proximal to Suprapatella  72 cm  (Pended)     10 cm Proximal to Suprapatella  57.5 cm  (Pended)     At Midpatella/Popliteal Crease  46.5 cm  (Pended)     30 cm Proximal to Floor at Lateral Plantar Foot  39.7 cm  (Pended)     20 cm Proximal to Floor at Lateral Plantar Foot  29.5 cm  (Pended)     10 cm Proximal to Floor at Lateral Malleoli  23.7 cm  (Pended)     Circumference of ankle/heel  33 cm.  (Pended)     5 cm Proximal to 1st MTP Joint  24 cm  (Pended)     Across MTP Joint  23.1 cm  (Pended)     Around Proximal Great Toe  9.3 cm  (Pended)                           PT Short Term Goals - 06/10/19 1055      PT SHORT TERM GOAL #1   Title  Pt wound on anterior  aspect of Rt LE to be healed    Time  3    Period  Weeks    Status  Achieved    Target Date  06/28/19      PT SHORT TERM GOAL #2   Title  Pt volume in thigh to have decreased 2-3 cm; LE 1--2    Time  3    Period  Weeks    Status  On-going      PT SHORT TERM GOAL #3   Title  PT to be completing LE exercises at least daily to increase lymphatic circulation.    Time  3    Period  Weeks    Status  Achieved        PT Long Term Goals - 06/10/19 1056      PT LONG TERM GOAL #1   Title  PT to have lost 4-6  cm of volume from her thighs  ;2-4 from her LE    Time  6    Period  Weeks    Status  On-going      PT LONG TERM GOAL #2   Title  Pt to no longer have erythema in her LE    Time  6    Period  Weeks    Status  On-going      PT LONG TERM GOAL #3   Title  PT to have new compression garments that provide 20-3-mmHG and to be wearing on a daily basis    Time  6    Period  Weeks    Status  On-going      PT LONG TERM GOAL #4   Title  PT to have obtained the  biocompression sleeve for her pump , ( pt is going to try and see who she recieved the pump from).    Status  On-going            Plan - 06/23/19 1307    Clinical Impression Statement  Pt measured for volume with minimal changes this week, however pt pain has decreased from a 7-8 to 0.  Pt and therapist discussed options for compression wear from pantyhose or capri with knee high.  Therapist measured pt for pantyhose and pt will call outlet to see if she is able to fit properly into a size due to ratio of waist/hip size vs thighs pt may need to be custom fitted.    Personal Factors and Comorbidities  Fitness;Past/Current Experience;Time since onset of injury/illness/exacerbation    Comorbidities  HTN, frequent falls, Chronic lower back, ( has been having injections for years), and bilateral knee pain, Stasis dermatitis    Examination-Activity Limitations  Bathing;Caring for Others;Dressing;Hygiene/Grooming;Locomotion  Level;Squat;Stairs;Stand    Examination-Participation Restrictions  Cleaning;Community Activity;Yard Work    [employer] officer  Evolving/Moderate complexity    Rehab Potential  Good    PT Frequency  3x / week    PT Duration  6 weeks    PT Treatment/Interventions  ADLs/Self Care Home Management;Manual techniques;Manual lymph drainage;Compression bandaging;Patient/family education;Therapeutic exercise;Other (comment)   wound care to include debridement   PT Next Visit Plan  Continue with total decongestive techniques measure for volume on Wednesdays. .    PT Home Exercise Plan  LE exercises and diaphragmic breathing    Recommended Other Services  Measurement:  waist 53 1/2"; hips 60", thigh 28.5", calf 17.5", ankle 9 3/4 "       Patient will benefit from skilled therapeutic intervention in order to improve the following deficits and impairments:  Abnormal gait, Decreased activity tolerance, Decreased range of motion, Difficulty walking, Increased edema, Pain, Decreased skin integrity  Visit Diagnosis: Lymphedema, not elsewhere classified  Leg wound, right, initial encounter     Problem List Patient Active Problem List   Diagnosis Date Noted  . Stasis dermatitis of both legs 12/24/2018  . Leg pain, bilateral 12/24/2018  . Degenerative disc disease, lumbar 12/24/2018  . Fibromyalgia 12/24/2018  . Vitamin D deficiency 12/24/2018  . Class 3 severe obesity with serious comorbidity and body mass index (BMI) of 40.0 to 44.9 in adult ([place]) 07/23/2017  . Closed fracture of right inferior pubic ramus ([place]) 11/18/2016  . Gastroesophageal reflux disease 06/06/2016  . Recurrent major depressive disorder, in full remission ([place]) 06/06/2016  . DM type 2 with diabetic dyslipidemia ([place]) 03/06/2016  . Edema 09/14/2013  . Precordial pain 08/20/2011  . Essential hypertension, benign   . Hyperlipidemia    Rayetta Humphrey, [place] CLT [phone] 06/23/2019, 1:12 PM  [facility] [address] [place], [place], [address] Phone: [phone]   Fax:  [phone]  Name: KIJANA POPOVICH MRN: [id number] Date of Birth: 1946/01/18

## 2019-06-25 ENCOUNTER — Encounter (HOSPITAL_COMMUNITY): Payer: Self-pay | Admitting: Physical Therapy

## 2019-06-25 ENCOUNTER — Ambulatory Visit (HOSPITAL_COMMUNITY): Payer: Medicare Other | Admitting: Physical Therapy

## 2019-06-25 ENCOUNTER — Other Ambulatory Visit: Payer: Self-pay

## 2019-06-25 DIAGNOSIS — M6281 Muscle weakness (generalized): Secondary | ICD-10-CM | POA: Diagnosis not present

## 2019-06-25 DIAGNOSIS — I890 Lymphedema, not elsewhere classified: Secondary | ICD-10-CM

## 2019-06-25 DIAGNOSIS — S81801A Unspecified open wound, right lower leg, initial encounter: Secondary | ICD-10-CM | POA: Diagnosis not present

## 2019-06-25 NOTE — Therapy (Signed)
[facility] [address] [place], [place], [address] Phone: [phone]   Fax:  [phone]  Physical Therapy Treatment  Patient Details  Name: Tiffany Brown MRN: [id number] Date of Birth: 09/12/1945 Referring Provider (PT): Carloyn Jaeger   Encounter Date: 06/25/2019  PT End of Session - 06/25/19 1418    Visit Number  9    Number of Visits  18    Date for PT Re-Evaluation  07/19/19    Authorization Type  [employer] M[employer]    Authorization - Visit Number  9    Authorization - Number of Visits  10    PT Start Time  0915    PT Stop Time  1035    PT Time Calculation (min)  80 min    Activity Tolerance  Patient tolerated treatment well    Behavior During Therapy  [facility] for tasks assessed/performed       Past Medical History:  Diagnosis Date  . Abnormal glucose   . Depression with anxiety   . Essential hypertension, benign   . Fibromyalgia   . Melanoma ([place])   . Near syncope   . Other and unspecified hyperlipidemia     Past Surgical History:  Procedure Laterality Date  . ABDOMINAL HYSTERECTOMY    . BREAST SURGERY     LEFT  . CHOLECYSTECTOMY    . ELBOW SURGERY     X's 3 Left  . RIGHT DISTAL RADIUS FRACTURE    . TOTAL KNEE ARTHROPLASTY     RIGHT    There were no vitals filed for this visit.  Subjective Assessment - 06/25/19 1417    Subjective  PT states that she continues to have to void more frequently.  Pt states that she found her other bandages, they were in a different room    Pertinent History  HTN, frequent falls, Chronic lower back, ( has been having injections for years),  and bilateral knee pain, Stasis dermatitis    Limitations  Walking;Standing;House hold activities    How long can you sit comfortably?  no problem    How long can you stand comfortably?  10 -15 minutes    How long can you walk comfortably?  walks with a rollator 10-15  minutes    Patient Stated Goals  improve balance, feel more confident with  walking    Currently in Pain?  No/denies    Pain Onset  More than a month ago                       [facility] Adult PT Treatment/Exercise - 06/25/19 0001      Manual Therapy   Manual Therapy  Manual Lymphatic Drainage (MLD);Compression Bandaging    Manual therapy comments  completed seperate from all other aspects of treatment.     Manual Lymphatic Drainage (MLD)  for bilateral LE's including short neck, deep and superficial abdominal and lumbar nodes, inguinal-axillary anastomosis bilaterally supine only due to time restraints.    Compression Bandaging  1/2" foam and multilayer short stretch bandaging toe to thigh B             PT Education - 06/25/19 1421    Education Details  self manual 1-4       PT Short Term Goals - 06/10/19 1055      PT SHORT TERM GOAL #1   Title  Pt wound on anterior aspect of Rt LE to be healed    Time  3    Period  Weeks    Status  Achieved    Target Date  06/28/19      PT SHORT TERM GOAL #2   Title  Pt volume in thigh to have decreased 2-3 cm; LE 1--2    Time  3    Period  Weeks    Status  On-going      PT SHORT TERM GOAL #3   Title  PT to be completing LE exercises at least daily to increase lymphatic circulation.    Time  3    Period  Weeks    Status  Achieved        PT Long Term Goals - 06/10/19 1056      PT LONG TERM GOAL #1   Title  PT to have lost 4-6  cm of volume from her thighs  ;2-4 from her LE    Time  6    Period  Weeks    Status  On-going      PT LONG TERM GOAL #2   Title  Pt to no longer have erythema in her LE    Time  6    Period  Weeks    Status  On-going      PT LONG TERM GOAL #3   Title  PT to have new compression garments that provide 20-3-mmHG and to be wearing on a daily basis    Time  6    Period  Weeks    Status  On-going      PT LONG TERM GOAL #4   Title  PT to have obtained the biocompression sleeve for her pump , ( pt is going to try and see who she recieved the pump from).     Status  On-going            Plan - 06/25/19 1419    Clinical Impression Statement  Pt LE redness is not as intense as the initial evaluation.  Therapist used extra 12cm on each thigh area to improve thigh decongestion.  Began self manual techniques 1-4    Personal Factors and Comorbidities  Fitness;Past/Current Experience;Time since onset of injury/illness/exacerbation    Comorbidities  HTN, frequent falls, Chronic lower back, ( has been having injections for years), and bilateral knee pain, Stasis dermatitis    Examination-Activity Limitations  Bathing;Caring for Others;Dressing;Hygiene/Grooming;Locomotion Level;Squat;Stairs;Stand    Examination-Participation Restrictions  Cleaning;Community Activity;Yard Work    [employer] officer  Evolving/Moderate complexity    Rehab Potential  Good    PT Frequency  3x / week    PT Duration  6 weeks    PT Treatment/Interventions  ADLs/Self Care Home Management;Manual techniques;Manual lymph drainage;Compression bandaging;Patient/family education;Therapeutic exercise;Other (comment)   wound care to include debridement   PT Next Visit Plan  Continue with total decongestive techniques measure for volume Monday next week as this will be pt 10th visit.  Review self manual techniqes.    PT Home Exercise Plan  LE exercises and diaphragmic breathing       Patient will benefit from skilled therapeutic intervention in order to improve the following deficits and impairments:  Abnormal gait, Decreased activity tolerance, Decreased range of motion, Difficulty walking, Increased edema, Pain, Decreased skin integrity  Visit Diagnosis: Lymphedema, not elsewhere classified     Problem List Patient Active Problem List   Diagnosis Date Noted  . Stasis dermatitis of both legs 12/24/2018  . Leg pain, bilateral 12/24/2018  . Degenerative disc disease, lumbar 12/24/2018  .  Fibromyalgia 12/24/2018  . Vitamin D deficiency 12/24/2018  . Class 3  severe obesity with serious comorbidity and body mass index (BMI) of 40.0 to 44.9 in adult ([place]) 07/23/2017  . Closed fracture of right inferior pubic ramus ([place]) 11/18/2016  . Gastroesophageal reflux disease 06/06/2016  . Recurrent major depressive disorder, in full remission ([place]) 06/06/2016  . DM type 2 with diabetic dyslipidemia ([place]) 03/06/2016  . Edema 09/14/2013  . Precordial pain 08/20/2011  . Essential hypertension, benign   . Hyperlipidemia     Rayetta Humphrey, PT CLT [phone] 06/25/2019, 2:23 PM  [facility] [place], [place], [address] Phone: [phone]   Fax:  [phone]  Name: Tiffany Brown MRN: [id number] Date of Birth: Aug 08, 1945

## 2019-06-28 ENCOUNTER — Other Ambulatory Visit: Payer: Self-pay

## 2019-06-28 ENCOUNTER — Telehealth (HOSPITAL_COMMUNITY): Payer: Self-pay | Admitting: Physical Therapy

## 2019-06-28 ENCOUNTER — Ambulatory Visit (HOSPITAL_COMMUNITY): Payer: Medicare Other | Admitting: Physical Therapy

## 2019-06-28 DIAGNOSIS — I890 Lymphedema, not elsewhere classified: Secondary | ICD-10-CM | POA: Diagnosis not present

## 2019-06-28 DIAGNOSIS — S81801A Unspecified open wound, right lower leg, initial encounter: Secondary | ICD-10-CM | POA: Diagnosis not present

## 2019-06-28 DIAGNOSIS — M6281 Muscle weakness (generalized): Secondary | ICD-10-CM | POA: Diagnosis not present

## 2019-06-28 NOTE — Telephone Encounter (Signed)
Sent referral for wound care 3xs CR said to forget it today. Cx the wound request

## 2019-06-28 NOTE — Therapy (Addendum)
Tiffany Brown, [place], [address] Phone: [phone]   Fax:  [phone]  Physical Therapy Treatment Progress Note Reporting Period 06/07/2019  to  06/28/2019  See note below for Objective Data and Assessment of Progress/Goals.      Patient Details  Name: Tiffany Brown MRN: [id number] Date of Birth: [birth year] Referring Provider (PT): Carloyn Jaeger   Encounter Date: 06/28/2019  PT End of Session - 06/28/19 1811    Visit Number  10    Number of Visits  18    Date for PT Re-Evaluation  07/19/19    Authorization Type  [employer] MEd[employer]    Authorization - Visit Number  10    Authorization - Number of Visits  10    PT Start Time  [id number]    PT Stop Time  1142    PT Time Calculation (min)  84 min    Activity Tolerance  Patient tolerated treatment well    Behavior During Therapy  [facility] for tasks assessed/performed       Past Medical History:  Diagnosis Date  . Abnormal glucose   . Depression with anxiety   . Essential hypertension, benign   . Fibromyalgia   . Melanoma ([place])   . Near syncope   . Other and unspecified hyperlipidemia     Past Surgical History:  Procedure Laterality Date  . ABDOMINAL HYSTERECTOMY    . BREAST SURGERY     LEFT  . CHOLECYSTECTOMY    . ELBOW SURGERY     X's 3 Left  . RIGHT DISTAL RADIUS FRACTURE    . TOTAL KNEE ARTHROPLASTY     RIGHT    There were no vitals filed for this visit.  Subjective Assessment - 06/28/19 1804    Subjective  pt states she is doing well today.  finally able to wear a pair of tennis shoes that were too small on her feet.    Currently in Pain?  No/denies            LYMPHEDEMA/ONCOLOGY QUESTIONNAIRE - 06/28/19 1805      What other symptoms do you have   Are you Having Heaviness or Tightness  Yes    Are you having Pain  Yes    Are you having pitting edema  Yes    Body Site  LE    Is it Hard or Difficult finding clothes that fit  Yes    Do you have  infections  No    Stemmer Sign  Yes      Lymphedema Stage   Stage  STAGE 2 SPONTANEOUSLY IRREVERSIBLE      Lymphedema Assessments   Lymphedema Assessments  Lower extremities      Right Lower Extremity Lymphedema   20 cm Proximal to Suprapatella  71.5 cm   was 74.5 cm at initial eval 2/01   10 cm Proximal to Suprapatella  59 cm   was 63.9 cm   At Midpatella/Popliteal Crease  48 cm   was 50 cm   30 cm Proximal to Floor at Lateral Plantar Foot  41 cm   was 44 cm   20 cm Proximal to Floor at Lateral Plantar Foot  31.9 1   was 34.2 cm   10 cm Proximal to Floor at Lateral Malleoli  23.7 cm   was 22.4 cm   Circumference of ankle/heel  32.7 cm.   wa 34.7 cm   5 cm Proximal to 1st MTP Joint  24.3 cm   was 24.6 cm   Across MTP Joint  24.4 cm   was 24.9 cm   Around Proximal Great Toe  8 cm   was 9 cm     Left Lower Extremity Lymphedema   20 cm Proximal to Suprapatella  70 cm   was 72 cm at initial evaluaiton on 2/01   10 cm Proximal to Suprapatella  56.5 cm   was 59.5 cm   At Midpatella/Popliteal Crease  45.4 cm   was 49.5 cm   30 cm Proximal to Floor at Lateral Plantar Foot  38.9 cm   was 41.3 cm   20 cm Proximal to Floor at Lateral Plantar Foot  30 cm   was 30.8 cm   10 cm Proximal to Floor at Lateral Malleoli  22.8 cm   was 24 cm   Circumference of ankle/heel  32.3 cm.   was 34.5 cm   5 cm Proximal to 1st MTP Joint  24 cm   was 24.5 cm   Across MTP Joint  22.8 cm   was 23.5 cm   Around Proximal Great Toe  8 cm   was 9.5 cm      Pt did have a blister on the lateral aspect of her RT LE which is now healed.          [facility] Adult PT Treatment/Exercise - 06/28/19 0001      Manual Therapy   Manual Therapy  Manual Lymphatic Drainage (MLD);Compression Bandaging    Manual therapy comments  completed seperate from all other aspects of treatment.     Manual Lymphatic Drainage (MLD)  for bilateral LE's including short neck, deep and superficial abdominal and lumbar  nodes, inguinal-axillary anastomosis bilaterally supine only due to time restraints.    Compression Bandaging  1/2" foam and multilayer short stretch bandaging toe to thigh B               PT Short Term Goals - 06/10/19 1055      PT SHORT TERM GOAL #1   Title  Pt wound on anterior aspect of Rt LE to be healed    Time  3    Period  Weeks    Status  Achieved    Target Date  06/28/19      PT SHORT TERM GOAL #2   Title  Pt volume in thigh to have decreased 2-3 cm; LE 1--2    Time  3    Period  Weeks    Status  Achieved     PT SHORT TERM GOAL #3   Title  PT to be completing LE exercises at least daily to increase lymphatic circulation.    Time  3    Period  Weeks    Status  Achieved        PT Long Term Goals - 06/10/19 1056      PT LONG TERM GOAL #1   Title  PT to have lost 4-6  cm of volume from her thighs  ;2-4 from her LE    Time  6    Period  Weeks    Status  On-going      PT LONG TERM GOAL #2   Title  Pt to no longer have erythema in her LE    Time  6    Period  Weeks    Status  On-going      PT LONG TERM GOAL #3   Title  PT to  have new compression garments that provide 20-3-mmHG and to be wearing on a daily basis    Time  6    Period  Weeks    Status  On-going      PT LONG TERM GOAL #4   Title  PT to have obtained the biocompression sleeve for her pump , ( pt is going to try and see who she recieved the pump from).    Status  On-going            Plan - 06/28/19 1812    Clinical Impression Statement  LE's remeasured this session with overall improvement as compared to initial evaluation.  Less redness noted in LE's and pateint reporting overall improvement in LE's.  Wound on Rt LE has healed.  Pt will benefit from continued skilled physical therapy for manual lymph drainage and compression bandaging to thigh until maximal decongestion has occurred..  Pt still awaiting pump; reviewed self manual with pateint.    Personal Factors and Comorbidities   Fitness;Past/Current Experience;Time since onset of injury/illness/exacerbation    Comorbidities  HTN, frequent falls, Chronic lower back, ( has been having injections for years), and bilateral knee pain, Stasis dermatitis    Examination-Activity Limitations  Bathing;Caring for Others;Dressing;Hygiene/Grooming;Locomotion Level;Squat;Stairs;Stand    Examination-Participation Restrictions  Cleaning;Community Activity;Yard Work    [employer] officer  Evolving/Moderate complexity    Rehab Potential  Good    PT Frequency  3x / week    PT Duration  6 weeks    PT Treatment/Interventions  ADLs/Self Care Home Management;Manual techniques;Manual lymph drainage;Compression bandaging;Patient/family education;Therapeutic exercise;Other (comment)   wound care to include debridement   PT Next Visit Plan  Continue with total decongestive techniques measure for volume each week.  Review self manual techniqes.    PT Home Exercise Plan  LE exercises and diaphragmic breathing       Patient will benefit from skilled therapeutic intervention in order to improve the following deficits and impairments:  Abnormal gait, Decreased activity tolerance, Decreased range of motion, Difficulty walking, Increased edema, Pain, Decreased skin integrity  Visit Diagnosis: Lymphedema, not elsewhere classified  Leg wound, right, initial encounter     Problem List Patient Active Problem List   Diagnosis Date Noted  . Stasis dermatitis of both legs 12/24/2018  . Leg pain, bilateral 12/24/2018  . Degenerative disc disease, lumbar 12/24/2018  . Fibromyalgia 12/24/2018  . Vitamin D deficiency 12/24/2018  . Class 3 severe obesity with serious comorbidity and body mass index (BMI) of 40.0 to 44.9 in adult ([place]) 07/23/2017  . Closed fracture of right inferior pubic ramus ([place]) 11/18/2016  . Gastroesophageal reflux disease 06/06/2016  . Recurrent major depressive disorder, in full remission ([place]) 06/06/2016  .  DM type 2 with diabetic dyslipidemia ([place]) 03/06/2016  . Edema 09/14/2013  . Precordial pain 08/20/2011  . Essential hypertension, benign   . Hyperlipidemia    Teena Irani, PTA/CLT [place], PT CLT [phone] 06/28/2019, 6:15 PM  [facility] [address] [place], [place], [address] Phone: [phone]   Fax:  [phone]  Name: CABRINA DAHLGREN MRN: [id number] Date of Birth: 1946-02-02

## 2019-06-30 ENCOUNTER — Ambulatory Visit (HOSPITAL_COMMUNITY): Payer: Medicare Other | Admitting: Physical Therapy

## 2019-06-30 ENCOUNTER — Other Ambulatory Visit: Payer: Self-pay | Admitting: Family Medicine

## 2019-06-30 ENCOUNTER — Other Ambulatory Visit: Payer: Self-pay

## 2019-06-30 ENCOUNTER — Other Ambulatory Visit: Payer: Medicare Other

## 2019-06-30 DIAGNOSIS — E1169 Type 2 diabetes mellitus with other specified complication: Secondary | ICD-10-CM | POA: Diagnosis not present

## 2019-06-30 DIAGNOSIS — E785 Hyperlipidemia, unspecified: Secondary | ICD-10-CM

## 2019-06-30 DIAGNOSIS — S81801A Unspecified open wound, right lower leg, initial encounter: Secondary | ICD-10-CM | POA: Diagnosis not present

## 2019-06-30 DIAGNOSIS — I890 Lymphedema, not elsewhere classified: Secondary | ICD-10-CM

## 2019-06-30 DIAGNOSIS — M6281 Muscle weakness (generalized): Secondary | ICD-10-CM | POA: Diagnosis not present

## 2019-06-30 LAB — BAYER DCA HB A1C WAIVED: HB A1C (BAYER DCA - WAIVED): 6.9 % (ref <7.0)

## 2019-06-30 NOTE — Progress Notes (Signed)
Placed lab orders for the patient 

## 2019-06-30 NOTE — Therapy (Signed)
[facility] [address] [place], [place], [address] Phone: [phone]   Fax:  [phone]  Physical Therapy Treatment  Patient Details  Name: Tiffany Brown MRN: [id number] Date of Birth: March 13, 1946 Referring Provider (PT): Carloyn Jaeger   Encounter Date: 06/30/2019  PT End of Session - 06/30/19 1728    Visit Number  11    Number of Visits  18    Date for PT Re-Evaluation  07/19/19    Authorization Type  [employer] MEdic[employer]    Authorization - Visit Number  1    Authorization - Number of Visits  10    Progress Note Due on Visit  20    PT Start Time  1450    PT Stop Time  1620    PT Time Calculation (min)  90 min    Activity Tolerance  Patient tolerated treatment well    Behavior During Therapy  [facility] for tasks assessed/performed       Past Medical History:  Diagnosis Date  . Abnormal glucose   . Depression with anxiety   . Essential hypertension, benign   . Fibromyalgia   . Melanoma ([place])   . Near syncope   . Other and unspecified hyperlipidemia     Past Surgical History:  Procedure Laterality Date  . ABDOMINAL HYSTERECTOMY    . BREAST SURGERY     LEFT  . CHOLECYSTECTOMY    . ELBOW SURGERY     X's 3 Left  . RIGHT DISTAL RADIUS FRACTURE    . TOTAL KNEE ARTHROPLASTY     RIGHT    There were no vitals filed for this visit.  Subjective Assessment - 06/30/19 1727    Subjective  pt states she conitnues to do better and her legs feel so much better.    Currently in Pain?  No/denies                       [facility] Adult PT Treatment/Exercise - 06/30/19 0001      Manual Therapy   Manual Therapy  Manual Lymphatic Drainage (MLD);Compression Bandaging    Manual therapy comments  completed seperate from all other aspects of treatment.     Manual Lymphatic Drainage (MLD)  for bilateral LE's including short neck, deep and superficial abdominal and lumbar nodes, inguinal-axillary anastomosis bilaterally supine only due to  time restraints.    Compression Bandaging  1/2" foam and multilayer short stretch bandaging toe to thigh B               PT Short Term Goals - 06/10/19 1055      PT SHORT TERM GOAL #1   Title  Pt wound on anterior aspect of Rt LE to be healed    Time  3    Period  Weeks    Status  Achieved    Target Date  06/28/19      PT SHORT TERM GOAL #2   Title  Pt volume in thigh to have decreased 2-3 cm; LE 1--2    Time  3    Period  Weeks    Status  On-going      PT SHORT TERM GOAL #3   Title  PT to be completing LE exercises at least daily to increase lymphatic circulation.    Time  3    Period  Weeks    Status  Achieved        PT Long Term Goals - 06/10/19  [place] #1   Title  PT to have lost 4-6  cm of volume from her thighs  ;2-4 from her LE    Time  6    Period  Weeks    Status  On-going      PT LONG TERM GOAL #2   Title  Pt to no longer have erythema in her LE    Time  6    Period  Weeks    Status  On-going      PT LONG TERM GOAL #3   Title  PT to have new compression garments that provide 20-3-mmHG and to be wearing on a daily basis    Time  6    Period  Weeks    Status  On-going      PT LONG TERM GOAL #4   Title  PT to have obtained the biocompression sleeve for her pump , ( pt is going to try and see who she recieved the pump from).    Status  On-going            Plan - 06/30/19 1729    Clinical Impression Statement  Pt conitnues to make improvements.  Continued with manual lymph drainage f/b full bandagiing to bilateral LE's.  PT received communication regarding pump that the company is waiting on insurance verification.    Personal Factors and Comorbidities  Fitness;Past/Current Experience;Time since onset of injury/illness/exacerbation    Comorbidities  HTN, frequent falls, Chronic lower back, ( has been having injections for years), and bilateral knee pain, Stasis dermatitis    Examination-Activity Limitations   Bathing;Caring for Others;Dressing;Hygiene/Grooming;Locomotion Level;Squat;Stairs;Stand    Examination-Participation Restrictions  Cleaning;Community Activity;Yard Work    [employer] officer  Evolving/Moderate complexity    Rehab Potential  Good    PT Frequency  3x / week    PT Duration  6 weeks    PT Treatment/Interventions  ADLs/Self Care Home Management;Manual techniques;Manual lymph drainage;Compression bandaging;Patient/family education;Therapeutic exercise;Other (comment)   wound care to include debridement   PT Next Visit Plan  Continue with total decongestive techniques measure for volume each week.  Review self manual techniqes.    PT Home Exercise Plan  LE exercises and diaphragmic breathing       Patient will benefit from skilled therapeutic intervention in order to improve the following deficits and impairments:  Abnormal gait, Decreased activity tolerance, Decreased range of motion, Difficulty walking, Increased edema, Pain, Decreased skin integrity  Visit Diagnosis: Lymphedema, not elsewhere classified     Problem List Patient Active Problem List   Diagnosis Date Noted  . Stasis dermatitis of both legs 12/24/2018  . Leg pain, bilateral 12/24/2018  . Degenerative disc disease, lumbar 12/24/2018  . Fibromyalgia 12/24/2018  . Vitamin D deficiency 12/24/2018  . Class 3 severe obesity with serious comorbidity and body mass index (BMI) of 40.0 to 44.9 in adult ([place]) 07/23/2017  . Closed fracture of right inferior pubic ramus ([place]) 11/18/2016  . Gastroesophageal reflux disease 06/06/2016  . Recurrent major depressive disorder, in full remission ([place]) 06/06/2016  . DM type 2 with diabetic dyslipidemia ([place]) 03/06/2016  . Edema 09/14/2013  . Precordial pain 08/20/2011  . Essential hypertension, benign   . Hyperlipidemia    Teena Irani, PTA/CLT [phone]  Teena Irani 06/30/2019, 5:30 PM  [facility] [address] [place], [place], [address] Phone: [phone]   Fax:  [phone]  Name: Tiffany  HENDY Brown MRN: [id number] Date of Birth: 07-Nov-1945

## 2019-07-01 ENCOUNTER — Ambulatory Visit: Payer: Medicare Other | Admitting: Family Medicine

## 2019-07-01 DIAGNOSIS — M1712 Unilateral primary osteoarthritis, left knee: Secondary | ICD-10-CM | POA: Diagnosis not present

## 2019-07-01 DIAGNOSIS — M5137 Other intervertebral disc degeneration, lumbosacral region: Secondary | ICD-10-CM | POA: Diagnosis not present

## 2019-07-01 DIAGNOSIS — G894 Chronic pain syndrome: Secondary | ICD-10-CM | POA: Diagnosis not present

## 2019-07-01 DIAGNOSIS — M47817 Spondylosis without myelopathy or radiculopathy, lumbosacral region: Secondary | ICD-10-CM | POA: Diagnosis not present

## 2019-07-01 LAB — CMP14+EGFR
ALT: 19 IU/L (ref 0–32)
AST: 18 IU/L (ref 0–40)
Albumin/Globulin Ratio: 1.8 (ref 1.2–2.2)
Albumin: 4.2 g/dL (ref 3.7–4.7)
Alkaline Phosphatase: 104 IU/L (ref 39–117)
BUN/Creatinine Ratio: 20 (ref 12–28)
BUN: 17 mg/dL (ref 8–27)
Bilirubin Total: 0.3 mg/dL (ref 0.0–1.2)
CO2: 27 mmol/L (ref 20–29)
Calcium: 9.6 mg/dL (ref 8.7–10.3)
Chloride: 101 mmol/L (ref 96–106)
Creatinine, Ser: 0.84 mg/dL (ref 0.57–1.00)
GFR calc Af Amer: 79 mL/min/{1.73_m2} (ref >59)
GFR calc non Af Amer: 69 mL/min/{1.73_m2} (ref >59)
Globulin, Total: 2.3 g/dL (ref 1.5–4.5)
Glucose: 100 mg/dL — ABNORMAL HIGH (ref 65–99)
Potassium: 4 mmol/L (ref 3.5–5.2)
Sodium: 142 mmol/L (ref 134–144)
Total Protein: 6.5 g/dL (ref 6.0–8.5)

## 2019-07-02 ENCOUNTER — Encounter (HOSPITAL_COMMUNITY): Payer: Medicare Other

## 2019-07-02 ENCOUNTER — Other Ambulatory Visit: Payer: Self-pay

## 2019-07-02 ENCOUNTER — Ambulatory Visit (HOSPITAL_COMMUNITY): Payer: Medicare Other | Admitting: Physical Therapy

## 2019-07-02 DIAGNOSIS — I890 Lymphedema, not elsewhere classified: Secondary | ICD-10-CM

## 2019-07-02 DIAGNOSIS — S81801A Unspecified open wound, right lower leg, initial encounter: Secondary | ICD-10-CM | POA: Diagnosis not present

## 2019-07-02 DIAGNOSIS — M6281 Muscle weakness (generalized): Secondary | ICD-10-CM | POA: Diagnosis not present

## 2019-07-02 NOTE — Therapy (Addendum)
[facility] [address] [place], [place], [address] Phone: [phone]   Fax:  [phone]  Physical Therapy Treatment  Patient Details  Name: Tiffany Brown MRN: [id number] Date of Birth: 12-Jun-1945 Referring Provider (PT): Carloyn Jaeger   Encounter Date: 07/02/2019  PT End of Session - 07/02/19 1235    Visit Number  12    Number of Visits  18    Date for PT Re-Evaluation  07/19/19    Authorization Type  [employer]    Authorization - Visit Number  2    Authorization - Number of Visits  10    Progress Note Due on Visit  20    PT Start Time  0830    PT Stop Time  1000    PT Time Calculation (min)  90 min    Activity Tolerance  Patient tolerated treatment well    Behavior During Therapy  [facility] for tasks assessed/performed       Past Medical History:  Diagnosis Date  . Abnormal glucose   . Depression with anxiety   . Essential hypertension, benign   . Fibromyalgia   . Melanoma ([place])   . Near syncope   . Other and unspecified hyperlipidemia     Past Surgical History:  Procedure Laterality Date  . ABDOMINAL HYSTERECTOMY    . BREAST SURGERY     LEFT  . CHOLECYSTECTOMY    . ELBOW SURGERY     X's 3 Left  . RIGHT DISTAL RADIUS FRACTURE    . TOTAL KNEE ARTHROPLASTY     RIGHT    There were no vitals filed for this visit.  Subjective Assessment - 07/02/19 1232    Subjective  PT states that she called [facility] and they are waiting on her insurance before they come measure her for her pump.    Pertinent History  HTN, frequent falls, Chronic lower back, ( has been having injections for years),  and bilateral knee pain, Stasis dermatitis    Limitations  Walking;Standing;House hold activities    How long can you sit comfortably?  no problem    How long can you stand comfortably?  10 -15 minutes    How long can you walk comfortably?  walks with a rollator 10-15  minutes    Patient Stated Goals  improve balance, feel more confident  with walking    Pain Onset  More than a month ago             [facility] Adult PT Treatment/Exercise - 07/02/19 0001      Manual Therapy   Manual Therapy  Manual Lymphatic Drainage (MLD);Compression Bandaging    Manual therapy comments  completed seperate from all other aspects of treatment.     Manual Lymphatic Drainage (MLD)  for bilateral LE's including short neck, deep and superficial abdominal and lumbar nodes, inguinal-axillary anastomosis bilaterally supine and prone.    Compression Bandaging  1/2" foam and multilayer short stretch bandaging toe to thigh B               PT Short Term Goals - 06/10/19 1055      PT SHORT TERM GOAL #1   Title  Pt wound on anterior aspect of Rt LE to be healed    Time  3    Period  Weeks    Status  Achieved    Target Date  06/28/19      PT SHORT TERM GOAL #2   Title  Pt volume in thigh to have decreased 2-3 cm; LE 1--2    Time  3    Period  Weeks    Status  On-going      PT SHORT TERM GOAL #3   Title  PT to be completing LE exercises at least daily to increase lymphatic circulation.    Time  3    Period  Weeks    Status  Achieved        PT Long Term Goals - 06/10/19 1056      PT LONG TERM GOAL #1   Title  PT to have lost 4-6  cm of volume from her thighs  ;2-4 from her LE    Time  6    Period  Weeks    Status  On-going      PT LONG TERM GOAL #2   Title  Pt to no longer have erythema in her LE    Time  6    Period  Weeks    Status  On-going      PT LONG TERM GOAL #3   Title  PT to have new compression garments that provide 20-3-mmHG and to be wearing on a daily basis    Time  6    Period  Weeks    Status  On-going      PT LONG TERM GOAL #4   Title  PT to have obtained the biocompression sleeve for her pump , ( pt is going to try and see who she recieved the pump from).    Status  On-going            Plan - 07/02/19 1235    Clinical Impression Statement  Pt continues to have increased congestion in  abdominal and upper thigh area B.  Extra manual was sent in these areas.  Pt has acquired and donned her new compression pantyhose.    Personal Factors and Comorbidities  Fitness;Past/Current Experience;Time since onset of injury/illness/exacerbation    Comorbidities  HTN, frequent falls, Chronic lower back, ( has been having injections for years), and bilateral knee pain, Stasis dermatitis    Examination-Activity Limitations  Bathing;Caring for Others;Dressing;Hygiene/Grooming;Locomotion Level;Squat;Stairs;Stand    Examination-Participation Restrictions  Cleaning;Community Activity;Yard Work    [employer] officer  Evolving/Moderate complexity    Rehab Potential  Good    PT Frequency  3x / week    PT Duration  6 weeks    PT Treatment/Interventions  ADLs/Self Care Home Management;Manual techniques;Manual lymph drainage;Compression bandaging;Patient/family education;Therapeutic exercise;Other (comment)   wound care to include debridement   PT Next Visit Plan  Continue with total decongestive techniques measure for volume each week.  Review self manual techniqes. Measure on Wed.    PT Home Exercise Plan  LE exercises and diaphragmic breathing       Patient will benefit from skilled therapeutic intervention in order to improve the following deficits and impairments:  Abnormal gait, Decreased activity tolerance, Decreased range of motion, Difficulty walking, Increased edema, Pain, Decreased skin integrity  Visit Diagnosis: Lymphedema, not elsewhere classified     Problem List Patient Active Problem List   Diagnosis Date Noted  . Stasis dermatitis of both legs 12/24/2018  . Leg pain, bilateral 12/24/2018  . Degenerative disc disease, lumbar 12/24/2018  . Fibromyalgia 12/24/2018  . Vitamin D deficiency 12/24/2018  . Class 3 severe obesity with serious comorbidity and body mass index (BMI) of 40.0 to 44.9 in adult ([place]) 07/23/2017  . Closed fracture of right inferior  pubic  ramus ([place]) 11/18/2016  . Gastroesophageal reflux disease 06/06/2016  . Recurrent major depressive disorder, in full remission ([place]) 06/06/2016  . DM type 2 with diabetic dyslipidemia ([place]) 03/06/2016  . Edema 09/14/2013  . Precordial pain 08/20/2011  . Essential hypertension, benign   . Hyperlipidemia    Rayetta Humphrey, [place] CLT [phone] 07/02/2019, 12:38 PM  [facility] [address] [place], [place], [address] Phone: [phone]   Fax:  [phone]  Name: Tiffany Brown MRN: [id number] Date of Birth: [birth year]

## 2019-07-05 ENCOUNTER — Other Ambulatory Visit: Payer: Self-pay

## 2019-07-05 ENCOUNTER — Ambulatory Visit (HOSPITAL_COMMUNITY): Payer: Medicare Other | Attending: Specialist | Admitting: Physical Therapy

## 2019-07-05 ENCOUNTER — Telehealth (HOSPITAL_COMMUNITY): Payer: Self-pay | Admitting: Physical Therapy

## 2019-07-05 ENCOUNTER — Encounter (HOSPITAL_COMMUNITY): Payer: Self-pay | Admitting: Physical Therapy

## 2019-07-05 DIAGNOSIS — R296 Repeated falls: Secondary | ICD-10-CM | POA: Insufficient documentation

## 2019-07-05 DIAGNOSIS — M6281 Muscle weakness (generalized): Secondary | ICD-10-CM | POA: Diagnosis not present

## 2019-07-05 DIAGNOSIS — R2681 Unsteadiness on feet: Secondary | ICD-10-CM | POA: Insufficient documentation

## 2019-07-05 DIAGNOSIS — I890 Lymphedema, not elsewhere classified: Secondary | ICD-10-CM

## 2019-07-05 DIAGNOSIS — S81801A Unspecified open wound, right lower leg, initial encounter: Secondary | ICD-10-CM | POA: Diagnosis not present

## 2019-07-05 NOTE — Telephone Encounter (Signed)
Called pt to see if she could come 45 minutes earlier so that she could have a 90 minute treatment.   Rayetta Humphrey, [place] CLT [phone]

## 2019-07-05 NOTE — Therapy (Addendum)
[facility] [address] [place], [place], [address] Phone: [phone]   Fax:  [phone]  Physical Therapy Treatment  Patient Details  Name: Tiffany Brown MRN: [id number] Date of Birth: 1946-02-15 Referring Provider (PT): Carloyn Jaeger   Encounter Date: 07/05/2019  PT End of Session - 07/05/19 1227    Visit Number  13    Number of Visits  18    Date for PT Re-Evaluation  07/19/19    Authorization Type  [employer]    Authorization - Visit Number  3    Authorization - Number of Visits  10    Progress Note Due on Visit  20    PT Start Time  [id number]    PT Stop Time  1215    PT Time Calculation (min)  100 min    Activity Tolerance  Patient tolerated treatment well    Behavior During Therapy  [facility] for tasks assessed/performed       Past Medical History:  Diagnosis Date  . Abnormal glucose   . Depression with anxiety   . Essential hypertension, benign   . Fibromyalgia   . Melanoma ([place])   . Near syncope   . Other and unspecified hyperlipidemia     Past Surgical History:  Procedure Laterality Date  . ABDOMINAL HYSTERECTOMY    . BREAST SURGERY     LEFT  . CHOLECYSTECTOMY    . ELBOW SURGERY     X's 3 Left  . RIGHT DISTAL RADIUS FRACTURE    . TOTAL KNEE ARTHROPLASTY     RIGHT    There were no vitals filed for this visit.  Subjective Assessment - 07/05/19 1226    Subjective  Pt still has not heard from [facility] about the pump she will call them today.  Pt states that the area on her Lt foot,(dorsal aspect) does not hurt or itch.    Pertinent History  HTN, frequent falls, Chronic lower back, ( has been having injections for years),  and bilateral knee pain, Stasis dermatitis    Limitations  Walking;Standing;House hold activities    How long can you sit comfortably?  no problem    How long can you stand comfortably?  10 -15 minutes    How long can you walk comfortably?  walks with a rollator 10-15  minutes    Patient Stated  Goals  improve balance, feel more confident with walking    Pain Onset  More than a month ago                [facility] Adult PT Treatment/Exercise - 07/05/19 0001      Manual Therapy   Manual Therapy  Manual Lymphatic Drainage (MLD);Compression Bandaging    Manual therapy comments  completed seperate from all other aspects of treatment.     Manual Lymphatic Drainage (MLD)  for bilateral LE's including short neck, deep and superficial abdominal and lumbar nodes, inguinal-axillary anastomosis bilaterally supine and prone.     Compression Bandaging  1/2" foam and multilayer short stretch bandaging toe to thigh B               PT Short Term Goals - 06/10/19 1055      PT SHORT TERM GOAL #1   Title  Pt wound on anterior aspect of Rt LE to be healed    Time  3    Period  Weeks    Status  Achieved    Target Date  06/28/19  PT SHORT TERM GOAL #2   Title  Pt volume in thigh to have decreased 2-3 cm; LE 1--2    Time  3    Period  Weeks    Status  On-going      PT SHORT TERM GOAL #3   Title  PT to be completing LE exercises at least daily to increase lymphatic circulation.    Time  3    Period  Weeks    Status  Achieved        PT Long Term Goals - 06/10/19 1056      PT LONG TERM GOAL #1   Title  PT to have lost 4-6  cm of volume from her thighs  ;2-4 from her LE    Time  6    Period  Weeks    Status  On-going      PT LONG TERM GOAL #2   Title  Pt to no longer have erythema in her LE    Time  6    Period  Weeks    Status  On-going      PT LONG TERM GOAL #3   Title  PT to have new compression garments that provide 20-3-mmHG and to be wearing on a daily basis    Time  6    Period  Weeks    Status  On-going      PT LONG TERM GOAL #4   Title  PT to have obtained the biocompression sleeve for her pump , ( pt is going to try and see who she recieved the pump from).    Status  On-going            Plan - 07/05/19 1228    Clinical Impression Statement   Pt voices pantyhose is very difficult to don but she manages.  Due to abdominal edema pt will benefit more from compression pump to include abdominal area  rather than LE only.   Pt has a line of redness approximately 3" long on the medial aspect of the dorsal foot.  Pt states it looks like when she was told that she had a bacterial infection.  Pt states that she has cream at home.  Therapist put medihoney on this area prior to bandaging and request pt to bring cream she has at home to next visit.    Personal Factors and Comorbidities  Fitness;Past/Current Experience;Time since onset of injury/illness/exacerbation    Comorbidities  HTN, frequent falls, Chronic lower back, ( has been having injections for years), and bilateral knee pain, Stasis dermatitis    Examination-Activity Limitations  Bathing;Caring for Others;Dressing;Hygiene/Grooming;Locomotion Level;Squat;Stairs;Stand    Examination-Participation Restrictions  Cleaning;Community Activity;Yard Work    [employer] officer  Evolving/Moderate complexity    Rehab Potential  Good    PT Frequency  3x / week    PT Duration  6 weeks    PT Treatment/Interventions  ADLs/Self Care Home Management;Manual techniques;Manual lymph drainage;Compression bandaging;Patient/family education;Therapeutic exercise;Other (comment)   wound care to include debridement   PT Next Visit Plan  Continue with total decongestive techniques measure for volume each week.  Measure on Wed.  Check with pt to see if she wants [place] to order capri and knee high garment rather than using pantyhose compression.    PT Home Exercise Plan  LE exercises and diaphragmic breathing       Patient will benefit from skilled therapeutic intervention in order to improve the following deficits and impairments:  Abnormal gait, Decreased activity  tolerance, Decreased range of motion, Difficulty walking, Increased edema, Pain, Decreased skin integrity  Visit Diagnosis: Lymphedema, not  elsewhere classified     Problem List Patient Active Problem List   Diagnosis Date Noted  . Stasis dermatitis of both legs 12/24/2018  . Leg pain, bilateral 12/24/2018  . Degenerative disc disease, lumbar 12/24/2018  . Fibromyalgia 12/24/2018  . Vitamin D deficiency 12/24/2018  . Class 3 severe obesity with serious comorbidity and body mass index (BMI) of 40.0 to 44.9 in adult ([place]) 07/23/2017  . Closed fracture of right inferior pubic ramus ([place]) 11/18/2016  . Gastroesophageal reflux disease 06/06/2016  . Recurrent major depressive disorder, in full remission ([place]) 06/06/2016  . DM type 2 with diabetic dyslipidemia ([place]) 03/06/2016  . Edema 09/14/2013  . Precordial pain 08/20/2011  . Essential hypertension, benign   . Hyperlipidemia     Rayetta Humphrey, [place] CLT [phone] 07/05/2019, 12:32 PM  [facility] [address] [place], [place], [address] Phone: [phone]   Fax:  [phone]  Name: CYANI SPACE MRN: [id number] Date of Birth: 03/02/1946

## 2019-07-06 DIAGNOSIS — R194 Change in bowel habit: Secondary | ICD-10-CM | POA: Diagnosis not present

## 2019-07-06 DIAGNOSIS — R110 Nausea: Secondary | ICD-10-CM | POA: Diagnosis not present

## 2019-07-06 DIAGNOSIS — R109 Unspecified abdominal pain: Secondary | ICD-10-CM | POA: Diagnosis not present

## 2019-07-07 ENCOUNTER — Other Ambulatory Visit: Payer: Self-pay

## 2019-07-07 ENCOUNTER — Ambulatory Visit (HOSPITAL_COMMUNITY): Payer: Medicare Other | Admitting: Physical Therapy

## 2019-07-07 DIAGNOSIS — S81801A Unspecified open wound, right lower leg, initial encounter: Secondary | ICD-10-CM | POA: Diagnosis not present

## 2019-07-07 DIAGNOSIS — I890 Lymphedema, not elsewhere classified: Secondary | ICD-10-CM

## 2019-07-07 DIAGNOSIS — R296 Repeated falls: Secondary | ICD-10-CM | POA: Diagnosis not present

## 2019-07-07 DIAGNOSIS — R2681 Unsteadiness on feet: Secondary | ICD-10-CM

## 2019-07-07 DIAGNOSIS — M6281 Muscle weakness (generalized): Secondary | ICD-10-CM | POA: Diagnosis not present

## 2019-07-07 NOTE — Therapy (Signed)
[facility] [address] [place], [place], [address] Phone: [phone]   Fax:  [phone]  Physical Therapy Treatment  Patient Details  Name: Tiffany Brown MRN: [id number] Date of Birth: 09/21/1945 Referring Provider (PT): Carloyn Jaeger   Encounter Date: 07/07/2019  PT End of Session - 07/07/19 1434    Visit Number  14    Number of Visits  18    Date for PT Re-Evaluation  07/19/19    Authorization Type  [employer]    Authorization - Visit Number  4    Authorization - Number of Visits  10    Progress Note Due on Visit  20    PT Start Time  0830    PT Stop Time  1005    PT Time Calculation (min)  95 min    Activity Tolerance  Patient tolerated treatment well    Behavior During Therapy  [facility] for tasks assessed/performed       Past Medical History:  Diagnosis Date  . Abnormal glucose   . Depression with anxiety   . Essential hypertension, benign   . Fibromyalgia   . Melanoma ([place])   . Near syncope   . Other and unspecified hyperlipidemia     Past Surgical History:  Procedure Laterality Date  . ABDOMINAL HYSTERECTOMY    . BREAST SURGERY     LEFT  . CHOLECYSTECTOMY    . ELBOW SURGERY     X's 3 Left  . RIGHT DISTAL RADIUS FRACTURE    . TOTAL KNEE ARTHROPLASTY     RIGHT    There were no vitals filed for this visit.  Subjective Assessment - 07/07/19 1429    Subjective  pt states she is doing well. sTates she forgot her bacterial cream for her Lt foot/ankle.  STates it does not itch or hurt.            LYMPHEDEMA/ONCOLOGY QUESTIONNAIRE - 07/07/19 1443      What other symptoms do you have   Are you Having Heaviness or Tightness  Yes    Are you having Pain  Yes    Are you having pitting edema  Yes    Body Site  LE    Is it Hard or Difficult finding clothes that fit  Yes    Do you have infections  No    Stemmer Sign  Yes      Lymphedema Stage   Stage  STAGE 2 SPONTANEOUSLY IRREVERSIBLE      Lymphedema  Assessments   Lymphedema Assessments  Lower extremities      Right Lower Extremity Lymphedema   20 cm Proximal to Suprapatella  69 cm   was 74.5 cm at initial eval 2/01   10 cm Proximal to Suprapatella  55 cm   was 63.9 cm   At Midpatella/Popliteal Crease  46 cm   was 50 cm   30 cm Proximal to Floor at Lateral Plantar Foot  51 cm   was 44 cm   20 cm Proximal to Floor at Lateral Plantar Foot  33 1   was 34.2 cm   10 cm Proximal to Floor at Lateral Malleoli  24 cm   was 22.4 cm   Circumference of ankle/heel  32.5 cm.   wa 34.7 cm   5 cm Proximal to 1st MTP Joint  24 cm   was 24.6 cm   Across MTP Joint  23.5 cm   was 24.9 cm  Around Proximal Great Toe  8.2 cm   was 9 cm     Left Lower Extremity Lymphedema   20 cm Proximal to Suprapatella  70 cm   was 72 cm at initial evaluaiton on 2/01   10 cm Proximal to Suprapatella  55.7 cm   was 59.5 cm   At Midpatella/Popliteal Crease  43 cm   was 49.5 cm   30 cm Proximal to Floor at Lateral Plantar Foot  40 cm   was 41.3 cm   20 cm Proximal to Floor at Lateral Plantar Foot  30 cm   was 30.8 cm   10 cm Proximal to Floor at Lateral Malleoli  23 cm   was 24 cm   Circumference of ankle/heel  33 cm.   was 34.5 cm   5 cm Proximal to 1st MTP Joint  24 cm   was 24.5 cm   Across MTP Joint  23.4 cm   was 23.5 cm   Around Proximal Great Toe  8.4 cm   was 9.5 cm                         PT Short Term Goals - 06/10/19 1055      PT SHORT TERM GOAL #1   Title  Pt wound on anterior aspect of Rt LE to be healed    Time  3    Period  Weeks    Status  Achieved    Target Date  06/28/19      PT SHORT TERM GOAL #2   Title  Pt volume in thigh to have decreased 2-3 cm; LE 1--2    Time  3    Period  Weeks    Status  On-going      PT SHORT TERM GOAL #3   Title  PT to be completing LE exercises at least daily to increase lymphatic circulation.    Time  3    Period  Weeks    Status  Achieved        PT Long Term Goals  - 06/10/19 1056      PT LONG TERM GOAL #1   Title  PT to have lost 4-6  cm of volume from her thighs  ;2-4 from her LE    Time  6    Period  Weeks    Status  On-going      PT LONG TERM GOAL #2   Title  Pt to no longer have erythema in her LE    Time  6    Period  Weeks    Status  On-going      PT LONG TERM GOAL #3   Title  PT to have new compression garments that provide 20-3-mmHG and to be wearing on a daily basis    Time  6    Period  Weeks    Status  On-going      PT LONG TERM GOAL #4   Title  PT to have obtained the biocompression sleeve for her pump , ( pt is going to try and see who she recieved the pump from).    Status  On-going            Plan - 07/07/19 1435    Clinical Impression Statement  bilateral LE's remeasured with noted reduction as compared to last week.  Continued with complete decongestive therapy for bilateral LE's.  Rash remains the same on Lt foot and  patient reminded to bring her ointment next visit ot apply to area.  No open or draining areas noted so only moisturized area well prior to redressing.    Personal Factors and Comorbidities  Fitness;Past/Current Experience;Time since onset of injury/illness/exacerbation    Comorbidities  HTN, frequent falls, Chronic lower back, ( has been having injections for years), and bilateral knee pain, Stasis dermatitis    Examination-Activity Limitations  Bathing;Caring for Others;Dressing;Hygiene/Grooming;Locomotion Level;Squat;Stairs;Stand    Examination-Participation Restrictions  Cleaning;Community Activity;Yard Work    [employer] officer  Evolving/Moderate complexity    Rehab Potential  Good    PT Frequency  3x / week    PT Duration  6 weeks    PT Treatment/Interventions  ADLs/Self Care Home Management;Manual techniques;Manual lymph drainage;Compression bandaging;Patient/family education;Therapeutic exercise;Other (comment)   wound care to include debridement   PT Next Visit Plan  Continue  with total decongestive techniques measure for volume each week.  Measure on Wed.  Check with pt to see if she wants [place] to order capri and knee high garment from [facility].    PT Home Exercise Plan  LE exercises and diaphragmic breathing       Patient will benefit from skilled therapeutic intervention in order to improve the following deficits and impairments:  Abnormal gait, Decreased activity tolerance, Decreased range of motion, Difficulty walking, Increased edema, Pain, Decreased skin integrity  Visit Diagnosis: Muscle weakness (generalized)  Unsteadiness on feet  Leg wound, right, initial encounter  Lymphedema, not elsewhere classified     Problem List Patient Active Problem List   Diagnosis Date Noted  . Stasis dermatitis of both legs 12/24/2018  . Leg pain, bilateral 12/24/2018  . Degenerative disc disease, lumbar 12/24/2018  . Fibromyalgia 12/24/2018  . Vitamin D deficiency 12/24/2018  . Class 3 severe obesity with serious comorbidity and body mass index (BMI) of 40.0 to 44.9 in adult ([place]) 07/23/2017  . Closed fracture of right inferior pubic ramus ([place]) 11/18/2016  . Gastroesophageal reflux disease 06/06/2016  . Recurrent major depressive disorder, in full remission ([place]) 06/06/2016  . DM type 2 with diabetic dyslipidemia ([place]) 03/06/2016  . Edema 09/14/2013  . Precordial pain 08/20/2011  . Essential hypertension, benign   . Hyperlipidemia    Teena Irani, PTA/CLT [phone]  Teena Irani 07/07/2019, 2:45 PM  [facility] [address] [place], [place], [address] Phone: [phone]   Fax:  [phone]  Name: Tiffany Brown MRN: [id number] Date of Birth: 07-06-1945

## 2019-07-09 ENCOUNTER — Other Ambulatory Visit: Payer: Self-pay

## 2019-07-09 ENCOUNTER — Ambulatory Visit (HOSPITAL_COMMUNITY): Payer: Medicare Other | Admitting: Physical Therapy

## 2019-07-09 ENCOUNTER — Encounter (HOSPITAL_COMMUNITY): Payer: Self-pay | Admitting: Physical Therapy

## 2019-07-09 DIAGNOSIS — R2681 Unsteadiness on feet: Secondary | ICD-10-CM | POA: Diagnosis not present

## 2019-07-09 DIAGNOSIS — I890 Lymphedema, not elsewhere classified: Secondary | ICD-10-CM

## 2019-07-09 DIAGNOSIS — R296 Repeated falls: Secondary | ICD-10-CM | POA: Diagnosis not present

## 2019-07-09 DIAGNOSIS — S81801A Unspecified open wound, right lower leg, initial encounter: Secondary | ICD-10-CM | POA: Diagnosis not present

## 2019-07-09 DIAGNOSIS — M6281 Muscle weakness (generalized): Secondary | ICD-10-CM | POA: Diagnosis not present

## 2019-07-09 NOTE — Therapy (Signed)
[facility] [address] [place], [place], [address] Phone: [phone]   Fax:  [phone]  Physical Therapy Treatment  Patient Details  Name: Tiffany Brown MRN: [id number] Date of Birth: 12/20/1945 Referring Provider (PT): Carloyn Jaeger   Encounter Date: 07/09/2019  PT End of Session - 07/09/19 1150    Visit Number  15    Number of Visits  18    Date for PT Re-Evaluation  07/19/19    Authorization Type  [employer] M[employer]    Authorization - Visit Number  5    Authorization - Number of Visits  10    Progress Note Due on Visit  20    PT Start Time  0910    PT Stop Time  1055    PT Time Calculation (min)  105 min    Activity Tolerance  Patient tolerated treatment well    Behavior During Therapy  [facility] for tasks assessed/performed       Past Medical History:  Diagnosis Date  . Abnormal glucose   . Depression with anxiety   . Essential hypertension, benign   . Fibromyalgia   . Melanoma ([place])   . Near syncope   . Other and unspecified hyperlipidemia     Past Surgical History:  Procedure Laterality Date  . ABDOMINAL HYSTERECTOMY    . BREAST SURGERY     LEFT  . CHOLECYSTECTOMY    . ELBOW SURGERY     X's 3 Left  . RIGHT DISTAL RADIUS FRACTURE    . TOTAL KNEE ARTHROPLASTY     RIGHT    There were no vitals filed for this visit.  Subjective Assessment - 07/09/19 1107    Subjective  Pt states Jason from [facility] was to call the department and speak to the therapist at 8:00 this morning, no call has been recieved.  PT states that it has been a long time since she has had pain in her legs.  Pt states that she brought the mupirocin to put on her foot.    Pertinent History  HTN, frequent falls, Chronic lower back, ( has been having injections for years),  and bilateral knee pain, Stasis dermatitis    Limitations  Walking;Standing;House hold activities    How long can you sit comfortably?  no problem    How long can you stand comfortably?   10 -15 minutes    How long can you walk comfortably?  walks with a rollator 10-15  minutes    Patient Stated Goals  improve balance, feel more confident with walking    Currently in Pain?  No/denies    Pain Onset  More than a month ago                       [facility] Adult PT Treatment/Exercise - 07/09/19 0001      Manual Therapy   Manual Therapy  Manual Lymphatic Drainage (MLD);Compression Bandaging    Manual therapy comments  completed seperate from all other aspects of treatment.     Manual Lymphatic Drainage (MLD)  for bilateral LE's including short neck, deep and superficial abdominal and lumbar nodes, inguinal-axillary anastomosis bilaterally  LE completed both supine and pront.    Compression Bandaging  1/2" foam and multilayer short stretch bandaging toe to thigh B               PT Short Term Goals - 07/09/19 1156      PT SHORT TERM  GOAL #1   Title  Pt wound on anterior aspect of Rt LE to be healed    Time  3    Period  Weeks    Status  Achieved    Target Date  06/28/19      PT SHORT TERM GOAL #2   Title  Pt volume in thigh to have decreased 2-3 cm; LE 1--2    Time  3    Period  Weeks    Status  Achieved      PT SHORT TERM GOAL #3   Title  PT to be completing LE exercises at least daily to increase lymphatic circulation.    Time  3    Period  Weeks    Status  Achieved        PT Long Term Goals - 06/10/19 1056      PT LONG TERM GOAL #1   Title  PT to have lost 4-6  cm of volume from her thighs  ;2-4 from her LE    Time  6    Period  Weeks    Status  On-going      PT LONG TERM GOAL #2   Title  Pt to no longer have erythema in her LE    Time  6    Period  Weeks    Status  On-going      PT LONG TERM GOAL #3   Title  PT to have new compression garments that provide 20-3-mmHG and to be wearing on a daily basis    Time  6    Period  Weeks    Status  On-going      PT LONG TERM GOAL #4   Title  PT to have obtained the biocompression  sleeve for her pump , ( pt is going to try and see who she recieved the pump from).    Status  On-going            Plan - 07/09/19 1151    Clinical Impression Statement  Treatment was performed by Ihor Austin PTA, CLT while therapist observed treatment.  Therapist called Corene Cornea from [employer] compression as he had not called the departement.  Needed waist, thigh and calf measurments which were given to pt.  Pt brought mupirocin in to put on foot.  Therapist notes a red ring on the medial thigh of pt Rt thight and one on the anterior aspect of her Lt thigh.  Therapist inquired whether or not area could be ring worm, pt does not believe so.  We will use mupirocin next visit but if area does not improve patient may want to try an antifungal cream or go to MD for a definate diagnosis.  Pt will be ready for discharge as soon as she recieves her pump.  Pt states that she is happy with her pantyhose and does not want to explore the Capri/knee high option.    Personal Factors and Comorbidities  Fitness;Past/Current Experience;Time since onset of injury/illness/exacerbation    Comorbidities  HTN, frequent falls, Chronic lower back, ( has been having injections for years), and bilateral knee pain, Stasis dermatitis    Examination-Activity Limitations  Bathing;Caring for Others;Dressing;Hygiene/Grooming;Locomotion Level;Squat;Stairs;Stand    Examination-Participation Restrictions  Cleaning;Community Activity;Yard Work    [employer] officer  Evolving/Moderate complexity    Rehab Potential  Good    PT Frequency  3x / week    PT Duration  6 weeks    PT Treatment/Interventions  ADLs/Self Care Home  Management;Manual techniques;Manual lymph drainage;Compression bandaging;Patient/family education;Therapeutic exercise;Other (comment)   wound care to include debridement   PT Next Visit Plan  Continue with total decongestive techniques measure for volume each week.  Measure on Wed.    PT Home  Exercise Plan  LE exercises and diaphragmic breathing       Patient will benefit from skilled therapeutic intervention in order to improve the following deficits and impairments:  Abnormal gait, Decreased activity tolerance, Decreased range of motion, Difficulty walking, Increased edema, Pain, Decreased skin integrity  Visit Diagnosis: Lymphedema, not elsewhere classified     Problem List Patient Active Problem List   Diagnosis Date Noted  . Stasis dermatitis of both legs 12/24/2018  . Leg pain, bilateral 12/24/2018  . Degenerative disc disease, lumbar 12/24/2018  . Fibromyalgia 12/24/2018  . Vitamin D deficiency 12/24/2018  . Class 3 severe obesity with serious comorbidity and body mass index (BMI) of 40.0 to 44.9 in adult ([place]) 07/23/2017  . Closed fracture of right inferior pubic ramus ([place]) 11/18/2016  . Gastroesophageal reflux disease 06/06/2016  . Recurrent major depressive disorder, in full remission ([place]) 06/06/2016  . DM type 2 with diabetic dyslipidemia ([place]) 03/06/2016  . Edema 09/14/2013  . Precordial pain 08/20/2011  . Essential hypertension, benign   . Hyperlipidemia    Rayetta Humphrey, [place] CLT [phone] 07/09/2019, 11:58 AM  [facility] [address] [place], [place], [address] Phone: [phone]   Fax:  [phone]  Name: GLENNIE ELLEY MRN: [id number] Date of Birth: [birth year]

## 2019-07-12 ENCOUNTER — Other Ambulatory Visit: Payer: Self-pay

## 2019-07-12 ENCOUNTER — Ambulatory Visit (HOSPITAL_COMMUNITY): Payer: Medicare Other | Admitting: Physical Therapy

## 2019-07-12 DIAGNOSIS — I890 Lymphedema, not elsewhere classified: Secondary | ICD-10-CM | POA: Diagnosis not present

## 2019-07-12 DIAGNOSIS — M6281 Muscle weakness (generalized): Secondary | ICD-10-CM | POA: Diagnosis not present

## 2019-07-12 DIAGNOSIS — R2681 Unsteadiness on feet: Secondary | ICD-10-CM | POA: Diagnosis not present

## 2019-07-12 DIAGNOSIS — R296 Repeated falls: Secondary | ICD-10-CM | POA: Diagnosis not present

## 2019-07-12 DIAGNOSIS — S81801A Unspecified open wound, right lower leg, initial encounter: Secondary | ICD-10-CM | POA: Diagnosis not present

## 2019-07-12 NOTE — Therapy (Signed)
[facility] [address] [place], [place], [address] Phone: [phone]   Fax:  [phone]  Physical Therapy Treatment  Patient Details  Name: Tiffany Brown MRN: [id number] Date of Birth: 15-Oct-1945 Referring Provider (PT): Carloyn Jaeger   Encounter Date: 07/12/2019  PT End of Session - 07/12/19 1724    Visit Number  16    Number of Visits  18    Date for PT Re-Evaluation  07/19/19    Authorization Type  [employer] MEdic[employer]    Authorization - Visit Number  6    Authorization - Number of Visits  10    Progress Note Due on Visit  20    PT Start Time  1050    PT Stop Time  1218    PT Time Calculation (min)  88 min    Activity Tolerance  Patient tolerated treatment well    Behavior During Therapy  [facility] for tasks assessed/performed       Past Medical History:  Diagnosis Date  . Abnormal glucose   . Depression with anxiety   . Essential hypertension, benign   . Fibromyalgia   . Melanoma ([place])   . Near syncope   . Other and unspecified hyperlipidemia     Past Surgical History:  Procedure Laterality Date  . ABDOMINAL HYSTERECTOMY    . BREAST SURGERY     LEFT  . CHOLECYSTECTOMY    . ELBOW SURGERY     X's 3 Left  . RIGHT DISTAL RADIUS FRACTURE    . TOTAL KNEE ARTHROPLASTY     RIGHT    There were no vitals filed for this visit.  Subjective Assessment - 07/12/19 1722    Subjective  pt states her husband is getting some lotimen to try for those spots as they have not changed that much.    States she has not heard anything about her pump.    Currently in Pain?  No/denies                       [facility] Adult PT Treatment/Exercise - 07/12/19 0001      Manual Therapy   Manual Therapy  Manual Lymphatic Drainage (MLD);Compression Bandaging    Manual therapy comments  completed seperate from all other aspects of treatment.     Manual Lymphatic Drainage (MLD)  for bilateral LE's including short neck, deep and superficial  abdominal and lumbar nodes, inguinal-axillary anastomosis bilaterally  LE completed both supine and pront.    Compression Bandaging  1/2" foam and multilayer short stretch bandaging toe to thigh B               PT Short Term Goals - 07/09/19 1156      PT SHORT TERM GOAL #1   Title  Pt wound on anterior aspect of Rt LE to be healed    Time  3    Period  Weeks    Status  Achieved    Target Date  06/28/19      PT SHORT TERM GOAL #2   Title  Pt volume in thigh to have decreased 2-3 cm; LE 1--2    Time  3    Period  Weeks    Status  Achieved      PT SHORT TERM GOAL #3   Title  PT to be completing LE exercises at least daily to increase lymphatic circulation.    Time  3    Period  Weeks  Status  Achieved        PT Long Term Goals - 06/10/19 1056      PT LONG TERM GOAL #1   Title  PT to have lost 4-6  cm of volume from her thighs  ;2-4 from her LE    Time  6    Period  Weeks    Status  On-going      PT LONG TERM GOAL #2   Title  Pt to no longer have erythema in her LE    Time  6    Period  Weeks    Status  On-going      PT LONG TERM GOAL #3   Title  PT to have new compression garments that provide 20-3-mmHG and to be wearing on a daily basis    Time  6    Period  Weeks    Status  On-going      PT LONG TERM GOAL #4   Title  PT to have obtained the biocompression sleeve for her pump , ( pt is going to try and see who she recieved the pump from).    Status  On-going            Plan - 07/12/19 1725    Clinical Impression Statement  continued with manual lymph drainage and short stretch bandaging to full thighs.  Areas do not appear any worse/better, still with rash around Rt ankle (both medially and laterall..redness but not raised) and the red rings on both anterior thighs.  Also noted a red ring today on Lt posterior lower leg that was not mentioned in last weeks note.  contiued with application of mupirocin cream, however will try the lotrimen if no  change at next visit.  pt explained this and verbalized understanding.    Personal Factors and Comorbidities  Fitness;Past/Current Experience;Time since onset of injury/illness/exacerbation    Comorbidities  HTN, frequent falls, Chronic lower back, ( has been having injections for years), and bilateral knee pain, Stasis dermatitis    Examination-Activity Limitations  Bathing;Caring for Others;Dressing;Hygiene/Grooming;Locomotion Level;Squat;Stairs;Stand    Examination-Participation Restrictions  Cleaning;Community Activity;Yard Work    [employer] officer  Evolving/Moderate complexity    Rehab Potential  Good    PT Frequency  3x / week    PT Duration  6 weeks    PT Treatment/Interventions  ADLs/Self Care Home Management;Manual techniques;Manual lymph drainage;Compression bandaging;Patient/family education;Therapeutic exercise;Other (comment)   wound care to include debridement   PT Next Visit Plan  Continue with total decongestive techniques measure for volume each week.  Measure on Wed.  Monitor any changes in red rings/rashes.    PT Home Exercise Plan  LE exercises and diaphragmic breathing       Patient will benefit from skilled therapeutic intervention in order to improve the following deficits and impairments:  Abnormal gait, Decreased activity tolerance, Decreased range of motion, Difficulty walking, Increased edema, Pain, Decreased skin integrity  Visit Diagnosis: Lymphedema, not elsewhere classified     Problem List Patient Active Problem List   Diagnosis Date Noted  . Stasis dermatitis of both legs 12/24/2018  . Leg pain, bilateral 12/24/2018  . Degenerative disc disease, lumbar 12/24/2018  . Fibromyalgia 12/24/2018  . Vitamin D deficiency 12/24/2018  . Class 3 severe obesity with serious comorbidity and body mass index (BMI) of 40.0 to 44.9 in adult ([place]) 07/23/2017  . Closed fracture of right inferior pubic ramus ([place]) 11/18/2016  . Gastroesophageal reflux  disease 06/06/2016  . Recurrent  major depressive disorder, in full remission ([place]) 06/06/2016  . DM type 2 with diabetic dyslipidemia ([place]) 03/06/2016  . Edema 09/14/2013  . Precordial pain 08/20/2011  . Essential hypertension, benign   . Hyperlipidemia    Teena Irani, PTA/CLT [phone]  Teena Irani 07/12/2019, 5:30 PM  [facility] [place], [place], [address] Phone: [phone]   Fax:  [phone]  Name: Tiffany Brown MRN: [id number] Date of Birth: 28-Dec-1945

## 2019-07-13 ENCOUNTER — Ambulatory Visit
Admission: RE | Admit: 2019-07-13 | Discharge: 2019-07-13 | Disposition: A | Discharge status: Home / Self Care | Payer: Medicare Other | Source: Ambulatory Visit | Attending: Physician Assistant | Admitting: Physician Assistant

## 2019-07-13 DIAGNOSIS — R1031 Right lower quadrant pain: Secondary | ICD-10-CM | POA: Diagnosis not present

## 2019-07-13 DIAGNOSIS — R1030 Lower abdominal pain, unspecified: Secondary | ICD-10-CM

## 2019-07-13 MED ORDER — IOPAMIDOL (ISOVUE-300) INJECTION 61%
125.0000 mL | Freq: Once | INTRAVENOUS | Status: AC | PRN
  Administered 2019-07-13: 125 mL via INTRAVENOUS

## 2019-07-14 ENCOUNTER — Encounter (HOSPITAL_COMMUNITY): Payer: Self-pay

## 2019-07-14 ENCOUNTER — Ambulatory Visit (HOSPITAL_COMMUNITY): Payer: Medicare Other

## 2019-07-14 ENCOUNTER — Other Ambulatory Visit: Payer: Self-pay

## 2019-07-14 DIAGNOSIS — R2681 Unsteadiness on feet: Secondary | ICD-10-CM | POA: Diagnosis not present

## 2019-07-14 DIAGNOSIS — R296 Repeated falls: Secondary | ICD-10-CM | POA: Diagnosis not present

## 2019-07-14 DIAGNOSIS — I890 Lymphedema, not elsewhere classified: Secondary | ICD-10-CM

## 2019-07-14 DIAGNOSIS — M6281 Muscle weakness (generalized): Secondary | ICD-10-CM | POA: Diagnosis not present

## 2019-07-14 DIAGNOSIS — S81801A Unspecified open wound, right lower leg, initial encounter: Secondary | ICD-10-CM | POA: Diagnosis not present

## 2019-07-14 NOTE — Therapy (Signed)
[facility] [address] [place], [place], [address] Phone: [phone]   Fax:  [phone]  Physical Therapy Treatment  Patient Details  Name: Tiffany Brown MRN: [id number] Date of Birth: December 25, 1945 Referring Provider (PT): Carloyn Jaeger   Encounter Date: 07/14/2019  PT End of Session - 07/14/19 1346    Visit Number  17    Number of Visits  18    Date for PT Re-Evaluation  07/19/19    Authorization Type  [employer] MEdic[employer]    Authorization - Visit Number  7    Authorization - Number of Visits  10    Progress Note Due on Visit  20    PT Start Time  1055    PT Stop Time  1225    PT Time Calculation (min)  90 min    Activity Tolerance  Patient tolerated treatment well    Behavior During Therapy  [facility] for tasks assessed/performed       Past Medical History:  Diagnosis Date  . Abnormal glucose   . Depression with anxiety   . Essential hypertension, benign   . Fibromyalgia   . Melanoma ([place])   . Near syncope   . Other and unspecified hyperlipidemia     Past Surgical History:  Procedure Laterality Date  . ABDOMINAL HYSTERECTOMY    . BREAST SURGERY     LEFT  . CHOLECYSTECTOMY    . ELBOW SURGERY     X's 3 Left  . RIGHT DISTAL RADIUS FRACTURE    . TOTAL KNEE ARTHROPLASTY     RIGHT    There were no vitals filed for this visit.  Subjective Assessment - 07/14/19 1341    Subjective  Pt reports an abdominal CT yesterday.  Reports she feels relief in abdominal area wiht lymphedema treatments.    Pertinent History  HTN, frequent falls, Chronic lower back, ( has been having injections for years),  and bilateral knee pain, Stasis dermatitis    Patient Stated Goals  improve balance, feel more confident with walking    Currently in Pain?  No/denies            LYMPHEDEMA/ONCOLOGY QUESTIONNAIRE - 07/14/19 1053      Right Lower Extremity Lymphedema   20 cm Proximal to Suprapatella  71.3 cm    10 cm Proximal to Suprapatella  55.6  cm   was 63.9   At Midpatella/Popliteal Crease  45.8 cm   was 50 cm   30 cm Proximal to Floor at Lateral Plantar Foot  50.5 cm   was 44 cm   20 cm Proximal to Floor at Lateral Plantar Foot  32.8 1   was 34.2   10 cm Proximal to Floor at Lateral Malleoli  23.8 cm   was 22.4   Circumference of ankle/heel  34 cm.   was 34.7   5 cm Proximal to 1st MTP Joint  23.7 cm   was 24.6   Across MTP Joint  22 cm   was 24.9   Around Proximal Great Toe  8.3 cm   was 9 cm     Left Lower Extremity Lymphedema   20 cm Proximal to Suprapatella  71.3 cm   was 72   10 cm Proximal to Suprapatella  55.3 cm   was 59.5   At Midpatella/Popliteal Crease  42.8 cm   was 49.5   30 cm Proximal to Floor at Lateral Plantar Foot  39.8 cm   was 41.3  20 cm Proximal to Floor at Lateral Plantar Foot  29.4 cm   was 30.8   10 cm Proximal to Floor at Lateral Malleoli  23 cm   was 24 cm   Circumference of ankle/heel  32.8 cm.   was 34.5   5 cm Proximal to 1st MTP Joint  23.8 cm   was 24.5   Across MTP Joint  23.1 cm   was 23.5   Around Proximal Great Toe  8.3 cm   was 9.5                         PT Short Term Goals - 07/09/19 1156      PT SHORT TERM GOAL #1   Title  Pt wound on anterior aspect of Rt LE to be healed    Time  3    Period  Weeks    Status  Achieved    Target Date  06/28/19      PT SHORT TERM GOAL #2   Title  Pt volume in thigh to have decreased 2-3 cm; LE 1--2    Time  3    Period  Weeks    Status  Achieved      PT SHORT TERM GOAL #3   Title  PT to be completing LE exercises at least daily to increase lymphatic circulation.    Time  3    Period  Weeks    Status  Achieved        PT Long Term Goals - 06/10/19 1056      PT LONG TERM GOAL #1   Title  PT to have lost 4-6  cm of volume from her thighs  ;2-4 from her LE    Time  6    Period  Weeks    Status  On-going      PT LONG TERM GOAL #2   Title  Pt to no longer have erythema in her LE    Time  6     Period  Weeks    Status  On-going      PT LONG TERM GOAL #3   Title  PT to have new compression garments that provide 20-3-mmHG and to be wearing on a daily basis    Time  6    Period  Weeks    Status  On-going      PT LONG TERM GOAL #4   Title  PT to have obtained the biocompression sleeve for her pump , ( pt is going to try and see who she recieved the pump from).    Status  On-going            Plan - 07/14/19 1347    Clinical Impression Statement  Measurements taken BLE.  Continued manual lymph drainage and multilayer short stretch bandaging to full thighs.  No changes noted with Rt medial/lateral ankle rash abnd the red rings on bother anterior thigs.  Continued wiht application of mupirocin cream.  Change to lotrimen application next session.    Personal Factors and Comorbidities  Fitness;Past/Current Experience;Time since onset of injury/illness/exacerbation    Comorbidities  HTN, frequent falls, Chronic lower back, ( has been having injections for years), and bilateral knee pain, Stasis dermatitis    Examination-Activity Limitations  Bathing;Caring for Others;Dressing;Hygiene/Grooming;Locomotion Level;Squat;Stairs;Stand    Examination-Participation Restrictions  Cleaning;Community Activity;Yard Work    Stability/Clinical Decision Making  Evolving/Moderate complexity    Clinical Decision Making  Moderate  Rehab Potential  Good    PT Frequency  3x / week    PT Duration  6 weeks    PT Treatment/Interventions  ADLs/Self Care Home Management;Manual techniques;Manual lymph drainage;Compression bandaging;Patient/family education;Therapeutic exercise;Other (comment)    PT Next Visit Plan  Progress note next session.  Change to lotrimen application on rash/rings next session.  Continue with total decongestive techniques measure for volume each week.  Measure on Wed.  Monitor any changes in red rings/rashes.    PT Home Exercise Plan  LE exercises and diaphragmic breathing        Patient will benefit from skilled therapeutic intervention in order to improve the following deficits and impairments:  Abnormal gait, Decreased activity tolerance, Decreased range of motion, Difficulty walking, Increased edema, Pain, Decreased skin integrity  Visit Diagnosis: Lymphedema, not elsewhere classified  Leg wound, right, initial encounter     Problem List Patient Active Problem List   Diagnosis Date Noted  . Stasis dermatitis of both legs 12/24/2018  . Leg pain, bilateral 12/24/2018  . Degenerative disc disease, lumbar 12/24/2018  . Fibromyalgia 12/24/2018  . Vitamin D deficiency 12/24/2018  . Class 3 severe obesity with serious comorbidity and body mass index (BMI) of 40.0 to 44.9 in adult ([place]) 07/23/2017  . Closed fracture of right inferior pubic ramus ([place]) 11/18/2016  . Gastroesophageal reflux disease 06/06/2016  . Recurrent major depressive disorder, in full remission ([place]) 06/06/2016  . DM type 2 with diabetic dyslipidemia ([place]) 03/06/2016  . Edema 09/14/2013  . Precordial pain 08/20/2011  . Essential hypertension, benign   . Hyperlipidemia    Ihor Austin, LPTA/CLT; CBIS [phone]  Aldona Lento 07/14/2019, 1:53 PM  [place], [place], [address] Phone: [phone]   Fax:  [phone]  Name: Tiffany Brown MRN: [id number] Date of Birth: 10-09-1945

## 2019-07-15 ENCOUNTER — Encounter: Payer: Self-pay | Admitting: Family Medicine

## 2019-07-15 ENCOUNTER — Ambulatory Visit (INDEPENDENT_AMBULATORY_CARE_PROVIDER_SITE_OTHER): Payer: Medicare Other | Admitting: Family Medicine

## 2019-07-15 VITALS — BP 143/78 | HR 78 | Temp 98.7°F | Ht 65.5 in | Wt 270.0 lb

## 2019-07-15 DIAGNOSIS — F3342 Major depressive disorder, recurrent, in full remission: Secondary | ICD-10-CM

## 2019-07-15 DIAGNOSIS — E782 Mixed hyperlipidemia: Secondary | ICD-10-CM

## 2019-07-15 DIAGNOSIS — K219 Gastro-esophageal reflux disease without esophagitis: Secondary | ICD-10-CM | POA: Diagnosis not present

## 2019-07-15 DIAGNOSIS — I10 Essential (primary) hypertension: Secondary | ICD-10-CM

## 2019-07-15 DIAGNOSIS — E785 Hyperlipidemia, unspecified: Secondary | ICD-10-CM

## 2019-07-15 DIAGNOSIS — E1169 Type 2 diabetes mellitus with other specified complication: Secondary | ICD-10-CM | POA: Diagnosis not present

## 2019-07-15 NOTE — Progress Notes (Signed)
BP (!) 143/78   Pulse 78   Temp 98.7 F (37.1 C)   Ht 5' 5.5" (1.664 m)   Wt 270 lb (122.5 kg)   SpO2 98%   BMI 44.25 kg/m    Subjective:   Patient ID: Tiffany Brown, female    DOB: 07-Jun-1945, 74 y.o.   MRN: [id number]  HPI: Tiffany Brown is a 74 y.o. female presenting on 07/15/2019 for Medical Management of Chronic Issues, Depression, and Diabetes   HPI Type 2 diabetes mellitus Patient comes in today for recheck of his diabetes. Patient has been currently taking no medication currently has been diet controlled. Patient is currently on an ACE inhibitor/ARB. Patient has not seen an ophthalmologist this year. Patient denies any issues with their feet.   Hypertension Patient is currently on losartan and amlodipine, and their blood pressure today is 143/78. Patient denies any lightheadedness or dizziness. Patient denies headaches, blurred vision, chest pains, shortness of breath, or weakness. Denies any side effects from medication and is content with current medication.   Hyperlipidemia Patient is coming in for recheck of his hyperlipidemia. The patient is currently taking pravastatin. They deny any issues with myalgias or history of liver damage from it. They deny any focal numbness or weakness or chest pain.   Depression and anxiety Patient is currently taking Cymbalta and doing well on it.  She denies any suicidal ideations or thoughts of hurting self.  She is currently taking Cymbalta 120 mg and feels like it is doing well PHQ 9 of 21 denies that anything is worse though and she is very happy with where she is at  Relevant past medical, surgical, family and social history reviewed and updated as indicated. Interim medical history since our last visit reviewed. Allergies and medications reviewed and updated.  Review of Systems  Constitutional: Negative for chills and fever.  Eyes: Negative for visual disturbance.  Respiratory: Negative for chest tightness and shortness  of breath.   Cardiovascular: Negative for chest pain and leg swelling.  Musculoskeletal: Negative for back pain and gait problem.  Skin: Negative for rash.  Neurological: Negative for light-headedness and headaches.  Psychiatric/Behavioral: Positive for dysphoric mood. Negative for agitation, behavioral problems, self-injury, sleep disturbance and suicidal ideas. The patient is nervous/anxious.   All other systems reviewed and are negative.   Per HPI unless specifically indicated above   Allergies as of 07/15/2019      Reactions   Other       Medication List       Accurate as of July 15, 2019 11:59 PM. If you have any questions, ask your nurse or doctor.        amLODipine 5 MG tablet Commonly known as: NORVASC Take 1 tablet (5 mg total) by mouth daily.   DULoxetine 60 MG capsule Commonly known as: CYMBALTA Take 2 capsules (120 mg total) by mouth daily.   fluticasone 50 MCG/ACT nasal spray Commonly known as: FLONASE Place 2 sprays into the nose daily as needed (allergies).   furosemide 40 MG tablet Commonly known as: LASIX Take 1 tablet (40 mg total) by mouth 2 (two) times daily as needed.   HYDROcodone-acetaminophen 10-325 MG tablet Commonly known as: NORCO Take 1 tablet by mouth every 6 (six) hours as needed for moderate pain.   losartan 100 MG tablet Commonly known as: COZAAR Take 1 tablet (100 mg total) by mouth daily.   meloxicam 7.5 MG tablet Commonly known as: MOBIC Take 1 tablet (7.5 mg  total) by mouth daily.   pravastatin 80 MG tablet Commonly known as: PRAVACHOL Take 1 tablet (80 mg total) by mouth daily.   Vitamin D (Ergocalciferol) 1.25 MG (50000 UNIT) Caps capsule Commonly known as: DRISDOL Take 1 capsule (50,000 Units total) by mouth every 7 (seven) days.        Objective:   BP (!) 143/78   Pulse 78   Temp 98.7 F (37.1 C)   Ht 5' 5.5" (1.664 m)   Wt 270 lb (122.5 kg)   SpO2 98%   BMI 44.25 kg/m   Wt Readings from Last 3  Encounters:  07/15/19 270 lb (122.5 kg)  03/31/19 265 lb 9.6 oz (120.5 kg)  07/20/14 251 lb (113.9 kg)    Physical Exam Vitals and nursing note reviewed.  Constitutional:      General: She is not in acute distress.    Appearance: She is well-developed. She is not diaphoretic.  Eyes:     Conjunctiva/sclera: Conjunctivae normal.  Cardiovascular:     Rate and Rhythm: Normal rate and regular rhythm.     Heart sounds: Normal heart sounds. No murmur.  Pulmonary:     Effort: Pulmonary effort is normal. No respiratory distress.     Breath sounds: Normal breath sounds. No wheezing.  Skin:    General: Skin is warm and dry.     Findings: No rash.  Neurological:     Mental Status: She is alert and oriented to person, place, and time.     Coordination: Coordination normal.  Psychiatric:        Mood and Affect: Mood is anxious. Mood is not depressed.        Behavior: Behavior normal.        Thought Content: Thought content does not include suicidal ideation. Thought content does not include suicidal plan.       Assessment & Plan:   Problem List Items Addressed This Visit      Cardiovascular and Mediastinum   Essential hypertension, benign   Relevant Orders   CBC with Differential/Platelet   Lipid panel     Digestive   Gastroesophageal reflux disease   Relevant Orders   CBC with Differential/Platelet     Endocrine   DM type 2 with diabetic dyslipidemia ([place])   Relevant Orders   TSH   CMP14+EGFR   Bayer DCA Hb A1c Waived     Other   Hyperlipidemia - Primary   Relevant Orders   CBC with Differential/Platelet   Lipid panel   Recurrent major depressive disorder, in full remission ([place])   Relevant Orders   TSH      Continue current medication, blood work looks good from a month ago, will repeat with future.  No changes in medication. Follow up plan: Return in about 3 months (around 10/15/2019), or if symptoms worsen or fail to improve, for Diabetes and hypertension  recheck.  Counseling provided for all of the vaccine components Orders Placed This Encounter  Procedures  . CBC with Differential/Platelet  . Lipid panel  . TSH  . CMP14+EGFR  . Bayer [facility] Hb A1c [place], MD [facility] 07/21/2019, 9:40 PM

## 2019-07-16 ENCOUNTER — Ambulatory Visit (HOSPITAL_COMMUNITY): Payer: Medicare Other | Admitting: Physical Therapy

## 2019-07-16 ENCOUNTER — Other Ambulatory Visit: Payer: Self-pay

## 2019-07-16 ENCOUNTER — Encounter (HOSPITAL_COMMUNITY): Payer: Self-pay | Admitting: Physical Therapy

## 2019-07-16 DIAGNOSIS — I890 Lymphedema, not elsewhere classified: Secondary | ICD-10-CM

## 2019-07-16 DIAGNOSIS — M6281 Muscle weakness (generalized): Secondary | ICD-10-CM | POA: Diagnosis not present

## 2019-07-16 DIAGNOSIS — S81801A Unspecified open wound, right lower leg, initial encounter: Secondary | ICD-10-CM | POA: Diagnosis not present

## 2019-07-16 DIAGNOSIS — R296 Repeated falls: Secondary | ICD-10-CM | POA: Diagnosis not present

## 2019-07-16 DIAGNOSIS — R2681 Unsteadiness on feet: Secondary | ICD-10-CM | POA: Diagnosis not present

## 2019-07-16 NOTE — Therapy (Signed)
[facility] [address] [place], [place], [address] Phone: [phone]   Fax:  [phone]  Physical Therapy Treatment  Patient Details  Name: Tiffany Brown MRN: [id number] Date of Birth: 1945-08-26 Referring Provider (PT): Carloyn Jaeger, Lennie Odor  Progress Note Reporting Period 06/28/2019  to 07/16/2019  See note below for Objective Data and Assessment of Progress/Goals.      Encounter Date: 07/16/2019  PT End of Session - 07/16/19 1234    Visit Number  18    Number of Visits  30    Date for PT Re-Evaluation  08/13/19    Authorization Type  [employer] MEdicare    Progress Note Due on Visit  28    PT Start Time  1045    PT Stop Time  1210    PT Time Calculation (min)  85 min    Activity Tolerance  Patient tolerated treatment well    Behavior During Therapy  WFL for tasks assessed/performed       Past Medical History:  Diagnosis Date  . Abnormal glucose   . Depression with anxiety   . Essential hypertension, benign   . Fibromyalgia   . Melanoma ([place])   . Near syncope   . Other and unspecified hyperlipidemia     Past Surgical History:  Procedure Laterality Date  . ABDOMINAL HYSTERECTOMY    . BREAST SURGERY     LEFT  . CHOLECYSTECTOMY    . ELBOW SURGERY     X's 3 Left  . RIGHT DISTAL RADIUS FRACTURE    . TOTAL KNEE ARTHROPLASTY     RIGHT    There were no vitals filed for this visit.  Subjective Assessment - 07/16/19 1225    Subjective  Pt states she contacted [facility] [employer] who stated that they are still waiting for insurance authorization for the pump.  Has no pain and hasn't for at least two weeks.    Pertinent History  HTN, frequent falls, Chronic lower back, ( has been having injections for years),  and bilateral knee pain, Stasis dermatitis    Patient Stated Goals  improve balance, feel more confident with walking    Currently in Pain?  No/denies            LYMPHEDEMA/ONCOLOGY QUESTIONNAIRE -  07/16/19 1227      Right Lower Extremity Lymphedema   20 cm Proximal to Suprapatella  71.3 cm    10 cm Proximal to Suprapatella  55.6 cm   was 59 on visit 10   At Midpatella/Popliteal Crease  45.8 cm   48 on visit 10   30 cm Proximal to Floor at Lateral Plantar Foot  40.5 cm   was 41   20 cm Proximal to Floor at Lateral Plantar Foot  32.8 1   was 31.9   10 cm Proximal to Floor at Lateral Malleoli  23.8 cm   was 23.7   Circumference of ankle/heel  34 cm.   was 32.7   5 cm Proximal to 1st MTP Joint  23.7 cm   was 24.3   Across MTP Joint  22 cm   was 24.4   Around Proximal Great Toe  8.3 cm   was 8 cm     Left Lower Extremity Lymphedema   20 cm Proximal to Suprapatella  71.3 cm   was 70   10 cm Proximal to Suprapatella  55.3 cm   was 56.5 visit 10   At Midpatella/Popliteal Crease  42.8  cm   was 45.4    30 cm Proximal to Floor at Lateral Plantar Foot  39.8 cm   was 38.9   20 cm Proximal to Floor at Lateral Plantar Foot  29.4 cm   was 30.   10 cm Proximal to Floor at Lateral Malleoli  23 cm   was 22.8cm   Circumference of ankle/heel  32.8 cm.   was 32.8   5 cm Proximal to 1st MTP Joint  23.8 cm   was 24.   Across MTP Joint  23.1 cm   was 22.8   Around Proximal Great Toe  8.3 cm   was 8               [facility] Adult PT Treatment/Exercise - 07/16/19 0001      Manual Therapy   Manual Therapy  Manual Lymphatic Drainage (MLD);Compression Bandaging    Manual therapy comments  completed seperate from all other aspects of treatment.     Manual Lymphatic Drainage (MLD)  for bilateral LE's including short neck, deep and superficial abdominal and lumbar nodes, inguinal-axillary anastomosis bilaterally  LE completed both supine and pront.    Compression Bandaging  1/2" foam and multilayer short stretch bandaging toe to thigh B               PT Short Term Goals - 07/16/19 1239      PT SHORT TERM GOAL #1   Title  Pt wound on anterior aspect of Rt LE to be healed     Time  3    Period  Weeks    Status  Achieved    Target Date  06/28/19      PT SHORT TERM GOAL #2   Title  Pt volume in thigh to have decreased 2-3 cm; LE 1--2    Time  3    Period  Weeks    Status  Achieved      PT SHORT TERM GOAL #3   Title  PT to be completing LE exercises at least daily to increase lymphatic circulation.    Time  3    Period  Weeks    Status  Achieved        PT Long Term Goals - 07/16/19 1239      PT LONG TERM GOAL #1   Title  PT to have lost 4-6  cm of volume from her thighs  ;2-4 from her LE    Time  6    Period  Weeks    Status  Partially Met      PT LONG TERM GOAL #2   Title  Pt to no longer have erythema in her LE    Time  6    Period  Weeks    Status  On-going      PT LONG TERM GOAL #3   Title  PT to have new compression garments that provide 20-3-mmHG and to be wearing on a daily basis    Time  6    Period  Weeks    Status  Achieved      PT LONG TERM GOAL #4   Title  PT to have obtained the biocompression sleeve for her pump , ( pt is going to try and see who she recieved the pump from).    Status  On-going            Plan - 07/16/19 1235    Clinical Impression Statement  Lotrimen placed on rashes on  pt LE.  PT is now Independent in self manual and exercises.  Volumes compared to visit 10 demonstrate that pt is continuing to decongest.  PT redness of LE is now B was 3" superior to knee down now redness is 2" distal to knee B. Pt will continut to benefit from skilled PT for total decongestive techniques until volumes stabilize and then she will begin a maintenance phase of treatment.    Personal Factors and Comorbidities  Fitness;Past/Current Experience;Time since onset of injury/illness/exacerbation    Comorbidities  HTN, frequent falls, Chronic lower back, ( has been having injections for years), and bilateral knee pain, Stasis dermatitis    Examination-Activity Limitations  Bathing;Caring for  Others;Dressing;Hygiene/Grooming;Locomotion Level;Squat;Stairs;Stand    Examination-Participation Restrictions  Cleaning;Community Activity;Yard Work    [employer]  Evolving/Moderate complexity    Rehab Potential  Good    PT Frequency  3x / week    PT Duration  --   10 weeks   PT Treatment/Interventions  ADLs/Self Care Home Management;Manual techniques;Manual lymph drainage;Compression bandaging;Patient/family education;Therapeutic exercise;Other (comment)    PT Next Visit Plan  .  Continue with total decongestive techniques measure for volume each week.  Measure on Wed.  Monitor any changes in red rings/rashes.    PT Home Exercise Plan  LE exercises and diaphragmic breathing       Patient will benefit from skilled therapeutic intervention in order to improve the following deficits and impairments:  Abnormal gait, Decreased activity tolerance, Decreased range of motion, Difficulty walking, Increased edema, Pain, Decreased skin integrity  Visit Diagnosis: Lymphedema, not elsewhere classified     Problem List Patient Active Problem List   Diagnosis Date Noted  . Stasis dermatitis of both legs 12/24/2018  . Leg pain, bilateral 12/24/2018  . Degenerative disc disease, lumbar 12/24/2018  . Fibromyalgia 12/24/2018  . Vitamin D deficiency 12/24/2018  . Class 3 severe obesity with serious comorbidity and body mass index (BMI) of 40.0 to 44.9 in adult ([place]) 07/23/2017  . Closed fracture of right inferior pubic ramus ([place]) 11/18/2016  . Gastroesophageal reflux disease 06/06/2016  . Recurrent major depressive disorder, in full remission ([place]) 06/06/2016  . DM type 2 with diabetic dyslipidemia ([place]) 03/06/2016  . Edema 09/14/2013  . Precordial pain 08/20/2011  . Essential hypertension, benign   . Hyperlipidemia     Rayetta Humphrey, [place] CLT [phone] 07/16/2019, 12:41 PM  [facility] [address] [place], [place],  [address] Phone: [phone]   Fax:  [phone]  Name: Tiffany Brown MRN: [id number] Date of Birth: [birth year]

## 2019-07-20 ENCOUNTER — Other Ambulatory Visit: Payer: Self-pay

## 2019-07-20 ENCOUNTER — Ambulatory Visit (HOSPITAL_COMMUNITY): Payer: Medicare Other | Admitting: Physical Therapy

## 2019-07-20 DIAGNOSIS — R296 Repeated falls: Secondary | ICD-10-CM | POA: Diagnosis not present

## 2019-07-20 DIAGNOSIS — R2681 Unsteadiness on feet: Secondary | ICD-10-CM | POA: Diagnosis not present

## 2019-07-20 DIAGNOSIS — S81801A Unspecified open wound, right lower leg, initial encounter: Secondary | ICD-10-CM

## 2019-07-20 DIAGNOSIS — M6281 Muscle weakness (generalized): Secondary | ICD-10-CM | POA: Diagnosis not present

## 2019-07-20 DIAGNOSIS — I890 Lymphedema, not elsewhere classified: Secondary | ICD-10-CM

## 2019-07-20 NOTE — Therapy (Signed)
[facility] [address] [place], [place], [address] Phone: [phone]   Fax:  [phone]  Physical Therapy Treatment  Patient Details  Name: Tiffany Brown MRN: [id number] Date of Birth: 10-21-1945 Referring Provider (PT): Carloyn Jaeger   Encounter Date: 07/20/2019  PT End of Session - 07/20/19 1203    Visit Number  19    Number of Visits  30    Date for PT Re-Evaluation  08/13/19    Authorization Type  [employer] MEdicare    Progress Note Due on Visit  28    PT Start Time  0918    PT Stop Time  1045    PT Time Calculation (min)  87 min    Activity Tolerance  Patient tolerated treatment well    Behavior During Therapy  [facility] for tasks assessed/performed       Past Medical History:  Diagnosis Date  . Abnormal glucose   . Depression with anxiety   . Essential hypertension, benign   . Fibromyalgia   . Melanoma ([place])   . Near syncope   . Other and unspecified hyperlipidemia     Past Surgical History:  Procedure Laterality Date  . ABDOMINAL HYSTERECTOMY    . BREAST SURGERY     LEFT  . CHOLECYSTECTOMY    . ELBOW SURGERY     X's 3 Left  . RIGHT DISTAL RADIUS FRACTURE    . TOTAL KNEE ARTHROPLASTY     RIGHT    There were no vitals filed for this visit.  Subjective Assessment - 07/20/19 1201    Subjective  pt reports she is doing well.  Has spoke to [facility] and still waiting on insurance for pump.  STates she is going to talk to him about trying the capris because the pantyhose are just too difficult with the arthritis in her hands.    Currently in Pain?  No/denies                       [facility] Adult PT Treatment/Exercise - 07/20/19 0001      Manual Therapy   Manual Therapy  Manual Lymphatic Drainage (MLD);Compression Bandaging    Manual therapy comments  completed seperate from all other aspects of treatment.     Manual Lymphatic Drainage (MLD)  for bilateral LE's including short neck, deep and superficial  abdominal and lumbar nodes, inguinal-axillary anastomosis bilaterally  LE completed both supine and pront.    Compression Bandaging  1/2" foam and multilayer short stretch bandaging toe to thigh B             PT Education - 07/20/19 1203    Education Details  instructed to contact dermatologist to f/u on areas if not cleared up soon.    Person(s) Educated  Patient    Methods  Explanation    Comprehension  Verbalized understanding       PT Short Term Goals - 07/16/19 1239      PT SHORT TERM GOAL #1   Title  Pt wound on anterior aspect of Rt LE to be healed    Time  3    Period  Weeks    Status  Achieved    Target Date  06/28/19      PT SHORT TERM GOAL #2   Title  Pt volume in thigh to have decreased 2-3 cm; LE 1--2    Time  3    Period  Weeks    Status  Achieved      PT SHORT TERM GOAL #3   Title  PT to be completing LE exercises at least daily to increase lymphatic circulation.    Time  3    Period  Weeks    Status  Achieved        PT Long Term Goals - 07/16/19 1239      PT LONG TERM GOAL #1   Title  PT to have lost 4-6  cm of volume from her thighs  ;2-4 from her LE    Time  6    Period  Weeks    Status  Partially Met      PT LONG TERM GOAL #2   Title  Pt to no longer have erythema in her LE    Time  6    Period  Weeks    Status  On-going      PT LONG TERM GOAL #3   Title  PT to have new compression garments that provide 20-3-mmHG and to be wearing on a daily basis    Time  6    Period  Weeks    Status  Achieved      PT LONG TERM GOAL #4   Title  PT to have obtained the biocompression sleeve for her pump , ( pt is going to try and see who she recieved the pump from).    Status  On-going            Plan - 07/20/19 1205    Clinical Impression Statement  Continued with complete lymphedema treatment anteriorly and posteriorly.  Applied lotrimen to areas/rashes on bil LE and moisturized well in all other areas.  Encouraged to return to  dermatologist if areas did not start improving soon.  Pt to follow up with [facility] Cornea regarding the capris.    Personal Factors and Comorbidities  Fitness;Past/Current Experience;Time since onset of injury/illness/exacerbation    Comorbidities  HTN, frequent falls, Chronic lower back, ( has been having injections for years), and bilateral knee pain, Stasis dermatitis    Examination-Activity Limitations  Bathing;Caring for Others;Dressing;Hygiene/Grooming;Locomotion Level;Squat;Stairs;Stand    Examination-Participation Restrictions  Cleaning;Community Activity;Yard Work    [employer]  Evolving/Moderate complexity    Rehab Potential  Good    PT Frequency  3x / week    PT Duration  --   10 weeks   PT Treatment/Interventions  ADLs/Self Care Home Management;Manual techniques;Manual lymph drainage;Compression bandaging;Patient/family education;Therapeutic exercise;Other (comment)    PT Next Visit Plan  .  Continue with total decongestive techniques measure for volume each week.  Measure on Wed.  Monitor any changes in red rings/rashes.    PT Home Exercise Plan  LE exercises and diaphragmic breathing       Patient will benefit from skilled therapeutic intervention in order to improve the following deficits and impairments:  Abnormal gait, Decreased activity tolerance, Decreased range of motion, Difficulty walking, Increased edema, Pain, Decreased skin integrity  Visit Diagnosis: Lymphedema, not elsewhere classified  Leg wound, right, initial encounter     Problem List Patient Active Problem List   Diagnosis Date Noted  . Stasis dermatitis of both legs 12/24/2018  . Leg pain, bilateral 12/24/2018  . Degenerative disc disease, lumbar 12/24/2018  . Fibromyalgia 12/24/2018  . Vitamin D deficiency 12/24/2018  . Class 3 severe obesity with serious comorbidity and body mass index (BMI) of 40.0 to 44.9 in adult ([place]) 07/23/2017  . Closed fracture of right inferior pubic ramus  ([place]) 11/18/2016  .  Gastroesophageal reflux disease 06/06/2016  . Recurrent major depressive disorder, in full remission ([place]) 06/06/2016  . DM type 2 with diabetic dyslipidemia ([place]) 03/06/2016  . Edema 09/14/2013  . Precordial pain 08/20/2011  . Essential hypertension, benign   . Hyperlipidemia    Teena Irani, PTA/CLT [phone]  Teena Irani 07/20/2019, 12:08 PM  [facility] [address] [place], [place], [address] Phone: [phone]   Fax:  [phone]  Name: AHRI OLSON MRN: [id number] Date of Birth: 01/04/1946

## 2019-07-21 ENCOUNTER — Ambulatory Visit (HOSPITAL_COMMUNITY): Payer: Medicare Other | Admitting: Physical Therapy

## 2019-07-21 DIAGNOSIS — R296 Repeated falls: Secondary | ICD-10-CM | POA: Diagnosis not present

## 2019-07-21 DIAGNOSIS — S81801A Unspecified open wound, right lower leg, initial encounter: Secondary | ICD-10-CM | POA: Diagnosis not present

## 2019-07-21 DIAGNOSIS — R2681 Unsteadiness on feet: Secondary | ICD-10-CM | POA: Diagnosis not present

## 2019-07-21 DIAGNOSIS — I890 Lymphedema, not elsewhere classified: Secondary | ICD-10-CM | POA: Diagnosis not present

## 2019-07-21 DIAGNOSIS — M6281 Muscle weakness (generalized): Secondary | ICD-10-CM | POA: Diagnosis not present

## 2019-07-21 NOTE — Therapy (Signed)
[facility] [address] [place], [place], [address] Phone: [phone]   Fax:  [phone]  Physical Therapy Treatment  Patient Details  Name: Tiffany Brown MRN: [id number] Date of Birth: 09/26/1945 Referring Provider (PT): Carloyn Jaeger   Encounter Date: 07/21/2019  PT End of Session - 07/21/19 1204    Visit Number  20    Number of Visits  30    Date for PT Re-Evaluation  08/13/19    Authorization Type  [employer] MEdicare; PN completed at visit #18    Progress Note Due on Visit  28    PT Start Time  1010    PT Stop Time  1140    PT Time Calculation (min)  90 min    Activity Tolerance  Patient tolerated treatment well    Behavior During Therapy  [facility] for tasks assessed/performed       Past Medical History:  Diagnosis Date  . Abnormal glucose   . Depression with anxiety   . Essential hypertension, benign   . Fibromyalgia   . Melanoma ([place])   . Near syncope   . Other and unspecified hyperlipidemia     Past Surgical History:  Procedure Laterality Date  . ABDOMINAL HYSTERECTOMY    . BREAST SURGERY     LEFT  . CHOLECYSTECTOMY    . ELBOW SURGERY     X's 3 Left  . RIGHT DISTAL RADIUS FRACTURE    . TOTAL KNEE ARTHROPLASTY     RIGHT    There were no vitals filed for this visit.  Subjective Assessment - 07/21/19 1153    Subjective  pt states she is doing well today.  Therapist messaged Corene Cornea A to ask to get with patient regarding capri compression.    Currently in Pain?  No/denies            LYMPHEDEMA/ONCOLOGY QUESTIONNAIRE - 07/21/19 1155      Right Lower Extremity Lymphedema   20 cm Proximal to Suprapatella  72 cm    10 cm Proximal to Suprapatella  56.5 cm   was 59 on visit 10   At Midpatella/Popliteal Crease  44.8 cm   48 on visit 10   30 cm Proximal to Floor at Lateral Plantar Foot  42 cm   was 41   20 cm Proximal to Floor at Lateral Plantar Foot  32 1   was 31.9   10 cm Proximal to Floor at Lateral Malleoli   23.8 cm   was 23.7   Circumference of ankle/heel  33 cm.   was 32.7   5 cm Proximal to 1st MTP Joint  24.5 cm   was 24.3   Across MTP Joint  24 cm   was 24.4   Around Proximal Great Toe  8 cm   was 8 cm     Left Lower Extremity Lymphedema   20 cm Proximal to Suprapatella  71 cm   was 70   10 cm Proximal to Suprapatella  53.8 cm   was 56.5 visit 10   At Midpatella/Popliteal Crease  42 cm   was 45.4    30 cm Proximal to Floor at Lateral Plantar Foot  39 cm   was 38.9   20 cm Proximal to Floor at Lateral Plantar Foot  28.9 cm   was 30.   10 cm Proximal to Floor at Lateral Malleoli  23 cm   was 22.8cm   Circumference of ankle/heel  33 cm.  was 32.8   5 cm Proximal to 1st MTP Joint  24 cm   was 24.   Across MTP Joint  24 cm   was 22.8   Around Proximal Great Toe  8 cm   was 8               [facility] Adult PT Treatment/Exercise - 07/21/19 0001      Manual Therapy   Manual Therapy  Manual Lymphatic Drainage (MLD);Compression Bandaging;Other (comment)    Manual therapy comments  completed seperate from all other aspects of treatment.     Manual Lymphatic Drainage (MLD)  for bilateral LE's including short neck, deep and superficial abdominal and lumbar nodes, inguinal-axillary anastomosis bilaterally  LE completed both supine and pront.    Compression Bandaging  1/2" foam and multilayer short stretch bandaging toe to thigh B    Other Manual Therapy  MEASUREMENT               PT Short Term Goals - 07/16/19 1239      PT SHORT TERM GOAL #1   Title  Pt wound on anterior aspect of Rt LE to be healed    Time  3    Period  Weeks    Status  Achieved    Target Date  06/28/19      PT SHORT TERM GOAL #2   Title  Pt volume in thigh to have decreased 2-3 cm; LE 1--2    Time  3    Period  Weeks    Status  Achieved      PT SHORT TERM GOAL #3   Title  PT to be completing LE exercises at least daily to increase lymphatic circulation.    Time  3    Period  Weeks     Status  Achieved        PT Long Term Goals - 07/16/19 1239      PT LONG TERM GOAL #1   Title  PT to have lost 4-6  cm of volume from her thighs  ;2-4 from her LE    Time  6    Period  Weeks    Status  Partially Met      PT LONG TERM GOAL #2   Title  Pt to no longer have erythema in her LE    Time  6    Period  Weeks    Status  On-going      PT LONG TERM GOAL #3   Title  PT to have new compression garments that provide 20-3-mmHG and to be wearing on a daily basis    Time  6    Period  Weeks    Status  Achieved      PT LONG TERM GOAL #4   Title  PT to have obtained the biocompression sleeve for her pump , ( pt is going to try and see who she recieved the pump from).    Status  On-going            Plan - 07/21/19 1205    Clinical Impression Statement  LE's measured this session with only slight change from [place].  Slightly down on Lt, up and down on Rt.  Pt is still awaiting her pump and follow up with jason regarding capris. Pt will then order her knee highs from [facility] if she goes with the capri option.  Pt continues to wear her pantyhose, however very difficult to don/doff independently due  to arthritis in her hands and soft tissue block straingin to get to her feet.  Rash is going away around ankle, however 2 round rings on thighs have not changed.    Personal Factors and Comorbidities  Fitness;Past/Current Experience;Time since onset of injury/illness/exacerbation    Comorbidities  HTN, frequent falls, Chronic lower back, ( has been having injections for years), and bilateral knee pain, Stasis dermatitis    Examination-Activity Limitations  Bathing;Caring for Others;Dressing;Hygiene/Grooming;Locomotion Level;Squat;Stairs;Stand    Examination-Participation Restrictions  Cleaning;Community Activity;Yard Work    Merchant navy officer  Evolving/Moderate complexity    Rehab Potential  Good    PT Frequency  3x / week    PT Duration  --   10 weeks   PT  Treatment/Interventions  ADLs/Self Care Home Management;Manual techniques;Manual lymph drainage;Compression bandaging;Patient/family education;Therapeutic exercise;Other (comment)    PT Next Visit Plan  Continue with total decongestive techniques measure for volume each week.  Measure on Wed.  Monitor any changes in red rings/rashes.    PT Home Exercise Plan  LE exercises and diaphragmic breathing       Patient will benefit from skilled therapeutic intervention in order to improve the following deficits and impairments:  Abnormal gait, Decreased activity tolerance, Decreased range of motion, Difficulty walking, Increased edema, Pain, Decreased skin integrity  Visit Diagnosis: Lymphedema, not elsewhere classified     Problem List Patient Active Problem List   Diagnosis Date Noted  . Stasis dermatitis of both legs 12/24/2018  . Leg pain, bilateral 12/24/2018  . Degenerative disc disease, lumbar 12/24/2018  . Fibromyalgia 12/24/2018  . Vitamin D deficiency 12/24/2018  . Class 3 severe obesity with serious comorbidity and body mass index (BMI) of 40.0 to 44.9 in adult ([place]) 07/23/2017  . Closed fracture of right inferior pubic ramus ([place]) 11/18/2016  . Gastroesophageal reflux disease 06/06/2016  . Recurrent major depressive disorder, in full remission ([place]) 06/06/2016  . DM type 2 with diabetic dyslipidemia ([place]) 03/06/2016  . Edema 09/14/2013  . Precordial pain 08/20/2011  . Essential hypertension, benign   . Hyperlipidemia    Teena Irani, PTA/CLT [phone]  Teena Irani 07/21/2019, 12:08 PM  [facility] [address] [place], [place], [address] Phone: [phone]   Fax:  [phone]  Name: Tiffany Brown MRN: [id number] Date of Birth: 1945/06/28

## 2019-07-22 ENCOUNTER — Ambulatory Visit (HOSPITAL_COMMUNITY): Payer: Medicare Other | Admitting: Physical Therapy

## 2019-07-22 ENCOUNTER — Ambulatory Visit (INDEPENDENT_AMBULATORY_CARE_PROVIDER_SITE_OTHER): Payer: Medicare Other | Admitting: *Deleted

## 2019-07-22 DIAGNOSIS — Z0000 Encounter for general adult medical examination without abnormal findings: Secondary | ICD-10-CM

## 2019-07-22 NOTE — Progress Notes (Signed)
MEDICARE ANNUAL WELLNESS VISIT  07/22/2019  Telephone Visit Disclaimer This Medicare AWV was conducted by telephone due to national recommendations for restrictions regarding the COVID-19 Pandemic (e.g. social distancing).  I verified, using two identifiers, that I am speaking with Tiffany Brown or their authorized healthcare agent. I discussed the limitations, risks, security, and privacy concerns of performing an evaluation and management service by telephone and the potential availability of an in-person appointment in the future. The patient expressed understanding and agreed to proceed.   Subjective:  Tiffany Brown is a 74 y.o. female patient of Dettinger, Fransisca Kaufmann, MD who had a Medicare Annual Wellness Visit today via telephone. Tiffany Brown is Disabled and lives with their spouse. she has 3 children. she reports that she is socially active and does interact with friends/family regularly. she is not physically active and enjoys reading and puzzles.  Patient Care Team: Dettinger, Fransisca Kaufmann, MD as PCP - General (Family Medicine)  Advanced Directives 07/22/2019 06/07/2019 04/07/2019 02/25/2017 01/20/2017 06/16/2014  Does Patient Have a Medical Advance Directive? Yes No No No No No  Type of Advance Directive Living will - - - - -  Does patient want to make changes to medical advance directive? No - Patient declined - - - - -  Would patient like information on creating a medical advance directive? - No - Patient declined - No - Patient declined Yes (ED - Information included in AVS) -    Hospital Utilization Over the Past 12 Months: # of hospitalizations or ER visits: 0 # of surgeries: 0  Review of Systems    Patient reports that her overall health is unchanged compared to last year.  History obtained from the patient  Patient Reported Readings (BP, Pulse, CBG, Weight, etc) none  Pain Assessment Pain : 0-10 Pain Score: 6  Pain Type: Chronic pain Pain Location: Back Pain  Orientation: Lower Pain Radiating Towards: both legs Pain Descriptors / Indicators: Constant, Aching, Nagging Pain Onset: More than a month ago Pain Frequency: Constant Pain Relieving Factors: Medication and Lymphedema treadments Effect of Pain on Daily Activities: moderate  Pain Relieving Factors: Medication and Lymphedema treadments  Current Medications & Allergies (verified) Allergies as of 07/22/2019      Reactions   Other       Medication List       Accurate as of July 22, 2019 10:27 AM. If you have any questions, ask your nurse or doctor.        amLODipine 5 MG tablet Commonly known as: NORVASC Take 1 tablet (5 mg total) by mouth daily.   DULoxetine 60 MG capsule Commonly known as: CYMBALTA Take 2 capsules (120 mg total) by mouth daily.   fluticasone 50 MCG/ACT nasal spray Commonly known as: FLONASE Place 2 sprays into the nose daily as needed (allergies).   furosemide 40 MG tablet Commonly known as: LASIX Take 1 tablet (40 mg total) by mouth 2 (two) times daily as needed.   gabapentin 100 MG capsule Commonly known as: NEURONTIN Take 100 mg by mouth 3 (three) times daily.   HYDROcodone-acetaminophen 10-325 MG tablet Commonly known as: NORCO Take 1 tablet by mouth every 6 (six) hours as needed for moderate pain.   losartan 100 MG tablet Commonly known as: COZAAR Take 1 tablet (100 mg total) by mouth daily.   meloxicam 7.5 MG tablet Commonly known as: MOBIC Take 1 tablet (7.5 mg total) by mouth daily.   omeprazole 40 MG capsule Commonly known as:  PRILOSEC Take 40 mg by mouth every morning.   Potassium Chloride ER 20 MEQ Tbcr   pravastatin 80 MG tablet Commonly known as: PRAVACHOL Take 1 tablet (80 mg total) by mouth daily.   Vitamin D (Ergocalciferol) 1.25 MG (50000 UNIT) Caps capsule Commonly known as: DRISDOL Take 1 capsule (50,000 Units total) by mouth every 7 (seven) days.       History (reviewed): Past Medical History:  Diagnosis Date   . Abnormal glucose   . Depression with anxiety   . Essential hypertension, benign   . Fibromyalgia   . Melanoma ([place])   . Near syncope   . Other and unspecified hyperlipidemia    Past Surgical History:  Procedure Laterality Date  . ABDOMINAL HYSTERECTOMY    . BREAST SURGERY     LEFT  . CHOLECYSTECTOMY    . ELBOW SURGERY     X's 3 Left  . RIGHT DISTAL RADIUS FRACTURE    . TOTAL KNEE ARTHROPLASTY     RIGHT   Family History  Problem Relation Age of Onset  . Cancer Mother        lymphoma  . Heart disease Maternal Grandmother        no details  . Suicidality Father   . Lung cancer Sister    Social History   Socioeconomic History  . Marital status: Married    Spouse name: jr  . Number of children: 3  . Years of education: 66  . Highest education level: 12th grade  Occupational History  . Occupation: Homemaker  Tobacco Use  . Smoking status: Former Smoker    Packs/day: 0.80    Years: 25.00    Pack years: 20.00    Types: Cigarettes    Quit date: 05/06/1994    Years since quitting: 25.2  . Smokeless tobacco: Never Used  Substance and Sexual Activity  . Alcohol use: Never  . Drug use: Never  . Sexual activity: Yes    Birth control/protection: Post-menopausal    Comment: Married since 1964  Other Topics Concern  . Not on file  Social History Narrative   Has 3 children. Lives with husband.   Social Determinants of Health   Financial Resource Strain:   . Difficulty of Paying Living Expenses:   Food Insecurity:   . Worried About Charity fundraiser in the Last Year:   . Arboriculturist in the Last Year:   Transportation Needs:   . Film/video editor (Medical):   Marland Kitchen Lack of Transportation (Non-Medical):   Physical Activity:   . Days of Exercise per Week:   . Minutes of Exercise per Session:   Stress:   . Feeling of Stress :   Social Connections:   . Frequency of Communication with Friends and Family:   . Frequency of Social Gatherings with Friends and  Family:   . Attends Religious Services:   . Active Member of Clubs or Organizations:   . Attends Archivist Meetings:   Marland Kitchen Marital Status:     Activities of Daily Living In your present state of health, do you have any difficulty performing the following activities: 07/22/2019  Hearing? N  Vision? N  Difficulty concentrating or making decisions? N  Walking or climbing stairs? Y  Comment lymphedema  Dressing or bathing? N  Doing errands, shopping? Y  Preparing Food and eating ? N  Using the Toilet? N  In the past six months, have you accidently leaked urine? N  Do  you have problems with loss of bowel control? N  Managing your Medications? N  Managing your Finances? N  Housekeeping or managing your Housekeeping? Y  Some recent data might be hidden    Patient Education/ Literacy How often do you need to have someone help you when you read instructions, pamphlets, or other written materials from your doctor or pharmacy?: 1 - Never What is the last grade level you completed in school?: 12  Exercise Current Exercise Habits: The patient does not participate in regular exercise at present, Exercise limited by: neurologic condition(s);orthopedic condition(s)  Diet Patient reports consuming 3 meals a day and 0 snack(s) a day Patient reports that her primary diet is: Regular Patient reports that she does have regular access to food.   Depression Screen PHQ 2/9 Scores 07/22/2019 03/31/2019  PHQ - 2 Score 2 4  PHQ- 9 Score 7 16     Fall Risk Fall Risk  07/22/2019 07/15/2019 03/31/2019  Falls in the past year? 0 1 1  Number falls in past yr: - 0 1  Injury with Fall? - 0 0     Objective:  Tiffany Brown seemed alert and oriented and she participated appropriately during our telephone visit.  Blood Pressure Weight BMI  BP Readings from Last 3 Encounters:  07/15/19 (!) 143/78  03/31/19 (!) 175/86  09/29/17 (!) 149/58   Wt Readings from Last 3 Encounters:  07/15/19  270 lb (122.5 kg)  03/31/19 265 lb 9.6 oz (120.5 kg)  07/20/14 251 lb (113.9 kg)   BMI Readings from Last 1 Encounters:  07/15/19 44.25 kg/m    *Unable to obtain current vital signs, weight, and BMI due to telephone visit type  Hearing/Vision  . Tiffany Brown did not seem to have difficulty with hearing/understanding during the telephone conversation . Reports that she has had a formal eye exam by an eye care professional within the past year . Reports that she has not had a formal hearing evaluation within the past year *Unable to fully assess hearing and vision during telephone visit type  Cognitive Function: 6CIT Screen 07/22/2019 07/22/2019  What Year? 0 points 0 points  What month? 0 points 0 points  What time? 0 points -  Count back from 20 0 points -  Months in reverse 2 points -  Repeat phrase 0 points -  Total Score 2 -   (Normal:0-7, Significant for Dysfunction: >8)  Normal Cognitive Function Screening: Yes   Immunization & Health Maintenance Record Immunization History  Administered Date(s) Administered  . Influenza, High Dose Seasonal PF 03/06/2016, 01/28/2017, 02/12/2018, 02/25/2019  . Influenza,trivalent, recombinat, inj, PF 02/10/2014, 02/07/2015  . Pneumococcal Conjugate-13 07/03/2015  . Pneumococcal Polysaccharide-23 08/05/2016  . Tdap 02/10/2014    Health Maintenance  Topic Date Due  . Hepatitis C Screening  03/30/2020 (Originally 1945-09-16)  . COLONOSCOPY  07/14/2020 (Originally 11/02/1994)  . HEMOGLOBIN A1C  12/28/2019  . MAMMOGRAM  05/19/2021  . TETANUS/TDAP  05/02/2029  . INFLUENZA VACCINE  Completed  . DEXA SCAN  Completed  . PNA vac Low Risk Adult  Completed  . FOOT EXAM  Discontinued  . OPHTHALMOLOGY EXAM  Discontinued       Assessment  This is a routine wellness examination for Tiffany Brown.  Health Maintenance: Due or Overdue There are no preventive care reminders to display for this patient.  Tiffany Brown does not need a referral  for Community Assistance: Care Management:   no Social Work:    no Prescription  Assistance:  no Nutrition/Diabetes Education:  no   Plan:  Personalized Goals Goals Addressed            This Visit's Progress   . Increase physical activity (pt-stated)       Gradually as Lymphedema gets better.    . Prevent falls (pt-stated)       Now walking with cane, be aware of surroundings.      Personalized Health Maintenance & Screening Recommendations  up to date  Lung Cancer Screening Recommended: no (Low Dose CT Chest recommended if Age 65-80 years, 30 pack-year currently smoking OR have quit w/in past 15 years) Hepatitis C Screening recommended: yes HIV Screening recommended: no  Advanced Directives: Written information was not prepared per patient's request.  Referrals & Orders No orders of the defined types were placed in this encounter.   Follow-up Plan . Follow-up with Dettinger, Fransisca Kaufmann, MD as planned on 10/20/19  Pt is in therapy for lymphedema, she states it is helping. Pt does get down and depressed at times, but can pull herself out she states. She is still active and is independent with all ADL's. She does walk with a cane.  Health maintenance is up to date other than Shingrix. Pt will think about it and let us know at next appt. Patient states they have a living will. Pt has no questions and voices no concerns.    I have personally reviewed and noted the following in the patient's chart:   . Medical and social history . Use of alcohol, tobacco or illicit drugs  . Current medications and supplements . Functional ability and status . Nutritional status . Physical activity . Advanced directives . List of other physicians . Hospitalizations, surgeries, and ER visits in previous 12 months . Vitals . Screenings to include cognitive, depression, and falls . Referrals and appointments  In addition, I have reviewed and discussed with Tiffany Brown certain  preventive protocols, quality metrics, and best practice recommendations. A written personalized care plan for preventive services as well as general preventive health recommendations is available and can be mailed to the patient at her request.      Rana Snare, LPN  [phone]

## 2019-07-23 ENCOUNTER — Encounter (HOSPITAL_COMMUNITY): Payer: Self-pay | Admitting: Physical Therapy

## 2019-07-23 ENCOUNTER — Other Ambulatory Visit: Payer: Self-pay

## 2019-07-23 ENCOUNTER — Ambulatory Visit (HOSPITAL_COMMUNITY): Payer: Medicare Other | Admitting: Physical Therapy

## 2019-07-23 DIAGNOSIS — I890 Lymphedema, not elsewhere classified: Secondary | ICD-10-CM | POA: Diagnosis not present

## 2019-07-23 DIAGNOSIS — S81801A Unspecified open wound, right lower leg, initial encounter: Secondary | ICD-10-CM

## 2019-07-23 DIAGNOSIS — R296 Repeated falls: Secondary | ICD-10-CM

## 2019-07-23 DIAGNOSIS — M6281 Muscle weakness (generalized): Secondary | ICD-10-CM

## 2019-07-23 DIAGNOSIS — R2681 Unsteadiness on feet: Secondary | ICD-10-CM | POA: Diagnosis not present

## 2019-07-23 NOTE — Therapy (Signed)
[facility] [address] [place], [place], [address] Phone: [phone]   Fax:  [phone]  Physical Therapy Treatment  Patient Details  Name: Tiffany Brown MRN: [id number] Date of Birth: 1945-07-21 Referring Provider (PT): Carloyn Jaeger   Encounter Date: 07/23/2019  PT End of Session - 07/23/19 1437    Visit Number  21    Number of Visits  30    Date for PT Re-Evaluation  08/13/19    Authorization Type  [employer] M[employer]; PN completed at visit #18    Authorization - Visit Number  8    Authorization - Number of Visits  10    Progress Note Due on Visit  28    PT Start Time  1316    PT Stop Time  1434    PT Time Calculation (min)  78 min    Activity Tolerance  Patient tolerated treatment well    Behavior During Therapy  [facility] for tasks assessed/performed       Past Medical History:  Diagnosis Date  . Abnormal glucose   . Depression with anxiety   . Essential hypertension, benign   . Fibromyalgia   . Melanoma ([place])   . Near syncope   . Other and unspecified hyperlipidemia     Past Surgical History:  Procedure Laterality Date  . ABDOMINAL HYSTERECTOMY    . BREAST SURGERY     LEFT  . CHOLECYSTECTOMY    . ELBOW SURGERY     X's 3 Left  . RIGHT DISTAL RADIUS FRACTURE    . TOTAL KNEE ARTHROPLASTY     RIGHT    There were no vitals filed for this visit.  Subjective Assessment - 07/23/19 1436    Subjective  PT is to get her compression pump next week.    Patient is accompained by:  Family member   spouse   Pertinent History  HTN, frequent falls, Chronic lower back, ( has been having injections for years),  and bilateral knee pain, Stasis dermatitis    Limitations  Walking;Standing;House hold activities    How long can you sit comfortably?  no problem    How long can you stand comfortably?  10 -15 minutes    How long can you walk comfortably?  walks with a rollator 10-15  minutes    Patient Stated Goals  improve balance, feel  more confident with walking    Pain Onset  More than a month ago                       [facility] Adult PT Treatment/Exercise - 07/23/19 0001      Manual Therapy   Manual Therapy  Manual Lymphatic Drainage (MLD);Compression Bandaging;Other (comment)    Manual therapy comments  completed seperate from all other aspects of treatment.     Manual Lymphatic Drainage (MLD)  for bilateral LE's including short neck, deep and superficial abdominal and lumbar nodes, inguinal-axillary anastomosis bilaterally  LE completed both supine and pront.    Compression Bandaging  1/2" foam and multilayer short stretch bandaging toe to thigh B    Other Manual Therapy               PT Short Term Goals - 07/16/19 1239      PT SHORT TERM GOAL #1   Title  Pt wound on anterior aspect of Rt LE to be healed    Time  3    Period  Weeks  Status  Achieved    Target Date  06/28/19      PT SHORT TERM GOAL #2   Title  Pt volume in thigh to have decreased 2-3 cm; LE 1--2    Time  3    Period  Weeks    Status  Achieved      PT SHORT TERM GOAL #3   Title  PT to be completing LE exercises at least daily to increase lymphatic circulation.    Time  3    Period  Weeks    Status  Achieved        PT Long Term Goals - 07/16/19 1239      PT LONG TERM GOAL #1   Title  PT to have lost 4-6  cm of volume from her thighs  ;2-4 from her LE    Time  6    Period  Weeks    Status  Partially Met      PT LONG TERM GOAL #2   Title  Pt to no longer have erythema in her LE    Time  6    Period  Weeks    Status  On-going      PT LONG TERM GOAL #3   Title  PT to have new compression garments that provide 20-3-mmHG and to be wearing on a daily basis    Time  6    Period  Weeks    Status  Achieved      PT LONG TERM GOAL #4   Title  PT to have obtained the biocompression sleeve for her pump , ( pt is going to try and see who she recieved the pump from).    Status  On-going            Plan -  07/23/19 1437    Clinical Impression Statement  Pt to get her pump next week.  PT has noted increased induration in Rt superior thigh, therapist used 5th upper leg short stretch in this area only in hopes of decreasing indurationn.    Personal Factors and Comorbidities  Fitness;Past/Current Experience;Time since onset of injury/illness/exacerbation    Comorbidities  HTN, frequent falls, Chronic lower back, ( has been having injections for years), and bilateral knee pain, Stasis dermatitis    Examination-Activity Limitations  Bathing;Caring for Others;Dressing;Hygiene/Grooming;Locomotion Level;Squat;Stairs;Stand    Examination-Participation Restrictions  Cleaning;Community Activity;Yard Work    [employer] officer  Evolving/Moderate complexity    Rehab Potential  Good    PT Frequency  3x / week    PT Duration  --   10 weeks   PT Treatment/Interventions  ADLs/Self Care Home Management;Manual techniques;Manual lymph drainage;Compression bandaging;Patient/family education;Therapeutic exercise;Other (comment)    PT Next Visit Plan  Possible D/C on Wed if pt has not reduced and has her compression.    PT Home Exercise Plan  LE exercises and diaphragmic breathing       Patient will benefit from skilled therapeutic intervention in order to improve the following deficits and impairments:  Abnormal gait, Decreased activity tolerance, Decreased range of motion, Difficulty walking, Increased edema, Pain, Decreased skin integrity  Visit Diagnosis: Lymphedema, not elsewhere classified  Leg wound, right, initial encounter  Muscle weakness (generalized)  Unsteadiness on feet  Repeated falls     Problem List Patient Active Problem List   Diagnosis Date Noted  . Stasis dermatitis of both legs 12/24/2018  . Leg pain, bilateral 12/24/2018  . Degenerative disc disease, lumbar 12/24/2018  . Fibromyalgia 12/24/2018  .  Vitamin D deficiency 12/24/2018  . Class 3 severe obesity with  serious comorbidity and body mass index (BMI) of 40.0 to 44.9 in adult ([place]) 07/23/2017  . Closed fracture of right inferior pubic ramus ([place]) 11/18/2016  . Gastroesophageal reflux disease 06/06/2016  . Recurrent major depressive disorder, in full remission ([place]) 06/06/2016  . DM type 2 with diabetic dyslipidemia ([place]) 03/06/2016  . Edema 09/14/2013  . Precordial pain 08/20/2011  . Essential hypertension, benign   . Hyperlipidemia     Rayetta Humphrey, [place] CLT [phone] 07/23/2019, 2:40 PM  [facility] [address] [place], [place], [address] Phone: [phone]   Fax:  [phone]  Name: Tiffany Brown MRN: [id number] Date of Birth: October 18, 1945

## 2019-07-27 ENCOUNTER — Other Ambulatory Visit: Payer: Self-pay

## 2019-07-27 ENCOUNTER — Ambulatory Visit (HOSPITAL_COMMUNITY): Payer: Medicare Other | Admitting: Physical Therapy

## 2019-07-27 DIAGNOSIS — M6281 Muscle weakness (generalized): Secondary | ICD-10-CM | POA: Diagnosis not present

## 2019-07-27 DIAGNOSIS — I890 Lymphedema, not elsewhere classified: Secondary | ICD-10-CM

## 2019-07-27 DIAGNOSIS — R2681 Unsteadiness on feet: Secondary | ICD-10-CM | POA: Diagnosis not present

## 2019-07-27 DIAGNOSIS — S81801A Unspecified open wound, right lower leg, initial encounter: Secondary | ICD-10-CM | POA: Diagnosis not present

## 2019-07-27 DIAGNOSIS — R296 Repeated falls: Secondary | ICD-10-CM | POA: Diagnosis not present

## 2019-07-27 NOTE — Therapy (Signed)
[facility] [address] [place], [place], [address] Phone: [phone]   Fax:  [phone]  Physical Therapy Treatment  Patient Details  Name: Tiffany Brown MRN: [id number] Date of Birth: [birth year] Referring Provider (PT): Carloyn Jaeger   Encounter Date: 07/27/2019  PT End of Session - 07/27/19 1220    Visit Number  22    Number of Visits  30    Date for PT Re-Evaluation  08/13/19    Authorization Type  [employer] M[employer]; PN completed at visit #45; [place] 8/09[birth year]    Authorization - Visit Number  9    Authorization - Number of Visits  10    Progress Note Due on Visit  28    PT Start Time  0924    PT Stop Time  1045    PT Time Calculation (min)  81 min    Activity Tolerance  Patient tolerated treatment well    Behavior During Therapy  [facility] for tasks assessed/performed       Past Medical History:  Diagnosis Date  . Abnormal glucose   . Depression with anxiety   . Essential hypertension, benign   . Fibromyalgia   . Melanoma ([place])   . Near syncope   . Other and unspecified hyperlipidemia     Past Surgical History:  Procedure Laterality Date  . ABDOMINAL HYSTERECTOMY    . BREAST SURGERY     LEFT  . CHOLECYSTECTOMY    . ELBOW SURGERY     X's 3 Left  . RIGHT DISTAL RADIUS FRACTURE    . TOTAL KNEE ARTHROPLASTY     RIGHT    There were no vitals filed for this visit.  Subjective Assessment - 07/27/19 1219    Subjective  pt states she is doing good and hopes to get her pump this week.                       [facility] Adult PT Treatment/Exercise - 07/27/19 0001      Manual Therapy   Manual Therapy  Manual Lymphatic Drainage (MLD);Compression Bandaging;Other (comment)    Manual therapy comments  completed seperate from all other aspects of treatment.     Manual Lymphatic Drainage (MLD)  for bilateral LE's including short neck, deep and superficial abdominal and lumbar nodes, inguinal-axillary anastomosis bilaterally   LE completed both supine and pront.    Compression Bandaging  1/2" foam and multilayer short stretch bandaging toe to thigh B               PT Short Term Goals - 07/16/19 1239      PT SHORT TERM GOAL #1   Title  Pt wound on anterior aspect of Rt LE to be healed    Time  3    Period  Weeks    Status  Achieved    Target Date  06/28/19      PT SHORT TERM GOAL #2   Title  Pt volume in thigh to have decreased 2-3 cm; LE 1--2    Time  3    Period  Weeks    Status  Achieved      PT SHORT TERM GOAL #3   Title  PT to be completing LE exercises at least daily to increase lymphatic circulation.    Time  3    Period  Weeks    Status  Achieved        PT Long Term Goals -  07/16/19 1239      PT LONG TERM GOAL #1   Title  PT to have lost 4-6  cm of volume from her thighs  ;2-4 from her LE    Time  6    Period  Weeks    Status  Partially Met      PT LONG TERM GOAL #2   Title  Pt to no longer have erythema in her LE    Time  6    Period  Weeks    Status  On-going      PT LONG TERM GOAL #3   Title  PT to have new compression garments that provide 20-3-mmHG and to be wearing on a daily basis    Time  6    Period  Weeks    Status  Achieved      PT LONG TERM GOAL #4   Title  PT to have obtained the biocompression sleeve for her pump , ( pt is going to try and see who she recieved the pump from).    Status  On-going            Plan - 07/27/19 1221    Clinical Impression Statement  Pt with conintued redness, rashy areas on Rt second and forth toes today as well as lateral aspect of foot and back of ankle.  Lt has the most on back of ankles, instep, lateral foot, top of foot and great toe.  Encouraged to make a dermatology appt as these keep presenting and current cream does not seem to be helping.  PT verbalized understanding.    Personal Factors and Comorbidities  Fitness;Past/Current Experience;Time since onset of injury/illness/exacerbation    Comorbidities  HTN,  frequent falls, Chronic lower back, ( has been having injections for years), and bilateral knee pain, Stasis dermatitis    Examination-Activity Limitations  Bathing;Caring for Others;Dressing;Hygiene/Grooming;Locomotion Level;Squat;Stairs;Stand    Examination-Participation Restrictions  Cleaning;Community Activity;Yard Work    [employer] officer  Evolving/Moderate complexity    Rehab Potential  Good    PT Frequency  3x / week    PT Duration  --   10 weeks   PT Treatment/Interventions  ADLs/Self Care Home Management;Manual techniques;Manual lymph drainage;Compression bandaging;Patient/family education;Therapeutic exercise;Other (comment)    PT Next Visit Plan  Complete 10th visit next session.  Possible d/c as soon as pump is received.    PT Home Exercise Plan  LE exercises and diaphragmic breathing       Patient will benefit from skilled therapeutic intervention in order to improve the following deficits and impairments:  Abnormal gait, Decreased activity tolerance, Decreased range of motion, Difficulty walking, Increased edema, Pain, Decreased skin integrity  Visit Diagnosis: Lymphedema, not elsewhere classified     Problem List Patient Active Problem List   Diagnosis Date Noted  . Stasis dermatitis of both legs 12/24/2018  . Leg pain, bilateral 12/24/2018  . Degenerative disc disease, lumbar 12/24/2018  . Fibromyalgia 12/24/2018  . Vitamin D deficiency 12/24/2018  . Class 3 severe obesity with serious comorbidity and body mass index (BMI) of 40.0 to 44.9 in adult ([place]) 07/23/2017  . Closed fracture of right inferior pubic ramus ([place]) 11/18/2016  . Gastroesophageal reflux disease 06/06/2016  . Recurrent major depressive disorder, in full remission ([place]) 06/06/2016  . DM type 2 with diabetic dyslipidemia ([place]) 03/06/2016  . Edema 09/14/2013  . Precordial pain 08/20/2011  . Essential hypertension, benign   . Hyperlipidemia    Teena Irani,  PTA/CLT [phone]  Teena Irani 07/27/2019, 12:25 PM  [facility] [place], [place], [address] Phone: [phone]   Fax:  [phone]  Name: Tiffany Brown MRN: [id number] Date of Birth: 08-30-1945

## 2019-07-28 ENCOUNTER — Encounter (HOSPITAL_COMMUNITY): Payer: Medicare Other | Admitting: Physical Therapy

## 2019-07-29 ENCOUNTER — Encounter (HOSPITAL_COMMUNITY): Payer: Self-pay

## 2019-07-29 ENCOUNTER — Ambulatory Visit (HOSPITAL_COMMUNITY): Payer: Medicare Other

## 2019-07-29 ENCOUNTER — Other Ambulatory Visit: Payer: Self-pay

## 2019-07-29 DIAGNOSIS — G894 Chronic pain syndrome: Secondary | ICD-10-CM | POA: Diagnosis not present

## 2019-07-29 DIAGNOSIS — M1712 Unilateral primary osteoarthritis, left knee: Secondary | ICD-10-CM | POA: Diagnosis not present

## 2019-07-29 DIAGNOSIS — M5137 Other intervertebral disc degeneration, lumbosacral region: Secondary | ICD-10-CM | POA: Diagnosis not present

## 2019-07-29 DIAGNOSIS — I890 Lymphedema, not elsewhere classified: Secondary | ICD-10-CM

## 2019-07-29 DIAGNOSIS — M47817 Spondylosis without myelopathy or radiculopathy, lumbosacral region: Secondary | ICD-10-CM | POA: Diagnosis not present

## 2019-07-29 DIAGNOSIS — Z79891 Long term (current) use of opiate analgesic: Secondary | ICD-10-CM | POA: Diagnosis not present

## 2019-07-29 DIAGNOSIS — R2681 Unsteadiness on feet: Secondary | ICD-10-CM | POA: Diagnosis not present

## 2019-07-29 DIAGNOSIS — M6281 Muscle weakness (generalized): Secondary | ICD-10-CM | POA: Diagnosis not present

## 2019-07-29 DIAGNOSIS — R296 Repeated falls: Secondary | ICD-10-CM | POA: Diagnosis not present

## 2019-07-29 DIAGNOSIS — S81801A Unspecified open wound, right lower leg, initial encounter: Secondary | ICD-10-CM | POA: Diagnosis not present

## 2019-07-29 DIAGNOSIS — Z79899 Other long term (current) drug therapy: Secondary | ICD-10-CM | POA: Diagnosis not present

## 2019-07-29 NOTE — Therapy (Addendum)
[facility] [place], [place], [address] Phone: [phone]   Fax:  [phone]  Physical Therapy Treatment  Patient Details  Name: Tiffany Brown MRN: [id number] Date of Birth: 1945-10-22 Referring Provider (PT): Carloyn Jaeger   Encounter Date: 07/29/2019  PT End of Session - 07/29/19 1014    Visit Number  23    Number of Visits  30    Date for PT Re-Evaluation  08/13/19    Authorization Type  [employer] MEdicare; PN completed at visit #47; [place] 3/40-3/70    Authorization - Visit Number    Authorization - Number of Visits     Progress Note Due on Visit 28   PT Start Time  0823    PT Stop Time  1005    PT Time Calculation (min)  102 min    Activity Tolerance  Patient tolerated treatment well    Behavior During Therapy  [facility] for tasks assessed/performed       Past Medical History:  Diagnosis Date  . Abnormal glucose   . Depression with anxiety   . Essential hypertension, benign   . Fibromyalgia   . Melanoma ([place])   . Near syncope   . Other and unspecified hyperlipidemia     Past Surgical History:  Procedure Laterality Date  . ABDOMINAL HYSTERECTOMY    . BREAST SURGERY     LEFT  . CHOLECYSTECTOMY    . ELBOW SURGERY     X's 3 Left  . RIGHT DISTAL RADIUS FRACTURE    . TOTAL KNEE ARTHROPLASTY     RIGHT    There were no vitals filed for this visit.  Subjective Assessment - 07/29/19 1012    Subjective  Pt stated her arm is really sore, received COVID shot yesterday.  Has not received the pump yet, stated it was supposed to arrive this week.    Pertinent History  HTN, frequent falls, Chronic lower back, ( has been having injections for years),  and bilateral knee pain, Stasis dermatitis    Patient Stated Goals  improve balance, feel more confident with walking    Currently in Pain?  Yes    Pain Score  6     Pain Location  Shoulder    Pain Orientation  Left    Pain Descriptors / Indicators  Aching;Sore    Pain Type   Chronic pain    Pain Radiating Towards  both legs    Pain Onset  More than a month ago    Pain Frequency  Constant    Aggravating Factors   not sure    Pain Relieving Factors  medication and Lymphedema treatments    Effect of Pain on Daily Activities  moderate            LYMPHEDEMA/ONCOLOGY QUESTIONNAIRE - 07/29/19 1301      Right Lower Extremity Lymphedema   20 cm Proximal to Suprapatella  72 cm    10 cm Proximal to Suprapatella  55.3 cm   was 59 on 10th visit 06/28/19   At Midpatella/Popliteal Crease  44.6 cm   48 on visit 10   30 cm Proximal to Floor at Lateral Plantar Foot  43 cm   was 41   20 cm Proximal to Floor at Lateral Plantar Foot  34 1   was 31.9   10 cm Proximal to Floor at Lateral Malleoli  23.6 cm   was 23.7   Circumference of ankle/heel  32.5 cm.  was 32.7   5 cm Proximal to 1st MTP Joint  24 cm   was 24.3   Across MTP Joint  23.5 cm   was 24.4   Around Proximal Great Toe  8 cm   was 8      Left Lower Extremity Lymphedema   20 cm Proximal to Suprapatella  70.5 cm   was 70   10 cm Proximal to Suprapatella  54 cm   was 56.5   At Midpatella/Popliteal Crease  43 cm   was 45.4   30 cm Proximal to Floor at Lateral Plantar Foot  38.9 cm   was 38.9   20 cm Proximal to Floor at Lateral Plantar Foot  30 cm   was 30   10 cm Proximal to Floor at Lateral Malleoli  23 cm   was 22.8   Circumference of ankle/heel  32.7 cm.   was 32.8   5 cm Proximal to 1st MTP Joint  24 cm   was 24   Across MTP Joint  22.5 cm   was 22.8   Around Proximal Great Toe  8 cm   was 8               [facility] Adult PT Treatment/Exercise - 07/29/19 0001      Manual Therapy   Manual Therapy  Manual Lymphatic Drainage (MLD);Compression Bandaging;Other (comment)    Manual therapy comments  completed seperate from all other aspects of treatment.     Manual Lymphatic Drainage (MLD)  for bilateral LE's including short neck, deep and superficial abdominal and lumbar nodes,  inguinal-axillary anastomosis bilaterally  LE completed both supine and pront.    Compression Bandaging  1/2" foam and multilayer short stretch bandaging toe to thigh B    Other Manual Therapy  MEASUREMENT               PT Short Term Goals - 07/16/19 1239      PT SHORT TERM GOAL #1   Title  Pt wound on anterior aspect of Rt LE to be healed    Time  3    Period  Weeks    Status  Achieved    Target Date  06/28/19      PT SHORT TERM GOAL #2   Title  Pt volume in thigh to have decreased 2-3 cm; LE 1--2    Time  3    Period  Weeks    Status  Achieved      PT SHORT TERM GOAL #3   Title  PT to be completing LE exercises at least daily to increase lymphatic circulation.    Time  3    Period  Weeks    Status  Achieved        PT Long Term Goals - 07/16/19 1239      PT LONG TERM GOAL #1   Title  PT to have lost 4-6  cm of volume from her thighs  ;2-4 from her LE    Time  6    Period  Weeks    Status  Partially Met      PT LONG TERM GOAL #2   Title  Pt to no longer have erythema in her LE    Time  6    Period  Weeks    Status  On-going      PT LONG TERM GOAL #3   Title  PT to have new compression garments that provide 20-3-mmHG and to be  wearing on a daily basis    Time  6    Period  Weeks    Status  Achieved      PT LONG TERM GOAL #4   Title  PT to have obtained the biocompression sleeve for her pump , ( pt is going to try and see who she recieved the pump from).    Status  On-going            Plan - 07/29/19 1015    Clinical Impression Statement  Measurements taken for 10th visit.  BLE continues to have redness.  Rashy areas on 2nd and 4th today as well as lateral aspect of foot and back of ankle.  Pt encouraged to get an apt with dermatologist, verbalized understanding.  Continued wiht manual lymphedema treatment following by multilayered short stretch bandaging.    Personal Factors and Comorbidities  Fitness;Past/Current Experience;Time since onset of  injury/illness/exacerbation    Comorbidities  HTN, frequent falls, Chronic lower back, ( has been having injections for years), and bilateral knee pain, Stasis dermatitis    Examination-Activity Limitations  Bathing;Caring for Others;Dressing;Hygiene/Grooming;Locomotion Level;Squat;Stairs;Stand    Examination-Participation Restrictions  Cleaning;Community Activity;Yard Work    [employer] officer  Evolving/Moderate complexity    Clinical Decision Making  Moderate    Rehab Potential  Good    PT Frequency  3x / week    PT Duration  --   10 weeks   PT Treatment/Interventions  ADLs/Self Care Home Management;Manual techniques;Manual lymph drainage;Compression bandaging;Patient/family education;Therapeutic exercise;Other (comment)    PT Next Visit Plan  Continue lymphedema manual and bandages.  Possible d/c as soon as pump is received.    PT Home Exercise Plan  LE exercises and diaphragmic breathing       Patient will benefit from skilled therapeutic intervention in order to improve the following deficits and impairments:  Abnormal gait, Decreased activity tolerance, Decreased range of motion, Difficulty walking, Increased edema, Pain, Decreased skin integrity  Visit Diagnosis: Lymphedema, not elsewhere classified     Problem List Patient Active Problem List   Diagnosis Date Noted  . Stasis dermatitis of both legs 12/24/2018  . Leg pain, bilateral 12/24/2018  . Degenerative disc disease, lumbar 12/24/2018  . Fibromyalgia 12/24/2018  . Vitamin D deficiency 12/24/2018  . Class 3 severe obesity with serious comorbidity and body mass index (BMI) of 40.0 to 44.9 in adult ([place]) 07/23/2017  . Closed fracture of right inferior pubic ramus ([place]) 11/18/2016  . Gastroesophageal reflux disease 06/06/2016  . Recurrent major depressive disorder, in full remission ([place]) 06/06/2016  . DM type 2 with diabetic dyslipidemia ([place]) 03/06/2016  . Edema 09/14/2013  . Precordial pain  08/20/2011  . Essential hypertension, benign   . Hyperlipidemia    Ihor Austin, LPTA/CLT; CBIS [phone]  Aldona Lento 07/29/2019, 1:09 PM  [facility] [address] [place], [place], [address] Phone: [phone]   Fax:  [phone]  Name: Tiffany Brown MRN: [id number] Date of Birth: Oct 03, 1945

## 2019-07-30 ENCOUNTER — Telehealth (HOSPITAL_COMMUNITY): Payer: Self-pay

## 2019-07-30 ENCOUNTER — Ambulatory Visit (HOSPITAL_COMMUNITY): Payer: Medicare Other

## 2019-07-30 NOTE — Telephone Encounter (Signed)
pt called to cx today's appt due to she is not feeling well 

## 2019-08-02 ENCOUNTER — Other Ambulatory Visit: Payer: Self-pay

## 2019-08-02 ENCOUNTER — Ambulatory Visit (HOSPITAL_COMMUNITY): Payer: Medicare Other | Admitting: Physical Therapy

## 2019-08-02 DIAGNOSIS — I890 Lymphedema, not elsewhere classified: Secondary | ICD-10-CM

## 2019-08-02 DIAGNOSIS — R296 Repeated falls: Secondary | ICD-10-CM | POA: Diagnosis not present

## 2019-08-02 DIAGNOSIS — R2681 Unsteadiness on feet: Secondary | ICD-10-CM | POA: Diagnosis not present

## 2019-08-02 DIAGNOSIS — M6281 Muscle weakness (generalized): Secondary | ICD-10-CM | POA: Diagnosis not present

## 2019-08-02 DIAGNOSIS — S81801A Unspecified open wound, right lower leg, initial encounter: Secondary | ICD-10-CM | POA: Diagnosis not present

## 2019-08-02 NOTE — Therapy (Signed)
[facility] [address] [place], [place], [address] Phone: [phone]   Fax:  [phone]  Physical Therapy Treatment  Patient Details  Name: Tiffany Brown MRN: [id number] Date of Birth: Oct 06, 1945 Referring Provider (PT): Carloyn Jaeger   Encounter Date: 08/02/2019  PT End of Session - 08/02/19 1336    Visit Number  24    Number of Visits  30    Date for PT Re-Evaluation  08/13/19    Authorization Type  [employer] MEdicare; PN completed at visit #09; cert 4/07-6/80    Progress Note Due on Visit  28    PT Start Time  1005    PT Stop Time  1120    PT Time Calculation (min)  75 min    Activity Tolerance  Patient tolerated treatment well    Behavior During Therapy  [facility] for tasks assessed/performed       Past Medical History:  Diagnosis Date  . Abnormal glucose   . Depression with anxiety   . Essential hypertension, benign   . Fibromyalgia   . Melanoma ([place])   . Near syncope   . Other and unspecified hyperlipidemia     Past Surgical History:  Procedure Laterality Date  . ABDOMINAL HYSTERECTOMY    . BREAST SURGERY     LEFT  . CHOLECYSTECTOMY    . ELBOW SURGERY     X's 3 Left  . RIGHT DISTAL RADIUS FRACTURE    . TOTAL KNEE ARTHROPLASTY     RIGHT    There were no vitals filed for this visit.  Subjective Assessment - 08/02/19 1335    Subjective  pt states she did not get her pump. Therapist text Corene Cornea and he is to deliver it tomorrrow at her home.    Currently in Pain?  No/denies                       [facility] Adult PT Treatment/Exercise - 08/02/19 0001      Manual Therapy   Manual Therapy  Manual Lymphatic Drainage (MLD);Compression Bandaging;Other (comment)    Manual therapy comments  completed seperate from all other aspects of treatment.     Manual Lymphatic Drainage (MLD)  for bilateral LE's including short neck, deep and superficial abdominal and lumbar nodes, inguinal-axillary anastomosis bilaterally  LE  completed both supine and pront.    Compression Bandaging  1/2" foam and multilayer short stretch bandaging toe to thigh B               PT Short Term Goals - 07/16/19 1239      PT SHORT TERM GOAL #1   Title  Pt wound on anterior aspect of Rt LE to be healed    Time  3    Period  Weeks    Status  Achieved    Target Date  06/28/19      PT SHORT TERM GOAL #2   Title  Pt volume in thigh to have decreased 2-3 cm; LE 1--2    Time  3    Period  Weeks    Status  Achieved      PT SHORT TERM GOAL #3   Title  PT to be completing LE exercises at least daily to increase lymphatic circulation.    Time  3    Period  Weeks    Status  Achieved        PT Long Term Goals - 07/16/19 1239  PT LONG TERM GOAL #1   Title  PT to have lost 4-6  cm of volume from her thighs  ;2-4 from her LE    Time  6    Period  Weeks    Status  Partially Met      PT LONG TERM GOAL #2   Title  Pt to no longer have erythema in her LE    Time  6    Period  Weeks    Status  On-going      PT LONG TERM GOAL #3   Title  PT to have new compression garments that provide 20-3-mmHG and to be wearing on a daily basis    Time  6    Period  Weeks    Status  Achieved      PT LONG TERM GOAL #4   Title  PT to have obtained the biocompression sleeve for her pump , ( pt is going to try and see who she recieved the pump from).    Status  On-going            Plan - 08/02/19 1337    Clinical Impression Statement  Pt to receive her new pump tomorrow, however states she's been using her old one as well.  Redness on feet is almost nearly gone, however the red circles on thighs have not changed (worsened or improved).  STates she plans on make a dermatologist appt for this.  Toes are very dry and Lt great toe is cracked open.  Did not bandage toes today to allow pateint to better moisturize her toes.  Informed of Healing hands product for feet or trying the carmex used on lips that is medicated to help improve  and prevent a wound.  Encouraged to keep socks over toes to help with moisture.    Personal Factors and Comorbidities  Fitness;Past/Current Experience;Time since onset of injury/illness/exacerbation    Comorbidities  HTN, frequent falls, Chronic lower back, ( has been having injections for years), and bilateral knee pain, Stasis dermatitis    Examination-Activity Limitations  Bathing;Caring for Others;Dressing;Hygiene/Grooming;Locomotion Level;Squat;Stairs;Stand    Examination-Participation Restrictions  Cleaning;Community Activity;Yard Work    [employer]  Evolving/Moderate complexity    Rehab Potential  Good    PT Frequency  3x / week    PT Duration  --   10 weeks   PT Treatment/Interventions  ADLs/Self Care Home Management;Manual techniques;Manual lymph drainage;Compression bandaging;Patient/family education;Therapeutic exercise;Other (comment)    PT Next Visit Plan  Continue lymphedema manual and bandages.  Possible d/c as soon as pump is received.    PT Home Exercise Plan  LE exercises and diaphragmic breathing       Patient will benefit from skilled therapeutic intervention in order to improve the following deficits and impairments:  Abnormal gait, Decreased activity tolerance, Decreased range of motion, Difficulty walking, Increased edema, Pain, Decreased skin integrity  Visit Diagnosis: Lymphedema, not elsewhere classified     Problem List Patient Active Problem List   Diagnosis Date Noted  . Stasis dermatitis of both legs 12/24/2018  . Leg pain, bilateral 12/24/2018  . Degenerative disc disease, lumbar 12/24/2018  . Fibromyalgia 12/24/2018  . Vitamin D deficiency 12/24/2018  . Class 3 severe obesity with serious comorbidity and body mass index (BMI) of 40.0 to 44.9 in adult ([place]) 07/23/2017  . Closed fracture of right inferior pubic ramus ([place]) 11/18/2016  . Gastroesophageal reflux disease 06/06/2016  . Recurrent major depressive disorder, in full  remission ([place])  06/06/2016  . DM type 2 with diabetic dyslipidemia ([place]) 03/06/2016  . Edema 09/14/2013  . Precordial pain 08/20/2011  . Essential hypertension, benign   . Hyperlipidemia    Teena Irani, PTA/CLT [phone]  Teena Irani 08/02/2019, 1:40 PM  [facility] [address] [place], [place], [address] Phone: [phone]   Fax:  [phone]  Name: Tiffany Brown MRN: [id number] Date of Birth: 03/15/1946

## 2019-08-03 DIAGNOSIS — I890 Lymphedema, not elsewhere classified: Secondary | ICD-10-CM | POA: Diagnosis not present

## 2019-08-04 ENCOUNTER — Ambulatory Visit (HOSPITAL_COMMUNITY): Payer: Medicare Other

## 2019-08-04 ENCOUNTER — Encounter (HOSPITAL_COMMUNITY): Payer: Self-pay

## 2019-08-04 ENCOUNTER — Other Ambulatory Visit: Payer: Self-pay

## 2019-08-04 DIAGNOSIS — S81801A Unspecified open wound, right lower leg, initial encounter: Secondary | ICD-10-CM | POA: Diagnosis not present

## 2019-08-04 DIAGNOSIS — R296 Repeated falls: Secondary | ICD-10-CM | POA: Diagnosis not present

## 2019-08-04 DIAGNOSIS — I890 Lymphedema, not elsewhere classified: Secondary | ICD-10-CM | POA: Diagnosis not present

## 2019-08-04 DIAGNOSIS — M6281 Muscle weakness (generalized): Secondary | ICD-10-CM | POA: Diagnosis not present

## 2019-08-04 DIAGNOSIS — R2681 Unsteadiness on feet: Secondary | ICD-10-CM | POA: Diagnosis not present

## 2019-08-04 NOTE — Therapy (Addendum)
[facility] [place], [place], [address] Phone: [phone]   Fax:  [phone]  Physical Therapy Treatment  Patient Details  Name: Tiffany Brown MRN: [id number] Date of Birth: Mar 24, 1946 Referring Provider (PT): Carloyn Jaeger  PHYSICAL THERAPY DISCHARGE SUMMARY  Visits from Start of Care: 25  Current functional level related to goals / functional outcomes: See below; Pt has decongest to minimal volume, has her pump and compression garments.    Remaining deficits: None    Education / Equipment: Pump and garments   Plan: Patient agrees to discharge.  Patient goals were not met. Patient is being discharged due to meeting the stated rehab goals.  ?????       Encounter Date: 08/04/2019  PT End of Session - 08/04/19 1118    Visit Number  25    Number of Visits  25   Date for PT Re-Evaluation  08/13/19    Authorization Type  [employer] MEdicare; PN completed at visit #88; [place] 8/75-7/97    Authorization - Visit Number  11    Authorization - Number of Visits  20    Progress Note Due on Visit  28    PT Start Time  1005    PT Stop Time  1100    PT Time Calculation (min)  55 min    Activity Tolerance  Patient tolerated treatment well    Behavior During Therapy  WFL for tasks assessed/performed       Past Medical History:  Diagnosis Date  . Abnormal glucose   . Depression with anxiety   . Essential hypertension, benign   . Fibromyalgia   . Melanoma ([place])   . Near syncope   . Other and unspecified hyperlipidemia     Past Surgical History:  Procedure Laterality Date  . ABDOMINAL HYSTERECTOMY    . BREAST SURGERY     LEFT  . CHOLECYSTECTOMY    . ELBOW SURGERY     X's 3 Left  . RIGHT DISTAL RADIUS FRACTURE    . TOTAL KNEE ARTHROPLASTY     RIGHT    There were no vitals filed for this visit.  Subjective Assessment - 08/04/19 1106    Subjective  Pt stated she received her pump yesterday, she arrived wearing  compression hose.  Hasnt called dermatologist yet, does have plans to call as has 2 spots on her chest she feels need to looked at.    Pertinent History  HTN, frequent falls, Chronic lower back, ( has been having injections for years),  and bilateral knee pain, Stasis dermatitis    Currently in Pain?  Yes    Pain Score  4     Pain Location  Knee    Pain Orientation  Left    Pain Descriptors / Indicators  Aching;Sore    Pain Type  Chronic pain    Pain Onset  More than a month ago    Pain Frequency  Constant    Aggravating Factors   not sure    Pain Relieving Factors  medication and lymphedema treatment    Effect of Pain on Daily Activities  moderate            LYMPHEDEMA/ONCOLOGY QUESTIONNAIRE - 08/04/19 1110      Right Lower Extremity Lymphedema   20 cm Proximal to Suprapatella  72.5 cm    10 cm Proximal to Suprapatella  55.2 cm   was 59 on 10th visit 06/28/19   At [place]  45 cm   was 48 visit 10   30 cm Proximal to Floor at Lateral Plantar Foot  43 cm   was 41cm visit 10   20 cm Proximal to Floor at Lateral Plantar Foot  33.7 1   was 31.9   10 cm Proximal to Floor at Lateral Malleoli  24 cm   was 23.7 visit 10   Circumference of ankle/heel  32 cm.   was 32.7cm visit 10   5 cm Proximal to 1st MTP Joint  24.2 cm   was 24.3 visit 10   Across MTP Joint  23.8 cm   was 24.4cm visit 10   Around Proximal Great Toe  8 cm   was 8 cm visit 10     Left Lower Extremity Lymphedema   20 cm Proximal to Suprapatella  70.5 cm   was 70 visit 10 06/28/19   10 cm Proximal to Suprapatella  53.8 cm   was 56.5 visit 10 06/28/19   At Midpatella/Popliteal Crease  42 cm   was 45.4cm visit 10 06/28/19   30 cm Proximal to Floor at Lateral Plantar Foot  38.9 cm   was 38.9 visit 10 06/28/19   20 cm Proximal to Floor at Lateral Plantar Foot  30.4 cm   was 30   10 cm Proximal to Floor at Lateral Malleoli  23 cm   was 22.8   Circumference of ankle/heel  32.5 cm.   was 32.8  visit 10 06/28/19   5 cm Proximal to 1st MTP Joint  24 cm   was 24 visit 10 06/28/19   Across MTP Joint  22.8 cm   was 22.8 visit 10 06/28/19   Around Proximal Great Toe  8 cm   was 8 visit 10 06/28/19               [facility] Adult PT Treatment/Exercise - 08/04/19 0001      Manual Therapy   Manual Therapy  Manual Lymphatic Drainage (MLD);Compression Bandaging;Other (comment)    Manual therapy comments  completed seperate from all other aspects of treatment.     Manual Lymphatic Drainage (MLD)  for bilateral LE's including short neck, deep and superficial abdominal and lumbar nodes, inguinal-axillary anastomosis bilaterally  LE completed both supine and pront.    Compression Bandaging  1/2" foam and multilayer short stretch bandaging toe to thigh B    Other Manual Therapy  Measurements complete             PT Education - 08/04/19 1116    Education Details  Reviewed self care massage, discussed importance of compliance wiht HEP, manual/pump, wearing hose consistent.  Encouraged pt to save foam/bandages in case of flare up.  Pt stated she has plans to make dermatologist apt    Person(s) Educated  Patient    Methods  Explanation    Comprehension  Verbalized understanding;Returned demonstration       PT Short Term Goals - 07/16/19 1239      PT SHORT TERM GOAL #1   Title  Pt wound on anterior aspect of Rt LE to be healed    Time  3    Period  Weeks    Status  Achieved    Target Date  06/28/19      PT SHORT TERM GOAL #2   Title  Pt volume in thigh to have decreased 2-3 cm; LE 1--2    Time  3    Period  Weeks  Status  Achieved      PT SHORT TERM GOAL #3   Title  PT to be completing LE exercises at least daily to increase lymphatic circulation.    Time  3    Period  Weeks    Status  Achieved        PT Long Term Goals - 08/04/19 1249      PT LONG TERM GOAL #1   Title  PT to have lost 4-6  cm of volume from her thighs  ;2-4 from her LE    Status  Partially Met       PT LONG TERM GOAL #2   Title  Pt to no longer have erythema in her LE    Status  Not Met      PT LONG TERM GOAL #3   Title  PT to have new compression garments that provide 20-3-mmHG and to be wearing on a daily basis    Status  Achieved      PT LONG TERM GOAL #4   Title  PT to have obtained the biocompression sleeve for her pump , ( pt is going to try and see who she recieved the pump from).    Status  Achieved            Plan - 08/04/19 1119    Clinical Impression Statement  Pt reports receiving pump at home yesterday, reports it feels good.  Pt able to don/doff hose with no assistance required.  This session completed measurements and manual lymphedema treatment including short neck, superficial/deep abdominal, and anterior/posterior BLE.  Discussed plans for discharge, pt able to verbalize understanding.  Pt reoprts plans tomake dermatologist apt to address red rings on BLE as well as spots on her chest.  Encouraged pt to keep LE moisturized as has cracks on toes.    Personal Factors and Comorbidities  Fitness;Past/Current Experience;Time since onset of injury/illness/exacerbation    Comorbidities  HTN, frequent falls, Chronic lower back, ( has been having injections for years), and bilateral knee pain, Stasis dermatitis    Examination-Activity Limitations  Bathing;Caring for Others;Dressing;Hygiene/Grooming;Locomotion Level;Squat;Stairs;Stand    Examination-Participation Restrictions  Cleaning;Community Activity;Yard Work    [employer] officer  Evolving/Moderate complexity    Clinical Decision Making  Moderate    Rehab Potential  Good    PT Frequency  3x / week    PT Duration  --   10 weeks   PT Treatment/Interventions  ADLs/Self Care Home Management;Manual techniques;Manual lymph drainage;Compression bandaging;Patient/family education;Therapeutic exercise;Other (comment)    PT Next Visit Plan  DC to HEP,    PT Home Exercise Plan  LE exercises and  diaphragmic breathing       Patient will benefit from skilled therapeutic intervention in order to improve the following deficits and impairments:  Abnormal gait, Decreased activity tolerance, Decreased range of motion, Difficulty walking, Increased edema, Pain, Decreased skin integrity  Visit Diagnosis: Lymphedema, not elsewhere classified     Problem List Patient Active Problem List   Diagnosis Date Noted  . Stasis dermatitis of both legs 12/24/2018  . Leg pain, bilateral 12/24/2018  . Degenerative disc disease, lumbar 12/24/2018  . Fibromyalgia 12/24/2018  . Vitamin D deficiency 12/24/2018  . Class 3 severe obesity with serious comorbidity and body mass index (BMI) of 40.0 to 44.9 in adult ([place]) 07/23/2017  . Closed fracture of right inferior pubic ramus ([place]) 11/18/2016  . Gastroesophageal reflux disease 06/06/2016  . Recurrent major depressive disorder, in full remission ([place]) 06/06/2016  .  DM type 2 with diabetic dyslipidemia ([place]) 03/06/2016  . Edema 09/14/2013  . Precordial pain 08/20/2011  . Essential hypertension, benign   . Hyperlipidemia    Ihor Austin, LPTA/CLT; [place]  Rayetta Humphrey, PT CLT [phone] 08/04/2019, 12:51 PM  [facility] [address] [place], [place], [address] Phone: [phone]   Fax:  [phone]  Name: Tiffany Brown MRN: [id number] Date of Birth: Apr 23, 1946

## 2019-08-06 ENCOUNTER — Ambulatory Visit (HOSPITAL_COMMUNITY): Payer: Medicare Other

## 2019-08-09 ENCOUNTER — Ambulatory Visit (HOSPITAL_COMMUNITY): Payer: Medicare Other | Admitting: Physical Therapy

## 2019-08-10 ENCOUNTER — Encounter (HOSPITAL_COMMUNITY): Payer: Medicare Other | Admitting: Physical Therapy

## 2019-08-11 ENCOUNTER — Encounter (HOSPITAL_COMMUNITY): Payer: Medicare Other | Admitting: Physical Therapy

## 2019-08-12 ENCOUNTER — Encounter (HOSPITAL_COMMUNITY): Payer: Medicare Other

## 2019-08-12 DIAGNOSIS — M1712 Unilateral primary osteoarthritis, left knee: Secondary | ICD-10-CM | POA: Diagnosis not present

## 2019-08-13 ENCOUNTER — Encounter (HOSPITAL_COMMUNITY): Payer: Medicare Other | Admitting: Physical Therapy

## 2019-08-16 DIAGNOSIS — R208 Other disturbances of skin sensation: Secondary | ICD-10-CM | POA: Diagnosis not present

## 2019-08-16 DIAGNOSIS — L820 Inflamed seborrheic keratosis: Secondary | ICD-10-CM | POA: Diagnosis not present

## 2019-08-16 DIAGNOSIS — C44529 Squamous cell carcinoma of skin of other part of trunk: Secondary | ICD-10-CM | POA: Diagnosis not present

## 2019-08-16 DIAGNOSIS — I8311 Varicose veins of right lower extremity with inflammation: Secondary | ICD-10-CM | POA: Diagnosis not present

## 2019-08-16 DIAGNOSIS — I8312 Varicose veins of left lower extremity with inflammation: Secondary | ICD-10-CM | POA: Diagnosis not present

## 2019-08-16 DIAGNOSIS — L570 Actinic keratosis: Secondary | ICD-10-CM | POA: Diagnosis not present

## 2019-08-23 DIAGNOSIS — C44529 Squamous cell carcinoma of skin of other part of trunk: Secondary | ICD-10-CM | POA: Diagnosis not present

## 2019-08-26 DIAGNOSIS — M5137 Other intervertebral disc degeneration, lumbosacral region: Secondary | ICD-10-CM | POA: Diagnosis not present

## 2019-08-26 DIAGNOSIS — M47817 Spondylosis without myelopathy or radiculopathy, lumbosacral region: Secondary | ICD-10-CM | POA: Diagnosis not present

## 2019-08-26 DIAGNOSIS — M1712 Unilateral primary osteoarthritis, left knee: Secondary | ICD-10-CM | POA: Diagnosis not present

## 2019-08-26 DIAGNOSIS — G894 Chronic pain syndrome: Secondary | ICD-10-CM | POA: Diagnosis not present

## 2019-09-30 DIAGNOSIS — M5137 Other intervertebral disc degeneration, lumbosacral region: Secondary | ICD-10-CM | POA: Diagnosis not present

## 2019-09-30 DIAGNOSIS — G894 Chronic pain syndrome: Secondary | ICD-10-CM | POA: Diagnosis not present

## 2019-09-30 DIAGNOSIS — M47817 Spondylosis without myelopathy or radiculopathy, lumbosacral region: Secondary | ICD-10-CM | POA: Diagnosis not present

## 2019-09-30 DIAGNOSIS — M1712 Unilateral primary osteoarthritis, left knee: Secondary | ICD-10-CM | POA: Diagnosis not present

## 2019-10-07 DIAGNOSIS — L905 Scar conditions and fibrosis of skin: Secondary | ICD-10-CM | POA: Diagnosis not present

## 2019-10-07 DIAGNOSIS — D485 Neoplasm of uncertain behavior of skin: Secondary | ICD-10-CM | POA: Diagnosis not present

## 2019-10-07 DIAGNOSIS — Z85828 Personal history of other malignant neoplasm of skin: Secondary | ICD-10-CM | POA: Diagnosis not present

## 2019-10-07 DIAGNOSIS — C44519 Basal cell carcinoma of skin of other part of trunk: Secondary | ICD-10-CM | POA: Diagnosis not present

## 2019-10-11 DIAGNOSIS — Z1211 Encounter for screening for malignant neoplasm of colon: Secondary | ICD-10-CM | POA: Diagnosis not present

## 2019-10-11 DIAGNOSIS — Z1212 Encounter for screening for malignant neoplasm of rectum: Secondary | ICD-10-CM | POA: Diagnosis not present

## 2019-10-14 ENCOUNTER — Other Ambulatory Visit: Payer: Medicare Other

## 2019-10-14 ENCOUNTER — Other Ambulatory Visit: Payer: Self-pay

## 2019-10-14 DIAGNOSIS — E1169 Type 2 diabetes mellitus with other specified complication: Secondary | ICD-10-CM

## 2019-10-14 DIAGNOSIS — I10 Essential (primary) hypertension: Secondary | ICD-10-CM

## 2019-10-14 DIAGNOSIS — E782 Mixed hyperlipidemia: Secondary | ICD-10-CM

## 2019-10-14 DIAGNOSIS — K219 Gastro-esophageal reflux disease without esophagitis: Secondary | ICD-10-CM

## 2019-10-14 DIAGNOSIS — E785 Hyperlipidemia, unspecified: Secondary | ICD-10-CM | POA: Diagnosis not present

## 2019-10-14 DIAGNOSIS — F3342 Major depressive disorder, recurrent, in full remission: Secondary | ICD-10-CM

## 2019-10-14 LAB — BAYER DCA HB A1C WAIVED: HB A1C (BAYER DCA - WAIVED): 6.5 % (ref <7.0)

## 2019-10-15 LAB — CBC WITH DIFFERENTIAL/PLATELET
Basophils Absolute: 0 10*3/uL (ref 0.0–0.2)
Basos: 0 %
EOS (ABSOLUTE): 0.2 10*3/uL (ref 0.0–0.4)
Eos: 2 %
Hematocrit: 40.3 % (ref 34.0–46.6)
Hemoglobin: 13.6 g/dL (ref 11.1–15.9)
Immature Grans (Abs): 0 10*3/uL (ref 0.0–0.1)
Immature Granulocytes: 0 %
Lymphocytes Absolute: 2.5 10*3/uL (ref 0.7–3.1)
Lymphs: 29 %
MCH: 31.5 pg (ref 26.6–33.0)
MCHC: 33.7 g/dL (ref 31.5–35.7)
MCV: 93 fL (ref 79–97)
Monocytes Absolute: 0.5 10*3/uL (ref 0.1–0.9)
Monocytes: 5 %
Neutrophils Absolute: 5.5 10*3/uL (ref 1.4–7.0)
Neutrophils: 64 %
Platelets: 204 10*3/uL (ref 150–450)
RBC: 4.32 x10E6/uL (ref 3.77–5.28)
RDW: 13.4 % (ref 11.7–15.4)
WBC: 8.8 10*3/uL (ref 3.4–10.8)

## 2019-10-15 LAB — CMP14+EGFR
ALT: 33 IU/L — ABNORMAL HIGH (ref 0–32)
AST: 32 IU/L (ref 0–40)
Albumin/Globulin Ratio: 1.6 (ref 1.2–2.2)
Albumin: 4.2 g/dL (ref 3.7–4.7)
Alkaline Phosphatase: 129 IU/L — ABNORMAL HIGH (ref 48–121)
BUN/Creatinine Ratio: 18 (ref 12–28)
BUN: 14 mg/dL (ref 8–27)
Bilirubin Total: 0.3 mg/dL (ref 0.0–1.2)
CO2: 28 mmol/L (ref 20–29)
Calcium: 9.2 mg/dL (ref 8.7–10.3)
Chloride: 101 mmol/L (ref 96–106)
Creatinine, Ser: 0.77 mg/dL (ref 0.57–1.00)
GFR calc Af Amer: 88 mL/min/{1.73_m2} (ref >59)
GFR calc non Af Amer: 76 mL/min/{1.73_m2} (ref >59)
Globulin, Total: 2.6 g/dL (ref 1.5–4.5)
Glucose: 158 mg/dL — ABNORMAL HIGH (ref 65–99)
Potassium: 4.1 mmol/L (ref 3.5–5.2)
Sodium: 143 mmol/L (ref 134–144)
Total Protein: 6.8 g/dL (ref 6.0–8.5)

## 2019-10-15 LAB — LIPID PANEL
Chol/HDL Ratio: 3.4 ratio (ref 0.0–4.4)
Cholesterol, Total: 186 mg/dL (ref 100–199)
HDL: 54 mg/dL (ref >39)
LDL Chol Calc (NIH): 104 mg/dL — ABNORMAL HIGH (ref 0–99)
Triglycerides: 159 mg/dL — ABNORMAL HIGH (ref 0–149)
VLDL Cholesterol Cal: 28 mg/dL (ref 5–40)

## 2019-10-15 LAB — TSH: TSH: 4.21 u[IU]/mL (ref 0.450–4.500)

## 2019-10-18 DIAGNOSIS — M5136 Other intervertebral disc degeneration, lumbar region: Secondary | ICD-10-CM | POA: Diagnosis not present

## 2019-10-18 DIAGNOSIS — M47817 Spondylosis without myelopathy or radiculopathy, lumbosacral region: Secondary | ICD-10-CM | POA: Diagnosis not present

## 2019-10-20 ENCOUNTER — Encounter: Payer: Self-pay | Admitting: Family Medicine

## 2019-10-20 ENCOUNTER — Other Ambulatory Visit: Payer: Self-pay

## 2019-10-20 ENCOUNTER — Ambulatory Visit (INDEPENDENT_AMBULATORY_CARE_PROVIDER_SITE_OTHER): Payer: Medicare Other | Admitting: Family Medicine

## 2019-10-20 VITALS — BP 148/74 | HR 77 | Temp 98.2°F | Ht 65.5 in | Wt 271.0 lb

## 2019-10-20 DIAGNOSIS — E785 Hyperlipidemia, unspecified: Secondary | ICD-10-CM | POA: Diagnosis not present

## 2019-10-20 DIAGNOSIS — E1169 Type 2 diabetes mellitus with other specified complication: Secondary | ICD-10-CM | POA: Diagnosis not present

## 2019-10-20 DIAGNOSIS — E782 Mixed hyperlipidemia: Secondary | ICD-10-CM | POA: Diagnosis not present

## 2019-10-20 DIAGNOSIS — I10 Essential (primary) hypertension: Secondary | ICD-10-CM | POA: Diagnosis not present

## 2019-10-20 NOTE — Progress Notes (Signed)
BP (!) 148/74   Pulse 77   Temp 98.2 F (36.8 C)   Ht 5' 5.5" (1.664 m)   Wt 271 lb (122.9 kg)   SpO2 97%   BMI 44.41 kg/m    Subjective:   Patient ID: Tiffany Brown, female    DOB: June 27, 1945, 74 y.o.   MRN: [id number]  HPI: Tiffany Brown is a 74 y.o. female presenting on 10/20/2019 for Medical Management of Chronic Issues, Diabetes, Hyperlipidemia, and Hypertension   HPI Type 2 diabetes mellitus Patient comes in today for recheck of his diabetes. Patient has been currently taking no medication and is diet controlled and A1c is back down to 6.5 this time. Patient is currently on an ACE inhibitor/ARB. Patient has not seen an ophthalmologist this year. Patient denies any issues with their feet. The symptom started onset as an adult hypertension and cholesterol morbid obesity ARE RELATED TO DM   Hypertension Patient is currently on losartan and amlodipine, and their blood pressure today is 148/74. Patient denies any lightheadedness or dizziness. Patient denies headaches, blurred vision, chest pains, shortness of breath, or weakness. Denies any side effects from medication and is content with current medication.   Hyperlipidemia Patient is coming in for recheck of his hyperlipidemia. The patient is currently taking pravastatin. They deny any issues with myalgias or history of liver damage from it. They deny any focal numbness or weakness or chest pain.   Sleep issues and fatigue Patient's blood work all looks good including her thyroid.  Based on what she is saying it sounds like she has hypersomnolence during the day and difficulty sleeping at night and sporadic sleep at night and nonrefreshing sleep in the morning and then just feels fatigued with no energy all day.  Relevant past medical, surgical, family and social history reviewed and updated as indicated. Interim medical history since our last visit reviewed. Allergies and medications reviewed and updated.  Review of  Systems  Constitutional: Negative for chills and fever.  Eyes: Negative for visual disturbance.  Respiratory: Negative for chest tightness and shortness of breath.   Cardiovascular: Negative for chest pain and leg swelling.  Musculoskeletal: Negative for back pain and gait problem.  Skin: Negative for rash.  Neurological: Negative for light-headedness and headaches.  Psychiatric/Behavioral: Negative for agitation and behavioral problems.  All other systems reviewed and are negative.   Per HPI unless specifically indicated above   Allergies as of 10/20/2019      Reactions   Other       Medication List       Accurate as of October 20, 2019  2:37 PM. If you have any questions, ask your nurse or doctor.        amLODipine 5 MG tablet Commonly known as: NORVASC Take 1 tablet (5 mg total) by mouth daily.   DULoxetine 60 MG capsule Commonly known as: CYMBALTA Take 2 capsules (120 mg total) by mouth daily.   fluticasone 50 MCG/ACT nasal spray Commonly known as: FLONASE Place 2 sprays into the nose daily as needed (allergies).   furosemide 40 MG tablet Commonly known as: LASIX Take 1 tablet (40 mg total) by mouth 2 (two) times daily as needed.   gabapentin 100 MG capsule Commonly known as: NEURONTIN Take 100 mg by mouth 3 (three) times daily.   HYDROcodone-acetaminophen 10-325 MG tablet Commonly known as: NORCO Take 1 tablet by mouth every 6 (six) hours as needed for moderate pain.   losartan 100 MG tablet Commonly  known as: COZAAR Take 1 tablet (100 mg total) by mouth daily.   meloxicam 7.5 MG tablet Commonly known as: MOBIC Take 1 tablet (7.5 mg total) by mouth daily.   omeprazole 40 MG capsule Commonly known as: PRILOSEC Take 40 mg by mouth every morning.   Potassium Chloride ER 20 MEQ Tbcr   pravastatin 80 MG tablet Commonly known as: PRAVACHOL Take 1 tablet (80 mg total) by mouth daily.   Vitamin D (Ergocalciferol) 1.25 MG (50000 UNIT) Caps  capsule Commonly known as: DRISDOL Take 1 capsule (50,000 Units total) by mouth every 7 (seven) days.        Objective:   BP (!) 148/74   Pulse 77   Temp 98.2 F (36.8 C)   Ht 5' 5.5" (1.664 m)   Wt 271 lb (122.9 kg)   SpO2 97%   BMI 44.41 kg/m   Wt Readings from Last 3 Encounters:  10/20/19 271 lb (122.9 kg)  07/15/19 270 lb (122.5 kg)  03/31/19 265 lb 9.6 oz (120.5 kg)    Physical Exam Vitals and nursing note reviewed.  Constitutional:      General: She is not in acute distress.    Appearance: She is well-developed. She is not diaphoretic.  Eyes:     Conjunctiva/sclera: Conjunctivae normal.  Cardiovascular:     Rate and Rhythm: Normal rate and regular rhythm.     Heart sounds: Normal heart sounds. No murmur heard.   Pulmonary:     Effort: Pulmonary effort is normal. No respiratory distress.     Breath sounds: Normal breath sounds. No wheezing.  Skin:    General: Skin is warm and dry.     Findings: No rash.  Neurological:     Mental Status: She is alert and oriented to person, place, and time.     Coordination: Coordination normal.  Psychiatric:        Behavior: Behavior normal.     Results for orders placed or performed in visit on 10/14/19  Bayer DCA Hb A1c Waived  Result Value Ref Range   HB A1C (BAYER DCA - WAIVED) 6.5 <7.0 %  CMP14+EGFR  Result Value Ref Range   Glucose 158 (H) 65 - 99 mg/dL   BUN 14 8 - 27 mg/dL   Creatinine, Ser 0.77 0.57 - 1.00 mg/dL   GFR calc non Af Amer 76 >59 mL/min/1.73   GFR calc Af Amer 88 >59 mL/min/1.73   BUN/Creatinine Ratio 18 12 - 28   Sodium 143 134 - 144 mmol/L   Potassium 4.1 3.5 - 5.2 mmol/L   Chloride 101 96 - 106 mmol/L   CO2 28 20 - 29 mmol/L   Calcium 9.2 8.7 - 10.3 mg/dL   Total Protein 6.8 6.0 - 8.5 g/dL   Albumin 4.2 3.7 - 4.7 g/dL   Globulin, Total 2.6 1.5 - 4.5 g/dL   Albumin/Globulin Ratio 1.6 1.2 - 2.2   Bilirubin Total 0.3 0.0 - 1.2 mg/dL   Alkaline Phosphatase 129 (H) 48 - 121 IU/L   AST 32  0 - 40 IU/L   ALT 33 (H) 0 - 32 IU/L  TSH  Result Value Ref Range   TSH 4.210 0.450 - 4.500 uIU/mL  Lipid panel  Result Value Ref Range   Cholesterol, Total 186 100 - 199 mg/dL   Triglycerides 159 (H) 0 - 149 mg/dL   HDL 54 >39 mg/dL   VLDL Cholesterol Cal 28 5 - 40 mg/dL   LDL Chol Calc (NIH) 104 (  H) 0 - 99 mg/dL   Chol/HDL Ratio 3.4 0.0 - 4.4 ratio  CBC with Differential/Platelet  Result Value Ref Range   WBC 8.8 3.4 - 10.8 x10E3/uL   RBC 4.32 3.77 - 5.28 x10E6/uL   Hemoglobin 13.6 11.1 - 15.9 g/dL   Hematocrit 40.3 34.0 - 46.6 %   MCV 93 79 - 97 fL   MCH 31.5 26.6 - 33.0 pg   MCHC 33.7 31 - 35 g/dL   RDW 13.4 11.7 - 15.4 %   Platelets 204 150 - 450 x10E3/uL   Neutrophils 64 Not Estab. %   Lymphs 29 Not Estab. %   Monocytes 5 Not Estab. %   Eos 2 Not Estab. %   Basos 0 Not Estab. %   Neutrophils Absolute 5.5 1 - 7 x10E3/uL   Lymphocytes Absolute 2.5 0 - 3 x10E3/uL   Monocytes Absolute 0.5 0 - 0 x10E3/uL   EOS (ABSOLUTE) 0.2 0.0 - 0.4 x10E3/uL   Basophils Absolute 0.0 0 - 0 x10E3/uL   Immature Granulocytes 0 Not Estab. %   Immature Grans (Abs) 0.0 0.0 - 0.1 x10E3/uL    Assessment & Plan:   Problem List Items Addressed This Visit      Cardiovascular and Mediastinum   Essential hypertension, benign - Primary     Endocrine   DM type 2 with diabetic dyslipidemia ([place])     Other   Hyperlipidemia      A1c looks good and cholesterol is right on the border, not concerned about any of the other labs that are significant.  Continue watching diet.  Patient says she is fatigued during the day and sleeps really easy when she sits down and she has sporadic sleep at night and does not feel like she is getting refreshing sleep.  We discussed sleep apnea and patient does not want to go do a sleep study at this point. Follow up plan: Return in about 3 months (around 01/20/2020), or if symptoms worsen or fail to improve, for dm and htn.  Counseling provided for all of the  vaccine components No orders of the defined types were placed in this encounter.   Caryl Pina, MD [facility] 10/20/2019, 2:37 PM

## 2019-10-25 ENCOUNTER — Telehealth: Payer: Self-pay | Admitting: Family Medicine

## 2019-10-28 DIAGNOSIS — M47817 Spondylosis without myelopathy or radiculopathy, lumbosacral region: Secondary | ICD-10-CM | POA: Diagnosis not present

## 2019-10-28 DIAGNOSIS — M5137 Other intervertebral disc degeneration, lumbosacral region: Secondary | ICD-10-CM | POA: Diagnosis not present

## 2019-10-28 DIAGNOSIS — Z79899 Other long term (current) drug therapy: Secondary | ICD-10-CM | POA: Diagnosis not present

## 2019-10-28 DIAGNOSIS — Z79891 Long term (current) use of opiate analgesic: Secondary | ICD-10-CM | POA: Diagnosis not present

## 2019-10-28 DIAGNOSIS — M1712 Unilateral primary osteoarthritis, left knee: Secondary | ICD-10-CM | POA: Diagnosis not present

## 2019-10-28 DIAGNOSIS — G894 Chronic pain syndrome: Secondary | ICD-10-CM | POA: Diagnosis not present

## 2019-11-20 ENCOUNTER — Other Ambulatory Visit: Payer: Self-pay | Admitting: Family Medicine

## 2019-11-23 DIAGNOSIS — E119 Type 2 diabetes mellitus without complications: Secondary | ICD-10-CM | POA: Diagnosis not present

## 2019-11-23 DIAGNOSIS — Z0100 Encounter for examination of eyes and vision without abnormal findings: Secondary | ICD-10-CM | POA: Diagnosis not present

## 2019-11-30 ENCOUNTER — Other Ambulatory Visit: Payer: Self-pay | Admitting: Family Medicine

## 2019-11-30 DIAGNOSIS — F3342 Major depressive disorder, recurrent, in full remission: Secondary | ICD-10-CM

## 2019-11-30 DIAGNOSIS — M797 Fibromyalgia: Secondary | ICD-10-CM

## 2019-12-02 DIAGNOSIS — M1712 Unilateral primary osteoarthritis, left knee: Secondary | ICD-10-CM | POA: Diagnosis not present

## 2019-12-02 DIAGNOSIS — M47817 Spondylosis without myelopathy or radiculopathy, lumbosacral region: Secondary | ICD-10-CM | POA: Diagnosis not present

## 2019-12-02 DIAGNOSIS — G894 Chronic pain syndrome: Secondary | ICD-10-CM | POA: Diagnosis not present

## 2019-12-02 DIAGNOSIS — M5137 Other intervertebral disc degeneration, lumbosacral region: Secondary | ICD-10-CM | POA: Diagnosis not present

## 2019-12-20 ENCOUNTER — Other Ambulatory Visit: Payer: Self-pay | Admitting: Family Medicine

## 2019-12-22 DIAGNOSIS — C44519 Basal cell carcinoma of skin of other part of trunk: Secondary | ICD-10-CM | POA: Diagnosis not present

## 2020-01-06 DIAGNOSIS — G894 Chronic pain syndrome: Secondary | ICD-10-CM | POA: Diagnosis not present

## 2020-01-06 DIAGNOSIS — M47817 Spondylosis without myelopathy or radiculopathy, lumbosacral region: Secondary | ICD-10-CM | POA: Diagnosis not present

## 2020-01-06 DIAGNOSIS — M1712 Unilateral primary osteoarthritis, left knee: Secondary | ICD-10-CM | POA: Diagnosis not present

## 2020-01-06 DIAGNOSIS — M5137 Other intervertebral disc degeneration, lumbosacral region: Secondary | ICD-10-CM | POA: Diagnosis not present

## 2020-01-19 ENCOUNTER — Other Ambulatory Visit: Payer: Medicare Other

## 2020-01-19 ENCOUNTER — Other Ambulatory Visit: Payer: Self-pay

## 2020-01-19 DIAGNOSIS — E785 Hyperlipidemia, unspecified: Secondary | ICD-10-CM | POA: Diagnosis not present

## 2020-01-19 DIAGNOSIS — E1169 Type 2 diabetes mellitus with other specified complication: Secondary | ICD-10-CM | POA: Diagnosis not present

## 2020-01-19 DIAGNOSIS — I10 Essential (primary) hypertension: Secondary | ICD-10-CM

## 2020-01-19 LAB — BAYER DCA HB A1C WAIVED: HB A1C (BAYER DCA - WAIVED): 6.8 % (ref <7.0)

## 2020-01-20 LAB — CBC WITH DIFFERENTIAL/PLATELET
Basophils Absolute: 0.1 10*3/uL (ref 0.0–0.2)
Basos: 1 %
EOS (ABSOLUTE): 0.3 10*3/uL (ref 0.0–0.4)
Eos: 3 %
Hematocrit: 37.3 % (ref 34.0–46.6)
Hemoglobin: 12.5 g/dL (ref 11.1–15.9)
Immature Grans (Abs): 0 10*3/uL (ref 0.0–0.1)
Immature Granulocytes: 0 %
Lymphocytes Absolute: 3.3 10*3/uL — ABNORMAL HIGH (ref 0.7–3.1)
Lymphs: 35 %
MCH: 31.8 pg (ref 26.6–33.0)
MCHC: 33.5 g/dL (ref 31.5–35.7)
MCV: 95 fL (ref 79–97)
Monocytes Absolute: 0.5 10*3/uL (ref 0.1–0.9)
Monocytes: 6 %
Neutrophils Absolute: 5.1 10*3/uL (ref 1.4–7.0)
Neutrophils: 55 %
Platelets: 210 10*3/uL (ref 150–450)
RBC: 3.93 x10E6/uL (ref 3.77–5.28)
RDW: 13.4 % (ref 11.7–15.4)
WBC: 9.3 10*3/uL (ref 3.4–10.8)

## 2020-01-20 LAB — CMP14+EGFR
ALT: 24 IU/L (ref 0–32)
AST: 26 IU/L (ref 0–40)
Albumin/Globulin Ratio: 1.9 (ref 1.2–2.2)
Albumin: 4.2 g/dL (ref 3.7–4.7)
Alkaline Phosphatase: 126 IU/L — ABNORMAL HIGH (ref 44–121)
BUN/Creatinine Ratio: 23 (ref 12–28)
BUN: 13 mg/dL (ref 8–27)
Bilirubin Total: 0.2 mg/dL (ref 0.0–1.2)
CO2: 34 mmol/L — ABNORMAL HIGH (ref 20–29)
Calcium: 8.8 mg/dL (ref 8.7–10.3)
Chloride: 100 mmol/L (ref 96–106)
Creatinine, Ser: 0.57 mg/dL (ref 0.57–1.00)
GFR calc Af Amer: 105 mL/min/{1.73_m2} (ref >59)
GFR calc non Af Amer: 91 mL/min/{1.73_m2} (ref >59)
Globulin, Total: 2.2 g/dL (ref 1.5–4.5)
Glucose: 112 mg/dL — ABNORMAL HIGH (ref 65–99)
Potassium: 3.3 mmol/L — ABNORMAL LOW (ref 3.5–5.2)
Sodium: 146 mmol/L — ABNORMAL HIGH (ref 134–144)
Total Protein: 6.4 g/dL (ref 6.0–8.5)

## 2020-01-20 LAB — LIPID PANEL
Chol/HDL Ratio: 3 ratio (ref 0.0–4.4)
Cholesterol, Total: 152 mg/dL (ref 100–199)
HDL: 50 mg/dL (ref >39)
LDL Chol Calc (NIH): 73 mg/dL (ref 0–99)
Triglycerides: 171 mg/dL — ABNORMAL HIGH (ref 0–149)
VLDL Cholesterol Cal: 29 mg/dL (ref 5–40)

## 2020-01-21 ENCOUNTER — Other Ambulatory Visit: Payer: Self-pay | Admitting: Family Medicine

## 2020-01-24 ENCOUNTER — Other Ambulatory Visit: Payer: Self-pay

## 2020-01-24 ENCOUNTER — Ambulatory Visit (INDEPENDENT_AMBULATORY_CARE_PROVIDER_SITE_OTHER): Payer: Medicare Other | Admitting: Family Medicine

## 2020-01-24 ENCOUNTER — Encounter: Payer: Self-pay | Admitting: Family Medicine

## 2020-01-24 VITALS — BP 144/76 | HR 94 | Temp 97.8°F | Ht 65.5 in | Wt 269.0 lb

## 2020-01-24 DIAGNOSIS — E785 Hyperlipidemia, unspecified: Secondary | ICD-10-CM

## 2020-01-24 DIAGNOSIS — Z23 Encounter for immunization: Secondary | ICD-10-CM | POA: Diagnosis not present

## 2020-01-24 DIAGNOSIS — Z780 Asymptomatic menopausal state: Secondary | ICD-10-CM | POA: Diagnosis not present

## 2020-01-24 DIAGNOSIS — I10 Essential (primary) hypertension: Secondary | ICD-10-CM

## 2020-01-24 DIAGNOSIS — E1169 Type 2 diabetes mellitus with other specified complication: Secondary | ICD-10-CM | POA: Diagnosis not present

## 2020-01-24 DIAGNOSIS — E782 Mixed hyperlipidemia: Secondary | ICD-10-CM

## 2020-01-24 MED ORDER — GABAPENTIN 100 MG PO CAPS: 100.0000 mg | ORAL_CAPSULE | Freq: Two times a day (BID) | ORAL | 3 refills | Status: DC

## 2020-01-24 NOTE — Progress Notes (Signed)
BP (!) 144/76   Pulse 94   Temp 97.8 F (36.6 C)   Ht 5' 5.5" (1.664 m)   Wt 269 lb (122 kg)   SpO2 96%   BMI 44.08 kg/m    Subjective:   Patient ID: Tiffany Brown, female    DOB: 03-27-1946, 74 y.o.   MRN: [id number]  HPI: Tiffany Brown is a 74 y.o. female presenting on 01/24/2020 for Medical Management of Chronic Issues, Hypertension, and Hyperlipidemia   HPI Hypertension Patient is currently on amlodipine and losartan, and their blood pressure today is 144/76. Patient denies any lightheadedness or dizziness. Patient denies headaches, blurred vision, chest pains, shortness of breath, or weakness. Denies any side effects from medication and is content with current medication.   Type 2 diabetes mellitus Patient comes in today for recheck of his diabetes. Patient has been currently taking no medication currently and is diet controlled, A1c is 6.8 today. Patient is currently on an ACE inhibitor/ARB. Patient has seen an ophthalmologist this year. Patient denies any issues with their feet. The symptom started onset as an adult htn and hld ARE RELATED TO DM   Hyperlipidemia Patient is coming in for recheck of his hyperlipidemia. The patient is currently taking pravastatin. They deny any issues with history of liver damage from it. They deny any focal numbness or weakness or chest pain.   Patient is having a lot of myalgias and says that her medicine is not working to control her fibromyalgia and would like to change or increase.  She is currently taking Cymbalta.  Relevant past medical, surgical, family and social history reviewed and updated as indicated. Interim medical history since our last visit reviewed. Allergies and medications reviewed and updated.  Review of Systems  Constitutional: Negative for chills and fever.  Eyes: Negative for visual disturbance.  Respiratory: Negative for chest tightness and shortness of breath.   Cardiovascular: Negative for chest pain and leg  swelling.  Genitourinary: Negative for difficulty urinating and dysuria.  Musculoskeletal: Positive for myalgias. Negative for back pain and gait problem.  Skin: Negative for rash.  Neurological: Negative for light-headedness and headaches.  Psychiatric/Behavioral: Negative for agitation and behavioral problems.  All other systems reviewed and are negative.   Per HPI unless specifically indicated above   Allergies as of 01/24/2020      Reactions   Other       Medication List       Accurate as of January 24, 2020  1:43 PM. If you have any questions, ask your nurse or doctor.        amLODipine 5 MG tablet Commonly known as: NORVASC Take 1 tablet (5 mg total) by mouth daily.   DULoxetine 60 MG capsule Commonly known as: CYMBALTA TAKE 2 CAPSULES BY MOUTH DAILY   fluticasone 50 MCG/ACT nasal spray Commonly known as: FLONASE Place 2 sprays into the nose daily as needed (allergies).   furosemide 40 MG tablet Commonly known as: LASIX Take 1 tablet (40 mg total) by mouth 2 (two) times daily as needed.   gabapentin 100 MG capsule Commonly known as: NEURONTIN Take 1 capsule (100 mg total) by mouth 2 (two) times daily. What changed: when to take this Changed by: Worthy Rancher, MD   HYDROcodone-acetaminophen 10-325 MG tablet Commonly known as: NORCO Take 1 tablet by mouth every 6 (six) hours as needed for moderate pain.   losartan 100 MG tablet Commonly known as: COZAAR TAKE 1 TABLET BY MOUTH EVERY DAY  meloxicam 7.5 MG tablet Commonly known as: MOBIC Take 1 tablet (7.5 mg total) by mouth daily.   omeprazole 40 MG capsule Commonly known as: PRILOSEC Take 40 mg by mouth every morning.   Potassium Chloride ER 20 MEQ Tbcr   pravastatin 80 MG tablet Commonly known as: PRAVACHOL TAKE 1 TABLET BY MOUTH EVERY DAY   Vitamin D (Ergocalciferol) 1.25 MG (50000 UNIT) Caps capsule Commonly known as: DRISDOL Take 1 capsule (50,000 Units total) by mouth every 7  (seven) days.        Objective:   BP (!) 144/76   Pulse 94   Temp 97.8 F (36.6 C)   Ht 5' 5.5" (1.664 m)   Wt 269 lb (122 kg)   SpO2 96%   BMI 44.08 kg/m   Wt Readings from Last 3 Encounters:  01/24/20 269 lb (122 kg)  10/20/19 271 lb (122.9 kg)  07/15/19 270 lb (122.5 kg)    Physical Exam Vitals and nursing note reviewed.  Constitutional:      General: She is not in acute distress.    Appearance: She is well-developed. She is not diaphoretic.  Eyes:     Conjunctiva/sclera: Conjunctivae normal.  Cardiovascular:     Rate and Rhythm: Normal rate and regular rhythm.     Heart sounds: Normal heart sounds. No murmur heard.   Pulmonary:     Effort: Pulmonary effort is normal. No respiratory distress.     Breath sounds: Normal breath sounds. No wheezing.  Musculoskeletal:        General: No tenderness. Normal range of motion.  Skin:    General: Skin is warm and dry.     Findings: No rash.  Neurological:     Mental Status: She is alert and oriented to person, place, and time.     Coordination: Coordination normal.  Psychiatric:        Behavior: Behavior normal.     Results for orders placed or performed in visit on 01/19/20  CBC with Differential/Platelet  Result Value Ref Range   WBC 9.3 3.4 - 10.8 x10E3/uL   RBC 3.93 3.77 - 5.28 x10E6/uL   Hemoglobin 12.5 11.1 - 15.9 g/dL   Hematocrit 37.3 34.0 - 46.6 %   MCV 95 79 - 97 fL   MCH 31.8 26.6 - 33.0 pg   MCHC 33.5 31 - 35 g/dL   RDW 13.4 11.7 - 15.4 %   Platelets 210 150 - 450 x10E3/uL   Neutrophils 55 Not Estab. %   Lymphs 35 Not Estab. %   Monocytes 6 Not Estab. %   Eos 3 Not Estab. %   Basos 1 Not Estab. %   Neutrophils Absolute 5.1 1 - 7 x10E3/uL   Lymphocytes Absolute 3.3 (H) 0 - 3 x10E3/uL   Monocytes Absolute 0.5 0 - 0 x10E3/uL   EOS (ABSOLUTE) 0.3 0.0 - 0.4 x10E3/uL   Basophils Absolute 0.1 0 - 0 x10E3/uL   Immature Granulocytes 0 Not Estab. %   Immature Grans (Abs) 0.0 0.0 - 0.1 x10E3/uL    CMP14+EGFR  Result Value Ref Range   Glucose 112 (H) 65 - 99 mg/dL   BUN 13 8 - 27 mg/dL   Creatinine, Ser 0.57 0.57 - 1.00 mg/dL   GFR calc non Af Amer 91 >59 mL/min/1.73   GFR calc Af Amer 105 >59 mL/min/1.73   BUN/Creatinine Ratio 23 12 - 28   Sodium 146 (H) 134 - 144 mmol/L   Potassium 3.3 (L) 3.5 - 5.2  mmol/L   Chloride 100 96 - 106 mmol/L   CO2 34 (H) 20 - 29 mmol/L   Calcium 8.8 8.7 - 10.3 mg/dL   Total Protein 6.4 6.0 - 8.5 g/dL   Albumin 4.2 3.7 - 4.7 g/dL   Globulin, Total 2.2 1.5 - 4.5 g/dL   Albumin/Globulin Ratio 1.9 1.2 - 2.2   Bilirubin Total 0.2 0.0 - 1.2 mg/dL   Alkaline Phosphatase 126 (H) 44 - 121 IU/L   AST 26 0 - 40 IU/L   ALT 24 0 - 32 IU/L  Lipid panel  Result Value Ref Range   Cholesterol, Total 152 100 - 199 mg/dL   Triglycerides 171 (H) 0 - 149 mg/dL   HDL 50 >39 mg/dL   VLDL Cholesterol Cal 29 5 - 40 mg/dL   LDL Chol Calc (NIH) 73 0 - 99 mg/dL   Chol/HDL Ratio 3.0 0.0 - 4.4 ratio  Bayer DCA Hb A1c Waived  Result Value Ref Range   HB A1C (BAYER DCA - WAIVED) 6.8 <7.0 %    Assessment & Plan:   Problem List Items Addressed This Visit      Cardiovascular and Mediastinum   Essential hypertension, benign     Endocrine   DM type 2 with diabetic dyslipidemia ([place]) - Primary   Relevant Orders   Bayer DCA Hb A1c Waived     Other   Hyperlipidemia    Other Visit Diagnoses    Flu vaccine need       Relevant Orders   Flu Vaccine QUAD High Dose(Fluad) (Completed)   Postmenopausal       Relevant Orders   DG [facility] DEXA      Will start gabapentin to help with myalgias, continue Cymbalta and will follow up from there. Follow up plan: Return in about 3 months (around 04/24/2020), or if symptoms worsen or fail to improve, for dm htn and hld.  Counseling provided for all of the vaccine components Orders Placed This Encounter  Procedures  . DG [facility] DEXA  . Flu Vaccine QUAD High Dose(Fluad)  . Bayer [facility] Hb A1c [place],  MD [facility] 01/24/2020, 1:43 PM

## 2020-01-30 ENCOUNTER — Other Ambulatory Visit: Payer: Self-pay | Admitting: Family Medicine

## 2020-02-08 DIAGNOSIS — M1712 Unilateral primary osteoarthritis, left knee: Secondary | ICD-10-CM | POA: Diagnosis not present

## 2020-02-08 DIAGNOSIS — Z79899 Other long term (current) drug therapy: Secondary | ICD-10-CM | POA: Diagnosis not present

## 2020-02-08 DIAGNOSIS — M5137 Other intervertebral disc degeneration, lumbosacral region: Secondary | ICD-10-CM | POA: Diagnosis not present

## 2020-02-08 DIAGNOSIS — G894 Chronic pain syndrome: Secondary | ICD-10-CM | POA: Diagnosis not present

## 2020-02-08 DIAGNOSIS — Z79891 Long term (current) use of opiate analgesic: Secondary | ICD-10-CM | POA: Diagnosis not present

## 2020-02-08 DIAGNOSIS — M47817 Spondylosis without myelopathy or radiculopathy, lumbosacral region: Secondary | ICD-10-CM | POA: Diagnosis not present

## 2020-02-15 ENCOUNTER — Other Ambulatory Visit: Payer: Self-pay | Admitting: Family Medicine

## 2020-02-15 DIAGNOSIS — M1712 Unilateral primary osteoarthritis, left knee: Secondary | ICD-10-CM | POA: Diagnosis not present

## 2020-02-17 DIAGNOSIS — E119 Type 2 diabetes mellitus without complications: Secondary | ICD-10-CM | POA: Diagnosis not present

## 2020-02-17 DIAGNOSIS — H2513 Age-related nuclear cataract, bilateral: Secondary | ICD-10-CM | POA: Diagnosis not present

## 2020-02-17 LAB — HM DIABETES EYE EXAM

## 2020-02-18 ENCOUNTER — Other Ambulatory Visit: Payer: Self-pay | Admitting: Family Medicine

## 2020-02-21 ENCOUNTER — Other Ambulatory Visit: Payer: Self-pay | Admitting: Family Medicine

## 2020-02-21 DIAGNOSIS — M797 Fibromyalgia: Secondary | ICD-10-CM

## 2020-02-21 DIAGNOSIS — F3342 Major depressive disorder, recurrent, in full remission: Secondary | ICD-10-CM

## 2020-03-09 DIAGNOSIS — M5137 Other intervertebral disc degeneration, lumbosacral region: Secondary | ICD-10-CM | POA: Diagnosis not present

## 2020-03-09 DIAGNOSIS — G894 Chronic pain syndrome: Secondary | ICD-10-CM | POA: Diagnosis not present

## 2020-03-09 DIAGNOSIS — M1712 Unilateral primary osteoarthritis, left knee: Secondary | ICD-10-CM | POA: Diagnosis not present

## 2020-03-09 DIAGNOSIS — M47817 Spondylosis without myelopathy or radiculopathy, lumbosacral region: Secondary | ICD-10-CM | POA: Diagnosis not present

## 2020-03-17 ENCOUNTER — Other Ambulatory Visit: Payer: Self-pay | Admitting: Family Medicine

## 2020-03-17 DIAGNOSIS — I10 Essential (primary) hypertension: Secondary | ICD-10-CM

## 2020-03-23 DIAGNOSIS — I8310 Varicose veins of unspecified lower extremity with inflammation: Secondary | ICD-10-CM | POA: Diagnosis not present

## 2020-03-23 DIAGNOSIS — D485 Neoplasm of uncertain behavior of skin: Secondary | ICD-10-CM | POA: Diagnosis not present

## 2020-03-23 DIAGNOSIS — L570 Actinic keratosis: Secondary | ICD-10-CM | POA: Diagnosis not present

## 2020-03-23 DIAGNOSIS — C44311 Basal cell carcinoma of skin of nose: Secondary | ICD-10-CM | POA: Diagnosis not present

## 2020-04-05 DIAGNOSIS — M25522 Pain in left elbow: Secondary | ICD-10-CM | POA: Diagnosis not present

## 2020-04-06 DIAGNOSIS — M5137 Other intervertebral disc degeneration, lumbosacral region: Secondary | ICD-10-CM | POA: Diagnosis not present

## 2020-04-06 DIAGNOSIS — M1712 Unilateral primary osteoarthritis, left knee: Secondary | ICD-10-CM | POA: Diagnosis not present

## 2020-04-06 DIAGNOSIS — M47817 Spondylosis without myelopathy or radiculopathy, lumbosacral region: Secondary | ICD-10-CM | POA: Diagnosis not present

## 2020-04-06 DIAGNOSIS — G894 Chronic pain syndrome: Secondary | ICD-10-CM | POA: Diagnosis not present

## 2020-04-20 ENCOUNTER — Other Ambulatory Visit: Payer: Self-pay

## 2020-04-20 ENCOUNTER — Other Ambulatory Visit: Payer: Medicare Other

## 2020-04-20 DIAGNOSIS — E1169 Type 2 diabetes mellitus with other specified complication: Secondary | ICD-10-CM | POA: Diagnosis not present

## 2020-04-20 DIAGNOSIS — E785 Hyperlipidemia, unspecified: Secondary | ICD-10-CM

## 2020-04-20 LAB — BAYER DCA HB A1C WAIVED: HB A1C (BAYER DCA - WAIVED): 6.3 % (ref <7.0)

## 2020-04-22 ENCOUNTER — Other Ambulatory Visit: Payer: Self-pay | Admitting: Family Medicine

## 2020-04-24 ENCOUNTER — Ambulatory Visit (INDEPENDENT_AMBULATORY_CARE_PROVIDER_SITE_OTHER): Payer: Medicare Other

## 2020-04-24 ENCOUNTER — Encounter: Payer: Self-pay | Admitting: Family Medicine

## 2020-04-24 ENCOUNTER — Ambulatory Visit (INDEPENDENT_AMBULATORY_CARE_PROVIDER_SITE_OTHER): Payer: Medicare Other | Admitting: Family Medicine

## 2020-04-24 ENCOUNTER — Other Ambulatory Visit: Payer: Self-pay

## 2020-04-24 VITALS — BP 148/76 | HR 86 | Ht 65.5 in | Wt 268.0 lb

## 2020-04-24 DIAGNOSIS — I10 Essential (primary) hypertension: Secondary | ICD-10-CM

## 2020-04-24 DIAGNOSIS — E782 Mixed hyperlipidemia: Secondary | ICD-10-CM | POA: Diagnosis not present

## 2020-04-24 DIAGNOSIS — M5136 Other intervertebral disc degeneration, lumbar region: Secondary | ICD-10-CM | POA: Diagnosis not present

## 2020-04-24 DIAGNOSIS — Z780 Asymptomatic menopausal state: Secondary | ICD-10-CM

## 2020-04-24 DIAGNOSIS — E785 Hyperlipidemia, unspecified: Secondary | ICD-10-CM | POA: Diagnosis not present

## 2020-04-24 DIAGNOSIS — E1169 Type 2 diabetes mellitus with other specified complication: Secondary | ICD-10-CM | POA: Diagnosis not present

## 2020-04-24 DIAGNOSIS — K219 Gastro-esophageal reflux disease without esophagitis: Secondary | ICD-10-CM | POA: Diagnosis not present

## 2020-04-24 DIAGNOSIS — M47817 Spondylosis without myelopathy or radiculopathy, lumbosacral region: Secondary | ICD-10-CM | POA: Diagnosis not present

## 2020-04-24 NOTE — Progress Notes (Signed)
BP (!) 148/76   Pulse 86   Ht 5' 5.5" (1.664 m)   Wt 268 lb (121.6 kg)   SpO2 98%   BMI 43.92 kg/m    Subjective:   Patient ID: [name], female    DOB: 08-04-1945, 74 y.o.   MRN: [id number]  HPI: Tiffany Brown is a 74 y.o. female presenting on 04/24/2020 for Medical Management of Chronic Issues, Diabetes, Hypertension, and Hyperlipidemia   HPI Type 2 diabetes mellitus Patient comes in today for recheck of his diabetes. Patient has been currently taking no medication currently has been diet controlled.  A1c is 6.3 looking good, improved from last time.. Patient is currently on an ACE inhibitor/ARB. Patient has seen an ophthalmologist this year. Patient denies any new issues with their feet. The symptom started onset as an adult peripheral neuropathy and hyperlipidemia and hypertension ARE RELATED TO DM   Hypertension Patient is currently on amlodipine and furosemide and losartan, and their blood pressure today is 148/76. Patient denies any lightheadedness or dizziness. Patient denies headaches, blurred vision, chest pains, shortness of breath, or weakness. Denies any side effects from medication and is content with current medication.   Hyperlipidemia Patient is coming in for recheck of his hyperlipidemia. The patient is currently taking pravastatin. They deny any issues with myalgias or history of liver damage from it. They deny any focal numbness or weakness or chest pain.   GERD Patient is currently on omeprazole.  She denies any major symptoms or abdominal pain or belching or burping. She denies any blood in her stool or lightheadedness or dizziness.   Relevant past medical, surgical, family and social history reviewed and updated as indicated. Interim medical history since our last visit reviewed. Allergies and medications reviewed and updated.  Review of Systems  Constitutional: Negative for chills and fever.  Eyes: Negative for visual disturbance.  Respiratory:  Negative for chest tightness and shortness of breath.   Cardiovascular: Negative for chest pain and leg swelling.  Genitourinary: Negative for difficulty urinating and dysuria.  Musculoskeletal: Negative for back pain and gait problem.  Skin: Negative for rash.  Neurological: Positive for numbness. Negative for dizziness, weakness, light-headedness and headaches.  Psychiatric/Behavioral: Negative for agitation and behavioral problems.  All other systems reviewed and are negative.   Per HPI unless specifically indicated above   Allergies as of 04/24/2020      Reactions   Other       Medication List       Accurate as of April 24, 2020 11:14 AM. If you have any questions, ask your nurse or doctor.        STOP taking these medications   Potassium Chloride ER 20 MEQ Tbcr Stopped by: Fransisca Kaufmann Lauris Keepers, MD   Vitamin D (Ergocalciferol) 1.25 MG (50000 UNIT) Caps capsule Commonly known as: DRISDOL Stopped by: Fransisca Kaufmann Kimmy Totten, MD     TAKE these medications   amLODipine 5 MG tablet Commonly known as: NORVASC TAKE 1 TABLET BY MOUTH EVERY DAY   DULoxetine 60 MG capsule Commonly known as: CYMBALTA TAKE 2 CAPSULES BY MOUTH DAILY   fluticasone 50 MCG/ACT nasal spray Commonly known as: FLONASE Place 2 sprays into the nose daily as needed (allergies).   furosemide 40 MG tablet Commonly known as: LASIX Take 1 tablet (40 mg total) by mouth 2 (two) times daily as needed.   gabapentin 100 MG capsule Commonly known as: NEURONTIN Take 1 capsule (100 mg total) by mouth 2 (two) times  daily.   HYDROcodone-acetaminophen 10-325 MG tablet Commonly known as: NORCO Take 1 tablet by mouth every 6 (six) hours as needed for moderate pain.   losartan 100 MG tablet Commonly known as: COZAAR TAKE 1 TABLET BY MOUTH EVERY DAY   meloxicam 7.5 MG tablet Commonly known as: MOBIC TAKE 1 TABLET BY MOUTH EVERY DAY   omeprazole 40 MG capsule Commonly known as: PRILOSEC Take 40 mg by mouth  every morning.   pravastatin 80 MG tablet Commonly known as: PRAVACHOL TAKE 1 TABLET BY MOUTH EVERY DAY        Objective:   BP (!) 148/76   Pulse 86   Ht 5' 5.5" (1.664 m)   Wt 268 lb (121.6 kg)   SpO2 98%   BMI 43.92 kg/m   Wt Readings from Last 3 Encounters:  04/24/20 268 lb (121.6 kg)  01/24/20 269 lb (122 kg)  10/20/19 271 lb (122.9 kg)    Physical Exam Vitals and nursing note reviewed.  Constitutional:      General: She is not in acute distress.    Appearance: She is well-developed and well-nourished. She is not diaphoretic.  Eyes:     Extraocular Movements: EOM normal.     Conjunctiva/sclera: Conjunctivae normal.  Cardiovascular:     Rate and Rhythm: Normal rate and regular rhythm.     Pulses: Intact distal pulses.     Heart sounds: Normal heart sounds. No murmur heard.   Pulmonary:     Effort: Pulmonary effort is normal. No respiratory distress.     Breath sounds: Normal breath sounds. No wheezing.  Musculoskeletal:        General: No tenderness or edema. Normal range of motion.  Skin:    General: Skin is warm and dry.     Findings: No rash.  Neurological:     Mental Status: She is alert and oriented to person, place, and time.     Coordination: Coordination normal.  Psychiatric:        Mood and Affect: Mood and affect normal.        Behavior: Behavior normal.     Results for orders placed or performed in visit on 04/20/20  Bayer DCA Hb A1c Waived  Result Value Ref Range   HB A1C (BAYER DCA - WAIVED) 6.3 <7.0 %    Assessment & Plan:   Problem List Items Addressed This Visit      Cardiovascular and Mediastinum   Essential hypertension, benign     Digestive   Gastroesophageal reflux disease     Endocrine   DM type 2 with diabetic dyslipidemia ([place]) - Primary     Other   Hyperlipidemia      Continue current medication, A1c looks great with diet controlled, allowing permissive hypertension to prevent syncope. Follow up plan: Return in  about 3 months (around 07/23/2020), or if symptoms worsen or fail to improve, for Diabetes and hypertension and GERD.  Counseling provided for all of the vaccine components No orders of the defined types were placed in this encounter.   Caryl Pina, MD [facility] 04/24/2020, 11:14 AM

## 2020-05-02 DIAGNOSIS — M8589 Other specified disorders of bone density and structure, multiple sites: Secondary | ICD-10-CM | POA: Diagnosis not present

## 2020-05-02 DIAGNOSIS — Z780 Asymptomatic menopausal state: Secondary | ICD-10-CM | POA: Diagnosis not present

## 2020-05-09 DIAGNOSIS — M5137 Other intervertebral disc degeneration, lumbosacral region: Secondary | ICD-10-CM | POA: Diagnosis not present

## 2020-05-09 DIAGNOSIS — Z79891 Long term (current) use of opiate analgesic: Secondary | ICD-10-CM | POA: Diagnosis not present

## 2020-05-09 DIAGNOSIS — M1712 Unilateral primary osteoarthritis, left knee: Secondary | ICD-10-CM | POA: Diagnosis not present

## 2020-05-09 DIAGNOSIS — Z79899 Other long term (current) drug therapy: Secondary | ICD-10-CM | POA: Diagnosis not present

## 2020-05-09 DIAGNOSIS — G894 Chronic pain syndrome: Secondary | ICD-10-CM | POA: Diagnosis not present

## 2020-05-09 DIAGNOSIS — M47817 Spondylosis without myelopathy or radiculopathy, lumbosacral region: Secondary | ICD-10-CM | POA: Diagnosis not present

## 2020-05-25 ENCOUNTER — Other Ambulatory Visit: Payer: Self-pay | Admitting: Family Medicine

## 2020-05-25 DIAGNOSIS — F3342 Major depressive disorder, recurrent, in full remission: Secondary | ICD-10-CM

## 2020-05-25 DIAGNOSIS — M797 Fibromyalgia: Secondary | ICD-10-CM

## 2020-06-06 DIAGNOSIS — Z79891 Long term (current) use of opiate analgesic: Secondary | ICD-10-CM | POA: Diagnosis not present

## 2020-06-06 DIAGNOSIS — M5137 Other intervertebral disc degeneration, lumbosacral region: Secondary | ICD-10-CM | POA: Diagnosis not present

## 2020-06-06 DIAGNOSIS — M1712 Unilateral primary osteoarthritis, left knee: Secondary | ICD-10-CM | POA: Diagnosis not present

## 2020-06-06 DIAGNOSIS — G894 Chronic pain syndrome: Secondary | ICD-10-CM | POA: Diagnosis not present

## 2020-06-06 DIAGNOSIS — M47817 Spondylosis without myelopathy or radiculopathy, lumbosacral region: Secondary | ICD-10-CM | POA: Diagnosis not present

## 2020-06-06 DIAGNOSIS — Z79899 Other long term (current) drug therapy: Secondary | ICD-10-CM | POA: Diagnosis not present

## 2020-06-12 ENCOUNTER — Other Ambulatory Visit: Payer: Self-pay | Admitting: Family Medicine

## 2020-06-12 DIAGNOSIS — I10 Essential (primary) hypertension: Secondary | ICD-10-CM

## 2020-06-13 ENCOUNTER — Other Ambulatory Visit: Payer: Self-pay | Admitting: Family Medicine

## 2020-06-23 ENCOUNTER — Other Ambulatory Visit: Payer: Self-pay | Admitting: Family Medicine

## 2020-06-23 DIAGNOSIS — Z1231 Encounter for screening mammogram for malignant neoplasm of breast: Secondary | ICD-10-CM

## 2020-06-26 DIAGNOSIS — C44311 Basal cell carcinoma of skin of nose: Secondary | ICD-10-CM | POA: Diagnosis not present

## 2020-06-26 DIAGNOSIS — L259 Unspecified contact dermatitis, unspecified cause: Secondary | ICD-10-CM | POA: Diagnosis not present

## 2020-07-04 DIAGNOSIS — G894 Chronic pain syndrome: Secondary | ICD-10-CM | POA: Diagnosis not present

## 2020-07-04 DIAGNOSIS — M5137 Other intervertebral disc degeneration, lumbosacral region: Secondary | ICD-10-CM | POA: Diagnosis not present

## 2020-07-04 DIAGNOSIS — M1712 Unilateral primary osteoarthritis, left knee: Secondary | ICD-10-CM | POA: Diagnosis not present

## 2020-07-04 DIAGNOSIS — M47817 Spondylosis without myelopathy or radiculopathy, lumbosacral region: Secondary | ICD-10-CM | POA: Diagnosis not present

## 2020-07-17 ENCOUNTER — Other Ambulatory Visit: Payer: Self-pay | Admitting: Family Medicine

## 2020-07-18 DIAGNOSIS — M1712 Unilateral primary osteoarthritis, left knee: Secondary | ICD-10-CM | POA: Diagnosis not present

## 2020-07-19 ENCOUNTER — Ambulatory Visit (INDEPENDENT_AMBULATORY_CARE_PROVIDER_SITE_OTHER): Payer: Medicare Other

## 2020-07-19 ENCOUNTER — Encounter: Payer: Self-pay | Admitting: Podiatry

## 2020-07-19 ENCOUNTER — Ambulatory Visit: Payer: Medicare Other | Admitting: Podiatry

## 2020-07-19 ENCOUNTER — Other Ambulatory Visit: Payer: Self-pay

## 2020-07-19 DIAGNOSIS — G629 Polyneuropathy, unspecified: Secondary | ICD-10-CM | POA: Diagnosis not present

## 2020-07-19 DIAGNOSIS — M25572 Pain in left ankle and joints of left foot: Secondary | ICD-10-CM

## 2020-07-19 DIAGNOSIS — M76822 Posterior tibial tendinitis, left leg: Secondary | ICD-10-CM | POA: Diagnosis not present

## 2020-07-19 MED ORDER — TRIAMCINOLONE ACETONIDE 10 MG/ML IJ SUSP
10.0000 mg | Freq: Once | INTRAMUSCULAR | Status: AC
  Administered 2020-07-19: 10 mg

## 2020-07-19 MED ORDER — GABAPENTIN 300 MG PO CAPS: 300.0000 mg | ORAL_CAPSULE | Freq: Three times a day (TID) | ORAL | 3 refills | Status: DC

## 2020-07-19 NOTE — Progress Notes (Signed)
Subjective:   Patient ID: Tiffany Brown, female   DOB: 75 y.o.   MRN: [id number]   HPI Patient presents with caregiver with a lot of discomfort in the left or the right ankle of several week duration.  She does have varicosities and she is obese which is complicating factor for advanced age.  Patient does not smoke and is not active   Review of Systems  All other systems reviewed and are negative.       Objective:  Physical Exam Vitals and nursing note reviewed.  Constitutional:      Appearance: She is well-developed.  Pulmonary:     Effort: Pulmonary effort is normal.  Musculoskeletal:        General: Normal range of motion.  Skin:    General: Skin is warm.  Neurological:     Mental Status: She is alert.     Neurovascular status is diminished with diminishment of sharp dull vibratory and range of motion.  Patient does have swelling in the legs bilateral negative Bevelyn Buckles' sign with obesity is complicating factor and has a lot of discomfort in the medial side of the left ankle with inflammation fluid buildup.  Patient is found to have good digital perfusion at this time and is well oriented     Assessment:  Acute posterior tibial tendinitis left cannot rule out gout or other pathology along with significant edema and long-term obesity     Plan:  H&P reviewed all conditions sterile prep done injected the medial ankle around posterior tib 3 mg Dexasone Kenalog 5 g Xylocaine discussed reduced activity supportive shoes reappoint as needed  X-rays did not indicate bony changes associated with her feet did indicate moderate depression of the arch and obesity like soft tissue swelling

## 2020-07-24 ENCOUNTER — Other Ambulatory Visit: Payer: Self-pay

## 2020-07-24 ENCOUNTER — Ambulatory Visit (INDEPENDENT_AMBULATORY_CARE_PROVIDER_SITE_OTHER): Payer: Medicare Other | Admitting: Family Medicine

## 2020-07-24 ENCOUNTER — Encounter: Payer: Self-pay | Admitting: Family Medicine

## 2020-07-24 VITALS — BP 123/58 | HR 74 | Ht 65.5 in | Wt 270.0 lb

## 2020-07-24 DIAGNOSIS — E782 Mixed hyperlipidemia: Secondary | ICD-10-CM

## 2020-07-24 DIAGNOSIS — M8580 Other specified disorders of bone density and structure, unspecified site: Secondary | ICD-10-CM | POA: Diagnosis not present

## 2020-07-24 DIAGNOSIS — R6889 Other general symptoms and signs: Secondary | ICD-10-CM | POA: Diagnosis not present

## 2020-07-24 DIAGNOSIS — M797 Fibromyalgia: Secondary | ICD-10-CM

## 2020-07-24 DIAGNOSIS — E785 Hyperlipidemia, unspecified: Secondary | ICD-10-CM | POA: Diagnosis not present

## 2020-07-24 DIAGNOSIS — Z743 Need for continuous supervision: Secondary | ICD-10-CM | POA: Diagnosis not present

## 2020-07-24 DIAGNOSIS — F3342 Major depressive disorder, recurrent, in full remission: Secondary | ICD-10-CM | POA: Diagnosis not present

## 2020-07-24 DIAGNOSIS — I10 Essential (primary) hypertension: Secondary | ICD-10-CM

## 2020-07-24 DIAGNOSIS — W19XXXA Unspecified fall, initial encounter: Secondary | ICD-10-CM | POA: Diagnosis not present

## 2020-07-24 DIAGNOSIS — E1169 Type 2 diabetes mellitus with other specified complication: Secondary | ICD-10-CM

## 2020-07-24 LAB — BAYER DCA HB A1C WAIVED: HB A1C (BAYER DCA - WAIVED): 6.7 % (ref <7.0)

## 2020-07-24 MED ORDER — LOSARTAN POTASSIUM 100 MG PO TABS: 100.0000 mg | ORAL_TABLET | Freq: Every day | ORAL | 3 refills | Status: DC

## 2020-07-24 MED ORDER — PRAVASTATIN SODIUM 80 MG PO TABS: 80.0000 mg | ORAL_TABLET | Freq: Every day | ORAL | 3 refills | Status: DC

## 2020-07-24 MED ORDER — DULOXETINE HCL 60 MG PO CPEP
120.0000 mg | ORAL_CAPSULE | Freq: Every day | ORAL | 3 refills | Status: DC
Start: 2020-07-24 — End: 2021-07-24

## 2020-07-24 MED ORDER — AMLODIPINE BESYLATE 5 MG PO TABS: 5.0000 mg | ORAL_TABLET | Freq: Every day | ORAL | 3 refills | Status: DC

## 2020-07-24 NOTE — Progress Notes (Signed)
BP (!) 123/58   Pulse 74   Ht 5' 5.5" (1.664 m)   Wt 270 lb (122.5 kg)   SpO2 93%   BMI 44.25 kg/m    Subjective:   Patient ID: Tiffany Brown, female    DOB: Feb 20, 1946, 75 y.o.   MRN: [id number]  HPI: [name] is a 75 y.o. female presenting on 07/24/2020 for Medical Management of Chronic Issues, Diabetes, Hyperlipidemia, and Ankle Pain (left)   HPI Type 2 diabetes mellitus Patient comes in today for recheck of his diabetes. Patient has been currently taking no medicine currently, diet controlled. Patient is currently on an ACE inhibitor/ARB. Patient has not seen an ophthalmologist this year. Patient denies any issues with their feet. The symptom started onset as an adult hypertension hyperlipidemia ARE RELATED TO DM   Hypertension Patient is currently on amlodipine and furosemide and losartan, and their blood pressure today is 123/58. Patient denies any lightheadedness or dizziness. Patient denies headaches, blurred vision, chest pains, shortness of breath, or weakness. Denies any side effects from medication and is content with current medication.   Hyperlipidemia Patient is coming in for recheck of his hyperlipidemia. The patient is currently taking pravastatin. They deny any issues with myalgias or history of liver damage from it. They deny any focal numbness or weakness or chest pain.   Depression recheck of fibromyalgia Patient takes depression medicine including Cymbalta for depression and fibromyalgia and she says it is working well and denies any major issues with that.  She denies any side effects.  She says does help.  She does still get some muscle aches in general but the medicine works well. Depression screen [facility] 2/9 07/24/2020 07/24/2020 04/24/2020 01/24/2020 10/20/2019  Decreased Interest - 1 1 3 3   Down, Depressed, Hopeless (No Data) 1 1 3 3   PHQ - 2 Score - 2 2 6 6   Altered sleeping - 2 2 2 3   Tired, decreased energy - 3 3 3 3   Change in appetite - 3 3 3 3    Feeling bad or failure about yourself  1 2 2 3 3   Trouble concentrating 0 0 0 0 0  Moving slowly or fidgety/restless 0 - 0 0 0  Suicidal thoughts 0 - 0 1 1  PHQ-9 Score - 12 12 18 19   Difficult doing work/chores - - - - -     Relevant past medical, surgical, family and social history reviewed and updated as indicated. Interim medical history since our last visit reviewed. Allergies and medications reviewed and updated.  Review of Systems  Constitutional: Negative for chills and fever.  Eyes: Negative for visual disturbance.  Respiratory: Negative for chest tightness and shortness of breath.   Cardiovascular: Negative for chest pain and leg swelling.  Genitourinary: Negative for difficulty urinating and dysuria.  Musculoskeletal: Negative for back pain and gait problem.  Skin: Negative for rash.  Neurological: Negative for light-headedness and headaches.  Psychiatric/Behavioral: Negative for agitation and behavioral problems.  All other systems reviewed and are negative.   Per HPI unless specifically indicated above   Allergies as of 07/24/2020      Reactions   Other       Medication List       Accurate as of July 24, 2020 11:18 AM. If you have any questions, ask your nurse or doctor.        amLODipine 5 MG tablet Commonly known as: NORVASC Take 1 tablet (5 mg total) by mouth daily.  DULoxetine 60 MG capsule Commonly known as: CYMBALTA Take 2 capsules (120 mg total) by mouth daily.   fluticasone 50 MCG/ACT nasal spray Commonly known as: FLONASE Place 2 sprays into the nose daily as needed (allergies).   furosemide 40 MG tablet Commonly known as: LASIX Take 1 tablet (40 mg total) by mouth 2 (two) times daily as needed.   gabapentin 300 MG capsule Commonly known as: NEURONTIN Take 1 capsule (300 mg total) by mouth 3 (three) times daily. What changed: Another medication with the same name was removed. Continue taking this medication, and follow the directions  you see here. Changed by: Worthy Rancher, MD   HYDROcodone-acetaminophen 10-325 MG tablet Commonly known as: NORCO Take 1 tablet by mouth every 6 (six) hours as needed for moderate pain.   losartan 100 MG tablet Commonly known as: COZAAR Take 1 tablet (100 mg total) by mouth daily.   meloxicam 7.5 MG tablet Commonly known as: MOBIC TAKE 1 TABLET BY MOUTH EVERY DAY   omeprazole 40 MG capsule Commonly known as: PRILOSEC Take 40 mg by mouth every morning.   pravastatin 80 MG tablet Commonly known as: PRAVACHOL Take 1 tablet (80 mg total) by mouth daily.        Objective:   BP (!) 123/58   Pulse 74   Ht 5' 5.5" (1.664 m)   Wt 270 lb (122.5 kg)   SpO2 93%   BMI 44.25 kg/m   Wt Readings from Last 3 Encounters:  07/24/20 270 lb (122.5 kg)  04/24/20 268 lb (121.6 kg)  01/24/20 269 lb (122 kg)    Physical Exam Vitals and nursing note reviewed.  Constitutional:      General: She is not in acute distress.    Appearance: She is well-developed. She is not diaphoretic.  Eyes:     Conjunctiva/sclera: Conjunctivae normal.  Cardiovascular:     Rate and Rhythm: Normal rate and regular rhythm.     Heart sounds: Normal heart sounds. No murmur heard.   Pulmonary:     Effort: Pulmonary effort is normal. No respiratory distress.     Breath sounds: Normal breath sounds. No wheezing.  Skin:    General: Skin is warm and dry.     Findings: No rash.  Neurological:     Mental Status: She is alert and oriented to person, place, and time.     Coordination: Coordination normal.  Psychiatric:        Behavior: Behavior normal.       Assessment & Plan:   Problem List Items Addressed This Visit      Cardiovascular and Mediastinum   Essential hypertension, benign   Relevant Medications   amLODipine (NORVASC) 5 MG tablet   losartan (COZAAR) 100 MG tablet   pravastatin (PRAVACHOL) 80 MG tablet   Other Relevant Orders   CBC with Differential/Platelet   CMP14+EGFR   Lipid  panel   Bayer DCA Hb A1c Waived     Endocrine   DM type 2 with diabetic dyslipidemia ([place]) - Primary   Relevant Medications   losartan (COZAAR) 100 MG tablet   pravastatin (PRAVACHOL) 80 MG tablet   Other Relevant Orders   CBC with Differential/Platelet   CMP14+EGFR   Lipid panel   Bayer DCA Hb A1c Waived     Other   Hyperlipidemia   Relevant Medications   amLODipine (NORVASC) 5 MG tablet   losartan (COZAAR) 100 MG tablet   pravastatin (PRAVACHOL) 80 MG tablet   Other Relevant  Orders   CBC with Differential/Platelet   CMP14+EGFR   Lipid panel   Bayer DCA Hb A1c Waived   Recurrent major depressive disorder, in full remission (HCC)   Relevant Medications   DULoxetine (CYMBALTA) 60 MG capsule   Fibromyalgia   Relevant Medications   DULoxetine (CYMBALTA) 60 MG capsule      Continue current medications, no changes, A1c 6.7, this is up slightly but still within range, focus on diet.  Patient has been having some left ankle issues and pain and she did see [facility] needed an injection last week and is no better so she will call them back up.  Patient is still having muscle aches and pains all over her body, she is taking the gabapentin and the Cymbalta, she says the gabapentin just makes her sleepy but the Cymbalta does help some and she would like to keep the current dose.  Bone density showed osteopenia with -2.1. Suggest 800 international units of vitamin D daily and 1200 mg elemental calcium for osteopenia or osteoporosis.   Follow up plan: Return in about 3 months (around 10/24/2020), or if symptoms worsen or fail to improve, for Diabetes and hypertension and cholesterol.  Counseling provided for all of the vaccine components Orders Placed This Encounter  Procedures  . CBC with Differential/Platelet  . CMP14+EGFR  . Lipid panel  . Bayer [facility] Hb A1c [place], MD [facility] 07/24/2020, 11:18 AM

## 2020-07-24 NOTE — Patient Instructions (Signed)
Suggest 800 international units of vitamin D daily and 1200 mg elemental calcium for osteopenia or osteoporosis.   

## 2020-07-25 LAB — CBC WITH DIFFERENTIAL/PLATELET
Basophils Absolute: 0 10*3/uL (ref 0.0–0.2)
Basos: 0 %
EOS (ABSOLUTE): 0.2 10*3/uL (ref 0.0–0.4)
Eos: 2 %
Hematocrit: 37.8 % (ref 34.0–46.6)
Hemoglobin: 12.7 g/dL (ref 11.1–15.9)
Immature Grans (Abs): 0 10*3/uL (ref 0.0–0.1)
Immature Granulocytes: 0 %
Lymphocytes Absolute: 3.4 10*3/uL — ABNORMAL HIGH (ref 0.7–3.1)
Lymphs: 29 %
MCH: 30.5 pg (ref 26.6–33.0)
MCHC: 33.6 g/dL (ref 31.5–35.7)
MCV: 91 fL (ref 79–97)
Monocytes Absolute: 0.7 10*3/uL (ref 0.1–0.9)
Monocytes: 6 %
Neutrophils Absolute: 7.3 10*3/uL — ABNORMAL HIGH (ref 1.4–7.0)
Neutrophils: 63 %
Platelets: 195 10*3/uL (ref 150–450)
RBC: 4.17 x10E6/uL (ref 3.77–5.28)
RDW: 13.1 % (ref 11.7–15.4)
WBC: 11.6 10*3/uL — ABNORMAL HIGH (ref 3.4–10.8)

## 2020-07-25 LAB — CMP14+EGFR
ALT: 17 IU/L (ref 0–32)
AST: 18 IU/L (ref 0–40)
Albumin/Globulin Ratio: 2 (ref 1.2–2.2)
Albumin: 4.5 g/dL (ref 3.7–4.7)
Alkaline Phosphatase: 134 IU/L — ABNORMAL HIGH (ref 44–121)
BUN/Creatinine Ratio: 22 (ref 12–28)
BUN: 16 mg/dL (ref 8–27)
Bilirubin Total: 0.4 mg/dL (ref 0.0–1.2)
CO2: 27 mmol/L (ref 20–29)
Calcium: 9.2 mg/dL (ref 8.7–10.3)
Chloride: 100 mmol/L (ref 96–106)
Creatinine, Ser: 0.72 mg/dL (ref 0.57–1.00)
Globulin, Total: 2.2 g/dL (ref 1.5–4.5)
Glucose: 134 mg/dL — ABNORMAL HIGH (ref 65–99)
Potassium: 3.9 mmol/L (ref 3.5–5.2)
Sodium: 141 mmol/L (ref 134–144)
Total Protein: 6.7 g/dL (ref 6.0–8.5)
eGFR: 87 mL/min/{1.73_m2} (ref >59)

## 2020-07-25 LAB — LIPID PANEL
Chol/HDL Ratio: 2.9 ratio (ref 0.0–4.4)
Cholesterol, Total: 159 mg/dL (ref 100–199)
HDL: 55 mg/dL (ref >39)
LDL Chol Calc (NIH): 73 mg/dL (ref 0–99)
Triglycerides: 187 mg/dL — ABNORMAL HIGH (ref 0–149)
VLDL Cholesterol Cal: 31 mg/dL (ref 5–40)

## 2020-08-01 DIAGNOSIS — M5137 Other intervertebral disc degeneration, lumbosacral region: Secondary | ICD-10-CM | POA: Diagnosis not present

## 2020-08-01 DIAGNOSIS — M1712 Unilateral primary osteoarthritis, left knee: Secondary | ICD-10-CM | POA: Diagnosis not present

## 2020-08-01 DIAGNOSIS — G894 Chronic pain syndrome: Secondary | ICD-10-CM | POA: Diagnosis not present

## 2020-08-01 DIAGNOSIS — M47817 Spondylosis without myelopathy or radiculopathy, lumbosacral region: Secondary | ICD-10-CM | POA: Diagnosis not present

## 2020-08-03 ENCOUNTER — Other Ambulatory Visit: Payer: Self-pay

## 2020-08-03 ENCOUNTER — Ambulatory Visit: Payer: Medicare Other | Admitting: Podiatry

## 2020-08-03 DIAGNOSIS — M76822 Posterior tibial tendinitis, left leg: Secondary | ICD-10-CM

## 2020-08-03 DIAGNOSIS — M109 Gout, unspecified: Secondary | ICD-10-CM

## 2020-08-03 MED ORDER — PREDNISONE 10 MG PO TABS: ORAL_TABLET | ORAL | 0 refills | Status: DC

## 2020-08-03 MED ORDER — TRIAMCINOLONE ACETONIDE 10 MG/ML IJ SUSP
10.0000 mg | Freq: Once | INTRAMUSCULAR | Status: AC
Start: 2020-08-03 — End: 2020-08-03
  Administered 2020-08-03: 10 mg

## 2020-08-03 NOTE — Progress Notes (Signed)
Subjective:   Patient ID: Tiffany Brown, female   DOB: 75 y.o.   MRN: [id number]   HPI Patient presents with caregiver stating the left ankle is still very sore in both her feet are really bothering her a lot but the left ankle still very bad and did not respond to the initial injection   ROS      Objective:  Physical Exam  Neurovascular status intact with patient's left foot showing swelling and discomfort and patient is obese which is complicating factor along with significant stasis venous disease.  Patient also has history of gout     Assessment:  Difficult to make determinations as to why she is getting the pain she is getting but does appear to be inflammatory with strong possibility for gout-like symptomatology      Plan:  H&P condition reviewed and for the left I went ahead I did do 1 more steroid injection of the ankle joint and posterior tibial tendon group 3 mg Dexasone Kenalog 5 mg Xylocaine applied sterile dressing.  I then discussed gout and we are going to start her on a Sterapred DS Dosepak as she is done well with them in the past and I did discuss to cause her any problem to stop it but I am hoping this will help her overall condition as she cannot be immobilized and there is not very many other things we can do

## 2020-08-11 ENCOUNTER — Ambulatory Visit
Admission: RE | Admit: 2020-08-11 | Discharge: 2020-08-11 | Disposition: A | Discharge status: Home / Self Care | Payer: Medicare Other | Source: Ambulatory Visit | Attending: Family Medicine | Admitting: Family Medicine

## 2020-08-11 ENCOUNTER — Other Ambulatory Visit: Payer: Self-pay

## 2020-08-11 DIAGNOSIS — Z1231 Encounter for screening mammogram for malignant neoplasm of breast: Secondary | ICD-10-CM | POA: Diagnosis not present

## 2020-08-30 DIAGNOSIS — S39012A Strain of muscle, fascia and tendon of lower back, initial encounter: Secondary | ICD-10-CM | POA: Diagnosis not present

## 2020-08-30 DIAGNOSIS — I700 Atherosclerosis of aorta: Secondary | ICD-10-CM | POA: Diagnosis not present

## 2020-08-30 DIAGNOSIS — M5137 Other intervertebral disc degeneration, lumbosacral region: Secondary | ICD-10-CM | POA: Diagnosis not present

## 2020-08-30 DIAGNOSIS — I10 Essential (primary) hypertension: Secondary | ICD-10-CM | POA: Diagnosis not present

## 2020-08-30 DIAGNOSIS — S8992XA Unspecified injury of left lower leg, initial encounter: Secondary | ICD-10-CM | POA: Diagnosis not present

## 2020-08-30 DIAGNOSIS — W19XXXA Unspecified fall, initial encounter: Secondary | ICD-10-CM | POA: Diagnosis not present

## 2020-08-30 DIAGNOSIS — R296 Repeated falls: Secondary | ICD-10-CM | POA: Diagnosis not present

## 2020-08-30 DIAGNOSIS — Z79899 Other long term (current) drug therapy: Secondary | ICD-10-CM | POA: Diagnosis not present

## 2020-08-30 DIAGNOSIS — Z9181 History of falling: Secondary | ICD-10-CM | POA: Diagnosis not present

## 2020-08-30 DIAGNOSIS — M545 Low back pain, unspecified: Secondary | ICD-10-CM | POA: Diagnosis not present

## 2020-08-30 DIAGNOSIS — M47816 Spondylosis without myelopathy or radiculopathy, lumbar region: Secondary | ICD-10-CM | POA: Diagnosis not present

## 2020-08-30 DIAGNOSIS — M1712 Unilateral primary osteoarthritis, left knee: Secondary | ICD-10-CM | POA: Diagnosis not present

## 2020-09-05 DIAGNOSIS — M5137 Other intervertebral disc degeneration, lumbosacral region: Secondary | ICD-10-CM | POA: Diagnosis not present

## 2020-09-05 DIAGNOSIS — M47817 Spondylosis without myelopathy or radiculopathy, lumbosacral region: Secondary | ICD-10-CM | POA: Diagnosis not present

## 2020-09-05 DIAGNOSIS — M1712 Unilateral primary osteoarthritis, left knee: Secondary | ICD-10-CM | POA: Diagnosis not present

## 2020-09-05 DIAGNOSIS — G894 Chronic pain syndrome: Secondary | ICD-10-CM | POA: Diagnosis not present

## 2020-09-05 DIAGNOSIS — Z79899 Other long term (current) drug therapy: Secondary | ICD-10-CM | POA: Diagnosis not present

## 2020-09-05 DIAGNOSIS — Z79891 Long term (current) use of opiate analgesic: Secondary | ICD-10-CM | POA: Diagnosis not present

## 2020-09-06 DIAGNOSIS — M9904 Segmental and somatic dysfunction of sacral region: Secondary | ICD-10-CM | POA: Diagnosis not present

## 2020-09-06 DIAGNOSIS — M9903 Segmental and somatic dysfunction of lumbar region: Secondary | ICD-10-CM | POA: Diagnosis not present

## 2020-09-06 DIAGNOSIS — M5137 Other intervertebral disc degeneration, lumbosacral region: Secondary | ICD-10-CM | POA: Diagnosis not present

## 2020-09-06 DIAGNOSIS — M9905 Segmental and somatic dysfunction of pelvic region: Secondary | ICD-10-CM | POA: Diagnosis not present

## 2020-09-07 DIAGNOSIS — M5137 Other intervertebral disc degeneration, lumbosacral region: Secondary | ICD-10-CM | POA: Diagnosis not present

## 2020-09-07 DIAGNOSIS — M9904 Segmental and somatic dysfunction of sacral region: Secondary | ICD-10-CM | POA: Diagnosis not present

## 2020-09-07 DIAGNOSIS — M9903 Segmental and somatic dysfunction of lumbar region: Secondary | ICD-10-CM | POA: Diagnosis not present

## 2020-09-07 DIAGNOSIS — M9905 Segmental and somatic dysfunction of pelvic region: Secondary | ICD-10-CM | POA: Diagnosis not present

## 2020-09-11 DIAGNOSIS — M5137 Other intervertebral disc degeneration, lumbosacral region: Secondary | ICD-10-CM | POA: Diagnosis not present

## 2020-09-11 DIAGNOSIS — M9905 Segmental and somatic dysfunction of pelvic region: Secondary | ICD-10-CM | POA: Diagnosis not present

## 2020-09-11 DIAGNOSIS — M9904 Segmental and somatic dysfunction of sacral region: Secondary | ICD-10-CM | POA: Diagnosis not present

## 2020-09-11 DIAGNOSIS — M9903 Segmental and somatic dysfunction of lumbar region: Secondary | ICD-10-CM | POA: Diagnosis not present

## 2020-09-15 ENCOUNTER — Ambulatory Visit (INDEPENDENT_AMBULATORY_CARE_PROVIDER_SITE_OTHER): Payer: Medicare Other

## 2020-09-15 ENCOUNTER — Encounter: Payer: Self-pay | Admitting: Podiatry

## 2020-09-15 ENCOUNTER — Ambulatory Visit: Payer: Medicare Other | Admitting: Podiatry

## 2020-09-15 ENCOUNTER — Other Ambulatory Visit: Payer: Self-pay

## 2020-09-15 DIAGNOSIS — M7671 Peroneal tendinitis, right leg: Secondary | ICD-10-CM | POA: Diagnosis not present

## 2020-09-15 DIAGNOSIS — R600 Localized edema: Secondary | ICD-10-CM | POA: Diagnosis not present

## 2020-09-15 DIAGNOSIS — M79671 Pain in right foot: Secondary | ICD-10-CM

## 2020-09-15 MED ORDER — TRIAMCINOLONE ACETONIDE 10 MG/ML IJ SUSP
10.0000 mg | Freq: Once | INTRAMUSCULAR | Status: AC
  Administered 2020-09-15: 10 mg

## 2020-09-17 NOTE — Progress Notes (Signed)
Subjective:   Patient ID: Tiffany Brown, female   DOB: 75 y.o.   MRN: [id number]   HPI Patient states she has developed a lot of pain in the outside of her right foot and has swelling in her foot which is relatively consistent but seems like it is worse recently.  States that it makes it hard for her to walk and she does not remember any other pathology with no history of gout   ROS      Objective:  Physical Exam  Neurovascular status intact negative Bevelyn Buckles' sign was noted with forefoot midfoot edema right +2 pitting edema +1 in the ankle.  There is exquisite discomfort lateral side of the foot around the peroneal base and it is painful when pressed     Assessment:  Possibility for an inflammatory condition peroneal versus systemic condition with no history of systemic disease     Plan:  H&P organ to start conservatively and I did sterile prep and injected the peroneal tendon complex 3 mg Kenalog 5 mg Xylocaine applied Unna boot to try to reduce the swelling along with surgical shoe Ace wrap and instructed leaving on 3 days and take it off earlier if any digital swelling were to occur.  If symptoms were to occur we will get blood work and consider other pathology but hopefully this will knock it out  X-rays were negative for fracture or did not indicate arthritis around this area

## 2020-09-18 DIAGNOSIS — M9905 Segmental and somatic dysfunction of pelvic region: Secondary | ICD-10-CM | POA: Diagnosis not present

## 2020-09-18 DIAGNOSIS — M9904 Segmental and somatic dysfunction of sacral region: Secondary | ICD-10-CM | POA: Diagnosis not present

## 2020-09-18 DIAGNOSIS — M9903 Segmental and somatic dysfunction of lumbar region: Secondary | ICD-10-CM | POA: Diagnosis not present

## 2020-09-18 DIAGNOSIS — M5137 Other intervertebral disc degeneration, lumbosacral region: Secondary | ICD-10-CM | POA: Diagnosis not present

## 2020-09-20 ENCOUNTER — Ambulatory Visit (INDEPENDENT_AMBULATORY_CARE_PROVIDER_SITE_OTHER): Payer: Medicare Other

## 2020-09-20 VITALS — Ht 66.0 in | Wt 270.0 lb

## 2020-09-20 DIAGNOSIS — K30 Functional dyspepsia: Secondary | ICD-10-CM | POA: Insufficient documentation

## 2020-09-20 DIAGNOSIS — Z0000 Encounter for general adult medical examination without abnormal findings: Secondary | ICD-10-CM | POA: Diagnosis not present

## 2020-09-20 DIAGNOSIS — K5900 Constipation, unspecified: Secondary | ICD-10-CM | POA: Insufficient documentation

## 2020-09-20 NOTE — Progress Notes (Signed)
Subjective:   Surena Welge Kishi is a 75 y.o. female who presents for Medicare Annual (Subsequent) preventive examination.  Virtual Visit via Telephone Note  I connected with  Darnice C Kempner on 09/20/20 at  9:00 AM EDT by telephone and verified that I am speaking with the correct person using two identifiers.  Location: Patient: Home Provider: [facility] Persons participating in the virtual visit: patient/Nurse Health Advisor   I discussed the limitations, risks, security and privacy concerns of performing an evaluation and management service by telephone and the availability of in person appointments. The patient expressed understanding and agreed to proceed.  Interactive audio and video telecommunications were attempted between this nurse and patient, however failed, due to patient having technical difficulties OR patient did not have access to video capability.  We continued and completed visit with audio only.  Some vital signs may be absent or patient reported.   Abhishek Levesque E Ilay Capshaw, LPN   Review of Systems     Cardiac Risk Factors include: advanced age (>39men, >58 women);diabetes mellitus;dyslipidemia;hypertension;sedentary lifestyle;obesity (BMI >30kg/m2)     Objective:    Today's Vitals   09/20/20 0910  Weight: 270 lb (122.5 kg)  Height: 5\' 6"  (1.676 m)  PainSc: 6    Body mass index is 43.58 kg/m.  Advanced Directives 09/20/2020 07/22/2019 06/07/2019 04/07/2019 02/25/2017 01/20/2017 06/16/2014  Does Patient Have a Medical Advance Directive? No Yes No No No No No  Type of Advance Directive - Living will - - - - -  Does patient want to make changes to medical advance directive? - No - Patient declined - - - - -  Would patient like information on creating a medical advance directive? Yes (MAU/Ambulatory/Procedural Areas - Information given) - No - Patient declined - No - Patient declined Yes (ED - Information included in AVS) -    Current Medications (verified) Outpatient  Encounter Medications as of 09/20/2020  Medication Sig  . amLODipine (NORVASC) 5 MG tablet Take 1 tablet (5 mg total) by mouth daily.  . DULoxetine (CYMBALTA) 60 MG capsule Take 2 capsules (120 mg total) by mouth daily.  . fluticasone (FLONASE) 50 MCG/ACT nasal spray Place 2 sprays into the nose daily as needed (allergies).   . furosemide (LASIX) 40 MG tablet Take 1 tablet (40 mg total) by mouth 2 (two) times daily as needed.  . gabapentin (NEURONTIN) 300 MG capsule Take 1 capsule (300 mg total) by mouth 3 (three) times daily.  Marland Kitchen HYDROcodone-acetaminophen (NORCO) 10-325 MG per tablet Take 1 tablet by mouth every 6 (six) hours as needed for moderate pain.   Marland Kitchen losartan (COZAAR) 100 MG tablet Take 1 tablet (100 mg total) by mouth daily.  . meloxicam (MOBIC) 7.5 MG tablet TAKE 1 TABLET BY MOUTH EVERY DAY  . methocarbamol (ROBAXIN) 750 MG tablet Take 750 mg by mouth 2 (two) times daily.  Marland Kitchen omeprazole (PRILOSEC) 40 MG capsule Take 40 mg by mouth every morning.  . pravastatin (PRAVACHOL) 80 MG tablet Take 1 tablet (80 mg total) by mouth daily.  . traMADol (ULTRAM-ER) 100 MG 24 hr tablet Take 100 mg by mouth daily.  . predniSONE (DELTASONE) 10 MG tablet 12 day tapering dose (Patient not taking: Reported on 09/20/2020)   No facility-administered encounter medications on file as of 09/20/2020.    Allergies (verified) Other   History: Past Medical History:  Diagnosis Date  . Abnormal glucose   . Depression with anxiety   . Essential hypertension, benign   . Fibromyalgia   .  Melanoma ([place])   . Near syncope   . Other and unspecified hyperlipidemia    Past Surgical History:  Procedure Laterality Date  . ABDOMINAL HYSTERECTOMY    . BREAST SURGERY     LEFT  . CHOLECYSTECTOMY    . ELBOW SURGERY     X's 3 Left  . RIGHT DISTAL RADIUS FRACTURE    . TOTAL KNEE ARTHROPLASTY     RIGHT   Family History  Problem Relation Age of Onset  . Cancer Mother        lymphoma  . Heart disease Maternal  Grandmother        no details  . Suicidality Father   . Lung cancer Sister    Social History   Socioeconomic History  . Marital status: Married    Spouse name: jr  . Number of children: 3  . Years of education: 23  . Highest education level: 12th grade  Occupational History  . Occupation: Homemaker  Tobacco Use  . Smoking status: Former Smoker    Packs/day: 0.80    Years: 25.00    Pack years: 20.00    Types: Cigarettes    Quit date: 05/06/1994    Years since quitting: 26.3  . Smokeless tobacco: Never Used  Vaping Use  . Vaping Use: Never used  Substance and Sexual Activity  . Alcohol use: Never  . Drug use: Never  . Sexual activity: Yes    Birth control/protection: Post-menopausal    Comment: Married since 1964  Other Topics Concern  . Not on file  Social History Narrative   Has 3 children. Lives with husband. One daughter lives close by   Social Determinants of Health   Financial Resource Strain: Low Risk   . Difficulty of Paying Living Expenses: Not hard at all  Food Insecurity: No Food Insecurity  . Worried About Charity fundraiser in the Last Year: Never true  . Ran Out of Food in the Last Year: Never true  Transportation Needs: No Transportation Needs  . Lack of Transportation (Medical): No  . Lack of Transportation (Non-Medical): No  Physical Activity: Inactive  . Days of Exercise per Week: 0 days  . Minutes of Exercise per Session: 0 min  Stress: Stress Concern Present  . Feeling of Stress : Rather much  Social Connections: Socially Integrated  . Frequency of Communication with Friends and Family: More than three times a week  . Frequency of Social Gatherings with Friends and Family: Once a week  . Attends Religious Services: 1 to 4 times per year  . Active Member of Clubs or Organizations: Yes  . Attends Archivist Meetings: 1 to 4 times per year  . Marital Status: Married    Tobacco Counseling Counseling given: Not Answered   Clinical  Intake:  Pre-visit preparation completed: Yes  Pain : 0-10 Pain Score: 6  Pain Type: Acute pain Pain Location: Ankle Pain Orientation: Anterior,Right Pain Descriptors / Indicators: Aching,Sharp Pain Onset: 1 to 4 weeks ago Pain Frequency: Intermittent     BMI - recorded: 43.58 Nutritional Status: BMI > 30  Obese Nutritional Risks: None Diabetes: Yes CBG done?: No Did pt. bring in CBG monitor from home?: No  How often do you need to have someone help you when you read instructions, pamphlets, or other written materials from your doctor or pharmacy?: 1 - Never  Nutrition Risk Assessment:  Has the patient had any N/V/D within the last 2 months?  Yes  Does the  patient have any non-healing wounds?  No  Has the patient had any unintentional weight loss or weight gain?  No   Diabetes:  Is the patient diabetic?  Yes  If diabetic, was a CBG obtained today?  No  Did the patient bring in their glucometer from home?  No  How often do you monitor your CBG's? Doesn't check at home.   Financial Strains and Diabetes Management:  Are you having any financial strains with the device, your supplies or your medication? No .  Does the patient want to be seen by Chronic Care Management for management of their diabetes?  No  Would the patient like to be referred to a Nutritionist or for Diabetic Management?  No   Diabetic Exams:  Diabetic Eye Exam: Completed 02/17/2020.  Diabetic Foot Exam: Completed 2021. Pt has been advised about the importance in completing this exam. Pt is scheduled for diabetic foot exam on 10/25/2020.    Interpreter Needed?: No  Information entered by :: Marcio Hoque, LPN   Activities of Daily Living In your present state of health, do you have any difficulty performing the following activities: 09/20/2020  Hearing? N  Vision? N  Difficulty concentrating or making decisions? N  Walking or climbing stairs? Y  Dressing or bathing? N  Doing errands, shopping? N   Preparing Food and eating ? N  Using the Toilet? N  In the past six months, have you accidently leaked urine? Y  Comment wears pads for protection  Do you have problems with loss of bowel control? N  Managing your Medications? N  Managing your Finances? N  Housekeeping or managing your Housekeeping? N  Some recent data might be hidden    Patient Care Team: Dettinger, Fransisca Kaufmann, MD as PCP - General (Family Medicine) Regal, Tamala Fothergill, DPM as Consulting Physician (Podiatry)  Indicate any recent Medical Services you may have received from other than [facility] providers in the past year (date may be approximate).     Assessment:   This is a routine wellness examination for Manda.  Hearing/Vision screen  Hearing Screening   125Hz  250Hz  500Hz  1000Hz  2000Hz  3000Hz  4000Hz  6000Hz  8000Hz   Right ear:           Left ear:           Comments: Denies hearing difficulties  Vision Screening Comments: Wears reading glasses - Annual visits with [facility] in [place] - up to date with eye exam  Dietary issues and exercise activities discussed: Current Exercise Habits: The patient does not participate in regular exercise at present, Exercise limited by: orthopedic condition(s)  Goals Addressed              This Visit's Progress   .  Increase physical activity (pt-stated)   Not on track     Gradually as Lymphedema gets better.    .  Prevent falls (pt-stated)   On track     Now walking with walker, be aware of surroundings - Increase exercise as tolerated, lose weight , be able to get around easier without walker      Depression Screen PHQ 2/9 Scores 09/20/2020 07/24/2020 04/24/2020 01/24/2020 10/20/2019 07/22/2019 03/31/2019  PHQ - 2 Score 3 2 2 6 6 2 4   PHQ- 9 Score 14 12 12 18 19 7 16     Fall Risk Fall Risk  09/20/2020 07/24/2020 07/24/2020 04/24/2020 01/24/2020  Falls in the past year? 1 1 0 0 1  Number falls in past yr: 1  0 - - 1  Injury with Fall? 0 0 - - 0  Risk for fall due to :  Impaired balance/gait;History of fall(s);Orthopedic patient;Impaired vision;Medication side effect Impaired balance/gait - - Impaired balance/gait  Follow up Education provided;Falls prevention discussed Falls evaluation completed - - Falls evaluation completed    FALL RISK PREVENTION PERTAINING TO THE HOME:  Any stairs in or around the home? No  If so, are there any without handrails? No  Home free of loose throw rugs in walkways, pet beds, electrical cords, etc? Yes  Adequate lighting in your home to reduce risk of falls? Yes   ASSISTIVE DEVICES UTILIZED TO PREVENT FALLS:  Life alert? No  Use of a cane, walker or w/c? Yes  Grab bars in the bathroom? Yes  Shower chair or bench in shower? Yes  Elevated toilet seat or a handicapped toilet? Yes   TIMED UP AND GO:  Was the test performed? No . Telephonic visit.  Cognitive Function: Normal cognitive status assessed by direct observation by this Nurse Health Advisor. No abnormalities found.       6CIT Screen 07/22/2019 07/22/2019  What Year? 0 points 0 points  What month? 0 points 0 points  What time? 0 points -  Count back from 20 0 points -  Months in reverse 2 points -  Repeat phrase 0 points -  Total Score 2 -    Immunizations Immunization History  Administered Date(s) Administered  . Fluad Quad(high Dose 65+) 01/24/2020  . Influenza, High Dose Seasonal PF 03/06/2016, 01/28/2017, 02/12/2018, 02/25/2019  . Influenza,trivalent, recombinat, inj, PF 02/10/2014, 02/07/2015  . Influenza-Unspecified 02/10/2014, 02/07/2015  . Moderna Sars-Covid-2 Vaccination 06/30/2019, 07/28/2019, 03/07/2020  . Pneumococcal Conjugate-13 07/03/2015  . Pneumococcal Polysaccharide-23 08/05/2016  . Td,absorbed, Preservative Free, Adult Use, Lf Unspecified 11/16/2004  . Tdap 02/10/2014, 05/03/2019    TDAP status: Up to date  Flu Vaccine status: Up to date  Pneumococcal vaccine status: Up to date  Covid-19 vaccine status: Completed  vaccines  Qualifies for Shingles Vaccine? Yes   Zostavax completed No   Shingrix Completed?: No.    Education has been provided regarding the importance of this vaccine. Patient has been advised to call insurance company to determine out of pocket expense if they have not yet received this vaccine. Advised may also receive vaccine at local pharmacy or Health Dept. Verbalized acceptance and understanding.  Screening Tests Health Maintenance  Topic Date Due  . Hepatitis C Screening  04/24/2021 (Originally 11/02/1963)  . INFLUENZA VACCINE  12/04/2020  . HEMOGLOBIN A1C  01/24/2021  . MAMMOGRAM  08/11/2021  . DEXA SCAN  05/02/2022  . COLONOSCOPY (Pts 45-71yrs Insurance coverage will need to be confirmed)  05/07/2022  . TETANUS/TDAP  05/02/2029  . COVID-19 Vaccine  Completed  . PNA vac Low Risk Adult  Completed  . HPV VACCINES  Aged Out  . FOOT EXAM  Discontinued  . OPHTHALMOLOGY EXAM  Discontinued    Health Maintenance  There are no preventive care reminders to display for this patient.  Colorectal cancer screening: Type of screening: Colonoscopy. Completed 05/07/2012. Repeat every 10 years  Mammogram status: Completed 08/11/20. Repeat every year  Bone Density status: Completed 05/02/2020. Results reflect: Bone density results: OSTEOPENIA. Repeat every 2 years.  Lung Cancer Screening: (Low Dose CT Chest recommended if Age 64-80 years, 30 pack-year currently smoking OR have quit w/in 15years.) does not qualify.   Additional Screening:  Hepatitis C Screening: does qualify; Completed 07/31/2016  Vision Screening: Recommended annual ophthalmology  exams for early detection of glaucoma and other disorders of the eye. Is the patient up to date with their annual eye exam?  Yes  Who is the provider or what is the name of the office in which the patient attends annual eye exams? [facility] If pt is not established with a provider, would they like to be referred to a provider to establish  care? No .   Dental Screening: Recommended annual dental exams for proper oral hygiene  Community Resource Referral / Chronic Care Management: CRR required this visit?  No   CCM required this visit?  No      Plan:     I have personally reviewed and noted the following in the patient's chart:   . Medical and social history . Use of alcohol, tobacco or illicit drugs  . Current medications and supplements including opioid prescriptions.  . Functional ability and status . Nutritional status . Physical activity . Advanced directives . List of other physicians . Hospitalizations, surgeries, and ER visits in previous 12 months . Vitals . Screenings to include cognitive, depression, and falls . Referrals and appointments  In addition, I have reviewed and discussed with patient certain preventive protocols, quality metrics, and best practice recommendations. A written personalized care plan for preventive services as well as general preventive health recommendations were provided to patient.     Sandrea Hammond, LPN   1/61/0960   Nurse Notes: None

## 2020-09-20 NOTE — Patient Instructions (Signed)
Tiffany Brown , Thank you for taking time to come for your Medicare Wellness Visit. I appreciate your ongoing commitment to your health goals. Please review the following plan we discussed and let me know if I can assist you in the future.   Screening recommendations/referrals: Colonoscopy: Done 05/07/2012 - Repeat 10 years (also did cologuard in the last year, but we need results) Mammogram: Done 08/11/20 - Repeat annually Bone Density: Done 05/02/2020 - Repeat every 2 years Recommended yearly ophthalmology/optometry visit for glaucoma screening and checkup Recommended yearly dental visit for hygiene and checkup  Vaccinations: Influenza vaccine: Done 01/24/2020 - Repeat annually Pneumococcal vaccine: Done 07/03/2015 & 08/05/2016 Tdap vaccine: Done 05/03/2019 - Repeat in 10 years Shingles vaccine: Shingrix discussed. Please contact your pharmacy for coverage information.    Covid-19:Done 06/30/2019, 07/28/2019, & 03/07/2020 - due for second booster  Advanced directives: Advance directive discussed with you today. I have provided a copy for you to complete at home and have notarized. Once this is complete please bring a copy in to our office so we can scan it into your chart.  Conditions/risks identified: Aim for 30 minutes of exercise and stretches each day, drink 6-8 glasses of water and eat lots of fruits and vegetables. Increase physical activity as tolerated - work on fall prevention.  Next appointment: Follow up in one year for your annual wellness visit    Preventive Care 65 Years and Older, Female Preventive care refers to lifestyle choices and visits with your health care provider that can promote health and wellness. What does preventive care include?  A yearly physical exam. This is also called an annual well check.  Dental exams once or twice a year.  Routine eye exams. Ask your health care provider how often you should have your eyes checked.  Personal lifestyle choices,  including:  Daily care of your teeth and gums.  Regular physical activity.  Eating a healthy diet.  Avoiding tobacco and drug use.  Limiting alcohol use.  Practicing safe sex.  Taking low-dose aspirin every day.  Taking vitamin and mineral supplements as recommended by your health care provider. What happens during an annual well check? The services and screenings done by your health care provider during your annual well check will depend on your age, overall health, lifestyle risk factors, and family history of disease. Counseling  Your health care provider may ask you questions about your:  Alcohol use.  Tobacco use.  Drug use.  Emotional well-being.  Home and relationship well-being.  Sexual activity.  Eating habits.  History of falls.  Memory and ability to understand (cognition).  Work and work Statistician.  Reproductive health. Screening  You may have the following tests or measurements:  Height, weight, and BMI.  Blood pressure.  Lipid and cholesterol levels. These may be checked every 5 years, or more frequently if you are over 70 years old.  Skin check.  Lung cancer screening. You may have this screening every year starting at age 23 if you have a 30-pack-year history of smoking and currently smoke or have quit within the past 15 years.  Fecal occult blood test (FOBT) of the stool. You may have this test every year starting at age 32.  Flexible sigmoidoscopy or colonoscopy. You may have a sigmoidoscopy every 5 years or a colonoscopy every 10 years starting at age 35.  Hepatitis C blood test.  Hepatitis B blood test.  Sexually transmitted disease (STD) testing.  Diabetes screening. This is done by checking your  blood sugar (glucose) after you have not eaten for a while (fasting). You may have this done every 1-3 years.  Bone density scan. This is done to screen for osteoporosis. You may have this done starting at age 61.  Mammogram. This  may be done every 1-2 years. Talk to your health care provider about how often you should have regular mammograms. Talk with your health care provider about your test results, treatment options, and if necessary, the need for more tests. Vaccines  Your health care provider may recommend certain vaccines, such as:  Influenza vaccine. This is recommended every year.  Tetanus, diphtheria, and acellular pertussis (Tdap, Td) vaccine. You may need a Td booster every 10 years.  Zoster vaccine. You may need this after age 40.  Pneumococcal 13-valent conjugate (PCV13) vaccine. One dose is recommended after age 46.  Pneumococcal polysaccharide (PPSV23) vaccine. One dose is recommended after age 51. Talk to your health care provider about which screenings and vaccines you need and how often you need them. This information is not intended to replace advice given to you by your health care provider. Make sure you discuss any questions you have with your health care provider. Document Released: 05/19/2015 Document Revised: 01/10/2016 Document Reviewed: 02/21/2015 Elsevier Interactive Patient Education  2017 [place] Prevention in the Home Falls can cause injuries. They can happen to people of all ages. There are many things you can do to make your home safe and to help prevent falls. What can I do on the outside of my home?  Regularly fix the edges of walkways and driveways and fix any cracks.  Remove anything that might make you trip as you walk through a door, such as a raised step or threshold.  Trim any bushes or trees on the path to your home.  Use bright outdoor lighting.  Clear any walking paths of anything that might make someone trip, such as rocks or tools.  Regularly check to see if handrails are loose or broken. Make sure that both sides of any steps have handrails.  Any raised decks and porches should have guardrails on the edges.  Have any leaves, snow, or ice cleared  regularly.  Use sand or salt on walking paths during winter.  Clean up any spills in your garage right away. This includes oil or grease spills. What can I do in the bathroom?  Use night lights.  Install grab bars by the toilet and in the tub and shower. Do not use towel bars as grab bars.  Use non-skid mats or decals in the tub or shower.  If you need to sit down in the shower, use a plastic, non-slip stool.  Keep the floor dry. Clean up any water that spills on the floor as soon as it happens.  Remove soap buildup in the tub or shower regularly.  Attach bath mats securely with double-sided non-slip rug tape.  Do not have throw rugs and other things on the floor that can make you trip. What can I do in the bedroom?  Use night lights.  Make sure that you have a light by your bed that is easy to reach.  Do not use any sheets or blankets that are too big for your bed. They should not hang down onto the floor.  Have a firm chair that has side arms. You can use this for support while you get dressed.  Do not have throw rugs and other things on the floor that  can make you trip. What can I do in the kitchen?  Clean up any spills right away.  Avoid walking on wet floors.  Keep items that you use a lot in easy-to-reach places.  If you need to reach something above you, use a strong step stool that has a grab bar.  Keep electrical cords out of the way.  Do not use floor polish or wax that makes floors slippery. If you must use wax, use non-skid floor wax.  Do not have throw rugs and other things on the floor that can make you trip. What can I do with my stairs?  Do not leave any items on the stairs.  Make sure that there are handrails on both sides of the stairs and use them. Fix handrails that are broken or loose. Make sure that handrails are as long as the stairways.  Check any carpeting to make sure that it is firmly attached to the stairs. Fix any carpet that is loose  or worn.  Avoid having throw rugs at the top or bottom of the stairs. If you do have throw rugs, attach them to the floor with carpet tape.  Make sure that you have a light switch at the top of the stairs and the bottom of the stairs. If you do not have them, ask someone to add them for you. What else can I do to help prevent falls?  Wear shoes that:  Do not have high heels.  Have rubber bottoms.  Are comfortable and fit you well.  Are closed at the toe. Do not wear sandals.  If you use a stepladder:  Make sure that it is fully opened. Do not climb a closed stepladder.  Make sure that both sides of the stepladder are locked into place.  Ask someone to hold it for you, if possible.  Clearly mark and make sure that you can see:  Any grab bars or handrails.  First and last steps.  Where the edge of each step is.  Use tools that help you move around (mobility aids) if they are needed. These include:  Canes.  Walkers.  Scooters.  Crutches.  Turn on the lights when you go into a dark area. Replace any light bulbs as soon as they burn out.  Set up your furniture so you have a clear path. Avoid moving your furniture around.  If any of your floors are uneven, fix them.  If there are any pets around you, be aware of where they are.  Review your medicines with your doctor. Some medicines can make you feel dizzy. This can increase your chance of falling. Ask your doctor what other things that you can do to help prevent falls. This information is not intended to replace advice given to you by your health care provider. Make sure you discuss any questions you have with your health care provider. Document Released: 02/16/2009 Document Revised: 09/28/2015 Document Reviewed: 05/27/2014 Elsevier Interactive Patient Education  2017 [employer].

## 2020-09-21 DIAGNOSIS — M9903 Segmental and somatic dysfunction of lumbar region: Secondary | ICD-10-CM | POA: Diagnosis not present

## 2020-09-21 DIAGNOSIS — M9904 Segmental and somatic dysfunction of sacral region: Secondary | ICD-10-CM | POA: Diagnosis not present

## 2020-09-21 DIAGNOSIS — M5137 Other intervertebral disc degeneration, lumbosacral region: Secondary | ICD-10-CM | POA: Diagnosis not present

## 2020-09-21 DIAGNOSIS — M9905 Segmental and somatic dysfunction of pelvic region: Secondary | ICD-10-CM | POA: Diagnosis not present

## 2020-09-22 ENCOUNTER — Ambulatory Visit: Payer: Medicare Other | Admitting: Podiatry

## 2020-09-22 ENCOUNTER — Other Ambulatory Visit: Payer: Self-pay

## 2020-09-22 DIAGNOSIS — T148XXA Other injury of unspecified body region, initial encounter: Secondary | ICD-10-CM

## 2020-09-22 DIAGNOSIS — R600 Localized edema: Secondary | ICD-10-CM | POA: Diagnosis not present

## 2020-09-22 DIAGNOSIS — M7671 Peroneal tendinitis, right leg: Secondary | ICD-10-CM

## 2020-09-22 MED ORDER — OXYCODONE-ACETAMINOPHEN 10-325 MG PO TABS: 1.0000 | ORAL_TABLET | ORAL | 0 refills | Status: DC | PRN

## 2020-09-22 NOTE — Progress Notes (Signed)
Subjective:   Patient ID: Marella Chimes, female   DOB: 75 y.o.   MRN: [id number]   HPI Patient states she still having a lot of intense pain in the outside of the right foot and that while she was wearing the boot it seemed that the swelling went down but then it came right back and she simply cannot walk on her foot and it is excruciatingly tender   ROS      Objective:  Physical Exam  Neurovascular status intact negative Bevelyn Buckles' sign noted continued exquisite discomfort outside of right foot around the peroneal tendon and into the bone structure itself.  Very difficult condition due to obesity and other issues with poor health making it hard for [place] to treat this      Assessment:  Inflammatory tendinitis possibility of a peroneal tear and possibility for bone issues      Plan:  H&P discussed condition and at this point we need to immobilize it and I explained the importance of doing this.  I placed the patient in a air fracture walker to completely immobilize I discussed ice therapy and I did put her on Percocet due to her intense discomfort to replace Vicodin is that is not helping her.  She will be seen back for [place] to recheck again and is encouraged to call questions concerns

## 2020-09-25 DIAGNOSIS — M9904 Segmental and somatic dysfunction of sacral region: Secondary | ICD-10-CM | POA: Diagnosis not present

## 2020-09-25 DIAGNOSIS — M9903 Segmental and somatic dysfunction of lumbar region: Secondary | ICD-10-CM | POA: Diagnosis not present

## 2020-09-25 DIAGNOSIS — M9905 Segmental and somatic dysfunction of pelvic region: Secondary | ICD-10-CM | POA: Diagnosis not present

## 2020-09-25 DIAGNOSIS — M5137 Other intervertebral disc degeneration, lumbosacral region: Secondary | ICD-10-CM | POA: Diagnosis not present

## 2020-09-28 DIAGNOSIS — M5137 Other intervertebral disc degeneration, lumbosacral region: Secondary | ICD-10-CM | POA: Diagnosis not present

## 2020-09-28 DIAGNOSIS — M9904 Segmental and somatic dysfunction of sacral region: Secondary | ICD-10-CM | POA: Diagnosis not present

## 2020-09-28 DIAGNOSIS — M9903 Segmental and somatic dysfunction of lumbar region: Secondary | ICD-10-CM | POA: Diagnosis not present

## 2020-09-28 DIAGNOSIS — M9905 Segmental and somatic dysfunction of pelvic region: Secondary | ICD-10-CM | POA: Diagnosis not present

## 2020-10-03 ENCOUNTER — Other Ambulatory Visit: Payer: Self-pay | Admitting: Podiatry

## 2020-10-03 DIAGNOSIS — M7671 Peroneal tendinitis, right leg: Secondary | ICD-10-CM

## 2020-10-04 DIAGNOSIS — G894 Chronic pain syndrome: Secondary | ICD-10-CM | POA: Diagnosis not present

## 2020-10-04 DIAGNOSIS — M47812 Spondylosis without myelopathy or radiculopathy, cervical region: Secondary | ICD-10-CM | POA: Diagnosis not present

## 2020-10-04 DIAGNOSIS — M1712 Unilateral primary osteoarthritis, left knee: Secondary | ICD-10-CM | POA: Diagnosis not present

## 2020-10-04 DIAGNOSIS — M5137 Other intervertebral disc degeneration, lumbosacral region: Secondary | ICD-10-CM | POA: Diagnosis not present

## 2020-10-12 ENCOUNTER — Ambulatory Visit: Payer: Medicare Other | Admitting: Podiatry

## 2020-10-12 ENCOUNTER — Other Ambulatory Visit: Payer: Self-pay

## 2020-10-12 DIAGNOSIS — M7671 Peroneal tendinitis, right leg: Secondary | ICD-10-CM

## 2020-10-12 DIAGNOSIS — R600 Localized edema: Secondary | ICD-10-CM | POA: Diagnosis not present

## 2020-10-12 DIAGNOSIS — T148XXA Other injury of unspecified body region, initial encounter: Secondary | ICD-10-CM | POA: Diagnosis not present

## 2020-10-12 MED ORDER — PREDNISONE 10 MG PO TABS: ORAL_TABLET | ORAL | 0 refills | Status: DC

## 2020-10-16 DIAGNOSIS — M1712 Unilateral primary osteoarthritis, left knee: Secondary | ICD-10-CM | POA: Diagnosis not present

## 2020-10-17 NOTE — Progress Notes (Signed)
Subjective:   Patient ID: Tiffany Brown, female   DOB: 75 y.o.   MRN: [id number]   HPI Patient states the pain is not improving a better what she tries to do if she is trying to wear the boot as best she can continues to have a lot of swelling and pain in the outside of her right foot   ROS      Objective:  Physical Exam  Neurovascular status intact negative Bevelyn Buckles' sign was noted with patient outside of the right foot found to be inflamed and painful with failure to respond to numerous conservative treatments including injections elevation compression and immobilization     Assessment:  Cannot rule out the possibility of a tear of the peroneal tendon right     Plan:  H&P reviewed MR data sent for MRI.  I do not want to put injection in currently until I get the results of the MRI.  I did go ahead today and I applied new to boot to try to reduce the swelling take some of the pressure off her foot and we will get the results and decide what might be appropriate

## 2020-10-23 ENCOUNTER — Other Ambulatory Visit: Payer: Medicare Other

## 2020-10-23 ENCOUNTER — Other Ambulatory Visit: Payer: Self-pay

## 2020-10-23 DIAGNOSIS — E782 Mixed hyperlipidemia: Secondary | ICD-10-CM | POA: Diagnosis not present

## 2020-10-23 DIAGNOSIS — I10 Essential (primary) hypertension: Secondary | ICD-10-CM

## 2020-10-23 DIAGNOSIS — E785 Hyperlipidemia, unspecified: Secondary | ICD-10-CM

## 2020-10-23 DIAGNOSIS — E1169 Type 2 diabetes mellitus with other specified complication: Secondary | ICD-10-CM | POA: Diagnosis not present

## 2020-10-23 LAB — CBC WITH DIFFERENTIAL/PLATELET
Basophils Absolute: 0 10*3/uL (ref 0.0–0.2)
Basos: 0 %
EOS (ABSOLUTE): 0 10*3/uL (ref 0.0–0.4)
Eos: 0 %
Hematocrit: 44.3 % (ref 34.0–46.6)
Hemoglobin: 14.6 g/dL (ref 11.1–15.9)
Immature Grans (Abs): 0.2 10*3/uL — ABNORMAL HIGH (ref 0.0–0.1)
Immature Granulocytes: 1 %
Lymphocytes Absolute: 3 10*3/uL (ref 0.7–3.1)
Lymphs: 19 %
MCH: 30 pg (ref 26.6–33.0)
MCHC: 33 g/dL (ref 31.5–35.7)
MCV: 91 fL (ref 79–97)
Monocytes Absolute: 0.7 10*3/uL (ref 0.1–0.9)
Monocytes: 4 %
Neutrophils Absolute: 11.9 10*3/uL — ABNORMAL HIGH (ref 1.4–7.0)
Neutrophils: 76 %
Platelets: 210 10*3/uL (ref 150–450)
RBC: 4.86 x10E6/uL (ref 3.77–5.28)
RDW: 12.7 % (ref 11.7–15.4)
WBC: 15.7 10*3/uL — ABNORMAL HIGH (ref 3.4–10.8)

## 2020-10-23 LAB — CMP14+EGFR
ALT: 18 IU/L (ref 0–32)
AST: 11 IU/L (ref 0–40)
Albumin/Globulin Ratio: 1.7 (ref 1.2–2.2)
Albumin: 4 g/dL (ref 3.7–4.7)
Alkaline Phosphatase: 156 IU/L — ABNORMAL HIGH (ref 44–121)
BUN/Creatinine Ratio: 27 (ref 12–28)
BUN: 19 mg/dL (ref 8–27)
Bilirubin Total: 0.4 mg/dL (ref 0.0–1.2)
CO2: 27 mmol/L (ref 20–29)
Calcium: 9.2 mg/dL (ref 8.7–10.3)
Chloride: 96 mmol/L (ref 96–106)
Creatinine, Ser: 0.71 mg/dL (ref 0.57–1.00)
Globulin, Total: 2.4 g/dL (ref 1.5–4.5)
Glucose: 232 mg/dL — ABNORMAL HIGH (ref 65–99)
Potassium: 4.1 mmol/L (ref 3.5–5.2)
Sodium: 140 mmol/L (ref 134–144)
Total Protein: 6.4 g/dL (ref 6.0–8.5)
eGFR: 89 mL/min/{1.73_m2} (ref >59)

## 2020-10-23 LAB — LIPID PANEL
Chol/HDL Ratio: 2.7 ratio (ref 0.0–4.4)
Cholesterol, Total: 155 mg/dL (ref 100–199)
HDL: 57 mg/dL (ref >39)
LDL Chol Calc (NIH): 70 mg/dL (ref 0–99)
Triglycerides: 164 mg/dL — ABNORMAL HIGH (ref 0–149)
VLDL Cholesterol Cal: 28 mg/dL (ref 5–40)

## 2020-10-23 LAB — BAYER DCA HB A1C WAIVED: HB A1C (BAYER DCA - WAIVED): 8.2 % — ABNORMAL HIGH (ref <7.0)

## 2020-10-25 ENCOUNTER — Other Ambulatory Visit: Payer: Self-pay

## 2020-10-25 ENCOUNTER — Ambulatory Visit (INDEPENDENT_AMBULATORY_CARE_PROVIDER_SITE_OTHER): Payer: Medicare Other | Admitting: Family Medicine

## 2020-10-25 ENCOUNTER — Encounter: Payer: Self-pay | Admitting: Family Medicine

## 2020-10-25 VITALS — BP 139/79 | HR 87 | Ht 66.0 in | Wt 263.0 lb

## 2020-10-25 DIAGNOSIS — E782 Mixed hyperlipidemia: Secondary | ICD-10-CM | POA: Diagnosis not present

## 2020-10-25 DIAGNOSIS — I10 Essential (primary) hypertension: Secondary | ICD-10-CM | POA: Diagnosis not present

## 2020-10-25 DIAGNOSIS — E1169 Type 2 diabetes mellitus with other specified complication: Secondary | ICD-10-CM | POA: Diagnosis not present

## 2020-10-25 DIAGNOSIS — E785 Hyperlipidemia, unspecified: Secondary | ICD-10-CM | POA: Diagnosis not present

## 2020-10-25 DIAGNOSIS — Z23 Encounter for immunization: Secondary | ICD-10-CM

## 2020-10-25 NOTE — Progress Notes (Signed)
BP 139/79   Pulse 87   Ht _0  (1.676 m)   Wt 263 lb (119.3 kg)   SpO2 95%   BMI 42.45 kg/m    Subjective:   Patient ID: Tiffany Brown, female    DOB: [birth year], 75 y.o.   MRN: [id number]  HPI: Tiffany Brown is a 75 y.o. female presenting on 10/25/2020 for Medical Management of Chronic Issues, Diabetes, and Hypertension   HPI Type 2 diabetes mellitus Patient comes in today for recheck of his diabetes. Patient has been currently taking no medications, a1c is up because of . Patient is currently on an ACE inhibitor/ARB. Patient has not seen an ophthalmologist this year. Patient denies any issues with their feet. The symptom started onset as an adult hypertension and hyperlipidemia ARE RELATED TO DM   Hypertension Patient is currently on amlodipine and furosemide and losartan, and their blood pressure today is 139/79. Patient denies any lightheadedness or dizziness. Patient denies headaches, blurred vision, chest pains, shortness of breath, or weakness. Denies any side effects from medication and is content with current medication.   Hyperlipidemia Patient is coming in for recheck of his hyperlipidemia. The patient is currently taking pravastatin. They deny any issues with myalgias or history of liver damage from it. They deny any focal numbness or weakness or chest pain.   Relevant past medical, surgical, family and social history reviewed and updated as indicated. Interim medical history since our last visit reviewed. Allergies and medications reviewed and updated.  Review of Systems  Constitutional:  Negative for chills and fever.  Eyes:  Negative for visual disturbance.  Respiratory:  Negative for chest tightness and shortness of breath.   Cardiovascular:  Negative for chest pain and leg swelling.  Musculoskeletal:  Negative for back pain and gait problem.  Skin:  Negative for rash.  Neurological:  Negative for light-headedness and headaches.  Psychiatric/Behavioral:   Negative for agitation and behavioral problems.   All other systems reviewed and are negative.  Per HPI unless specifically indicated above   Allergies as of 10/25/2020       Reactions   Other         Medication List        Accurate as of October 25, 2020 10:49 AM. If you have any questions, ask your nurse or doctor.          STOP taking these medications    HYDROcodone-acetaminophen 10-325 MG tablet Commonly known as: [place] Stopped by: Worthy Rancher, MD   oxyCODONE-acetaminophen 10-325 MG tablet Commonly known as: Percocet Stopped by: Worthy Rancher, MD   predniSONE 10 MG tablet Commonly known as: DELTASONE Stopped by: Fransisca Kaufmann Geanette Buonocore, MD       TAKE these medications    amLODipine 5 MG tablet Commonly known as: NORVASC Take 1 tablet (5 mg total) by mouth daily.   DULoxetine 60 MG capsule Commonly known as: CYMBALTA Take 2 capsules (120 mg total) by mouth daily.   fluticasone 50 MCG/ACT nasal spray Commonly known as: FLONASE Place 2 sprays into the nose daily as needed (allergies).   furosemide 40 MG tablet Commonly known as: LASIX Take 1 tablet (40 mg total) by mouth 2 (two) times daily as needed.   gabapentin 300 MG capsule Commonly known as: NEURONTIN Take 1 capsule (300 mg total) by mouth 3 (three) times daily.   losartan 100 MG tablet Commonly known as: COZAAR Take 1 tablet (100 mg total) by mouth daily.   meloxicam  7.5 MG tablet Commonly known as: MOBIC TAKE 1 TABLET BY MOUTH EVERY DAY   methocarbamol 750 MG tablet Commonly known as: ROBAXIN Take 750 mg by mouth 2 (two) times daily.   omeprazole 40 MG capsule Commonly known as: PRILOSEC Take 40 mg by mouth every morning.   pravastatin 80 MG tablet Commonly known as: PRAVACHOL Take 1 tablet (80 mg total) by mouth daily.   traMADol 100 MG 24 hr tablet Commonly known as: ULTRAM-ER Take 100 mg by mouth daily.         Objective:   There were no vitals taken for  this visit.  Wt Readings from Last 3 Encounters:  09/20/20 270 lb (122.5 kg)  07/24/20 270 lb (122.5 kg)  04/24/20 268 lb (121.6 kg)    Physical Exam Vitals and nursing note reviewed.  Constitutional:      General: She is not in acute distress.    Appearance: She is well-developed. She is not diaphoretic.  Eyes:     Conjunctiva/sclera: Conjunctivae normal.  Cardiovascular:     Rate and Rhythm: Normal rate and regular rhythm.     Heart sounds: Normal heart sounds. No murmur heard. Pulmonary:     Effort: Pulmonary effort is normal. No respiratory distress.     Breath sounds: Normal breath sounds. No wheezing.  Musculoskeletal:        General: No tenderness. Normal range of motion.  Skin:    General: Skin is warm and dry.     Findings: No rash.  Neurological:     Mental Status: She is alert and oriented to person, place, and time.     Coordination: Coordination normal.  Psychiatric:        Behavior: Behavior normal.    Results for orders placed or performed in visit on 10/23/20  CBC with Differential/Platelet  Result Value Ref Range   WBC 15.7 (H) 3.4 - 10.8 x10E3/uL   RBC 4.86 3.77 - 5.28 x10E6/uL   Hemoglobin 14.6 11.1 - 15.9 g/dL   Hematocrit 44.3 34.0 - 46.6 %   MCV 91 79 - 97 fL   MCH 30.0 26.6 - 33.0 pg   MCHC 33.0 31.5 - 35.7 g/dL   RDW 12.7 11.7 - 15.4 %   Platelets 210 150 - 450 x10E3/uL   Neutrophils 76 Not Estab. %   Lymphs 19 Not Estab. %   Monocytes 4 Not Estab. %   Eos 0 Not Estab. %   Basos 0 Not Estab. %   Neutrophils Absolute 11.9 (H) 1.4 - 7.0 x10E3/uL   Lymphocytes Absolute 3.0 0.7 - 3.1 x10E3/uL   Monocytes Absolute 0.7 0.1 - 0.9 x10E3/uL   EOS (ABSOLUTE) 0.0 0.0 - 0.4 x10E3/uL   Basophils Absolute 0.0 0.0 - 0.2 x10E3/uL   Immature Granulocytes 1 Not Estab. %   Immature Grans (Abs) 0.2 (H) 0.0 - 0.1 x10E3/uL  CMP14+EGFR  Result Value Ref Range   Glucose 232 (H) 65 - 99 mg/dL   BUN 19 8 - 27 mg/dL   Creatinine, Ser 0.71 0.57 - 1.00 mg/dL    eGFR 89 >59 mL/min/1.73   BUN/Creatinine Ratio 27 12 - 28   Sodium 140 134 - 144 mmol/L   Potassium 4.1 3.5 - 5.2 mmol/L   Chloride 96 96 - 106 mmol/L   CO2 27 20 - 29 mmol/L   Calcium 9.2 8.7 - 10.3 mg/dL   Total Protein 6.4 6.0 - 8.5 g/dL   Albumin 4.0 3.7 - 4.7 g/dL   Globulin,  Total 2.4 1.5 - 4.5 g/dL   Albumin/Globulin Ratio 1.7 1.2 - 2.2   Bilirubin Total 0.4 0.0 - 1.2 mg/dL   Alkaline Phosphatase 156 (H) 44 - 121 IU/L   AST 11 0 - 40 IU/L   ALT 18 0 - 32 IU/L  Lipid panel  Result Value Ref Range   Cholesterol, Total 155 100 - 199 mg/dL   Triglycerides 164 (H) 0 - 149 mg/dL   HDL 57 >39 mg/dL   VLDL Cholesterol Cal 28 5 - 40 mg/dL   LDL Chol Calc (NIH) 70 0 - 99 mg/dL   Chol/HDL Ratio 2.7 0.0 - 4.4 ratio  Bayer DCA Hb A1c Waived  Result Value Ref Range   HB A1C (BAYER DCA - WAIVED) 8.2 (H) <7.0 %    Assessment & Plan:   Problem List Items Addressed This Visit       Cardiovascular and Mediastinum   Essential hypertension, benign     Endocrine   DM type 2 with diabetic dyslipidemia ([place])     Other   Hyperlipidemia   Other Visit Diagnoses     Need for shingles vaccine    -  Primary   Relevant Orders   Varicella-zoster vaccine IM (Shingrix) (Completed)       Continue current medication, A1c is up but she has had a few rounds of steroids recently and just finished another course and that is likely why they are up and also why the white count is up.  Recommend diet and exercise and she is off steroids now so we will see how the blood sugars do over the next few months. Follow up plan: Return in about 3 months (around 01/25/2021), or if symptoms worsen or fail to improve, for Diabetes and hypertension and cholesterol.  Counseling provided for all of the vaccine components Orders Placed This Encounter  Procedures   Varicella-zoster vaccine IM (Shingrix)    Caryl Pina, MD [facility] 10/25/2020, 10:49 AM

## 2020-11-02 ENCOUNTER — Ambulatory Visit
Admission: RE | Admit: 2020-11-02 | Discharge: 2020-11-02 | Disposition: A | Discharge status: Home / Self Care | Payer: Medicare Other | Source: Ambulatory Visit | Attending: Podiatry | Admitting: Podiatry

## 2020-11-02 ENCOUNTER — Other Ambulatory Visit: Payer: Self-pay

## 2020-11-02 DIAGNOSIS — M1712 Unilateral primary osteoarthritis, left knee: Secondary | ICD-10-CM | POA: Diagnosis not present

## 2020-11-02 DIAGNOSIS — T148XXA Other injury of unspecified body region, initial encounter: Secondary | ICD-10-CM

## 2020-11-02 DIAGNOSIS — S86111A Strain of other muscle(s) and tendon(s) of posterior muscle group at lower leg level, right leg, initial encounter: Secondary | ICD-10-CM | POA: Diagnosis not present

## 2020-11-02 DIAGNOSIS — M47812 Spondylosis without myelopathy or radiculopathy, cervical region: Secondary | ICD-10-CM | POA: Diagnosis not present

## 2020-11-02 DIAGNOSIS — M47817 Spondylosis without myelopathy or radiculopathy, lumbosacral region: Secondary | ICD-10-CM | POA: Diagnosis not present

## 2020-11-02 DIAGNOSIS — S86311A Strain of muscle(s) and tendon(s) of peroneal muscle group at lower leg level, right leg, initial encounter: Secondary | ICD-10-CM | POA: Diagnosis not present

## 2020-11-02 DIAGNOSIS — G894 Chronic pain syndrome: Secondary | ICD-10-CM | POA: Diagnosis not present

## 2020-11-03 NOTE — Progress Notes (Signed)
Schedule her for appointment

## 2020-11-09 ENCOUNTER — Telehealth: Payer: Self-pay | Admitting: Podiatry

## 2020-11-09 NOTE — Telephone Encounter (Signed)
Patient calling to request a call back to discuss MRI results. Please advise if patient needs to be scheduled to discuss results in person.

## 2020-11-15 ENCOUNTER — Other Ambulatory Visit: Payer: Self-pay

## 2020-11-15 ENCOUNTER — Ambulatory Visit: Payer: Medicare Other | Admitting: Podiatry

## 2020-11-15 DIAGNOSIS — S86311A Strain of muscle(s) and tendon(s) of peroneal muscle group at lower leg level, right leg, initial encounter: Secondary | ICD-10-CM

## 2020-11-15 DIAGNOSIS — Z01818 Encounter for other preprocedural examination: Secondary | ICD-10-CM | POA: Diagnosis not present

## 2020-11-21 ENCOUNTER — Encounter: Payer: Self-pay | Admitting: Podiatry

## 2020-11-21 NOTE — Progress Notes (Signed)
Subjective:  Patient ID: Tiffany Brown, female    DOB: [birth year],  MRN: [id number]  Chief Complaint  Patient presents with   Foot Pain    MRI results     75 y.o. female presents with the above complaint.  Patient presents with primary complaint of right peroneal tendon tear.  Patient states that has been painful to the side of the foot has progressively gotten worse.  Patient was conservatively treated by Dr. Paulla Dolly and has failed all conservative treatment options patient was referred to see me for peroneal tendon repair right foot.  She is a diabetic with last A1c of 8.2.  She had an MRI done she would like to review the MRI.   Review of Systems: Negative except as noted in the HPI. Denies N/V/F/Ch.  Past Medical History:  Diagnosis Date   Abnormal glucose    Depression with anxiety    Essential hypertension, benign    Fibromyalgia    Melanoma ([place])    Near syncope    Other and unspecified hyperlipidemia     Current Outpatient Medications:    amLODipine (NORVASC) 5 MG tablet, Take 1 tablet (5 mg total) by mouth daily., Disp: 90 tablet, Rfl: 3   DULoxetine (CYMBALTA) 60 MG capsule, Take 2 capsules (120 mg total) by mouth daily., Disp: 180 capsule, Rfl: 3   fluticasone (FLONASE) 50 MCG/ACT nasal spray, Place 2 sprays into the nose daily as needed (allergies). , Disp: , Rfl:    furosemide (LASIX) 40 MG tablet, Take 1 tablet (40 mg total) by mouth 2 (two) times daily as needed., Disp: 180 tablet, Rfl: 3   gabapentin (NEURONTIN) 300 MG capsule, Take 1 capsule (300 mg total) by mouth 3 (three) times daily., Disp: 90 capsule, Rfl: 3   losartan (COZAAR) 100 MG tablet, Take 1 tablet (100 mg total) by mouth daily., Disp: 90 tablet, Rfl: 3   meloxicam (MOBIC) 7.5 MG tablet, TAKE 1 TABLET BY MOUTH EVERY DAY, Disp: 90 tablet, Rfl: 3   methocarbamol (ROBAXIN) 750 MG tablet, Take 750 mg by mouth 2 (two) times daily., Disp: , Rfl:    omeprazole (PRILOSEC) 40 MG capsule, Take 40 mg by mouth  every morning., Disp: , Rfl:    pravastatin (PRAVACHOL) 80 MG tablet, Take 1 tablet (80 mg total) by mouth daily., Disp: 90 tablet, Rfl: 3   traMADol (ULTRAM-ER) 100 MG 24 hr tablet, Take 100 mg by mouth daily., Disp: , Rfl:   Social History   Tobacco Use  Smoking Status Former   Packs/day: 0.80   Years: 25.00   Pack years: 20.00   Types: Cigarettes   Quit date: 05/06/1994   Years since quitting: 26.5  Smokeless Tobacco Never    Allergies  Allergen Reactions   Other    Objective:  There were no vitals filed for this visit. There is no height or weight on file to calculate BMI. Constitutional Well developed. Well nourished.  Vascular Dorsalis pedis pulses palpable bilaterally. Posterior tibial pulses palpable bilaterally. Capillary refill normal to all digits.  No cyanosis or clubbing noted. Pedal hair growth normal.  Neurologic Normal speech. Oriented to person, place, and time. Epicritic sensation to light touch grossly present bilaterally.  Dermatologic Nails well groomed and normal in appearance. No open wounds. No skin lesions.  Orthopedic: Pain on palpation along the course of the peroneal tendon.  Adequate strength noted with eversion of the foot resisted.  No pain with plantarflexion inversion of the foot.  Decreased with range  of motion of the first tarsometatarsal joint.  No pain at the Achilles tendon, ATFL ligament, posterior tibial tendon.  No pain with range of motion of the deep intra-articular ankle joint.   Radiographs: IMPRESSION: 1. Complete tear of the distal peroneal longus tendon inferior to the calcaneus with 3.8 cm retraction. 2. Small partial tear of the distal posterior tibial tendon. 3. Midfoot degenerative and stress related changes.  No fracture. 4. Tibiotalar osteoarthritis with full-thickness cartilage loss. Assessment:   1. Peroneal tendon tear, right, initial encounter   2. Preoperative examination    Plan:  Patient was evaluated and  treated and all questions answered.  Right peroneal tendon tear acute -I explained to the patient the etiology of tear and various treatment options were extensively discussed.  Given the amount of pain that she is having I believe patient will benefit from primary repair of the tear.  I discussed with her given that she is a diabetic she is a high risk of wound complication especially given her last A1c is 8.2%.  I discussed with her that the risk of not repairing the tendon can lead to deformity of the foot and in the setting of diabetes lead to ulceration and possible amputation.  I discussed with her that even with surgery given that she is diabetic she has a high risk of wound complication as well as risk of losing the limb.  Patient states understand would like to proceed despite the risks.  I discussed my preoperative intraoperative and postoperative plan in extensive detail with the patient.  I plan on doing a primary repair of the peroneal tendon versus tenodesis into the peroneal brevis the proximal and distal segment if too much deficit.  I discussed my surgical plan extensively she states understand like to proceed with surgery. -She will need to be nonweightbearing to the right lower extremity.  She states understanding will do so. -Informed surgical risk consent was reviewed and read aloud to the patient.  I reviewed the films.  I have discussed my findings with the patient in great detail.  I have discussed all risks including but not limited to infection, stiffness, scarring, limp, disability, deformity, damage to blood vessels and nerves, numbness, poor healing, need for braces, arthritis, chronic pain, amputation, death.  All benefits and realistic expectations discussed in great detail.  I have made no promises as to the outcome.  I have provided realistic expectations.  I have offered the patient a 2nd opinion, which they have declined and assured me they preferred to proceed despite the  risks   No follow-ups on file.

## 2020-11-24 ENCOUNTER — Other Ambulatory Visit: Payer: Self-pay | Admitting: Podiatry

## 2020-11-27 DIAGNOSIS — G894 Chronic pain syndrome: Secondary | ICD-10-CM | POA: Diagnosis not present

## 2020-11-27 DIAGNOSIS — M1712 Unilateral primary osteoarthritis, left knee: Secondary | ICD-10-CM | POA: Diagnosis not present

## 2020-11-27 DIAGNOSIS — M47817 Spondylosis without myelopathy or radiculopathy, lumbosacral region: Secondary | ICD-10-CM | POA: Diagnosis not present

## 2020-11-27 DIAGNOSIS — M5137 Other intervertebral disc degeneration, lumbosacral region: Secondary | ICD-10-CM | POA: Diagnosis not present

## 2020-11-28 ENCOUNTER — Telehealth: Payer: Self-pay | Admitting: Family Medicine

## 2020-11-28 NOTE — Telephone Encounter (Signed)
Patient aware and verbalized understanding. °

## 2020-11-28 NOTE — Telephone Encounter (Signed)
After 12/25/20.

## 2020-11-29 ENCOUNTER — Telehealth: Payer: Self-pay | Admitting: Urology

## 2020-11-29 NOTE — Telephone Encounter (Signed)
DOS - 12/18/20  REPAIR OF PERONEAL TENDON RIGHT --- [id number]   [employer] EFFECTIVE DATE - 05/06/20  PLAN DEDUCTIBLE - $0.00 OUT OF POCKET - $3,600.00 W/ $2,412.10 REMAINING COINSURANCE - 0% COPAY - $295     PER [employer] WEB SITE FOR CPT CODE 60454 Notification or Prior Authorization is not required for the requested services  Decision ID [id number]

## 2020-12-04 NOTE — Telephone Encounter (Signed)
Please advise 

## 2020-12-18 ENCOUNTER — Other Ambulatory Visit: Payer: Self-pay | Admitting: Podiatry

## 2020-12-18 DIAGNOSIS — S86391A Other injury of muscle(s) and tendon(s) of peroneal muscle group at lower leg level, right leg, initial encounter: Secondary | ICD-10-CM | POA: Diagnosis not present

## 2020-12-18 DIAGNOSIS — T148XXA Other injury of unspecified body region, initial encounter: Secondary | ICD-10-CM | POA: Diagnosis not present

## 2020-12-18 MED ORDER — HYDROCODONE-ACETAMINOPHEN 5-325 MG PO TABS: 1.0000 | ORAL_TABLET | Freq: Four times a day (QID) | ORAL | 0 refills | Status: DC | PRN

## 2020-12-18 MED ORDER — IBUPROFEN 800 MG PO TABS: 800.0000 mg | ORAL_TABLET | Freq: Four times a day (QID) | ORAL | 1 refills | Status: DC | PRN

## 2020-12-18 MED ORDER — OXYCODONE-ACETAMINOPHEN 5-325 MG PO TABS: 1.0000 | ORAL_TABLET | ORAL | 0 refills | Status: DC | PRN

## 2020-12-27 ENCOUNTER — Ambulatory Visit: Payer: Medicare Other | Admitting: Podiatry

## 2020-12-27 ENCOUNTER — Other Ambulatory Visit: Payer: Self-pay

## 2020-12-27 DIAGNOSIS — Z9889 Other specified postprocedural states: Secondary | ICD-10-CM

## 2020-12-27 DIAGNOSIS — S86311A Strain of muscle(s) and tendon(s) of peroneal muscle group at lower leg level, right leg, initial encounter: Secondary | ICD-10-CM

## 2020-12-28 DIAGNOSIS — G894 Chronic pain syndrome: Secondary | ICD-10-CM | POA: Diagnosis not present

## 2020-12-28 DIAGNOSIS — M5137 Other intervertebral disc degeneration, lumbosacral region: Secondary | ICD-10-CM | POA: Diagnosis not present

## 2020-12-28 DIAGNOSIS — M1712 Unilateral primary osteoarthritis, left knee: Secondary | ICD-10-CM | POA: Diagnosis not present

## 2020-12-28 DIAGNOSIS — M79673 Pain in unspecified foot: Secondary | ICD-10-CM | POA: Diagnosis not present

## 2020-12-28 NOTE — Progress Notes (Signed)
Subjective:  Patient ID: Tiffany Brown, female    DOB: 09/22/1945,  MRN: [id number]  Chief Complaint  Patient presents with   Routine Post Benard Rink 8.15.22    DOS: 12/18/2020 Procedure: Right peroneal tendon repair with tenodesis  75 y.o. female returns for post-op check.  Patient states that she has been nonweightbearing to the right lower extremity.  She states that the cam boot has been causing her some discomfort she denies any other acute complaints the bandages clean dry and intact.  Review of Systems: Negative except as noted in the HPI. Denies N/V/F/Ch.  Past Medical History:  Diagnosis Date   Abnormal glucose    Depression with anxiety    Essential hypertension, benign    Fibromyalgia    Melanoma ([place])    Near syncope    Other and unspecified hyperlipidemia     Current Outpatient Medications:    amLODipine (NORVASC) 5 MG tablet, Take 1 tablet (5 mg total) by mouth daily., Disp: 90 tablet, Rfl: 3   DULoxetine (CYMBALTA) 60 MG capsule, Take 2 capsules (120 mg total) by mouth daily., Disp: 180 capsule, Rfl: 3   fluticasone (FLONASE) 50 MCG/ACT nasal spray, Place 2 sprays into the nose daily as needed (allergies). , Disp: , Rfl:    furosemide (LASIX) 40 MG tablet, Take 1 tablet (40 mg total) by mouth 2 (two) times daily as needed., Disp: 180 tablet, Rfl: 3   gabapentin (NEURONTIN) 300 MG capsule, TAKE 1 CAPSULE BY MOUTH THREE TIMES A DAY, Disp: 90 capsule, Rfl: 3   HYDROcodone-acetaminophen (NORCO) 5-325 MG tablet, Take 1 tablet by mouth every 6 (six) hours as needed for moderate pain., Disp: 30 tablet, Rfl: 0   ibuprofen (ADVIL) 800 MG tablet, Take 1 tablet (800 mg total) by mouth every 6 (six) hours as needed., Disp: 60 tablet, Rfl: 1   losartan (COZAAR) 100 MG tablet, Take 1 tablet (100 mg total) by mouth daily., Disp: 90 tablet, Rfl: 3   meloxicam (MOBIC) 7.5 MG tablet, TAKE 1 TABLET BY MOUTH EVERY DAY, Disp: 90 tablet, Rfl: 3   methocarbamol (ROBAXIN) 750 MG tablet,  Take 750 mg by mouth 2 (two) times daily., Disp: , Rfl:    omeprazole (PRILOSEC) 40 MG capsule, Take 40 mg by mouth every morning., Disp: , Rfl:    oxyCODONE-acetaminophen (PERCOCET) 5-325 MG tablet, Take 1 tablet by mouth every 4 (four) hours as needed for severe pain., Disp: 30 tablet, Rfl: 0   pravastatin (PRAVACHOL) 80 MG tablet, Take 1 tablet (80 mg total) by mouth daily., Disp: 90 tablet, Rfl: 3   traMADol (ULTRAM-ER) 100 MG 24 hr tablet, Take 100 mg by mouth daily., Disp: , Rfl:   Social History   Tobacco Use  Smoking Status Former   Packs/day: 0.80   Years: 25.00   Pack years: 20.00   Types: Cigarettes   Quit date: 05/06/1994   Years since quitting: 26.6  Smokeless Tobacco Never    Allergies  Allergen Reactions   Other    Objective:  There were no vitals filed for this visit. There is no height or weight on file to calculate BMI. Constitutional Well developed. Well nourished.  Vascular Foot warm and well perfused. Capillary refill normal to all digits.   Neurologic Normal speech. Oriented to person, place, and time. Epicritic sensation to light touch grossly present bilaterally.  Dermatologic Skin healing well without signs of infection. Skin edges well coapted without signs of infection.  Orthopedic: Tenderness to  palpation noted about the surgical site.   Radiographs: None Assessment:   1. Peroneal tendon tear, right, initial encounter   2. Status post foot surgery    Plan:  Patient was evaluated and treated and all questions answered.  S/p foot surgery right -Progressing as expected post-operatively. -XR: None -WB Status: Nonweightbearing to the right lower extremity in cam boot -Sutures: Intact.  No clinical signs of dehiscence noted.  No complication noted. -Medications: None -Foot redressed.  No follow-ups on file.

## 2021-01-10 ENCOUNTER — Other Ambulatory Visit: Payer: Self-pay

## 2021-01-10 ENCOUNTER — Ambulatory Visit (INDEPENDENT_AMBULATORY_CARE_PROVIDER_SITE_OTHER): Payer: Medicare Other | Admitting: Podiatry

## 2021-01-10 DIAGNOSIS — Z9889 Other specified postprocedural states: Secondary | ICD-10-CM

## 2021-01-10 DIAGNOSIS — S86311A Strain of muscle(s) and tendon(s) of peroneal muscle group at lower leg level, right leg, initial encounter: Secondary | ICD-10-CM

## 2021-01-10 DIAGNOSIS — M9903 Segmental and somatic dysfunction of lumbar region: Secondary | ICD-10-CM | POA: Diagnosis not present

## 2021-01-10 DIAGNOSIS — M9905 Segmental and somatic dysfunction of pelvic region: Secondary | ICD-10-CM | POA: Diagnosis not present

## 2021-01-10 DIAGNOSIS — M9904 Segmental and somatic dysfunction of sacral region: Secondary | ICD-10-CM | POA: Diagnosis not present

## 2021-01-10 DIAGNOSIS — M5137 Other intervertebral disc degeneration, lumbosacral region: Secondary | ICD-10-CM | POA: Diagnosis not present

## 2021-01-11 DIAGNOSIS — M5137 Other intervertebral disc degeneration, lumbosacral region: Secondary | ICD-10-CM | POA: Diagnosis not present

## 2021-01-11 DIAGNOSIS — M9904 Segmental and somatic dysfunction of sacral region: Secondary | ICD-10-CM | POA: Diagnosis not present

## 2021-01-11 DIAGNOSIS — M9903 Segmental and somatic dysfunction of lumbar region: Secondary | ICD-10-CM | POA: Diagnosis not present

## 2021-01-11 DIAGNOSIS — M9905 Segmental and somatic dysfunction of pelvic region: Secondary | ICD-10-CM | POA: Diagnosis not present

## 2021-01-15 DIAGNOSIS — M9903 Segmental and somatic dysfunction of lumbar region: Secondary | ICD-10-CM | POA: Diagnosis not present

## 2021-01-15 DIAGNOSIS — M9904 Segmental and somatic dysfunction of sacral region: Secondary | ICD-10-CM | POA: Diagnosis not present

## 2021-01-15 DIAGNOSIS — M5137 Other intervertebral disc degeneration, lumbosacral region: Secondary | ICD-10-CM | POA: Diagnosis not present

## 2021-01-15 DIAGNOSIS — M9905 Segmental and somatic dysfunction of pelvic region: Secondary | ICD-10-CM | POA: Diagnosis not present

## 2021-01-15 NOTE — Progress Notes (Signed)
Subjective:  Patient ID: Tiffany Brown, female    DOB: 1945/07/17,  MRN: [id number]  Chief Complaint  Patient presents with   Routine Post Op    POV DOS 8.15.22    DOS: 12/18/2020 Procedure: Right peroneal tendon repair with tenodesis  75 y.o. female returns for post-op check.  Patient states that she has been nonweightbearing to the right lower extremity.  She states that the cam boot has been causing her some discomfort she denies any other acute complaints the bandages clean dry and intact.  Review of Systems: Negative except as noted in the HPI. Denies N/V/F/Ch.  Past Medical History:  Diagnosis Date   Abnormal glucose    Depression with anxiety    Essential hypertension, benign    Fibromyalgia    Melanoma ([place])    Near syncope    Other and unspecified hyperlipidemia     Current Outpatient Medications:    amLODipine (NORVASC) 5 MG tablet, Take 1 tablet (5 mg total) by mouth daily., Disp: 90 tablet, Rfl: 3   DULoxetine (CYMBALTA) 60 MG capsule, Take 2 capsules (120 mg total) by mouth daily., Disp: 180 capsule, Rfl: 3   fluticasone (FLONASE) 50 MCG/ACT nasal spray, Place 2 sprays into the nose daily as needed (allergies). , Disp: , Rfl:    furosemide (LASIX) 40 MG tablet, Take 1 tablet (40 mg total) by mouth 2 (two) times daily as needed., Disp: 180 tablet, Rfl: 3   gabapentin (NEURONTIN) 300 MG capsule, TAKE 1 CAPSULE BY MOUTH THREE TIMES A DAY, Disp: 90 capsule, Rfl: 3   HYDROcodone-acetaminophen (NORCO) 5-325 MG tablet, Take 1 tablet by mouth every 6 (six) hours as needed for moderate pain., Disp: 30 tablet, Rfl: 0   ibuprofen (ADVIL) 800 MG tablet, Take 1 tablet (800 mg total) by mouth every 6 (six) hours as needed., Disp: 60 tablet, Rfl: 1   losartan (COZAAR) 100 MG tablet, Take 1 tablet (100 mg total) by mouth daily., Disp: 90 tablet, Rfl: 3   meloxicam (MOBIC) 7.5 MG tablet, TAKE 1 TABLET BY MOUTH EVERY DAY, Disp: 90 tablet, Rfl: 3   methocarbamol (ROBAXIN) 750 MG  tablet, Take 750 mg by mouth 2 (two) times daily., Disp: , Rfl:    omeprazole (PRILOSEC) 40 MG capsule, Take 40 mg by mouth every morning., Disp: , Rfl:    oxyCODONE-acetaminophen (PERCOCET) 5-325 MG tablet, Take 1 tablet by mouth every 4 (four) hours as needed for severe pain., Disp: 30 tablet, Rfl: 0   pravastatin (PRAVACHOL) 80 MG tablet, Take 1 tablet (80 mg total) by mouth daily., Disp: 90 tablet, Rfl: 3   traMADol (ULTRAM-ER) 100 MG 24 hr tablet, Take 100 mg by mouth daily., Disp: , Rfl:   Social History   Tobacco Use  Smoking Status Former   Packs/day: 0.80   Years: 25.00   Pack years: 20.00   Types: Cigarettes   Quit date: 05/06/1994   Years since quitting: 26.7  Smokeless Tobacco Never    Allergies  Allergen Reactions   Other    Objective:  There were no vitals filed for this visit. There is no height or weight on file to calculate BMI. Constitutional Well developed. Well nourished.  Vascular Foot warm and well perfused. Capillary refill normal to all digits.   Neurologic Normal speech. Oriented to person, place, and time. Epicritic sensation to light touch grossly present bilaterally.  Dermatologic Deep skin still well attached and adhered.  Superficial dehiscence noted.  Does not probe down to deep  tissue.  Orthopedic: Tenderness to palpation noted about the surgical site.   Radiographs: None Assessment:   1. Peroneal tendon tear, right, initial encounter   2. Status post foot surgery     Plan:  Patient was evaluated and treated and all questions answered.  S/p foot surgery right -Progressing as expected post-operatively. -XR: None -WB Status: Nonweightbearing to the right lower extremity in cam boot -Sutures: Removed superficial dehiscence noted.  I encouraged her to start doing Betadine wet-to-dry dressing until resolve meant.  Continue keeping the foot dry. -Medications: None -Foot redressed.  No follow-ups on file.

## 2021-01-17 DIAGNOSIS — M9903 Segmental and somatic dysfunction of lumbar region: Secondary | ICD-10-CM | POA: Diagnosis not present

## 2021-01-17 DIAGNOSIS — M5137 Other intervertebral disc degeneration, lumbosacral region: Secondary | ICD-10-CM | POA: Diagnosis not present

## 2021-01-17 DIAGNOSIS — M9904 Segmental and somatic dysfunction of sacral region: Secondary | ICD-10-CM | POA: Diagnosis not present

## 2021-01-17 DIAGNOSIS — M9905 Segmental and somatic dysfunction of pelvic region: Secondary | ICD-10-CM | POA: Diagnosis not present

## 2021-01-23 ENCOUNTER — Other Ambulatory Visit: Payer: Self-pay | Admitting: Family Medicine

## 2021-01-23 ENCOUNTER — Other Ambulatory Visit: Payer: Self-pay | Admitting: Orthopaedic Surgery

## 2021-01-23 DIAGNOSIS — M9903 Segmental and somatic dysfunction of lumbar region: Secondary | ICD-10-CM | POA: Diagnosis not present

## 2021-01-23 DIAGNOSIS — M25562 Pain in left knee: Secondary | ICD-10-CM

## 2021-01-23 DIAGNOSIS — M9904 Segmental and somatic dysfunction of sacral region: Secondary | ICD-10-CM | POA: Diagnosis not present

## 2021-01-23 DIAGNOSIS — M5137 Other intervertebral disc degeneration, lumbosacral region: Secondary | ICD-10-CM | POA: Diagnosis not present

## 2021-01-23 DIAGNOSIS — M9905 Segmental and somatic dysfunction of pelvic region: Secondary | ICD-10-CM | POA: Diagnosis not present

## 2021-01-24 ENCOUNTER — Other Ambulatory Visit: Payer: Self-pay

## 2021-01-24 ENCOUNTER — Ambulatory Visit (INDEPENDENT_AMBULATORY_CARE_PROVIDER_SITE_OTHER): Payer: Medicare Other | Admitting: Podiatry

## 2021-01-24 DIAGNOSIS — Z9889 Other specified postprocedural states: Secondary | ICD-10-CM

## 2021-01-24 DIAGNOSIS — S86311A Strain of muscle(s) and tendon(s) of peroneal muscle group at lower leg level, right leg, initial encounter: Secondary | ICD-10-CM

## 2021-01-24 NOTE — Progress Notes (Signed)
Subjective:  Patient ID: Tiffany Brown, female    DOB: 11-May-1945,  MRN: [id number]  Chief Complaint  Patient presents with   Routine Post Op    DOS 8.15.22    DOS: 12/18/2020 Procedure: Right peroneal tendon repair with tenodesis  75 y.o. female returns for post-op check.  Patient has been weightbearing as tolerated in boot with the walker.  She states that she is doing a lot better.  The Betadine helped close the skin of much better.  Review of Systems: Negative except as noted in the HPI. Denies N/V/F/Ch.  Past Medical History:  Diagnosis Date   Abnormal glucose    Depression with anxiety    Essential hypertension, benign    Fibromyalgia    Melanoma ([place])    Near syncope    Other and unspecified hyperlipidemia     Current Outpatient Medications:    amLODipine (NORVASC) 5 MG tablet, Take 1 tablet (5 mg total) by mouth daily., Disp: 90 tablet, Rfl: 3   DULoxetine (CYMBALTA) 60 MG capsule, Take 2 capsules (120 mg total) by mouth daily., Disp: 180 capsule, Rfl: 3   fluticasone (FLONASE) 50 MCG/ACT nasal spray, Place 2 sprays into the nose daily as needed (allergies). , Disp: , Rfl:    furosemide (LASIX) 40 MG tablet, Take 1 tablet (40 mg total) by mouth 2 (two) times daily as needed., Disp: 180 tablet, Rfl: 3   gabapentin (NEURONTIN) 300 MG capsule, TAKE 1 CAPSULE BY MOUTH THREE TIMES A DAY, Disp: 90 capsule, Rfl: 3   HYDROcodone-acetaminophen (NORCO) 5-325 MG tablet, Take 1 tablet by mouth every 6 (six) hours as needed for moderate pain., Disp: 30 tablet, Rfl: 0   ibuprofen (ADVIL) 800 MG tablet, Take 1 tablet (800 mg total) by mouth every 6 (six) hours as needed., Disp: 60 tablet, Rfl: 1   losartan (COZAAR) 100 MG tablet, Take 1 tablet (100 mg total) by mouth daily., Disp: 90 tablet, Rfl: 3   meloxicam (MOBIC) 7.5 MG tablet, TAKE 1 TABLET BY MOUTH EVERY DAY, Disp: 90 tablet, Rfl: 3   methocarbamol (ROBAXIN) 750 MG tablet, Take 750 mg by mouth 2 (two) times daily., Disp: ,  Rfl:    omeprazole (PRILOSEC) 40 MG capsule, Take 40 mg by mouth every morning., Disp: , Rfl:    oxyCODONE-acetaminophen (PERCOCET) 5-325 MG tablet, Take 1 tablet by mouth every 4 (four) hours as needed for severe pain., Disp: 30 tablet, Rfl: 0   pravastatin (PRAVACHOL) 80 MG tablet, Take 1 tablet (80 mg total) by mouth daily., Disp: 90 tablet, Rfl: 3   traMADol (ULTRAM-ER) 100 MG 24 hr tablet, Take 100 mg by mouth daily., Disp: , Rfl:   Social History   Tobacco Use  Smoking Status Former   Packs/day: 0.80   Years: 25.00   Pack years: 20.00   Types: Cigarettes   Quit date: 05/06/1994   Years since quitting: 26.7  Smokeless Tobacco Never    Allergies  Allergen Reactions   Other    Objective:  There were no vitals filed for this visit. There is no height or weight on file to calculate BMI. Constitutional Well developed. Well nourished.  Vascular Foot warm and well perfused. Capillary refill normal to all digits.   Neurologic Normal speech. Oriented to person, place, and time. Epicritic sensation to light touch grossly present bilaterally.  Dermatologic Skin mostly completely epithelialized.  Small area of superficial dehiscence noted not deep.  I continue to apply triple antibiotic and a Band-Aid  Orthopedic:  Tenderness to palpation noted about the surgical site.   Radiographs: None Assessment:   1. Peroneal tendon tear, right, initial encounter   2. Status post foot surgery      Plan:  Patient was evaluated and treated and all questions answered.  S/p foot surgery right -Progressing as expected post-operatively. -XR: None -WB Status: Nonweightbearing to the right lower extremity in cam boot -Sutures: Removed superficial dehiscence noted.  Start applying triple antibiotic and a Band-Aid. -Medications: None -Prescription for physical therapy was given.  She can be weightbearing as tolerated in regular shoes once the physical therapy has transitioned  No follow-ups on  file.

## 2021-01-25 ENCOUNTER — Encounter: Payer: Self-pay | Admitting: Family Medicine

## 2021-01-25 ENCOUNTER — Ambulatory Visit (INDEPENDENT_AMBULATORY_CARE_PROVIDER_SITE_OTHER): Payer: Medicare Other | Admitting: Family Medicine

## 2021-01-25 VITALS — BP 152/73 | HR 82 | Ht 66.0 in | Wt 270.0 lb

## 2021-01-25 DIAGNOSIS — I10 Essential (primary) hypertension: Secondary | ICD-10-CM

## 2021-01-25 DIAGNOSIS — E785 Hyperlipidemia, unspecified: Secondary | ICD-10-CM

## 2021-01-25 DIAGNOSIS — E1169 Type 2 diabetes mellitus with other specified complication: Secondary | ICD-10-CM

## 2021-01-25 DIAGNOSIS — K219 Gastro-esophageal reflux disease without esophagitis: Secondary | ICD-10-CM

## 2021-01-25 DIAGNOSIS — E782 Mixed hyperlipidemia: Secondary | ICD-10-CM | POA: Diagnosis not present

## 2021-01-25 DIAGNOSIS — M5137 Other intervertebral disc degeneration, lumbosacral region: Secondary | ICD-10-CM | POA: Diagnosis not present

## 2021-01-25 DIAGNOSIS — M9905 Segmental and somatic dysfunction of pelvic region: Secondary | ICD-10-CM | POA: Diagnosis not present

## 2021-01-25 DIAGNOSIS — D72829 Elevated white blood cell count, unspecified: Secondary | ICD-10-CM | POA: Diagnosis not present

## 2021-01-25 DIAGNOSIS — M9904 Segmental and somatic dysfunction of sacral region: Secondary | ICD-10-CM | POA: Diagnosis not present

## 2021-01-25 DIAGNOSIS — Z23 Encounter for immunization: Secondary | ICD-10-CM

## 2021-01-25 DIAGNOSIS — M9903 Segmental and somatic dysfunction of lumbar region: Secondary | ICD-10-CM | POA: Diagnosis not present

## 2021-01-25 LAB — BAYER DCA HB A1C WAIVED: HB A1C (BAYER DCA - WAIVED): 7.1 % — ABNORMAL HIGH (ref 4.8–5.6)

## 2021-01-25 NOTE — Progress Notes (Signed)
BP (!) 152/73   Pulse 82   Ht 5\' 6"  (1.676 m)   Wt 270 lb (122.5 kg)   SpO2 99%   BMI 43.58 kg/m    Subjective:   Patient ID: Tiffany Brown, female    DOB: 05/28/1945, 75 y.o.   MRN: [id number]  HPI: Tiffany Brown is a 75 y.o. female presenting on 01/25/2021 for Medical Management of Chronic Issues, Diabetes, Hyperlipidemia, and Hypertension   HPI Type 2 diabetes mellitus Patient comes in today for recheck of his diabetes. Patient has been currently taking no medication currently has been diet controlled, A1c 7.1. Patient is currently on an ACE inhibitor/ARB. Patient has seen an ophthalmologist this year. Patient denies any new issues with their feet. The symptom started onset as an adult hypertension and hyperlipidemia and dyslipidemia ARE RELATED TO DM   Hypertension Patient is currently on amlodipine and furosemide and losartan, and their blood pressure today is 152/73. Patient denies any lightheadedness or dizziness. Patient denies headaches, blurred vision, chest pains, shortness of breath, or weakness. Denies any side effects from medication and is content with current medication.   Hyperlipidemia Patient is coming in for recheck of his hyperlipidemia. The patient is currently taking pravastatin. They deny any issues with myalgias or history of liver damage from it. They deny any focal numbness or weakness or chest pain.   Patient is currently working with orthopedic on some ligament tears and issues.  She has a boot on her right leg and is continue to work with them  Relevant past medical, surgical, family and social history reviewed and updated as indicated. Interim medical history since our last visit reviewed. Allergies and medications reviewed and updated.  Review of Systems  Constitutional:  Negative for chills and fever.  HENT:  Negative for congestion, ear discharge and ear pain.   Eyes:  Negative for redness and visual disturbance.  Respiratory:  Negative for  chest tightness and shortness of breath.   Cardiovascular:  Negative for chest pain and leg swelling.  Genitourinary:  Negative for difficulty urinating and dysuria.  Musculoskeletal:  Negative for back pain and gait problem.  Skin:  Negative for rash.  Neurological:  Negative for light-headedness and headaches.  Psychiatric/Behavioral:  Negative for agitation and behavioral problems.   All other systems reviewed and are negative.  Per HPI unless specifically indicated above   Allergies as of 01/25/2021       Reactions   Other         Medication List        Accurate as of January 25, 2021  1:46 PM. If you have any questions, ask your nurse or doctor.          amLODipine 5 MG tablet Commonly known as: NORVASC Take 1 tablet (5 mg total) by mouth daily.   DULoxetine 60 MG capsule Commonly known as: CYMBALTA Take 2 capsules (120 mg total) by mouth daily.   fluticasone 50 MCG/ACT nasal spray Commonly known as: FLONASE Place 2 sprays into the nose daily as needed (allergies).   furosemide 40 MG tablet Commonly known as: LASIX Take 1 tablet (40 mg total) by mouth 2 (two) times daily as needed.   gabapentin 300 MG capsule Commonly known as: NEURONTIN TAKE 1 CAPSULE BY MOUTH THREE TIMES A DAY   HYDROcodone-acetaminophen 5-325 MG tablet Commonly known as: Norco Take 1 tablet by mouth every 6 (six) hours as needed for moderate pain.   ibuprofen 800 MG tablet Commonly known  as: ADVIL Take 1 tablet (800 mg total) by mouth every 6 (six) hours as needed.   losartan 100 MG tablet Commonly known as: COZAAR Take 1 tablet (100 mg total) by mouth daily.   meloxicam 7.5 MG tablet Commonly known as: MOBIC TAKE 1 TABLET BY MOUTH EVERY DAY   methocarbamol 750 MG tablet Commonly known as: ROBAXIN Take 750 mg by mouth 2 (two) times daily.   omeprazole 40 MG capsule Commonly known as: PRILOSEC Take 40 mg by mouth every morning.   oxyCODONE-acetaminophen 5-325 MG  tablet Commonly known as: Percocet Take 1 tablet by mouth every 4 (four) hours as needed for severe pain.   pravastatin 80 MG tablet Commonly known as: PRAVACHOL Take 1 tablet (80 mg total) by mouth daily.   traMADol 100 MG 24 hr tablet Commonly known as: ULTRAM-ER Take 100 mg by mouth daily.         Objective:   BP (!) 152/73   Pulse 82   Ht 5\' 6"  (1.676 m)   Wt 270 lb (122.5 kg)   SpO2 99%   BMI 43.58 kg/m   Wt Readings from Last 3 Encounters:  01/25/21 270 lb (122.5 kg)  10/25/20 263 lb (119.3 kg)  09/20/20 270 lb (122.5 kg)    Physical Exam Vitals and nursing note reviewed.  Constitutional:      General: She is not in acute distress.    Appearance: She is well-developed. She is not diaphoretic.  Eyes:     Conjunctiva/sclera: Conjunctivae normal.  Cardiovascular:     Rate and Rhythm: Normal rate and regular rhythm.     Heart sounds: Normal heart sounds. No murmur heard. Pulmonary:     Effort: Pulmonary effort is normal. No respiratory distress.     Breath sounds: Normal breath sounds. No wheezing.  Musculoskeletal:        General: No tenderness.  Skin:    General: Skin is warm and dry.     Findings: No rash.  Neurological:     Mental Status: She is alert and oriented to person, place, and time.     Coordination: Coordination normal.  Psychiatric:        Behavior: Behavior normal.      Assessment & Plan:   Problem List Items Addressed This Visit       Cardiovascular and Mediastinum   Essential hypertension, benign     Digestive   Gastroesophageal reflux disease     Endocrine   DM type 2 with diabetic dyslipidemia ([place]) - Primary   Relevant Orders   Bayer DCA Hb A1c Waived     Other   Hyperlipidemia   Other Visit Diagnoses     Leukocytosis, unspecified type       Relevant Orders   CBC with Differential/Platelet       A1c is 7.1, continue diet Follow up plan: Return in about 3 months (around 04/26/2021), or if symptoms worsen or  fail to improve, for diabetes and hld.  Counseling provided for all of the vaccine components Orders Placed This Encounter  Procedures   Bayer [place] Hb A1c Waived   CBC with Differential/Platelet    Caryl Pina, MD [facility] 01/25/2021, 1:46 PM

## 2021-01-26 DIAGNOSIS — M1712 Unilateral primary osteoarthritis, left knee: Secondary | ICD-10-CM | POA: Diagnosis not present

## 2021-01-26 DIAGNOSIS — M5137 Other intervertebral disc degeneration, lumbosacral region: Secondary | ICD-10-CM | POA: Diagnosis not present

## 2021-01-26 DIAGNOSIS — G894 Chronic pain syndrome: Secondary | ICD-10-CM | POA: Diagnosis not present

## 2021-01-26 DIAGNOSIS — M47817 Spondylosis without myelopathy or radiculopathy, lumbosacral region: Secondary | ICD-10-CM | POA: Diagnosis not present

## 2021-01-26 LAB — CBC WITH DIFFERENTIAL/PLATELET
Basophils Absolute: 0 10*3/uL (ref 0.0–0.2)
Basos: 0 %
EOS (ABSOLUTE): 0.2 10*3/uL (ref 0.0–0.4)
Eos: 3 %
Hematocrit: 40 % (ref 34.0–46.6)
Hemoglobin: 13.1 g/dL (ref 11.1–15.9)
Immature Grans (Abs): 0 10*3/uL (ref 0.0–0.1)
Immature Granulocytes: 0 %
Lymphocytes Absolute: 2.6 10*3/uL (ref 0.7–3.1)
Lymphs: 28 %
MCH: 29.8 pg (ref 26.6–33.0)
MCHC: 32.8 g/dL (ref 31.5–35.7)
MCV: 91 fL (ref 79–97)
Monocytes Absolute: 0.5 10*3/uL (ref 0.1–0.9)
Monocytes: 6 %
Neutrophils Absolute: 5.8 10*3/uL (ref 1.4–7.0)
Neutrophils: 63 %
Platelets: 138 10*3/uL — ABNORMAL LOW (ref 150–450)
RBC: 4.4 x10E6/uL (ref 3.77–5.28)
RDW: 13.5 % (ref 11.7–15.4)
WBC: 9.2 10*3/uL (ref 3.4–10.8)

## 2021-01-29 DIAGNOSIS — M9903 Segmental and somatic dysfunction of lumbar region: Secondary | ICD-10-CM | POA: Diagnosis not present

## 2021-01-29 DIAGNOSIS — M9905 Segmental and somatic dysfunction of pelvic region: Secondary | ICD-10-CM | POA: Diagnosis not present

## 2021-01-29 DIAGNOSIS — M5137 Other intervertebral disc degeneration, lumbosacral region: Secondary | ICD-10-CM | POA: Diagnosis not present

## 2021-01-29 DIAGNOSIS — M9904 Segmental and somatic dysfunction of sacral region: Secondary | ICD-10-CM | POA: Diagnosis not present

## 2021-02-01 DIAGNOSIS — M5137 Other intervertebral disc degeneration, lumbosacral region: Secondary | ICD-10-CM | POA: Diagnosis not present

## 2021-02-01 DIAGNOSIS — M9905 Segmental and somatic dysfunction of pelvic region: Secondary | ICD-10-CM | POA: Diagnosis not present

## 2021-02-01 DIAGNOSIS — M9904 Segmental and somatic dysfunction of sacral region: Secondary | ICD-10-CM | POA: Diagnosis not present

## 2021-02-01 DIAGNOSIS — M9903 Segmental and somatic dysfunction of lumbar region: Secondary | ICD-10-CM | POA: Diagnosis not present

## 2021-02-07 DIAGNOSIS — M9904 Segmental and somatic dysfunction of sacral region: Secondary | ICD-10-CM | POA: Diagnosis not present

## 2021-02-07 DIAGNOSIS — M9905 Segmental and somatic dysfunction of pelvic region: Secondary | ICD-10-CM | POA: Diagnosis not present

## 2021-02-07 DIAGNOSIS — M5137 Other intervertebral disc degeneration, lumbosacral region: Secondary | ICD-10-CM | POA: Diagnosis not present

## 2021-02-07 DIAGNOSIS — M9903 Segmental and somatic dysfunction of lumbar region: Secondary | ICD-10-CM | POA: Diagnosis not present

## 2021-02-08 ENCOUNTER — Ambulatory Visit
Admission: RE | Admit: 2021-02-08 | Discharge: 2021-02-08 | Disposition: A | Discharge status: Home / Self Care | Payer: Medicare Other | Source: Ambulatory Visit | Attending: Orthopaedic Surgery | Admitting: Orthopaedic Surgery

## 2021-02-08 ENCOUNTER — Other Ambulatory Visit: Payer: Self-pay

## 2021-02-08 ENCOUNTER — Encounter: Payer: Self-pay | Admitting: *Deleted

## 2021-02-08 DIAGNOSIS — M9905 Segmental and somatic dysfunction of pelvic region: Secondary | ICD-10-CM | POA: Diagnosis not present

## 2021-02-08 DIAGNOSIS — M9903 Segmental and somatic dysfunction of lumbar region: Secondary | ICD-10-CM | POA: Diagnosis not present

## 2021-02-08 DIAGNOSIS — M5137 Other intervertebral disc degeneration, lumbosacral region: Secondary | ICD-10-CM | POA: Diagnosis not present

## 2021-02-08 DIAGNOSIS — M25562 Pain in left knee: Secondary | ICD-10-CM | POA: Diagnosis not present

## 2021-02-08 DIAGNOSIS — M9904 Segmental and somatic dysfunction of sacral region: Secondary | ICD-10-CM | POA: Diagnosis not present

## 2021-02-08 DIAGNOSIS — Z96651 Presence of right artificial knee joint: Secondary | ICD-10-CM | POA: Diagnosis not present

## 2021-02-08 HISTORY — PX: IR RADIOLOGIST EVAL & MGMT: IMG5224

## 2021-02-08 NOTE — Consult Note (Signed)
Chief Complaint: Left knee pain  Referring Physician(s): Dalldorf,Peter  History of Present Illness: Tiffany Brown is a 75 y.o. female with past medical history significant hypertension, depression, hyperlipidemia, fibromyalgia and melanoma who was referred for evaluation for potential IOVERA therapy due to left knee pain.  Patient underwent a right total knee replacement in 2008.  She states that despite the right knee replacement she continues to experience right-sided knee pain however her left knee pain is significantly worse.  The patient has long history of left-sided knee pain however states it acutely worsened this past May.  She denies a fall or inciting injury though since that time has required a walker for ambulation around her house and with activities.  She is dependent on her husband for the majority of activities of daily living.  Patient states that her left knee pain at rest is 7 / 10 and worsens to approximately 9 / 10 with activity.  Patient states that Dr. Rhona Raider has offered her a left left total knee replacement however at this time she wishes to avoid this procedure.    She has undergone multiple steroid injections in the past however states while initially the injections resulted in significant improvement in her knee pain for several weeks to several months, recent injections have only resulted in transient improvement of her knee pain lasting 1 to 2 days.  She has not had a left knee injection for the past several months.  Additionally, the patient states that she underwent a "nerve ablation" procedure (she is unsure of the exact name of the procedure) at an outpatient pain center however states this did not improve her left knee pain in any meaningful way.   Past Medical History:  Diagnosis Date   Abnormal glucose    Depression with anxiety    Essential hypertension, benign    Fibromyalgia    Melanoma ([place])    Near syncope    Other and  unspecified hyperlipidemia     Past Surgical History:  Procedure Laterality Date   ABDOMINAL HYSTERECTOMY     BREAST SURGERY     LEFT   CHOLECYSTECTOMY     ELBOW SURGERY     X's 3 Left   RIGHT DISTAL RADIUS FRACTURE     TOTAL KNEE ARTHROPLASTY     RIGHT    Allergies: Other  Medications: Prior to Admission medications   Medication Sig Start Date End Date Taking? Authorizing Provider  amLODipine (NORVASC) 5 MG tablet Take 1 tablet (5 mg total) by mouth daily. 07/24/20   Dettinger, Fransisca Kaufmann, MD  DULoxetine (CYMBALTA) 60 MG capsule Take 2 capsules (120 mg total) by mouth daily. 07/24/20   Dettinger, Fransisca Kaufmann, MD  fluticasone (FLONASE) 50 MCG/ACT nasal spray Place 2 sprays into the nose daily as needed (allergies).     [provider]  furosemide (LASIX) 40 MG tablet Take 1 tablet (40 mg total) by mouth 2 (two) times daily as needed. 12/24/18   Dettinger, Fransisca Kaufmann, MD  gabapentin (NEURONTIN) 300 MG capsule TAKE 1 CAPSULE BY MOUTH THREE TIMES A DAY 12/06/20   Wallene Huh, DPM  HYDROcodone-acetaminophen (NORCO) 5-325 MG tablet Take 1 tablet by mouth every 6 (six) hours as needed for moderate pain. 12/18/20   Felipa Furnace, DPM  ibuprofen (ADVIL) 800 MG tablet Take 1 tablet (800 mg total) by mouth every 6 (six) hours as needed. 12/18/20   Felipa Furnace, DPM  losartan (COZAAR) 100 MG tablet Take 1 tablet (  100 mg total) by mouth daily. 07/24/20   Dettinger, Fransisca Kaufmann, MD  meloxicam (MOBIC) 7.5 MG tablet TAKE 1 TABLET BY MOUTH EVERY DAY 01/24/21   Dettinger, Fransisca Kaufmann, MD  methocarbamol (ROBAXIN) 750 MG tablet Take 750 mg by mouth 2 (two) times daily. 04/06/20   [provider]  omeprazole (PRILOSEC) 40 MG capsule Take 40 mg by mouth every morning. 06/02/19   [provider]  oxyCODONE-acetaminophen (PERCOCET) 5-325 MG tablet Take 1 tablet by mouth every 4 (four) hours as needed for severe pain. 12/18/20   Felipa Furnace, DPM  pravastatin (PRAVACHOL) 80 MG tablet Take 1  tablet (80 mg total) by mouth daily. 07/24/20   Dettinger, Fransisca Kaufmann, MD  traMADol (ULTRAM-ER) 100 MG 24 hr tablet Take 100 mg by mouth daily. 09/05/20   [provider]     Family History  Problem Relation Age of Onset   Cancer Mother        lymphoma   Heart disease Maternal Grandmother        no details   Suicidality Father    Lung cancer Sister     Social History   Socioeconomic History   Marital status: Married    Spouse name: jr   Number of children: 3   Years of education: 12   Highest education level: 12th grade  Occupational History   Occupation: Homemaker  Tobacco Use   Smoking status: Former    Packs/day: 0.80    Years: 25.00    Pack years: 20.00    Types: Cigarettes    Quit date: 05/06/1994    Years since quitting: 26.7   Smokeless tobacco: Never  Vaping Use   Vaping Use: Never used  Substance and Sexual Activity   Alcohol use: Never   Drug use: Never   Sexual activity: Yes    Birth control/protection: Post-menopausal    Comment: Married since 1964  Other Topics Concern   Not on file  Social History Narrative   Has 3 children. Lives with husband. One daughter lives close by   Social Determinants of Health   Financial Resource Strain: Low Risk    Difficulty of Paying Living Expenses: Not hard at all  Food Insecurity: No Food Insecurity   Worried About Charity fundraiser in the Last Year: Never true   Arboriculturist in the Last Year: Never true  Transportation Needs: No Transportation Needs   Lack of Transportation (Medical): No   Lack of Transportation (Non-Medical): No  Physical Activity: Inactive   Days of Exercise per Week: 0 days   Minutes of Exercise per Session: 0 min  Stress: Stress Concern Present   Feeling of Stress : Rather much  Social Connections: Socially Integrated   Frequency of Communication with Friends and Family: More than three times a week   Frequency of Social Gatherings with Friends and Family: Once a week   Attends  Religious Services: 1 to 4 times per year   Active Member of [employer] or Organizations: Yes   Attends Archivist Meetings: 1 to 4 times per year   Marital Status: Married    ECOG Status: 2 - Symptomatic, <50% confined to bed  Review of Systems  Review of Systems: A 12 point ROS discussed and pertinent positives are indicated in the HPI above.  All other systems are negative.  Physical Exam No direct physical exam was performed (except for noted visual exam findings with Video Visits).   Vital Signs:  There were no vitals taken for this visit.  Imaging:  Left radiograph - 08/30/2020 - moderate to severe tricompartmental generative change of the knee, worse within the medial compartment and patellofemoral joints with near complete joint space loss, subchondral sclerosis and osteophytosis.  Labs:  CBC: Recent Labs    07/24/20 1040 10/23/20 0858 01/25/21 1331  WBC 11.6* 15.7* 9.2  HGB 12.7 14.6 13.1  HCT 37.8 44.3 40.0  PLT 195 210 138*    COAGS: No results for input(s): INR, APTT in the last 8760 hours.  BMP: Recent Labs    07/24/20 1040 10/23/20 0858  NA 141 140  K 3.9 4.1  CL 100 96  CO2 27 27  GLUCOSE 134* 232*  BUN 16 19  CALCIUM 9.2 9.2  CREATININE 0.72 0.71    LIVER FUNCTION TESTS: Recent Labs    07/24/20 1040 10/23/20 0858  BILITOT 0.4 0.4  AST 18 11  ALT 17 18  ALKPHOS 134* 156*  PROT 6.7 6.4  ALBUMIN 4.5 4.0    TUMOR MARKERS: No results for input(s): AFPTM, CEA, CA199, CHROMGRNA in the last 8760 hours.  Assessment and Plan:  Tiffany Brown is a 75 y.o. female with past medical history significant hypertension, depression, hyperlipidemia, fibromyalgia and melanoma who was referred for evaluation for potential IOVERA therapy due to left knee pain.  The patient has long history of left-sided knee pain however states it acutely worsened this past May.  She denies a fall or inciting injury though since that time has required a walker  for ambulation around her house and with activities.  She is dependent on her husband for the majority of activities of daily living.  Patient states that her left knee pain at rest is 7 / 10 and worsens to approximately 9 / 10 with activity.  Patient states that Dr. Rhona Raider has offered her a left left total knee replacement however at this time she wishes to avoid this procedure.    Review of left knee radiograph performed 08/30/2020 demonstrated moderate to severe tricompartmental generative change of the knee, worse within the medial compartment and patellofemoral joints with near complete joint space loss, subchondral sclerosis and osteophytosis.  Given above, I feel the patient is a good candidate for IOVERA trretment.  I explained that the [place] treatment is a new modality that utilizes cyroneurolysis technology to temporarily alter the pain recepting nerves supplying the anterior aspect of the knee, in hopes of achieving a clinically significant reduction in knee pain.     I explained that if successful, the patient will experience a rapid pain reduction which can last for approximately 3 months.  I explained that while their knee pain may be reduced, the structural damage of the knee remains, and thus, this is not a curative technology and their knee pain will return.     Risks associated with the procedure include bleeding/bruising, infection and post procedural paresthesias/weakness.   The procedure is performed as an outpatient basis at [facility].  The procedure is performed soley with local anesthesia, and therefore the patient does need to be NPO, there is no postprocedural recovery and the patient does not need a driver home.  Additionally, the patient does not have to hold any other their medications, including anticoagulation.  On the day of the procedure, the patient is encouraged to wear either shorts or loose fitting pants that can be rolled up to the upper thigh.      Following this prolonged and detailed  conversation, the patient wishes to pursue IOVERA therapy of the left knee.    (Note, I also explained that if successful, patient could undergo IOVERA therapy of her right knee as desired.)  As such, pending insurance approval, this procedure would be scheduled at [facility] at the next earliest convenience.    The patient knows to call the interventional radiology clinic with any interval questions or concerns  Thank you for this interesting consult.  I greatly enjoyed meeting Tiffany Brown and look forward to participating in their care.  A copy of this report was sent to the requesting provider on this date.  Electronically Signed: Sandi Mariscal 02/08/2021, 8:50 AM   I spent a total of 15 Minutes in remote  clinical consultation, greater than 50% of which was counseling/coordinating care for left sided knee pain.    Visit type: Audio only (telephone). Audio (no video) only due to patient's lack of internet/smartphone capability. Alternative for in-person consultation at [facility], [address], [place], [place]. This visit type was conducted due to national recommendations for restrictions regarding the COVID-19 Pandemic (e.g. social distancing).  This format is felt to be most appropriate for this patient at this time.  All issues noted in this document were discussed and addressed.

## 2021-02-09 ENCOUNTER — Other Ambulatory Visit (HOSPITAL_COMMUNITY): Payer: Self-pay | Admitting: Interventional Radiology

## 2021-02-09 DIAGNOSIS — M25561 Pain in right knee: Secondary | ICD-10-CM

## 2021-02-12 DIAGNOSIS — M5137 Other intervertebral disc degeneration, lumbosacral region: Secondary | ICD-10-CM | POA: Diagnosis not present

## 2021-02-12 DIAGNOSIS — M9904 Segmental and somatic dysfunction of sacral region: Secondary | ICD-10-CM | POA: Diagnosis not present

## 2021-02-12 DIAGNOSIS — M9905 Segmental and somatic dysfunction of pelvic region: Secondary | ICD-10-CM | POA: Diagnosis not present

## 2021-02-12 DIAGNOSIS — M9903 Segmental and somatic dysfunction of lumbar region: Secondary | ICD-10-CM | POA: Diagnosis not present

## 2021-02-15 ENCOUNTER — Other Ambulatory Visit: Payer: Self-pay

## 2021-02-15 ENCOUNTER — Ambulatory Visit (HOSPITAL_COMMUNITY)
Admission: RE | Admit: 2021-02-15 | Discharge: 2021-02-15 | Disposition: A | Discharge status: Home / Self Care | Payer: Medicare Other | Source: Ambulatory Visit | Attending: Interventional Radiology | Admitting: Interventional Radiology

## 2021-02-15 ENCOUNTER — Other Ambulatory Visit (HOSPITAL_COMMUNITY): Payer: Self-pay | Admitting: Interventional Radiology

## 2021-02-15 DIAGNOSIS — M25562 Pain in left knee: Secondary | ICD-10-CM | POA: Diagnosis not present

## 2021-02-15 DIAGNOSIS — M25561 Pain in right knee: Secondary | ICD-10-CM

## 2021-02-15 HISTORY — PX: IR ABLATE LIVER CRYOABLATION: IMG5524

## 2021-02-21 ENCOUNTER — Ambulatory Visit (INDEPENDENT_AMBULATORY_CARE_PROVIDER_SITE_OTHER): Payer: Medicare Other | Admitting: Podiatry

## 2021-02-21 ENCOUNTER — Other Ambulatory Visit: Payer: Self-pay

## 2021-02-21 DIAGNOSIS — S86311A Strain of muscle(s) and tendon(s) of peroneal muscle group at lower leg level, right leg, initial encounter: Secondary | ICD-10-CM

## 2021-02-21 DIAGNOSIS — Z9889 Other specified postprocedural states: Secondary | ICD-10-CM

## 2021-02-22 ENCOUNTER — Other Ambulatory Visit: Payer: Self-pay | Admitting: Podiatry

## 2021-02-22 DIAGNOSIS — M9905 Segmental and somatic dysfunction of pelvic region: Secondary | ICD-10-CM | POA: Diagnosis not present

## 2021-02-22 DIAGNOSIS — M5137 Other intervertebral disc degeneration, lumbosacral region: Secondary | ICD-10-CM | POA: Diagnosis not present

## 2021-02-22 DIAGNOSIS — M9903 Segmental and somatic dysfunction of lumbar region: Secondary | ICD-10-CM | POA: Diagnosis not present

## 2021-02-22 DIAGNOSIS — M9904 Segmental and somatic dysfunction of sacral region: Secondary | ICD-10-CM | POA: Diagnosis not present

## 2021-02-22 NOTE — Telephone Encounter (Signed)
Medication refilled

## 2021-02-23 DIAGNOSIS — Z79891 Long term (current) use of opiate analgesic: Secondary | ICD-10-CM | POA: Diagnosis not present

## 2021-02-23 DIAGNOSIS — G894 Chronic pain syndrome: Secondary | ICD-10-CM | POA: Diagnosis not present

## 2021-02-23 DIAGNOSIS — M5137 Other intervertebral disc degeneration, lumbosacral region: Secondary | ICD-10-CM | POA: Diagnosis not present

## 2021-02-23 DIAGNOSIS — Z79899 Other long term (current) drug therapy: Secondary | ICD-10-CM | POA: Diagnosis not present

## 2021-02-23 DIAGNOSIS — M47817 Spondylosis without myelopathy or radiculopathy, lumbosacral region: Secondary | ICD-10-CM | POA: Diagnosis not present

## 2021-02-23 DIAGNOSIS — M1712 Unilateral primary osteoarthritis, left knee: Secondary | ICD-10-CM | POA: Diagnosis not present

## 2021-02-23 NOTE — Progress Notes (Signed)
Subjective:  Patient ID: Tiffany Brown, female    DOB: 31-Jul-1945,  MRN: [id number]  No chief complaint on file.   DOS: 12/18/2020 Procedure: Right peroneal tendon repair with tenodesis  75 y.o. female returns for post-op check.  Patient has been weightbearing as tolerated in boot with the walker.  She states that she is doing a lot better.  She states that she did not go to physical therapy however she was able to do at home physical therapy and she has returned to regular shoes without any restrictions  Review of Systems: Negative except as noted in the HPI. Denies N/V/F/Ch.  Past Medical History:  Diagnosis Date   Abnormal glucose    Depression with anxiety    Essential hypertension, benign    Fibromyalgia    Melanoma ([place])    Near syncope    Other and unspecified hyperlipidemia     Current Outpatient Medications:    amLODipine (NORVASC) 5 MG tablet, Take 1 tablet (5 mg total) by mouth daily., Disp: 90 tablet, Rfl: 3   DULoxetine (CYMBALTA) 60 MG capsule, Take 2 capsules (120 mg total) by mouth daily., Disp: 180 capsule, Rfl: 3   fluticasone (FLONASE) 50 MCG/ACT nasal spray, Place 2 sprays into the nose daily as needed (allergies). , Disp: , Rfl:    furosemide (LASIX) 40 MG tablet, Take 1 tablet (40 mg total) by mouth 2 (two) times daily as needed., Disp: 180 tablet, Rfl: 3   gabapentin (NEURONTIN) 300 MG capsule, TAKE 1 CAPSULE BY MOUTH THREE TIMES A DAY, Disp: 90 capsule, Rfl: 3   HYDROcodone-acetaminophen (NORCO) 5-325 MG tablet, Take 1 tablet by mouth every 6 (six) hours as needed for moderate pain., Disp: 30 tablet, Rfl: 0   ibuprofen (ADVIL) 800 MG tablet, TAKE 1 TABLET BY MOUTH EVERY 6 HOURS AS NEEDED., Disp: 60 tablet, Rfl: 1   losartan (COZAAR) 100 MG tablet, Take 1 tablet (100 mg total) by mouth daily., Disp: 90 tablet, Rfl: 3   meloxicam (MOBIC) 7.5 MG tablet, TAKE 1 TABLET BY MOUTH EVERY DAY, Disp: 90 tablet, Rfl: 3   methocarbamol (ROBAXIN) 750 MG tablet, Take 750  mg by mouth 2 (two) times daily., Disp: , Rfl:    omeprazole (PRILOSEC) 40 MG capsule, Take 40 mg by mouth every morning., Disp: , Rfl:    oxyCODONE-acetaminophen (PERCOCET) 5-325 MG tablet, Take 1 tablet by mouth every 4 (four) hours as needed for severe pain., Disp: 30 tablet, Rfl: 0   pravastatin (PRAVACHOL) 80 MG tablet, Take 1 tablet (80 mg total) by mouth daily., Disp: 90 tablet, Rfl: 3   traMADol (ULTRAM-ER) 100 MG 24 hr tablet, Take 100 mg by mouth daily., Disp: , Rfl:   Social History   Tobacco Use  Smoking Status Former   Packs/day: 0.80   Years: 25.00   Pack years: 20.00   Types: Cigarettes   Quit date: 05/06/1994   Years since quitting: 26.8  Smokeless Tobacco Never    Allergies  Allergen Reactions   Other    Objective:  There were no vitals filed for this visit. There is no height or weight on file to calculate BMI. Constitutional Well developed. Well nourished.  Vascular Foot warm and well perfused. Capillary refill normal to all digits.   Neurologic Normal speech. Oriented to person, place, and time. Epicritic sensation to light touch grossly present bilaterally.  Dermatologic Skin skin incision completely closed.  Good range of motion and strength noted of the peroneal tendon.  Peroneal tendon  strength is 4 out of 5.  Orthopedic: No further tenderness to palpation noted about the surgical site.   Radiographs: None Assessment:   1. Peroneal tendon tear, right, initial encounter   2. Status post foot surgery       Plan:  Patient was evaluated and treated and all questions answered.  S/p foot surgery right -Progressing as expected post-operatively. -XR: None -WB Status: Weightbearing as tolerated in regular shoes -Sutures: None -She has returned to regular activities without restrictions.  At this time patient is officially discharged from my care if foot and ankle issues arise in future of asked her to come back and see me.  No follow-ups on file.

## 2021-02-26 DIAGNOSIS — M1712 Unilateral primary osteoarthritis, left knee: Secondary | ICD-10-CM | POA: Diagnosis not present

## 2021-02-27 ENCOUNTER — Other Ambulatory Visit: Payer: Self-pay | Admitting: Interventional Radiology

## 2021-02-27 DIAGNOSIS — M25561 Pain in right knee: Secondary | ICD-10-CM

## 2021-02-28 DIAGNOSIS — M9905 Segmental and somatic dysfunction of pelvic region: Secondary | ICD-10-CM | POA: Diagnosis not present

## 2021-02-28 DIAGNOSIS — M9903 Segmental and somatic dysfunction of lumbar region: Secondary | ICD-10-CM | POA: Diagnosis not present

## 2021-02-28 DIAGNOSIS — M9904 Segmental and somatic dysfunction of sacral region: Secondary | ICD-10-CM | POA: Diagnosis not present

## 2021-02-28 DIAGNOSIS — M5137 Other intervertebral disc degeneration, lumbosacral region: Secondary | ICD-10-CM | POA: Diagnosis not present

## 2021-03-07 DIAGNOSIS — M9904 Segmental and somatic dysfunction of sacral region: Secondary | ICD-10-CM | POA: Diagnosis not present

## 2021-03-07 DIAGNOSIS — M9905 Segmental and somatic dysfunction of pelvic region: Secondary | ICD-10-CM | POA: Diagnosis not present

## 2021-03-07 DIAGNOSIS — M5137 Other intervertebral disc degeneration, lumbosacral region: Secondary | ICD-10-CM | POA: Diagnosis not present

## 2021-03-07 DIAGNOSIS — M9903 Segmental and somatic dysfunction of lumbar region: Secondary | ICD-10-CM | POA: Diagnosis not present

## 2021-03-14 ENCOUNTER — Other Ambulatory Visit: Payer: Self-pay

## 2021-03-14 ENCOUNTER — Encounter: Payer: Self-pay | Admitting: *Deleted

## 2021-03-14 ENCOUNTER — Ambulatory Visit
Admission: RE | Admit: 2021-03-14 | Discharge: 2021-03-14 | Disposition: A | Discharge status: Home / Self Care | Payer: Medicare Other | Source: Ambulatory Visit | Attending: Interventional Radiology | Admitting: Interventional Radiology

## 2021-03-14 DIAGNOSIS — Z9889 Other specified postprocedural states: Secondary | ICD-10-CM | POA: Diagnosis not present

## 2021-03-14 DIAGNOSIS — M25561 Pain in right knee: Secondary | ICD-10-CM

## 2021-03-14 DIAGNOSIS — Z96651 Presence of right artificial knee joint: Secondary | ICD-10-CM | POA: Diagnosis not present

## 2021-03-14 DIAGNOSIS — M25562 Pain in left knee: Secondary | ICD-10-CM | POA: Diagnosis not present

## 2021-03-14 HISTORY — PX: IR RADIOLOGIST EVAL & MGMT: IMG5224

## 2021-03-14 NOTE — Progress Notes (Signed)
Patient ID: Tiffany Brown, female   DOB: 06/11/1945, 75 y.o.   MRN: [id number]        Chief Complaint: Left knee pain   Referring Physician(s): Dalldorf,Peter   History of Present Illness: Tiffany Brown is a 75 y.o. female with past medical history significant hypertension, depression, hyperlipidemia, fibromyalgia and melanoma who is seen today in telemedicine consultation following technically successful left knee IOVERA procedure performed 02/15/2021.  In brief review, the patient underwent a right total knee replacement in 2008.  She states that despite the right knee replacement she continues to experience right-sided knee pain however her left knee pain is significantly worse.  The patient has long history of left-sided knee pain however states it acutely worsened this past May.  She denies a fall or inciting injury though since that time has required a walker for ambulation around her house and with activities.  She is dependent on her husband for the majority of activities of daily living.  Patient states that her left knee pain at rest is 7 / 10 and worsens to approximately 9 / 10 with activity.  Patient states that Dr. Rhona Raider has offered her a left left total knee replacement however at this time she wishes to avoid this procedure.     She has undergone multiple steroid injections in the past however states while initially the injections resulted in significant improvement in her knee pain for several weeks to several months, recent injections have only resulted in transient improvement of her knee pain lasting 1 to 2 days.  She has not had a left knee injection for the past several months.  Additionally, the patient states that she underwent a "nerve ablation" procedure (she is unsure of the exact name of the procedure) at an outpatient pain center however states this did not improve her left knee pain in any meaningful way.  Immediately following the procedure, the patient  rated her left knee pain at rest as 2 out of 10 stating her knee pain slightly improved with activity rating her knee pain as 1 out of 10.   Past Medical History:  Diagnosis Date   Abnormal glucose    Depression with anxiety    Essential hypertension, benign    Fibromyalgia    Melanoma ([place])    Near syncope    Other and unspecified hyperlipidemia     Past Surgical History:  Procedure Laterality Date   ABDOMINAL HYSTERECTOMY     BREAST SURGERY     LEFT   CHOLECYSTECTOMY     ELBOW SURGERY     X's 3 Left   IR ABLATE LIVER CRYOABLATION  02/15/2021   IR RADIOLOGIST EVAL & MGMT  02/08/2021   RIGHT DISTAL RADIUS FRACTURE     TOTAL KNEE ARTHROPLASTY     RIGHT    Allergies: Other  Medications: Prior to Admission medications   Medication Sig Start Date End Date Taking? Authorizing Provider  amLODipine (NORVASC) 5 MG tablet Take 1 tablet (5 mg total) by mouth daily. 07/24/20   Dettinger, Fransisca Kaufmann, MD  DULoxetine (CYMBALTA) 60 MG capsule Take 2 capsules (120 mg total) by mouth daily. 07/24/20   Dettinger, Fransisca Kaufmann, MD  fluticasone (FLONASE) 50 MCG/ACT nasal spray Place 2 sprays into the nose daily as needed (allergies).     [provider]  furosemide (LASIX) 40 MG tablet Take 1 tablet (40 mg total) by mouth 2 (two) times daily as needed. 12/24/18   Dettinger, Fransisca Kaufmann, MD  gabapentin (  NEURONTIN) 300 MG capsule TAKE 1 CAPSULE BY MOUTH THREE TIMES A DAY 12/06/20   Wallene Huh, DPM  HYDROcodone-acetaminophen (NORCO) 5-325 MG tablet Take 1 tablet by mouth every 6 (six) hours as needed for moderate pain. 12/18/20   Felipa Furnace, DPM  ibuprofen (ADVIL) 800 MG tablet TAKE 1 TABLET BY MOUTH EVERY 6 HOURS AS NEEDED. 02/22/21   Felipa Furnace, DPM  losartan (COZAAR) 100 MG tablet Take 1 tablet (100 mg total) by mouth daily. 07/24/20   Dettinger, Fransisca Kaufmann, MD  meloxicam (MOBIC) 7.5 MG tablet TAKE 1 TABLET BY MOUTH EVERY DAY 01/24/21   Dettinger, Fransisca Kaufmann, MD  methocarbamol (ROBAXIN) 750  MG tablet Take 750 mg by mouth 2 (two) times daily. 04/06/20   [provider]  omeprazole (PRILOSEC) 40 MG capsule Take 40 mg by mouth every morning. 06/02/19   [provider]  oxyCODONE-acetaminophen (PERCOCET) 5-325 MG tablet Take 1 tablet by mouth every 4 (four) hours as needed for severe pain. 12/18/20   Felipa Furnace, DPM  pravastatin (PRAVACHOL) 80 MG tablet Take 1 tablet (80 mg total) by mouth daily. 07/24/20   Dettinger, Fransisca Kaufmann, MD  traMADol (ULTRAM-ER) 100 MG 24 hr tablet Take 100 mg by mouth daily. 09/05/20   [provider]     Family History  Problem Relation Age of Onset   Cancer Mother        lymphoma   Heart disease Maternal Grandmother        no details   Suicidality Father    Lung cancer Sister     Social History   Socioeconomic History   Marital status: Married    Spouse name: jr   Number of children: 3   Years of education: 12   Highest education level: 12th grade  Occupational History   Occupation: Homemaker  Tobacco Use   Smoking status: Former    Packs/day: 0.80    Years: 25.00    Pack years: 20.00    Types: Cigarettes    Quit date: 05/06/1994    Years since quitting: 26.8   Smokeless tobacco: Never  Vaping Use   Vaping Use: Never used  Substance and Sexual Activity   Alcohol use: Never   Drug use: Never   Sexual activity: Yes    Birth control/protection: Post-menopausal    Comment: Married since 1964  Other Topics Concern   Not on file  Social History Narrative   Has 3 children. Lives with husband. One daughter lives close by   Social Determinants of Health   Financial Resource Strain: Low Risk    Difficulty of Paying Living Expenses: Not hard at all  Food Insecurity: No Food Insecurity   Worried About Charity fundraiser in the Last Year: Never true   Arboriculturist in the Last Year: Never true  Transportation Needs: No Transportation Needs   Lack of Transportation (Medical): No   Lack of Transportation  (Non-Medical): No  Physical Activity: Inactive   Days of Exercise per Week: 0 days   Minutes of Exercise per Session: 0 min  Stress: Stress Concern Present   Feeling of Stress : Rather much  Social Connections: Socially Integrated   Frequency of Communication with Friends and Family: More than three times a week   Frequency of Social Gatherings with Friends and Family: Once a week   Attends Religious Services: 1 to 4 times per year   Active Member of [employer] or Organizations: Yes  Attends Archivist Meetings: 1 to 4 times per year   Marital Status: Married    ECOG Status: 1 - Symptomatic but completely ambulatory  Review of Systems  Review of Systems: A 12 point ROS discussed and pertinent positives are indicated in the HPI above.  All other systems are negative.  Physical Exam No direct physical exam was performed (except for noted visual exam findings with Video Visits).   Vital Signs: There were no vitals taken for this visit.  Imaging: IR US GUIDED PERIPHERAL NERVE CRYOABLATION KNEE LEFT  Result Date: 02/16/2021 INDICATION: Left sided knee pain. Please refer to formal consultation in the [facility] for additional details. EXAM: 1. ULTRASOUND-GUIDED CRYONEUROLYSIS (IOVERA) OF THE INTERMEDIATE DIVISION OF THE ANTERIOR FEMORAL CUTANEOUS NERVE (IAFCN) 2. ULTRASOUND-GUIDED CRYONEUROLYSIS (IOVERA) OF THE MEDIAL DIVISION OF THE ANTERIOR FEMORAL CUTANEOUS NERVE (MAFCN) 3. ULTRASOUND-GUIDED CRYONEUROLYSIS (IOVERA) OF THE LATERAL FEMORAL CUTANEOUS NERVE (LFCN) 4. ULTRASOUND-GUIDED CRYONEUROLYSIS (IOVERA) OF THE INFRAPATELLAR BRANCH OF THE SAPHENOUS NERVE (ISN) COMPARISON:  None MEDICATIONS: None ANESTHESIA/SEDATION: None FLUOROSCOPY TIME:  None COMPLICATIONS: None immediate. TECHNIQUE: Informed written consent was obtained from the patient after a thorough discussion of the procedural risks, benefits and alternatives. All questions were addressed. A timeout was performed prior to  the initiation of the procedure. The patient was positioned supine and sonographic evaluation of the affected thigh and knee was performed by the dictating interventional radiologist. Selected nerves were marked with a skin pen and the procedure was planned. Attention was first paid towards cryoablation of the intermediate division of the anterior femoral cutaneous nerve (IAFCN). The skin overlying the selected dermal access site was cleaned with chlorhexidine. Local anesthesia was provided with a combination of topical anesthetic spray followed by subcutaneous administration 1% lidocaine with epinephrine. Next, under direct ultrasound guidance, the 20 gauge Smart Tip 190 IOVERA cryoneurolysis probe was advanced with tip ultimately positioned immediately adjacent to the targeted nerve. Multiple ultrasound images were saved for procedural documentation purposes. The cryoneurolysis was then performed for 1 minute, 45 seconds for a total of 1 cycle(s) with intermittent sonographer evaluation. The identical procedure was repeated targeting the medial division of the anterior femoral cutaneous nerve (MAFCN), the lateral femoral cutaneous nerve (LFCN) and the infrapatellar branch of the saphenous nerve (ISN). The cryoneurolysis was performed for each of the above nerve for 1 minute, 45 seconds for a total of 1 cycle each, all with intermittent sonographer evaluation. Multiple ultrasound images were saved for procedural documentation purposes. Following completion of the procedure, sensation about the anterior aspect of the knee was assessed and a postprocedural pain scale was obtained. Dressings were applied. The patient tolerated the procedure well without immediate postprocedural complication. FINDINGS: Sonographic guidance confirms appropriate positioning of the Smart Tip 190 IOVERA cryoneurolysis probe immediately adjacent to the targeted nerves with reduction in patient's postprocedural pain scale as follows: Pre  procedural WOMAC score: 75/96 Pre procedural knee pain: At rest: 7/10 With activity: 9/10 Post procedural knee pain: At rest: 2/10 With activity: 1/10 IMPRESSION: Technically successful cryoneurolysis (IOVERA) of the IAFCN, MAFCN, LFCN and the ISN for left sided knee pain with clinically significant reduction in postprocedural pain scale. Electronically Signed   By: Sandi Mariscal M.D.   On: 02/16/2021 09:25    Labs:  CBC: Recent Labs    07/24/20 1040 10/23/20 0858 01/25/21 1331  WBC 11.6* 15.7* 9.2  HGB 12.7 14.6 13.1  HCT 37.8 44.3 40.0  PLT 195 210 138*    COAGS: No results for  input(s): INR, APTT in the last 8760 hours.  BMP: Recent Labs    07/24/20 1040 10/23/20 0858  NA 141 140  K 3.9 4.1  CL 100 96  CO2 27 27  GLUCOSE 134* 232*  BUN 16 19  CALCIUM 9.2 9.2  CREATININE 0.72 0.71    LIVER FUNCTION TESTS: Recent Labs    07/24/20 1040 10/23/20 0858  BILITOT 0.4 0.4  AST 18 11  ALT 17 18  ALKPHOS 134* 156*  PROT 6.7 6.4  ALBUMIN 4.5 4.0    TUMOR MARKERS: No results for input(s): AFPTM, CEA, CA199, CHROMGRNA in the last 8760 hours.  Assessment and Plan:  Tiffany Brown is a 75 y.o. female with past medical history significant hypertension, depression, hyperlipidemia, fibromyalgia and melanoma who is seen today in telemedicine consultation following technically successful left knee IOVERA procedure performed 02/15/2021.  Prior to the procedure, patient rated her left knee pain as 7 / 10 at rest, worsening to 9 / 10 with activity.  Immediately following the procedure, the patient rated her left knee pain at rest as 2 out of 10 stating her knee pain slightly improved with activity rating her knee pain as 1 out of 10.  Unfortunately, the patient states that her left knee pain release only lasted approximately 1 day.  While she states there is minimal improvement in her lateral sided left knee pain her anterior and medial knee pain returned the following  day.  Currently, the patient rates her pain as 5 / 10 at rest and 8-9 / 10 with activity.  The patient is not interested in undergoing a repeat IOVERA but rather has [facility] to [facility] who has offered her an intra-articular gel injection given her desire to avoid undergoing a knee replacement.  While the patient is disappointed she did not get a more durable relief from the procedure, she is pleased and thankful for our efforts.  She knows to call the interventional radiology clinic with any future questions or concerns.  Thank you for this interesting consult.  I greatly enjoyed meeting Tiffany Brown and look forward to participating in their care.  A copy of this report was sent to the requesting provider on this date.  Electronically Signed: Sandi Mariscal 03/14/2021, 1:58 PM   I spent a total of 5 Minutes in remote  clinical consultation, greater than 50% of which was counseling/coordinating care for left knee pain.    Visit type: Audio only (telephone). Audio (no video) only due to patient's lack of internet/smartphone capability. Alternative for in-person consultation at [facility], [address], [place], [place]. This visit type was conducted due to national recommendations for restrictions regarding the COVID-19 Pandemic (e.g. social distancing).  This format is felt to be most appropriate for this patient at this time.  All issues noted in this document were discussed and addressed.

## 2021-03-21 DIAGNOSIS — M1712 Unilateral primary osteoarthritis, left knee: Secondary | ICD-10-CM | POA: Diagnosis not present

## 2021-03-22 DIAGNOSIS — M797 Fibromyalgia: Secondary | ICD-10-CM | POA: Diagnosis not present

## 2021-03-22 DIAGNOSIS — M5137 Other intervertebral disc degeneration, lumbosacral region: Secondary | ICD-10-CM | POA: Diagnosis not present

## 2021-03-22 DIAGNOSIS — I872 Venous insufficiency (chronic) (peripheral): Secondary | ICD-10-CM | POA: Diagnosis not present

## 2021-03-22 DIAGNOSIS — M47812 Spondylosis without myelopathy or radiculopathy, cervical region: Secondary | ICD-10-CM | POA: Diagnosis not present

## 2021-03-22 DIAGNOSIS — M1712 Unilateral primary osteoarthritis, left knee: Secondary | ICD-10-CM | POA: Diagnosis not present

## 2021-03-22 DIAGNOSIS — M47817 Spondylosis without myelopathy or radiculopathy, lumbosacral region: Secondary | ICD-10-CM | POA: Diagnosis not present

## 2021-03-22 DIAGNOSIS — G894 Chronic pain syndrome: Secondary | ICD-10-CM | POA: Diagnosis not present

## 2021-03-22 DIAGNOSIS — Z79891 Long term (current) use of opiate analgesic: Secondary | ICD-10-CM | POA: Diagnosis not present

## 2021-03-27 ENCOUNTER — Other Ambulatory Visit: Payer: Self-pay | Admitting: Podiatry

## 2021-03-28 DIAGNOSIS — M1712 Unilateral primary osteoarthritis, left knee: Secondary | ICD-10-CM | POA: Diagnosis not present

## 2021-04-04 DIAGNOSIS — M1712 Unilateral primary osteoarthritis, left knee: Secondary | ICD-10-CM | POA: Diagnosis not present

## 2021-04-18 DIAGNOSIS — M1712 Unilateral primary osteoarthritis, left knee: Secondary | ICD-10-CM | POA: Diagnosis not present

## 2021-04-20 ENCOUNTER — Other Ambulatory Visit: Payer: Self-pay

## 2021-04-20 ENCOUNTER — Ambulatory Visit: Payer: Medicare Other | Admitting: Podiatry

## 2021-04-20 DIAGNOSIS — M6780 Other specified disorders of synovium and tendon, unspecified site: Secondary | ICD-10-CM

## 2021-04-20 DIAGNOSIS — M216X1 Other acquired deformities of right foot: Secondary | ICD-10-CM

## 2021-04-23 ENCOUNTER — Other Ambulatory Visit: Payer: Self-pay | Admitting: Podiatry

## 2021-04-24 DIAGNOSIS — M47817 Spondylosis without myelopathy or radiculopathy, lumbosacral region: Secondary | ICD-10-CM | POA: Diagnosis not present

## 2021-04-24 DIAGNOSIS — M5137 Other intervertebral disc degeneration, lumbosacral region: Secondary | ICD-10-CM | POA: Diagnosis not present

## 2021-04-24 DIAGNOSIS — M1712 Unilateral primary osteoarthritis, left knee: Secondary | ICD-10-CM | POA: Diagnosis not present

## 2021-04-24 DIAGNOSIS — M47812 Spondylosis without myelopathy or radiculopathy, cervical region: Secondary | ICD-10-CM | POA: Diagnosis not present

## 2021-04-24 NOTE — Telephone Encounter (Signed)
Please advise 

## 2021-04-25 DIAGNOSIS — M1712 Unilateral primary osteoarthritis, left knee: Secondary | ICD-10-CM | POA: Diagnosis not present

## 2021-04-25 NOTE — Progress Notes (Signed)
Subjective:  Patient ID: Tiffany Brown, female    DOB: 1945-06-29,  MRN: [id number]  Chief Complaint  Patient presents with   Foot Pain    Right foot pain    75 y.o. female presents with the above complaint.  Patient presents with complaint of peroneal weakness to the right foot status post peroneal tendon repair on 12/18/2020 by me.  She states that she still having some trouble with the right foot especially with ambulation.  She wanted to hold off for physical therapy while we were treating it acutely however she is ready go to physical therapy at this time.  She states the brace helps a little bit.  She denies any other acute complaints.  Review of Systems: Negative except as noted in the HPI. Denies N/V/F/Ch.  Past Medical History:  Diagnosis Date   Abnormal glucose    Depression with anxiety    Essential hypertension, benign    Fibromyalgia    Melanoma ([place])    Near syncope    Other and unspecified hyperlipidemia     Current Outpatient Medications:    amLODipine (NORVASC) 5 MG tablet, Take 1 tablet (5 mg total) by mouth daily., Disp: 90 tablet, Rfl: 3   DULoxetine (CYMBALTA) 60 MG capsule, Take 2 capsules (120 mg total) by mouth daily., Disp: 180 capsule, Rfl: 3   fluticasone (FLONASE) 50 MCG/ACT nasal spray, Place 2 sprays into the nose daily as needed (allergies). , Disp: , Rfl:    furosemide (LASIX) 40 MG tablet, Take 1 tablet (40 mg total) by mouth 2 (two) times daily as needed., Disp: 180 tablet, Rfl: 3   gabapentin (NEURONTIN) 300 MG capsule, TAKE 1 CAPSULE BY MOUTH THREE TIMES A DAY, Disp: 90 capsule, Rfl: 3   HYDROcodone-acetaminophen (NORCO) 5-325 MG tablet, Take 1 tablet by mouth every 6 (six) hours as needed for moderate pain., Disp: 30 tablet, Rfl: 0   ibuprofen (ADVIL) 800 MG tablet, TAKE 1 TABLET BY MOUTH EVERY 6 HOURS AS NEEDED, Disp: 60 tablet, Rfl: 1   losartan (COZAAR) 100 MG tablet, Take 1 tablet (100 mg total) by mouth daily., Disp: 90 tablet, Rfl: 3    meloxicam (MOBIC) 7.5 MG tablet, TAKE 1 TABLET BY MOUTH EVERY DAY, Disp: 90 tablet, Rfl: 3   methocarbamol (ROBAXIN) 750 MG tablet, Take 750 mg by mouth 2 (two) times daily., Disp: , Rfl:    omeprazole (PRILOSEC) 40 MG capsule, Take 40 mg by mouth every morning., Disp: , Rfl:    oxyCODONE-acetaminophen (PERCOCET) 5-325 MG tablet, Take 1 tablet by mouth every 4 (four) hours as needed for severe pain., Disp: 30 tablet, Rfl: 0   pravastatin (PRAVACHOL) 80 MG tablet, Take 1 tablet (80 mg total) by mouth daily., Disp: 90 tablet, Rfl: 3   traMADol (ULTRAM-ER) 100 MG 24 hr tablet, Take 100 mg by mouth daily., Disp: , Rfl:   Social History   Tobacco Use  Smoking Status Former   Packs/day: 0.80   Years: 25.00   Pack years: 20.00   Types: Cigarettes   Quit date: 05/06/1994   Years since quitting: 26.9  Smokeless Tobacco Never    Allergies  Allergen Reactions   Other    Objective:  There were no vitals filed for this visit. There is no height or weight on file to calculate BMI. Constitutional Well developed. Well nourished.  Vascular Dorsalis pedis pulses palpable bilaterally. Posterior tibial pulses palpable bilaterally. Capillary refill normal to all digits.  No cyanosis or clubbing noted. Pedal  hair growth normal.  Neurologic Normal speech. Oriented to person, place, and time. Epicritic sensation to light touch grossly present bilaterally.  Dermatologic Nails well groomed and normal in appearance. No open wounds. No skin lesions.  Orthopedic: Residual weakness of the peroneal tendon noted to the right lower extremity.  Weakness noted to the active dorsiflexion eversion of the foot.  4 out of 5.  Gait examination shows slightly pes cavovarus foot structure likely due to peroneal weakness and overpowering of the posterior tibial tendon.   Radiographs: None Assessment:   1. Tendon weakness   2. Cavovarus deformity of foot, acquired, right    Plan:  Patient was evaluated and  treated and all questions answered.  Right cavovarus foot type status post weakness of the peroneal tendon with history of peroneal tendon repair -All questions and concerns were discussed with the patient in extensive detail. -Given that patient did not want to do physical therapy while undergoing in the postop period I believe she will benefit from some physical therapy at this time to increase the strength of the peroneal tendons.  She does have some strength therefore it is intact and not concerned for an acute tear.  She also does not have any traumatic event that she can recall. -Physical therapy prescription was given -Continue wearing a Tri-Lock ankle brace -If there is no improvement we will discuss more rigid bracing to address the cavus varus foot versus surgical means to address the posterior tibial tendon with lengthening.  No follow-ups on file.

## 2021-04-26 ENCOUNTER — Encounter: Payer: Self-pay | Admitting: Family Medicine

## 2021-04-26 ENCOUNTER — Ambulatory Visit (INDEPENDENT_AMBULATORY_CARE_PROVIDER_SITE_OTHER): Payer: Medicare Other | Admitting: Family Medicine

## 2021-04-26 VITALS — BP 136/71 | HR 82 | Ht 66.0 in | Wt 282.0 lb

## 2021-04-26 DIAGNOSIS — E782 Mixed hyperlipidemia: Secondary | ICD-10-CM

## 2021-04-26 DIAGNOSIS — D72829 Elevated white blood cell count, unspecified: Secondary | ICD-10-CM | POA: Diagnosis not present

## 2021-04-26 DIAGNOSIS — I10 Essential (primary) hypertension: Secondary | ICD-10-CM | POA: Diagnosis not present

## 2021-04-26 DIAGNOSIS — E785 Hyperlipidemia, unspecified: Secondary | ICD-10-CM | POA: Diagnosis not present

## 2021-04-26 DIAGNOSIS — E1169 Type 2 diabetes mellitus with other specified complication: Secondary | ICD-10-CM

## 2021-04-26 LAB — BAYER DCA HB A1C WAIVED: HB A1C (BAYER DCA - WAIVED): 6.4 % — ABNORMAL HIGH (ref 4.8–5.6)

## 2021-04-26 NOTE — Progress Notes (Signed)
BP 136/71    Pulse 82    Ht 5' 6"  (1.676 m)    Wt 282 lb (127.9 kg)    SpO2 98%    BMI 45.52 kg/m    Subjective:   Patient ID: Tiffany Brown, female    DOB: 11/02/1945, 75 y.o.   MRN: [id number]  HPI: Tiffany Brown is a 75 y.o. female presenting on 04/26/2021 for Medical Management of Chronic Issues, Diabetes, Hyperlipidemia, and Hypertension   HPI Type 2 diabetes mellitus Patient comes in today for recheck of his diabetes. Patient has been currently taking no medication, has been diet controlled, A1c 6.4. Patient is currently on an ACE inhibitor/ARB. Patient has seen an ophthalmologist this year. Patient denies any issues with their feet. The symptom started onset as an adult hypertension and hyperlipidemia ARE RELATED TO DM   Hypertension Patient is currently on losartan and amlodipine and furosemide, and their blood pressure today is 136/71. Patient denies any lightheadedness or dizziness. Patient denies headaches, blurred vision, chest pains, shortness of breath, or weakness. Denies any side effects from medication and is content with current medication.   Hyperlipidemia Patient is coming in for recheck of his hyperlipidemia. The patient is currently taking pravastatin. They deny any issues with myalgias or history of liver damage from it. They deny any focal numbness or weakness or chest pain.   Patient has chronic both of her legs and her back and sees a specialist for these.  Relevant past medical, surgical, family and social history reviewed and updated as indicated. Interim medical history since our last visit reviewed. Allergies and medications reviewed and updated.  Review of Systems  Constitutional:  Negative for chills and fever.  Eyes:  Negative for visual disturbance.  Respiratory:  Negative for chest tightness and shortness of breath.   Cardiovascular:  Negative for chest pain and leg swelling.  Genitourinary:  Negative for difficulty urinating and dysuria.   Musculoskeletal:  Positive for arthralgias, back pain and gait problem.  Skin:  Negative for rash.  Neurological:  Negative for dizziness, light-headedness and headaches.  Psychiatric/Behavioral:  Negative for agitation and behavioral problems.   All other systems reviewed and are negative.  Per HPI unless specifically indicated above   Allergies as of 04/26/2021       Reactions   Other         Medication List        Accurate as of April 26, 2021  2:38 PM. If you have any questions, ask your nurse or doctor.          STOP taking these medications    HYDROcodone-acetaminophen 5-325 MG tablet Commonly known as: Norco Stopped by: Fransisca Kaufmann Keyonte [place], MD       TAKE these medications    amLODipine 5 MG tablet Commonly known as: NORVASC Take 1 tablet (5 mg total) by mouth daily.   DULoxetine 60 MG capsule Commonly known as: CYMBALTA Take 2 capsules (120 mg total) by mouth daily.   fluticasone 50 MCG/ACT nasal spray Commonly known as: FLONASE Place 2 sprays into the nose daily as needed (allergies).   furosemide 40 MG tablet Commonly known as: LASIX Take 1 tablet (40 mg total) by mouth 2 (two) times daily as needed.   gabapentin 300 MG capsule Commonly known as: NEURONTIN TAKE 1 CAPSULE BY MOUTH THREE TIMES A DAY   ibuprofen 800 MG tablet Commonly known as: ADVIL TAKE 1 TABLET BY MOUTH EVERY 6 HOURS AS NEEDED   losartan  100 MG tablet Commonly known as: COZAAR Take 1 tablet (100 mg total) by mouth daily.   meloxicam 7.5 MG tablet Commonly known as: MOBIC TAKE 1 TABLET BY MOUTH EVERY DAY   methocarbamol 750 MG tablet Commonly known as: ROBAXIN Take 750 mg by mouth 2 (two) times daily.   omeprazole 40 MG capsule Commonly known as: PRILOSEC Take 40 mg by mouth every morning.   oxyCODONE-acetaminophen 5-325 MG tablet Commonly known as: Percocet Take 1 tablet by mouth every 4 (four) hours as needed for severe pain.   pravastatin 80 MG  tablet Commonly known as: PRAVACHOL Take 1 tablet (80 mg total) by mouth daily.   traMADol 100 MG 24 hr tablet Commonly known as: ULTRAM-ER Take 100 mg by mouth daily.         Objective:   BP 136/71    Pulse 82    Ht 5' 6"  (1.676 m)    Wt 282 lb (127.9 kg)    SpO2 98%    BMI 45.52 kg/m   Wt Readings from Last 3 Encounters:  04/26/21 282 lb (127.9 kg)  01/25/21 270 lb (122.5 kg)  10/25/20 263 lb (119.3 kg)    Physical Exam Vitals and nursing note reviewed.  Constitutional:      General: She is not in acute distress.    Appearance: She is well-developed. She is not diaphoretic.  Eyes:     Conjunctiva/sclera: Conjunctivae normal.  Cardiovascular:     Rate and Rhythm: Normal rate and regular rhythm.     Heart sounds: Normal heart sounds. No murmur heard. Pulmonary:     Effort: Pulmonary effort is normal. No respiratory distress.     Breath sounds: Normal breath sounds. No wheezing.  Musculoskeletal:        General: Swelling (2+ edema bilateral lower extremities) present. Normal range of motion.  Skin:    General: Skin is warm and dry.     Findings: No rash.  Neurological:     Mental Status: She is alert and oriented to person, place, and time.     Coordination: Coordination normal.  Psychiatric:        Behavior: Behavior normal.      Assessment & Plan:   Problem List Items Addressed This Visit       Cardiovascular and Mediastinum   Essential hypertension, benign   Relevant Orders   CBC with Differential/Platelet   Lipid panel   CMP14+EGFR   Bayer DCA Hb A1c Waived     Endocrine   DM type 2 with diabetic dyslipidemia ([place]) - Primary   Relevant Orders   CBC with Differential/Platelet   Lipid panel   CMP14+EGFR   Bayer DCA Hb A1c Waived     Other   Hyperlipidemia   Relevant Orders   CBC with Differential/Platelet   Lipid panel   CMP14+EGFR   Bayer DCA Hb A1c Waived   Other Visit Diagnoses     Leukocytosis, unspecified type         A1c looks  good at 6.4.  The diabetes looks to be good, continue to follow-up with her pain management and foot doctor.  No change in medicines or treatment from [place]  Follow up plan: Return in about 3 months (around 07/25/2021), or if symptoms worsen or fail to improve, for Diabetes and hypertension and cholesterol.  Counseling provided for all of the vaccine components Orders Placed This Encounter  Procedures   CBC with Differential/Platelet   Lipid panel   CMP14+EGFR  Bayer [facility] Hb A1c [place], MD [facility] 04/26/2021, 2:38 PM

## 2021-04-27 LAB — CMP14+EGFR
ALT: 16 IU/L (ref 0–32)
AST: 22 IU/L (ref 0–40)
Albumin/Globulin Ratio: 2.3 — ABNORMAL HIGH (ref 1.2–2.2)
Albumin: 4.3 g/dL (ref 3.7–4.7)
Alkaline Phosphatase: 139 IU/L — ABNORMAL HIGH (ref 44–121)
BUN/Creatinine Ratio: 26 (ref 12–28)
BUN: 15 mg/dL (ref 8–27)
Bilirubin Total: 0.3 mg/dL (ref 0.0–1.2)
CO2: 28 mmol/L (ref 20–29)
Calcium: 9 mg/dL (ref 8.7–10.3)
Chloride: 101 mmol/L (ref 96–106)
Creatinine, Ser: 0.58 mg/dL (ref 0.57–1.00)
Globulin, Total: 1.9 g/dL (ref 1.5–4.5)
Glucose: 145 mg/dL — ABNORMAL HIGH (ref 70–99)
Potassium: 3.8 mmol/L (ref 3.5–5.2)
Sodium: 144 mmol/L (ref 134–144)
Total Protein: 6.2 g/dL (ref 6.0–8.5)
eGFR: 94 mL/min/{1.73_m2} (ref >59)

## 2021-04-27 LAB — CBC WITH DIFFERENTIAL/PLATELET
Basophils Absolute: 0 10*3/uL (ref 0.0–0.2)
Basos: 0 %
EOS (ABSOLUTE): 0.3 10*3/uL (ref 0.0–0.4)
Eos: 3 %
Hematocrit: 35 % (ref 34.0–46.6)
Hemoglobin: 11.8 g/dL (ref 11.1–15.9)
Immature Grans (Abs): 0.1 10*3/uL (ref 0.0–0.1)
Immature Granulocytes: 1 %
Lymphocytes Absolute: 2.9 10*3/uL (ref 0.7–3.1)
Lymphs: 33 %
MCH: 30.3 pg (ref 26.6–33.0)
MCHC: 33.7 g/dL (ref 31.5–35.7)
MCV: 90 fL (ref 79–97)
Monocytes Absolute: 0.6 10*3/uL (ref 0.1–0.9)
Monocytes: 7 %
Neutrophils Absolute: 4.9 10*3/uL (ref 1.4–7.0)
Neutrophils: 56 %
Platelets: 211 10*3/uL (ref 150–450)
RBC: 3.89 x10E6/uL (ref 3.77–5.28)
RDW: 13.6 % (ref 11.7–15.4)
WBC: 8.7 10*3/uL (ref 3.4–10.8)

## 2021-04-27 LAB — LIPID PANEL
Chol/HDL Ratio: 2.9 ratio (ref 0.0–4.4)
Cholesterol, Total: 141 mg/dL (ref 100–199)
HDL: 48 mg/dL (ref >39)
LDL Chol Calc (NIH): 59 mg/dL (ref 0–99)
Triglycerides: 212 mg/dL — ABNORMAL HIGH (ref 0–149)
VLDL Cholesterol Cal: 34 mg/dL (ref 5–40)

## 2021-05-01 ENCOUNTER — Other Ambulatory Visit: Payer: Self-pay

## 2021-05-01 ENCOUNTER — Ambulatory Visit: Payer: Medicare Other | Attending: Podiatry | Admitting: Physical Therapy

## 2021-05-01 DIAGNOSIS — R600 Localized edema: Secondary | ICD-10-CM

## 2021-05-01 DIAGNOSIS — M25571 Pain in right ankle and joints of right foot: Secondary | ICD-10-CM

## 2021-05-01 DIAGNOSIS — M25671 Stiffness of right ankle, not elsewhere classified: Secondary | ICD-10-CM | POA: Diagnosis not present

## 2021-05-01 NOTE — Therapy (Signed)
[facility] [place], [place], [address] Phone: [phone]   Fax:  [phone]  Physical Therapy Evaluation  Patient Details  Name: Tiffany Brown MRN: [id number] Date of Birth: 1946/01/11 Referring Provider (PT): Boneta Lucks DPM.   Encounter Date: 05/01/2021   PT End of Session - 05/01/21 1414     Visit Number 1    Number of Visits 12    Date for PT Re-Evaluation 07/30/21    Authorization Type FOTO AT LEAST EVERY 5TH VISIT.  PROGRESS NOTE AT 10TH VISIT.  KX MODIFIER AFTER 15 VISITS.    PT Start Time 0142    Activity Tolerance Patient tolerated treatment well    Behavior During Therapy WFL for tasks assessed/performed             Past Medical History:  Diagnosis Date   Abnormal glucose    Depression with anxiety    Essential hypertension, benign    Fibromyalgia    Melanoma ([place])    Near syncope    Other and unspecified hyperlipidemia     Past Surgical History:  Procedure Laterality Date   ABDOMINAL HYSTERECTOMY     BREAST SURGERY     LEFT   CHOLECYSTECTOMY     ELBOW SURGERY     X's 3 Left   IR ABLATE LIVER CRYOABLATION  02/15/2021   IR RADIOLOGIST EVAL & MGMT  02/08/2021   IR RADIOLOGIST EVAL & MGMT  03/14/2021   RIGHT DISTAL RADIUS FRACTURE     TOTAL KNEE ARTHROPLASTY     RIGHT    There were no vitals filed for this visit.    Subjective Assessment - 05/01/21 1415     Subjective COVID-19 screen performed prior to patient entering clinic.  The patient presents to the clinic today s/p right ankle peroneal tendon repair performed on 12/18/20.  She remains in a lot of pain and is wearing Crocs as she is unable to fit in regular footwear at thsi time. She has an ankle brace and I encouraged her to be compliant to wearing it.  Her pain is an 8/10 today and can rise to higher levels even with short duration walking.  She uses a Radiation protection practitioner for safety.    Pertinent History HTN, Fibromyalgia, left elbow  surgery, right TKA, h/o left knee pain and bilateral shoulder pain.    How long can you walk comfortably? Around home with a Rollator.    Patient Stated Goals Walk without pain.    Currently in Pain? Yes    Pain Score 8     Pain Location Ankle    Pain Orientation Right    Pain Descriptors / Indicators Sore;Throbbing    Pain Type Surgical pain    Pain Onset More than a month ago    Pain Frequency Constant    Aggravating Factors  Standing/walking.    Pain Relieving Factors Staying off feet.                [facility] PT Assessment - 05/01/21 0001       Assessment   Medical Diagnosis S/p Peroneal tendon repair.    Referring Provider (PT) Boneta Lucks DPM.    Onset Date/Surgical Date --   12/18/20.     Precautions   Precautions Fall    Precaution Comments Patient walks with a Rollator for safety.      Restrictions   Weight Bearing Restrictions No      Balance Screen   Has the patient fallen  in the past 6 months Yes    How many times? 2.    Has the patient had a decrease in activity level because of a fear of falling?  Yes    Is the patient reluctant to leave their home because of a fear of falling?  Yes      [place] residence      Prior Function   Level of Independence Independent      Observation/Other Assessments   Focus on Therapeutic Outcomes (FOTO)  Complete.      Observation/Other Assessments-Edema    Edema Circumferential      Circumferential Edema   Circumferential - Right Bi-Malleolar RT is 2 cms > left.      Posture/Postural Control   Posture Comments The patient's right ankle resting position is in 35 degrees of inversion.      ROM / Strength   AROM / PROM / Strength AROM      AROM   Overall AROM Comments Active right ankle eversion to -15 degrees, plantarflexion is 50 degrees and dorsiflexion is -10 degrees from the neutral position.      Palpation   Palpation comment Tender to palpation over patient's right  lateral ankle/incisional region.      Ambulation/Gait   Gait Comments The patient is walking with an antalgic gait pattern with a Rollator with her right ankle inverted.  She is wearing Crocs on the right and a Tennis shoe on the left.                        Objective measurements completed on examination: See above findings.       [facility] Adult PT Treatment/Exercise - 05/01/21 0001       Modalities   Modalities Electrical Stimulation;Vasopneumatic      Electrical Stimulation   Electrical Stimulation Location Right lateral ankle.    Electrical Stimulation Action Pre-mod. at 80-150 Hz.    Electrical Stimulation Goals Pain;Edema      Vasopneumatic   Number Minutes Vasopneumatic  15 minutes    Vasopnuematic Location  --   Right ankle.   Vasopneumatic Pressure Low                          PT Long Term Goals - 05/01/21 1542       PT LONG TERM GOAL #1   Title Independent with a HEP.    Time 6    Period Weeks    Status New      PT LONG TERM GOAL #2   Title Increase right ankle dorsiflexion to 6- 8 degrees to normalize the patients gait pattern.    Time 6    Period Weeks    Status New      PT LONG TERM GOAL #3   Title Increase right ankle eversion to 0 to 5 degrees to normalize the patients gait pattern.    Time 6    Period Weeks    Status New      PT LONG TERM GOAL #4   Title Walk 250 feet with Rollator with right ankle pain not > 3/10.    Time 6    Period Weeks    Status New                    Plan - 05/01/21 1507     Clinical Impression Statement The patient presents to OPPT  s/p right peroneal tendon repair performed on 12/18/20.  Her resting position of the right ankle is 35 degrees on inversion.  She has significant edema when measured circumferentially at the bi-malleolar region.  Her gait is antalgic in nature, also related to a great deal of left knee pain.  She was able to evert her right ankle to -15 degrees.  She  is tender to palpation over her right lateral ankle/incisional region.  Patient will benefit from skilled physical therapy intervention to address pain and deficits.    Personal Factors and Comorbidities Comorbidity 1;Other;Comorbidity 2    Comorbidities HTN, Fibromyalgia, left elbow surgery, right TKA, h/o left knee pain and bilateral shoulder pain.    Examination-Activity Limitations Other;Locomotion Level;Stand    Examination-Participation Restrictions Other;Meal Prep    Stability/Clinical Decision Making Evolving/Moderate complexity    Clinical Decision Making Low    Rehab Potential Good    PT Frequency 2x / week    PT Duration 6 weeks    PT Treatment/Interventions ADLs/Self Care Home Management;Cryotherapy;Electrical Stimulation;Iontophoresis 4mg /ml Dexamethasone;Moist Heat;Ultrasound;Neuromuscular re-education;Therapeutic exercise;Therapeutic activities;Functional mobility training;Gait training;Patient/family education;Manual techniques;Passive range of motion;Vasopneumatic Device    PT Next Visit Plan Right ankle PROM, seated Rockerboard and BAPS board, STW/M and modalities as needed.    Consulted and Agree with Plan of Care Patient             Patient will benefit from skilled therapeutic intervention in order to improve the following deficits and impairments:  Pain, Abnormal gait, Difficulty walking, Decreased activity tolerance, Decreased mobility, Increased edema, Decreased range of motion  Visit Diagnosis: Pain in right ankle and joints of right foot - Plan: PT plan of care cert/re-cert  Localized edema - Plan: PT plan of care cert/re-cert  Stiffness of right ankle, not elsewhere classified - Plan: PT plan of care cert/re-cert     Problem List Patient Active Problem List   Diagnosis Date Noted   Stasis dermatitis of both legs 12/24/2018   Leg pain, bilateral 12/24/2018   Degenerative disc disease, lumbar 12/24/2018   Fibromyalgia 12/24/2018   Vitamin D deficiency  12/24/2018   Class 3 severe obesity with serious comorbidity and body mass index (BMI) of 40.0 to 44.9 in adult ([place]) 07/23/2017   Closed fracture of right inferior pubic ramus ([place]) 11/18/2016   Gastroesophageal reflux disease 06/06/2016   Recurrent major depressive disorder, in full remission ([place]) 06/06/2016   DM type 2 with diabetic dyslipidemia ([place]) 03/06/2016   Edema 09/14/2013   Precordial pain 08/20/2011   Essential hypertension, benign    Hyperlipidemia     Vista Sawatzky, [place], PT 05/01/2021, 3:46 PM  [facility] [address] [place], [place], [address] Phone: [phone]   Fax:  [phone]  Name: Tiffany Brown MRN: [id number] Date of Birth: [birth year]

## 2021-05-03 ENCOUNTER — Other Ambulatory Visit: Payer: Self-pay

## 2021-05-03 ENCOUNTER — Ambulatory Visit: Payer: Medicare Other

## 2021-05-03 DIAGNOSIS — R600 Localized edema: Secondary | ICD-10-CM | POA: Diagnosis not present

## 2021-05-03 DIAGNOSIS — M25671 Stiffness of right ankle, not elsewhere classified: Secondary | ICD-10-CM

## 2021-05-03 DIAGNOSIS — M25571 Pain in right ankle and joints of right foot: Secondary | ICD-10-CM | POA: Diagnosis not present

## 2021-05-03 NOTE — Therapy (Signed)
[facility] [place], [place], [address] Phone: [phone]   Fax:  [phone]  Physical Therapy Treatment  Patient Details  Name: Tiffany Brown MRN: [id number] Date of Birth: 08-27-1945 Referring Provider (PT): Boneta Lucks DPM.   Encounter Date: 05/03/2021   PT End of Session - 05/03/21 1354     Visit Number 2    Number of Visits 12    Date for PT Re-Evaluation 07/30/21    Authorization Type FOTO AT LEAST EVERY 5TH VISIT.  PROGRESS NOTE AT 10TH VISIT.  KX MODIFIER AFTER 15 VISITS.    PT Start Time 1347    PT Stop Time 1453    PT Time Calculation (min) 66 min    Activity Tolerance Patient tolerated treatment well    Behavior During Therapy WFL for tasks assessed/performed             Past Medical History:  Diagnosis Date   Abnormal glucose    Depression with anxiety    Essential hypertension, benign    Fibromyalgia    Melanoma ([place])    Near syncope    Other and unspecified hyperlipidemia     Past Surgical History:  Procedure Laterality Date   ABDOMINAL HYSTERECTOMY     BREAST SURGERY     LEFT   CHOLECYSTECTOMY     ELBOW SURGERY     X's 3 Left   IR ABLATE LIVER CRYOABLATION  02/15/2021   IR RADIOLOGIST EVAL & MGMT  02/08/2021   IR RADIOLOGIST EVAL & MGMT  03/14/2021   RIGHT DISTAL RADIUS FRACTURE     TOTAL KNEE ARTHROPLASTY     RIGHT    There were no vitals filed for this visit.   Subjective Assessment - 05/03/21 1349     Subjective Patient reports that the electrical stimulation and ice she had at her last appointment helped to ease up her pain for a couple hours after her last appointment . She notes that her pain is not as bad as it was at her evaluation.    Pertinent History HTN, Fibromyalgia, left elbow surgery, right TKA, h/o left knee pain and bilateral shoulder pain.    How long can you walk comfortably? Around home with a Rollator.    Patient Stated Goals Walk without pain.     Currently in Pain? Yes    Pain Score 6     Pain Location Ankle    Pain Orientation Right    Pain Type Surgical pain    Pain Onset More than a month ago                               [facility] Adult PT Treatment/Exercise - 05/03/21 0001       Exercises   Exercises Ankle      Modalities   Modalities Electrical Stimulation;Vasopneumatic      Electrical Stimulation   Electrical Stimulation Location Right lateral ankle.    Electrical Stimulation Action pre mod    Electrical Stimulation Parameters 80-150 Hz x 16 minutes    Electrical Stimulation Goals Pain;Edema      Vasopneumatic   Number Minutes Vasopneumatic  15 minutes    Vasopnuematic Location  Ankle    Vasopneumatic Pressure Low    Vasopneumatic Temperature  34      Manual Therapy   Manual Therapy Joint mobilization;Passive ROM    Joint Mobilization Subtalar grade I-III    Passive ROM  all planes with focus on inversion and eversion      Ankle Exercises: Standing   Rocker Board 3 minutes    Rocker Board Limitations Seated      Ankle Exercises: Seated   Ankle Circles/Pumps AROM;Right;20 reps;Other (comment)   pumps and circles (CW and CCW); 20 reps each   BAPS Sitting;Level 1   3 minutes   BAPS Limitations Inversion/Eversion    Other Seated Ankle Exercises Inversion and Eversion   20 reps each                         PT Long Term Goals - 05/01/21 1542       PT LONG TERM GOAL #1   Title Independent with a HEP.    Time 6    Period Weeks    Status New      PT LONG TERM GOAL #2   Title Increase right ankle dorsiflexion to 6- 8 degrees to normalize the patients gait pattern.    Time 6    Period Weeks    Status New      PT LONG TERM GOAL #3   Title Increase right ankle eversion to 0 to 5 degrees to normalize the patients gait pattern.    Time 6    Period Weeks    Status New      PT LONG TERM GOAL #4   Title Walk 250 feet with Rollator with right ankle pain not > 3/10.     Time 6    Period Weeks    Status New                   Plan - 05/03/21 1356     Clinical Impression Statement Patient was introduced to multiple new ankle interventions for improved ankle mobility. She required minimal cuing with BAPS ankle inversion and eversion for improved ankle control to facilitate slow and controlled ankle mobility. Manual therapy focused on improved ankle eversion through the use of PROM, and subtalar joint mobilizations. This was able to slightly improve her mobility. She was educated on the benefits of performing light ankle AROM in sitting or supine throughout the day. She reported that her ankle felt better upon the conclusion of treatment. She continues to require skilled physical therapy to address her remaining impairments to return to her prior level of function.    Personal Factors and Comorbidities Comorbidity 1;Other;Comorbidity 2    Comorbidities HTN, Fibromyalgia, left elbow surgery, right TKA, h/o left knee pain and bilateral shoulder pain.    Examination-Activity Limitations Other;Locomotion Level;Stand    Examination-Participation Restrictions Other;Meal Prep    Stability/Clinical Decision Making Evolving/Moderate complexity    Rehab Potential Good    PT Frequency 2x / week    PT Duration 6 weeks    PT Treatment/Interventions ADLs/Self Care Home Management;Cryotherapy;Electrical Stimulation;Iontophoresis 4mg /ml Dexamethasone;Moist Heat;Ultrasound;Neuromuscular re-education;Therapeutic exercise;Therapeutic activities;Functional mobility training;Gait training;Patient/family education;Manual techniques;Passive range of motion;Vasopneumatic Device    PT Next Visit Plan Right ankle PROM, seated Rockerboard and BAPS board, STW/M and modalities as needed.    Consulted and Agree with Plan of Care Patient             Patient will benefit from skilled therapeutic intervention in order to improve the following deficits and impairments:  Pain, Abnormal  gait, Difficulty walking, Decreased activity tolerance, Decreased mobility, Increased edema, Decreased range of motion  Visit Diagnosis: Pain in right ankle and joints of right foot  Localized edema  Stiffness of right ankle, not elsewhere classified     Problem List Patient Active Problem List   Diagnosis Date Noted   Stasis dermatitis of both legs 12/24/2018   Leg pain, bilateral 12/24/2018   Degenerative disc disease, lumbar 12/24/2018   Fibromyalgia 12/24/2018   Vitamin D deficiency 12/24/2018   Class 3 severe obesity with serious comorbidity and body mass index (BMI) of 40.0 to 44.9 in adult ([place]) 07/23/2017   Closed fracture of right inferior pubic ramus ([place]) 11/18/2016   Gastroesophageal reflux disease 06/06/2016   Recurrent major depressive disorder, in full remission ([place]) 06/06/2016   DM type 2 with diabetic dyslipidemia ([place]) 03/06/2016   Edema 09/14/2013   Precordial pain 08/20/2011   Essential hypertension, benign    Hyperlipidemia     Darlin Coco, PT 05/03/2021, 4:19 PM  [facility] [address] [place], [place], [address] Phone: [phone]   Fax:  [phone]  Name: DAWANDA MAPEL MRN: [id number] Date of Birth: [birth year]

## 2021-05-08 ENCOUNTER — Ambulatory Visit: Payer: Medicare Other | Admitting: *Deleted

## 2021-05-10 ENCOUNTER — Ambulatory Visit: Payer: Medicare Other | Attending: Podiatry | Admitting: *Deleted

## 2021-05-10 ENCOUNTER — Other Ambulatory Visit: Payer: Self-pay

## 2021-05-10 DIAGNOSIS — R600 Localized edema: Secondary | ICD-10-CM | POA: Diagnosis not present

## 2021-05-10 DIAGNOSIS — M25571 Pain in right ankle and joints of right foot: Secondary | ICD-10-CM | POA: Diagnosis not present

## 2021-05-10 DIAGNOSIS — M25671 Stiffness of right ankle, not elsewhere classified: Secondary | ICD-10-CM | POA: Diagnosis not present

## 2021-05-10 DIAGNOSIS — I890 Lymphedema, not elsewhere classified: Secondary | ICD-10-CM | POA: Diagnosis not present

## 2021-05-10 NOTE — Therapy (Signed)
[facility] [place], [place], [address] Phone: [phone]   Fax:  [phone]  Physical Therapy Treatment  Patient Details  Name: Tiffany Brown MRN: [id number] Date of Birth: 18-Feb-1946 Referring Provider (PT): Boneta Lucks DPM.   Encounter Date: 05/10/2021   PT End of Session - 05/10/21 1501     Visit Number 3    Number of Visits 12    Date for PT Re-Evaluation 07/30/21    Authorization Type FOTO AT LEAST EVERY 5TH VISIT.  PROGRESS NOTE AT 10TH VISIT.  KX MODIFIER AFTER 15 VISITS.    PT Start Time 1300    PT Stop Time 1335    PT Time Calculation (min) 35 min             Past Medical History:  Diagnosis Date   Abnormal glucose    Depression with anxiety    Essential hypertension, benign    Fibromyalgia    Melanoma ([place])    Near syncope    Other and unspecified hyperlipidemia     Past Surgical History:  Procedure Laterality Date   ABDOMINAL HYSTERECTOMY     BREAST SURGERY     LEFT   CHOLECYSTECTOMY     ELBOW SURGERY     X's 3 Left   IR ABLATE LIVER CRYOABLATION  02/15/2021   IR RADIOLOGIST EVAL & MGMT  02/08/2021   IR RADIOLOGIST EVAL & MGMT  03/14/2021   RIGHT DISTAL RADIUS FRACTURE     TOTAL KNEE ARTHROPLASTY     RIGHT    There were no vitals filed for this visit.   Subjective Assessment - 05/10/21 1352     Subjective Not doing well today due to pain all over and very little sleep    Pertinent History HTN, Fibromyalgia, left elbow surgery, right TKA, h/o left knee pain and bilateral shoulder pain.    How long can you walk comfortably? Around home with a Rollator.    Patient Stated Goals Walk without pain.    Currently in Pain? Yes    Pain Score 6     Pain Location Ankle    Pain Orientation Right    Pain Descriptors / Indicators Sore    Pain Type Surgical pain    Pain Onset More than a month ago                               [facility] Adult PT Treatment/Exercise -  05/10/21 0001       Exercises   Exercises Ankle      Modalities   Modalities Electrical Stimulation;Vasopneumatic      Manual Therapy   Joint Mobilization Subtalar grade I-III    Passive ROM all planes with focus on eversion with AAROM as well      Ankle Exercises: Standing   Rocker Board 4 minutes   DF/PF EV/INV x 4 mins each   Rocker Board Limitations Seated      Ankle Exercises: Seated   Other Seated Ankle Exercises Inversion and Eversion   20 reps each                         PT Long Term Goals - 05/01/21 1542       PT LONG TERM GOAL #1   Title Independent with a HEP.    Time 6    Period Weeks    Status New  PT LONG TERM GOAL #2   Title Increase right ankle dorsiflexion to 6- 8 degrees to normalize the patients gait pattern.    Time 6    Period Weeks    Status New      PT LONG TERM GOAL #3   Title Increase right ankle eversion to 0 to 5 degrees to normalize the patients gait pattern.    Time 6    Period Weeks    Status New      PT LONG TERM GOAL #4   Title Walk 250 feet with Rollator with right ankle pain not > 3/10.    Time 6    Period Weeks    Status New                   Plan - 05/10/21 1503     Clinical Impression Statement Pt arrived today not doing well due to pain all over and not sleeping well. Rx focused on PROM and AROM for mm  activation for eversion. Pt did better with manual AAROM  and giving her a target to aim at while performing eversion. Shorter Rx today due to Pt asking to leave early due to not feeling well.    Personal Factors and Comorbidities Comorbidity 1;Other;Comorbidity 2    Comorbidities HTN, Fibromyalgia, left elbow surgery, right TKA, h/o left knee pain and bilateral shoulder pain.    Stability/Clinical Decision Making Evolving/Moderate complexity    Rehab Potential Good    PT Frequency 2x / week    PT Duration 6 weeks    PT Treatment/Interventions ADLs/Self Care Home  Management;Cryotherapy;Electrical Stimulation;Iontophoresis 4mg /ml Dexamethasone;Moist Heat;Ultrasound;Neuromuscular re-education;Therapeutic exercise;Therapeutic activities;Functional mobility training;Gait training;Patient/family education;Manual techniques;Passive range of motion;Vasopneumatic Device    PT Next Visit Plan Right ankle PROM, seated Rockerboard and BAPS board, STW/M and modalities as needed.    Consulted and Agree with Plan of Care Patient             Patient will benefit from skilled therapeutic intervention in order to improve the following deficits and impairments:  Pain, Abnormal gait, Difficulty walking, Decreased activity tolerance, Decreased mobility, Increased edema, Decreased range of motion  Visit Diagnosis: Pain in right ankle and joints of right foot  Localized edema  Stiffness of right ankle, not elsewhere classified  Lymphedema, not elsewhere classified     Problem List Patient Active Problem List   Diagnosis Date Noted   Stasis dermatitis of both legs 12/24/2018   Leg pain, bilateral 12/24/2018   Degenerative disc disease, lumbar 12/24/2018   Fibromyalgia 12/24/2018   Vitamin D deficiency 12/24/2018   Class 3 severe obesity with serious comorbidity and body mass index (BMI) of 40.0 to 44.9 in adult ([place]) 07/23/2017   Closed fracture of right inferior pubic ramus ([place]) 11/18/2016   Gastroesophageal reflux disease 06/06/2016   Recurrent major depressive disorder, in full remission ([place]) 06/06/2016   DM type 2 with diabetic dyslipidemia ([place]) 03/06/2016   Edema 09/14/2013   Precordial pain 08/20/2011   Essential hypertension, benign    Hyperlipidemia     [name],CHRIS, PTA 05/10/2021, 3:09 PM  [facility] [address] [place], [place], [address] Phone: [phone]   Fax:  [phone]  Name: Tiffany Brown MRN: [id number] Date of Birth: [birth year]

## 2021-05-14 ENCOUNTER — Ambulatory Visit: Payer: Medicare Other | Admitting: Physical Therapy

## 2021-05-14 ENCOUNTER — Encounter: Payer: Self-pay | Admitting: Physical Therapy

## 2021-05-14 ENCOUNTER — Other Ambulatory Visit: Payer: Self-pay

## 2021-05-14 DIAGNOSIS — R600 Localized edema: Secondary | ICD-10-CM | POA: Diagnosis not present

## 2021-05-14 DIAGNOSIS — M25671 Stiffness of right ankle, not elsewhere classified: Secondary | ICD-10-CM | POA: Diagnosis not present

## 2021-05-14 DIAGNOSIS — M25571 Pain in right ankle and joints of right foot: Secondary | ICD-10-CM

## 2021-05-14 DIAGNOSIS — I890 Lymphedema, not elsewhere classified: Secondary | ICD-10-CM | POA: Diagnosis not present

## 2021-05-14 NOTE — Therapy (Signed)
[facility] [place], [place], [address] Phone: [phone]   Fax:  [phone]  Physical Therapy Treatment  Patient Details  Name: Tiffany Brown MRN: [id number] Date of Birth: 23-May-1945 Referring Provider (PT): Boneta Lucks DPM.   Encounter Date: 05/14/2021   PT End of Session - 05/14/21 1341     Visit Number 4    Number of Visits 12    Date for PT Re-Evaluation 07/30/21    Authorization Type FOTO AT LEAST EVERY 5TH VISIT.  PROGRESS NOTE AT 10TH VISIT.  KX MODIFIER AFTER 15 VISITS.    PT Start Time 1347    PT Stop Time 1424    PT Time Calculation (min) 37 min    Equipment Utilized During Treatment Other (comment)   Rollator   Activity Tolerance Patient tolerated treatment well    Behavior During Therapy WFL for tasks assessed/performed             Past Medical History:  Diagnosis Date   Abnormal glucose    Depression with anxiety    Essential hypertension, benign    Fibromyalgia    Melanoma ([place])    Near syncope    Other and unspecified hyperlipidemia     Past Surgical History:  Procedure Laterality Date   ABDOMINAL HYSTERECTOMY     BREAST SURGERY     LEFT   CHOLECYSTECTOMY     ELBOW SURGERY     X's 3 Left   IR ABLATE LIVER CRYOABLATION  02/15/2021   IR RADIOLOGIST EVAL & MGMT  02/08/2021   IR RADIOLOGIST EVAL & MGMT  03/14/2021   RIGHT DISTAL RADIUS FRACTURE     TOTAL KNEE ARTHROPLASTY     RIGHT    There were no vitals filed for this visit.   Subjective Assessment - 05/14/21 1340     Subjective Reports some pain but not bad.    Pertinent History HTN, Fibromyalgia, left elbow surgery, right TKA, h/o left knee pain and bilateral shoulder pain.    How long can you walk comfortably? Around home with a Rollator.    Patient Stated Goals Walk without pain.    Currently in Pain? Yes    Pain Score 6     Pain Location Ankle    Pain Orientation Right    Pain Descriptors / Indicators Discomfort     Pain Type Surgical pain    Pain Onset More than a month ago    Pain Frequency Constant                [facility] PT Assessment - 05/14/21 0001       Assessment   Medical Diagnosis S/p Peroneal tendon repair.    Referring Provider (PT) Boneta Lucks DPM.    Onset Date/Surgical Date 12/18/20    Next MD Visit 06/08/2021      Precautions   Precautions Fall    Precaution Comments Patient walks with a Rollator for safety.      Restrictions   Weight Bearing Restrictions No                           [facility] Adult PT Treatment/Exercise - 05/14/21 0001       Modalities   Modalities Vasopneumatic      Vasopneumatic   Number Minutes Vasopneumatic  10 minutes    Vasopnuematic Location  Ankle    Vasopneumatic Pressure Low    Vasopneumatic Temperature  34  Ankle Exercises: Seated   Towel Crunch Limitations   x1 min   Heel Raises Right;20 reps    Toe Raise 20 reps    Other Seated Ankle Exercises Rockerboard x3 min DF/PF, x3 min Inv/Ev; R ankle windshield wiper x20 reps    Other Seated Ankle Exercises Dynadisc DF/PF x3 min, Inv/Ev x3 min, circles x3 min; Inversion isometric x15 reps 3 sec holds, eversion isometric x15 reps 3 sec holds                          PT Long Term Goals - 05/01/21 1542       PT LONG TERM GOAL #1   Title Independent with a HEP.    Time 6    Period Weeks    Status New      PT LONG TERM GOAL #2   Title Increase right ankle dorsiflexion to 6- 8 degrees to normalize the patients gait pattern.    Time 6    Period Weeks    Status New      PT LONG TERM GOAL #3   Title Increase right ankle eversion to 0 to 5 degrees to normalize the patients gait pattern.    Time 6    Period Weeks    Status New      PT LONG TERM GOAL #4   Title Walk 250 feet with Rollator with right ankle pain not > 3/10.    Time 6    Period Weeks    Status New                   Plan - 05/14/21 1422     Clinical Impression Statement  Patient presented in clinic with reports of moderate R ankle pain. Patient's R ankle rests in inversion upon observation. Patient limited with R ankle ROM and limited with strength as well. Patient's R ankle observed with considerable swelling of the R forefoot. Patient is still considerably tender to plantar surface of the fifth metatarsal. Normal vasopneumatic response noted following removal of the modality.    Personal Factors and Comorbidities Comorbidity 1;Other;Comorbidity 2    Comorbidities HTN, Fibromyalgia, left elbow surgery, right TKA, h/o left knee pain and bilateral shoulder pain.    Examination-Activity Limitations Other;Locomotion Level;Stand    Examination-Participation Restrictions Other;Meal Prep    Stability/Clinical Decision Making Evolving/Moderate complexity    Rehab Potential Good    PT Frequency 2x / week    PT Duration 6 weeks    PT Treatment/Interventions ADLs/Self Care Home Management;Cryotherapy;Electrical Stimulation;Iontophoresis 4mg /ml Dexamethasone;Moist Heat;Ultrasound;Neuromuscular re-education;Therapeutic exercise;Therapeutic activities;Functional mobility training;Gait training;Patient/family education;Manual techniques;Passive range of motion;Vasopneumatic Device    PT Next Visit Plan Right ankle PROM, seated Rockerboard and BAPS board, STW/M and modalities as needed.    Consulted and Agree with Plan of Care Patient             Patient will benefit from skilled therapeutic intervention in order to improve the following deficits and impairments:  Pain, Abnormal gait, Difficulty walking, Decreased activity tolerance, Decreased mobility, Increased edema, Decreased range of motion  Visit Diagnosis: Pain in right ankle and joints of right foot  Localized edema  Stiffness of right ankle, not elsewhere classified     Problem List Patient Active Problem List   Diagnosis Date Noted   Stasis dermatitis of both legs 12/24/2018   Leg pain, bilateral  12/24/2018   Degenerative disc disease, lumbar 12/24/2018   Fibromyalgia 12/24/2018   Vitamin D  deficiency 12/24/2018   Class 3 severe obesity with serious comorbidity and body mass index (BMI) of 40.0 to 44.9 in adult ([place]) 07/23/2017   Closed fracture of right inferior pubic ramus ([place]) 11/18/2016   Gastroesophageal reflux disease 06/06/2016   Recurrent major depressive disorder, in full remission ([place]) 06/06/2016   DM type 2 with diabetic dyslipidemia ([place]) 03/06/2016   Edema 09/14/2013   Precordial pain 08/20/2011   Essential hypertension, benign    Hyperlipidemia     Standley Brooking, PTA 05/14/2021, 2:37 PM  [facility] [place], [place], [address] Phone: [phone]   Fax:  [phone]  Name: Tiffany Brown MRN: [id number] Date of Birth: 1945-07-29

## 2021-05-15 DIAGNOSIS — R350 Frequency of micturition: Secondary | ICD-10-CM | POA: Diagnosis not present

## 2021-05-15 DIAGNOSIS — R1030 Lower abdominal pain, unspecified: Secondary | ICD-10-CM | POA: Diagnosis not present

## 2021-05-15 DIAGNOSIS — K5900 Constipation, unspecified: Secondary | ICD-10-CM | POA: Diagnosis not present

## 2021-05-15 DIAGNOSIS — K219 Gastro-esophageal reflux disease without esophagitis: Secondary | ICD-10-CM | POA: Diagnosis not present

## 2021-05-17 ENCOUNTER — Other Ambulatory Visit: Payer: Self-pay | Admitting: Podiatry

## 2021-05-17 ENCOUNTER — Other Ambulatory Visit: Payer: Self-pay

## 2021-05-17 ENCOUNTER — Encounter: Payer: Self-pay | Admitting: Physical Therapy

## 2021-05-17 ENCOUNTER — Ambulatory Visit: Payer: Medicare Other | Admitting: Physical Therapy

## 2021-05-17 DIAGNOSIS — M25671 Stiffness of right ankle, not elsewhere classified: Secondary | ICD-10-CM

## 2021-05-17 DIAGNOSIS — M25571 Pain in right ankle and joints of right foot: Secondary | ICD-10-CM

## 2021-05-17 DIAGNOSIS — R600 Localized edema: Secondary | ICD-10-CM

## 2021-05-17 DIAGNOSIS — I890 Lymphedema, not elsewhere classified: Secondary | ICD-10-CM | POA: Diagnosis not present

## 2021-05-17 NOTE — Therapy (Signed)
[facility] [place], [place], [address] Phone: [phone]   Fax:  [phone]  Physical Therapy Treatment  Patient Details  Name: Tiffany Brown MRN: [id number] Date of Birth: 09-01-1945 Referring Provider (PT): Boneta Lucks DPM.   Encounter Date: 05/17/2021   PT End of Session - 05/17/21 0867     Visit Number 5    Number of Visits 12    Date for PT Re-Evaluation 07/30/21    Authorization Type FOTO AT LEAST EVERY 5TH VISIT.  PROGRESS NOTE AT 10TH VISIT.  KX MODIFIER AFTER 15 VISITS.    PT Start Time 1347    PT Stop Time 1423    PT Time Calculation (min) 36 min    Equipment Utilized During Treatment Other (comment)   R brace/shoe; rollator   Activity Tolerance Patient tolerated treatment well    Behavior During Therapy WFL for tasks assessed/performed             Past Medical History:  Diagnosis Date   Abnormal glucose    Depression with anxiety    Essential hypertension, benign    Fibromyalgia    Melanoma ([place])    Near syncope    Other and unspecified hyperlipidemia     Past Surgical History:  Procedure Laterality Date   ABDOMINAL HYSTERECTOMY     BREAST SURGERY     LEFT   CHOLECYSTECTOMY     ELBOW SURGERY     X's 3 Left   IR ABLATE LIVER CRYOABLATION  02/15/2021   IR RADIOLOGIST EVAL & MGMT  02/08/2021   IR RADIOLOGIST EVAL & MGMT  03/14/2021   RIGHT DISTAL RADIUS FRACTURE     TOTAL KNEE ARTHROPLASTY     RIGHT    There were no vitals filed for this visit.   Subjective Assessment - 05/17/21 1340     Subjective Still very sore and tender.    Pertinent History HTN, Fibromyalgia, left elbow surgery, right TKA, h/o left knee pain and bilateral shoulder pain.    How long can you walk comfortably? Around home with a Rollator.    Patient Stated Goals Walk without pain.    Currently in Pain? Yes    Pain Score 6     Pain Location Ankle    Pain Orientation Right    Pain Descriptors / Indicators  Sore;Tender    Pain Type Surgical pain    Pain Onset More than a month ago    Pain Frequency Constant                [facility] PT Assessment - 05/17/21 0001       Assessment   Medical Diagnosis S/p Peroneal tendon repair.    Referring Provider (PT) Boneta Lucks DPM.    Onset Date/Surgical Date 12/18/20    Next MD Visit 06/08/2021      Precautions   Precautions Fall    Precaution Comments Patient walks with a Rollator for safety.                           [facility] Adult PT Treatment/Exercise - 05/17/21 0001       Modalities   Modalities Vasopneumatic      Vasopneumatic   Number Minutes Vasopneumatic  10 minutes    Vasopnuematic Location  Ankle    Vasopneumatic Pressure Low    Vasopneumatic Temperature  34      Ankle Exercises: Seated   ABC's 1 rep  Ankle Circles/Pumps AROM;Right;20 reps    Towel Crunch Limitations   x1 min   Heel Raises Right;20 reps    Toe Raise 20 reps    Other Seated Ankle Exercises Rockerboard x5 min DF/PF, x5 min Inv/Ev; R ankle windshield wiper x20 reps; simulation of neutral foot position on airex    Other Seated Ankle Exercises Dynadisc DF/PF x3 min, Inv/Ev x3 min, circles x3 min; resisted R ankle inversion and eversion yellow theraband x20 reps                          PT Long Term Goals - 05/01/21 1542       PT LONG TERM GOAL #1   Title Independent with a HEP.    Time 6    Period Weeks    Status New      PT LONG TERM GOAL #2   Title Increase right ankle dorsiflexion to 6- 8 degrees to normalize the patients gait pattern.    Time 6    Period Weeks    Status New      PT LONG TERM GOAL #3   Title Increase right ankle eversion to 0 to 5 degrees to normalize the patients gait pattern.    Time 6    Period Weeks    Status New      PT LONG TERM GOAL #4   Title Walk 250 feet with Rollator with right ankle pain not > 3/10.    Time 6    Period Weeks    Status New                   Plan -  05/17/21 1422     Clinical Impression Statement Patient presented in clinic with reports of continued soreness and tenderness of the R ankle/foot; Patient progressed through light ROM and strengthening exercises/ A simulation technique utilized for R foot so that patient could feel what proper ankle posture was. Patient encouraged to try to maintain proper foot position. Normal vasopneumatic response noted following removal of the modality.    Personal Factors and Comorbidities Comorbidity 1;Other;Comorbidity 2    Comorbidities HTN, Fibromyalgia, left elbow surgery, right TKA, h/o left knee pain and bilateral shoulder pain.    Examination-Activity Limitations Other;Locomotion Level;Stand    Examination-Participation Restrictions Other;Meal Prep    Stability/Clinical Decision Making Evolving/Moderate complexity    Rehab Potential Good    PT Frequency 2x / week    PT Duration 6 weeks    PT Treatment/Interventions ADLs/Self Care Home Management;Cryotherapy;Electrical Stimulation;Iontophoresis 4mg /ml Dexamethasone;Moist Heat;Ultrasound;Neuromuscular re-education;Therapeutic exercise;Therapeutic activities;Functional mobility training;Gait training;Patient/family education;Manual techniques;Passive range of motion;Vasopneumatic Device    PT Next Visit Plan Right ankle PROM, seated Rockerboard and BAPS board, STW/M and modalities as needed.    Consulted and Agree with Plan of Care Patient             Patient will benefit from skilled therapeutic intervention in order to improve the following deficits and impairments:  Pain, Abnormal gait, Difficulty walking, Decreased activity tolerance, Decreased mobility, Increased edema, Decreased range of motion  Visit Diagnosis: Pain in right ankle and joints of right foot  Localized edema  Stiffness of right ankle, not elsewhere classified     Problem List Patient Active Problem List   Diagnosis Date Noted   Stasis dermatitis of both legs  12/24/2018   Leg pain, bilateral 12/24/2018   Degenerative disc disease, lumbar 12/24/2018   Fibromyalgia 12/24/2018   Vitamin D  deficiency 12/24/2018   Class 3 severe obesity with serious comorbidity and body mass index (BMI) of 40.0 to 44.9 in adult ([place]) 07/23/2017   Closed fracture of right inferior pubic ramus ([place]) 11/18/2016   Gastroesophageal reflux disease 06/06/2016   Recurrent major depressive disorder, in full remission ([place]) 06/06/2016   DM type 2 with diabetic dyslipidemia ([place]) 03/06/2016   Edema 09/14/2013   Precordial pain 08/20/2011   Essential hypertension, benign    Hyperlipidemia     Standley Brooking, PTA 05/17/2021, 2:26 PM  [facility] [address] [place], [place], [address] Phone: [phone]   Fax:  [phone]  Name: BRITTIN BELNAP MRN: [id number] Date of Birth: 22-Dec-1945

## 2021-05-21 ENCOUNTER — Other Ambulatory Visit: Payer: Self-pay

## 2021-05-21 ENCOUNTER — Ambulatory Visit: Payer: Medicare Other

## 2021-05-21 DIAGNOSIS — M25671 Stiffness of right ankle, not elsewhere classified: Secondary | ICD-10-CM | POA: Diagnosis not present

## 2021-05-21 DIAGNOSIS — I890 Lymphedema, not elsewhere classified: Secondary | ICD-10-CM | POA: Diagnosis not present

## 2021-05-21 DIAGNOSIS — R600 Localized edema: Secondary | ICD-10-CM | POA: Diagnosis not present

## 2021-05-21 DIAGNOSIS — M25571 Pain in right ankle and joints of right foot: Secondary | ICD-10-CM | POA: Diagnosis not present

## 2021-05-21 NOTE — Therapy (Signed)
**Note De-Identified Tiffany Obfuscation** [facility] [place], [place], [address] Phone: [phone]   Fax:  [phone]  Physical Therapy Treatment  Patient Details  Name: Tiffany Brown MRN: [id number] Date of Birth: [birth year] Referring Provider (PT): Boneta Lucks DPM.   Encounter Date: 05/21/2021   PT End of Session - 05/21/21 1349     Visit Number 6    Number of Visits 12    Date for PT Re-Evaluation 07/30/21    Authorization Type FOTO AT LEAST EVERY 5TH VISIT.  PROGRESS NOTE AT 10TH VISIT.  KX MODIFIER AFTER 15 VISITS.    PT Start Time 1345    PT Stop Time 1443    PT Time Calculation (min) 58 min    Equipment Utilized During Treatment Other (comment)   R brace/shoe; rollator   Activity Tolerance Patient tolerated treatment well    Behavior During Therapy WFL for tasks assessed/performed             Past Medical History:  Diagnosis Date   Abnormal glucose    Depression with anxiety    Essential hypertension, benign    Fibromyalgia    Melanoma ([place])    Near syncope    Other and unspecified hyperlipidemia     Past Surgical History:  Procedure Laterality Date   ABDOMINAL HYSTERECTOMY     BREAST SURGERY     LEFT   CHOLECYSTECTOMY     ELBOW SURGERY     X's 3 Left   IR ABLATE LIVER CRYOABLATION  02/15/2021   IR RADIOLOGIST EVAL & MGMT  02/08/2021   IR RADIOLOGIST EVAL & MGMT  03/14/2021   RIGHT DISTAL RADIUS FRACTURE     TOTAL KNEE ARTHROPLASTY     RIGHT    There were no vitals filed for this visit.   Subjective Assessment - 05/21/21 1348     Subjective Pt arrives for today's treamtent session reporting 6/10 right ankle pain.    Pertinent History HTN, Fibromyalgia, left elbow surgery, right TKA, h/o left knee pain and bilateral shoulder pain.    How long can you walk comfortably? Around home with a Rollator.    Patient Stated Goals Walk without pain.    Currently in Pain? Yes    Pain Score 6     Pain Location Ankle    Pain  Orientation Right    Pain Onset More than a month ago                               [facility] Adult PT Treatment/Exercise - 05/21/21 0001       Exercises   Exercises Ankle      Modalities   Modalities Vasopneumatic;Electrical Stimulation      Electrical Stimulation   Electrical Stimulation Location Right lateral ankle    Electrical Stimulation Action pre mod    Electrical Stimulation Parameters 80-150 Hz x 15 mins    Electrical Stimulation Goals Pain;Edema      Vasopneumatic   Number Minutes Vasopneumatic  15 minutes    Vasopnuematic Location  Ankle    Vasopneumatic Pressure Low    Vasopneumatic Temperature  34      Ankle Exercises: Seated   ABC's 1 rep    Ankle Circles/Pumps AROM;Right;20 reps    Towel Crunch Limitations   2 mins   Heel Raises Right;20 reps    Toe Raise 20 reps    Other Seated Ankle Exercises Rockerboard x5 min DF/PF,  x5 min Inv/Ev; R ankle windshield wiper x20 reps;    Other Seated Ankle Exercises Dynadisc DF/PF x3 min, Inv/Ev x3 min, circles x3 min                          PT Long Term Goals - 05/01/21 1542       PT LONG TERM GOAL #1   Title Independent with a HEP.    Time 6    Period Weeks    Status New      PT LONG TERM GOAL #2   Title Increase right ankle dorsiflexion to 6- 8 degrees to normalize the patients gait pattern.    Time 6    Period Weeks    Status New      PT LONG TERM GOAL #3   Title Increase right ankle eversion to 0 to 5 degrees to normalize the patients gait pattern.    Time 6    Period Weeks    Status New      PT LONG TERM GOAL #4   Title Walk 250 feet with Rollator with right ankle pain not > 3/10.    Time 6    Period Weeks    Status New                   Plan - 05/21/21 1349     Clinical Impression Statement Pt arrives for today's treatment session reporting 6/10 right ankle pain.  Pt requiring cues to keep knee stabile with rockerboard and dynadisc exercises.  Pt  encouraged to maintain proper foot position with poor to average carryover.  Due to increased pain estim placed on right lateral ankle to decrease pain and edema.  Normal responses to vaso and estim noted.  Pt reported 4/10 right ankle pain at completion of today's treatment session.    Personal Factors and Comorbidities Comorbidity 1;Other;Comorbidity 2    Comorbidities HTN, Fibromyalgia, left elbow surgery, right TKA, h/o left knee pain and bilateral shoulder pain.    Examination-Activity Limitations Other;Locomotion Level;Stand    Examination-Participation Restrictions Other;Meal Prep    Stability/Clinical Decision Making Evolving/Moderate complexity    Rehab Potential Good    PT Frequency 2x / week    PT Duration 6 weeks    PT Treatment/Interventions ADLs/Self Care Home Management;Cryotherapy;Electrical Stimulation;Iontophoresis 4mg /ml Dexamethasone;Moist Heat;Ultrasound;Neuromuscular re-education;Therapeutic exercise;Therapeutic activities;Functional mobility training;Gait training;Patient/family education;Manual techniques;Passive range of motion;Vasopneumatic Device    PT Next Visit Plan Right ankle PROM, seated Rockerboard and BAPS board, STW/M and modalities as needed.    Consulted and Agree with Plan of Care Patient             Patient will benefit from skilled therapeutic intervention in order to improve the following deficits and impairments:  Pain, Abnormal gait, Difficulty walking, Decreased activity tolerance, Decreased mobility, Increased edema, Decreased range of motion  Visit Diagnosis: Pain in right ankle and joints of right foot  Localized edema  Stiffness of right ankle, not elsewhere classified     Problem List Patient Active Problem List   Diagnosis Date Noted   Stasis dermatitis of both legs 12/24/2018   Leg pain, bilateral 12/24/2018   Degenerative disc disease, lumbar 12/24/2018   Fibromyalgia 12/24/2018   Vitamin D deficiency 12/24/2018   Class 3  severe obesity with serious comorbidity and body mass index (BMI) of 40.0 to 44.9 in adult ([place]) 07/23/2017   Closed fracture of right inferior pubic ramus ([place]) 11/18/2016   Gastroesophageal reflux  disease 06/06/2016   Recurrent major depressive disorder, in full remission ([place]) 06/06/2016   DM type 2 with diabetic dyslipidemia ([place]) 03/06/2016   Edema 09/14/2013   Precordial pain 08/20/2011   Essential hypertension, benign    Hyperlipidemia     Kathrynn Ducking, PTA 05/21/2021, 2:50 PM  [facility] [address] [place], [place], [address] Phone: [phone]   Fax:  [phone]  Name: Tiffany Brown MRN: [id number] Date of Birth: 01/10/1946

## 2021-05-24 ENCOUNTER — Other Ambulatory Visit: Payer: Self-pay

## 2021-05-24 ENCOUNTER — Ambulatory Visit: Payer: Medicare Other

## 2021-05-24 DIAGNOSIS — M25571 Pain in right ankle and joints of right foot: Secondary | ICD-10-CM

## 2021-05-24 DIAGNOSIS — M25671 Stiffness of right ankle, not elsewhere classified: Secondary | ICD-10-CM | POA: Diagnosis not present

## 2021-05-24 DIAGNOSIS — R600 Localized edema: Secondary | ICD-10-CM

## 2021-05-24 DIAGNOSIS — I890 Lymphedema, not elsewhere classified: Secondary | ICD-10-CM | POA: Diagnosis not present

## 2021-05-24 NOTE — Therapy (Signed)
[facility] [place], [place], [address] Phone: [phone]   Fax:  [phone]  Physical Therapy Treatment  Patient Details  Name: Tiffany Brown MRN: [id number] Date of Birth: 1945/09/12 Referring Provider (PT): Boneta Lucks DPM.   Encounter Date: 05/24/2021   PT End of Session - 05/24/21 1400     Visit Number 7    Number of Visits 12    Date for PT Re-Evaluation 07/30/21    Authorization Type FOTO AT LEAST EVERY 5TH VISIT.  PROGRESS NOTE AT 10TH VISIT.  KX MODIFIER AFTER 15 VISITS.    PT Start Time 1345    PT Stop Time 1435    PT Time Calculation (min) 50 min    Equipment Utilized During Treatment Other (comment)   R brace/shoe; rollator   Activity Tolerance Patient tolerated treatment well    Behavior During Therapy WFL for tasks assessed/performed             Past Medical History:  Diagnosis Date   Abnormal glucose    Depression with anxiety    Essential hypertension, benign    Fibromyalgia    Melanoma ([place])    Near syncope    Other and unspecified hyperlipidemia     Past Surgical History:  Procedure Laterality Date   ABDOMINAL HYSTERECTOMY     BREAST SURGERY     LEFT   CHOLECYSTECTOMY     ELBOW SURGERY     X's 3 Left   IR ABLATE LIVER CRYOABLATION  02/15/2021   IR RADIOLOGIST EVAL & MGMT  02/08/2021   IR RADIOLOGIST EVAL & MGMT  03/14/2021   RIGHT DISTAL RADIUS FRACTURE     TOTAL KNEE ARTHROPLASTY     RIGHT    There were no vitals filed for this visit.   Subjective Assessment - 05/24/21 1349     Subjective Pt arrives for today's treatment session reporting 3/10 right ankle pain and 6/10 left knee pain.    Pertinent History HTN, Fibromyalgia, left elbow surgery, right TKA, h/o left knee pain and bilateral shoulder pain.    How long can you walk comfortably? Around home with a Rollator.    Patient Stated Goals Walk without pain.    Currently in Pain? Yes    Pain Score 3     Pain Location  Ankle    Pain Onset More than a month ago                               [facility] Adult PT Treatment/Exercise - 05/24/21 0001       Modalities   Modalities Vasopneumatic;Electrical Stimulation      Electrical Stimulation   Electrical Stimulation Location Right lateral ankle    Electrical Stimulation Action Pre-mod    Electrical Stimulation Parameters 80-150 Hz    Electrical Stimulation Goals Pain;Edema      Vasopneumatic   Number Minutes Vasopneumatic  15 minutes    Vasopnuematic Location  Ankle    Vasopneumatic Pressure Low    Vasopneumatic Temperature  34      Ankle Exercises: Aerobic   Nustep Lvl 1 x 10 mins      Ankle Exercises: Seated   ABC's 1 rep    Ankle Circles/Pumps AROM;Right;20 reps    Heel Raises Right;20 reps    Toe Raise 20 reps    Other Seated Ankle Exercises Rockerboard x5 min DF/PF, x5 min Inv/Ev; R ankle windshield wiper x20  reps;    Other Seated Ankle Exercises Dynadisc DF/PF x3 min, Inv/Ev x3 min, circles x3 min                          PT Long Term Goals - 05/01/21 1542       PT LONG TERM GOAL #1   Title Independent with a HEP.    Time 6    Period Weeks    Status New      PT LONG TERM GOAL #2   Title Increase right ankle dorsiflexion to 6- 8 degrees to normalize the patients gait pattern.    Time 6    Period Weeks    Status New      PT LONG TERM GOAL #3   Title Increase right ankle eversion to 0 to 5 degrees to normalize the patients gait pattern.    Time 6    Period Weeks    Status New      PT LONG TERM GOAL #4   Title Walk 250 feet with Rollator with right ankle pain not > 3/10.    Time 6    Period Weeks    Status New                   Plan - 05/24/21 1400     Clinical Impression Statement Pt arrives for today's treatment session reporting 3/10 right ankle pain and 6/10 left knee pain.  Pt states that she is more limited by her knee at this time than her ankle.  Pt states that she is  performing her HEP at home as instructed and feels that her ankle is getting better.  Pt requiring min cues to not rush through ROM with rockerboard and dynadisc activities.  Pt reported 2/10 right ankle pain at completion of today's treatment session.    Personal Factors and Comorbidities Comorbidity 1;Other;Comorbidity 2    Comorbidities HTN, Fibromyalgia, left elbow surgery, right TKA, h/o left knee pain and bilateral shoulder pain.    Examination-Activity Limitations Other;Locomotion Level;Stand    Examination-Participation Restrictions Other;Meal Prep    Stability/Clinical Decision Making Evolving/Moderate complexity    Rehab Potential Good    PT Frequency 2x / week    PT Duration 6 weeks    PT Treatment/Interventions ADLs/Self Care Home Management;Cryotherapy;Electrical Stimulation;Iontophoresis 4mg /ml Dexamethasone;Moist Heat;Ultrasound;Neuromuscular re-education;Therapeutic exercise;Therapeutic activities;Functional mobility training;Gait training;Patient/family education;Manual techniques;Passive range of motion;Vasopneumatic Device    PT Next Visit Plan Right ankle PROM, seated Rockerboard and BAPS board, STW/M and modalities as needed.    Consulted and Agree with Plan of Care Patient             Patient will benefit from skilled therapeutic intervention in order to improve the following deficits and impairments:  Pain, Abnormal gait, Difficulty walking, Decreased activity tolerance, Decreased mobility, Increased edema, Decreased range of motion  Visit Diagnosis: Pain in right ankle and joints of right foot  Localized edema  Stiffness of right ankle, not elsewhere classified     Problem List Patient Active Problem List   Diagnosis Date Noted   Stasis dermatitis of both legs 12/24/2018   Leg pain, bilateral 12/24/2018   Degenerative disc disease, lumbar 12/24/2018   Fibromyalgia 12/24/2018   Vitamin D deficiency 12/24/2018   Class 3 severe obesity with serious  comorbidity and body mass index (BMI) of 40.0 to 44.9 in adult ([place]) 07/23/2017   Closed fracture of right inferior pubic ramus ([place]) 11/18/2016   Gastroesophageal reflux  disease 06/06/2016   Recurrent major depressive disorder, in full remission ([place]) 06/06/2016   DM type 2 with diabetic dyslipidemia ([place]) 03/06/2016   Edema 09/14/2013   Precordial pain 08/20/2011   Essential hypertension, benign    Hyperlipidemia     Kathrynn Ducking, PTA 05/24/2021, 2:42 PM  [facility] [address] [place], [place], [address] Phone: [phone]   Fax:  [phone]  Name: Tiffany Brown MRN: [id number] Date of Birth: 10/28/1945

## 2021-05-28 ENCOUNTER — Ambulatory Visit: Payer: Medicare Other | Admitting: Physical Therapy

## 2021-05-28 ENCOUNTER — Other Ambulatory Visit: Payer: Self-pay

## 2021-05-28 DIAGNOSIS — M25671 Stiffness of right ankle, not elsewhere classified: Secondary | ICD-10-CM

## 2021-05-28 DIAGNOSIS — R600 Localized edema: Secondary | ICD-10-CM | POA: Diagnosis not present

## 2021-05-28 DIAGNOSIS — I890 Lymphedema, not elsewhere classified: Secondary | ICD-10-CM | POA: Diagnosis not present

## 2021-05-28 DIAGNOSIS — M25571 Pain in right ankle and joints of right foot: Secondary | ICD-10-CM | POA: Diagnosis not present

## 2021-05-28 NOTE — Therapy (Signed)
[facility] [place], [place], [address] Phone: [phone]   Fax:  [phone]  Physical Therapy Treatment  Patient Details  Name: Tiffany Brown MRN: [id number] Date of Birth: 10-09-1945 Referring Provider (PT): Boneta Lucks DPM.   Encounter Date: 05/28/2021   PT End of Session - 05/28/21 1033     Visit Number 8    Number of Visits 12    Date for PT Re-Evaluation 07/30/21    Authorization Type FOTO AT LEAST EVERY 5TH VISIT.  PROGRESS NOTE AT 10TH VISIT.  KX MODIFIER AFTER 15 VISITS.    PT Start Time 1030    PT Stop Time 1114    PT Time Calculation (min) 44 min    Behavior During Therapy WFL for tasks assessed/performed             Past Medical History:  Diagnosis Date   Abnormal glucose    Depression with anxiety    Essential hypertension, benign    Fibromyalgia    Melanoma ([place])    Near syncope    Other and unspecified hyperlipidemia     Past Surgical History:  Procedure Laterality Date   ABDOMINAL HYSTERECTOMY     BREAST SURGERY     LEFT   CHOLECYSTECTOMY     ELBOW SURGERY     X's 3 Left   IR ABLATE LIVER CRYOABLATION  02/15/2021   IR RADIOLOGIST EVAL & MGMT  02/08/2021   IR RADIOLOGIST EVAL & MGMT  03/14/2021   RIGHT DISTAL RADIUS FRACTURE     TOTAL KNEE ARTHROPLASTY     RIGHT    There were no vitals filed for this visit.   Subjective Assessment - 05/28/21 1034     Subjective COVID-19 screen performed prior to patient entering clinic.  Doing better.    Pertinent History HTN, Fibromyalgia, left elbow surgery, right TKA, h/o left knee pain and bilateral shoulder pain.    How long can you walk comfortably? Around home with a Rollator.    Patient Stated Goals Walk without pain.    Currently in Pain? Yes    Pain Score 2     Pain Location Ankle    Pain Orientation Right    Pain Descriptors / Indicators Sore;Tender    Pain Type Surgical pain    Pain Onset More than a month ago                                [facility] Adult PT Treatment/Exercise - 05/28/21 0001       Exercises   Exercises Ankle      Modalities   Modalities Vasopneumatic      Vasopneumatic   Number Minutes Vasopneumatic  15 minutes    Vasopnuematic Location  --   Right ankle.   Vasopneumatic Pressure Low      Manual Therapy   Manual Therapy Passive ROM    Passive ROM PROM into right ankle dorsiflexion and eversion with LLLDS technique utilized x 17 minutes.      Ankle Exercises: Seated   Other Seated Ankle Exercises Seated Rockerboard x 4 minutes into DF/PF and 2 minutes into INV/EVER.                          PT Long Term Goals - 05/01/21 1542       PT LONG TERM GOAL #1   Title Independent with a  HEP.    Time 6    Period Weeks    Status New      PT LONG TERM GOAL #2   Title Increase right ankle dorsiflexion to 6- 8 degrees to normalize the patients gait pattern.    Time 6    Period Weeks    Status New      PT LONG TERM GOAL #3   Title Increase right ankle eversion to 0 to 5 degrees to normalize the patients gait pattern.    Time 6    Period Weeks    Status New      PT LONG TERM GOAL #4   Title Walk 250 feet with Rollator with right ankle pain not > 3/10.    Time 6    Period Weeks    Status New                   Plan - 05/28/21 1103     Clinical Impression Statement Patient very pleased that her right ankle pain has come down.  She tolerated PROM with LLLDS technique utilized without complaint.    Personal Factors and Comorbidities Comorbidity 1;Other;Comorbidity 2    Comorbidities HTN, Fibromyalgia, left elbow surgery, right TKA, h/o left knee pain and bilateral shoulder pain.    Examination-Activity Limitations Other;Locomotion Level;Stand    Examination-Participation Restrictions Other;Meal Prep    Stability/Clinical Decision Making Evolving/Moderate complexity    Rehab Potential Good    PT Frequency 2x / week    PT Duration 6  weeks    PT Treatment/Interventions ADLs/Self Care Home Management;Cryotherapy;Electrical Stimulation;Iontophoresis 4mg /ml Dexamethasone;Moist Heat;Ultrasound;Neuromuscular re-education;Therapeutic exercise;Therapeutic activities;Functional mobility training;Gait training;Patient/family education;Manual techniques;Passive range of motion;Vasopneumatic Device    PT Next Visit Plan Right ankle PROM, seated Rockerboard and BAPS board, STW/M and modalities as needed.  Nustep.    Consulted and Agree with Plan of Care Patient             Patient will benefit from skilled therapeutic intervention in order to improve the following deficits and impairments:  Pain, Abnormal gait, Difficulty walking, Decreased activity tolerance, Decreased mobility, Increased edema, Decreased range of motion  Visit Diagnosis: Pain in right ankle and joints of right foot  Localized edema  Stiffness of right ankle, not elsewhere classified     Problem List Patient Active Problem List   Diagnosis Date Noted   Stasis dermatitis of both legs 12/24/2018   Leg pain, bilateral 12/24/2018   Degenerative disc disease, lumbar 12/24/2018   Fibromyalgia 12/24/2018   Vitamin D deficiency 12/24/2018   Class 3 severe obesity with serious comorbidity and body mass index (BMI) of 40.0 to 44.9 in adult ([place]) 07/23/2017   Closed fracture of right inferior pubic ramus ([facility]) 11/18/2016   Gastroesophageal reflux disease 06/06/2016   Recurrent major depressive disorder, in full remission ([place]) 06/06/2016   DM type 2 with diabetic dyslipidemia ([place]) 03/06/2016   Edema 09/14/2013   Precordial pain 08/20/2011   Essential hypertension, benign    Hyperlipidemia     Uriel Dowding, [place], PT 05/28/2021, 11:15 AM  [facility] [address] [place], [place], [address] Phone: [phone]   Fax:  [phone]  Name: Tiffany Brown MRN: [id number] Date of Birth: 10-19-1945

## 2021-05-29 DIAGNOSIS — M47817 Spondylosis without myelopathy or radiculopathy, lumbosacral region: Secondary | ICD-10-CM | POA: Diagnosis not present

## 2021-05-29 DIAGNOSIS — M1712 Unilateral primary osteoarthritis, left knee: Secondary | ICD-10-CM | POA: Diagnosis not present

## 2021-05-29 DIAGNOSIS — G894 Chronic pain syndrome: Secondary | ICD-10-CM | POA: Diagnosis not present

## 2021-05-29 DIAGNOSIS — M5137 Other intervertebral disc degeneration, lumbosacral region: Secondary | ICD-10-CM | POA: Diagnosis not present

## 2021-05-30 DIAGNOSIS — M1712 Unilateral primary osteoarthritis, left knee: Secondary | ICD-10-CM | POA: Diagnosis not present

## 2021-05-31 ENCOUNTER — Ambulatory Visit: Payer: Medicare Other | Admitting: Physical Therapy

## 2021-05-31 ENCOUNTER — Other Ambulatory Visit: Payer: Self-pay

## 2021-05-31 ENCOUNTER — Encounter: Payer: Self-pay | Admitting: Physical Therapy

## 2021-05-31 DIAGNOSIS — M25571 Pain in right ankle and joints of right foot: Secondary | ICD-10-CM

## 2021-05-31 DIAGNOSIS — R600 Localized edema: Secondary | ICD-10-CM

## 2021-05-31 DIAGNOSIS — I890 Lymphedema, not elsewhere classified: Secondary | ICD-10-CM | POA: Diagnosis not present

## 2021-05-31 DIAGNOSIS — M25671 Stiffness of right ankle, not elsewhere classified: Secondary | ICD-10-CM | POA: Diagnosis not present

## 2021-05-31 NOTE — Therapy (Signed)
[facility] [place], [place], [address] Phone: [phone]   Fax:  [phone]  Physical Therapy Treatment  Patient Details  Name: Tiffany Brown MRN: [id number] Date of Birth: 04/07/1946 Referring Provider (PT): Boneta Lucks DPM.   Encounter Date: 05/31/2021   PT End of Session - 05/31/21 1048     Visit Number 9    Number of Visits 12    Date for PT Re-Evaluation 07/30/21    Authorization Type FOTO AT LEAST EVERY 5TH VISIT.  PROGRESS NOTE AT 10TH VISIT.  KX MODIFIER AFTER 15 VISITS.    PT Start Time 1033    PT Stop Time 1120    PT Time Calculation (min) 47 min    Equipment Utilized During Treatment Other (comment)   Rollator   Activity Tolerance Patient tolerated treatment well    Behavior During Therapy WFL for tasks assessed/performed             Past Medical History:  Diagnosis Date   Abnormal glucose    Depression with anxiety    Essential hypertension, benign    Fibromyalgia    Melanoma ([place])    Near syncope    Other and unspecified hyperlipidemia     Past Surgical History:  Procedure Laterality Date   ABDOMINAL HYSTERECTOMY     BREAST SURGERY     LEFT   CHOLECYSTECTOMY     ELBOW SURGERY     X's 3 Left   IR ABLATE LIVER CRYOABLATION  02/15/2021   IR RADIOLOGIST EVAL & MGMT  02/08/2021   IR RADIOLOGIST EVAL & MGMT  03/14/2021   RIGHT DISTAL RADIUS FRACTURE     TOTAL KNEE ARTHROPLASTY     RIGHT    There were no vitals filed for this visit.   Subjective Assessment - 05/31/21 1038     Subjective COVID-19 screen performed prior to patient entering clinic.  Doing better.    Pertinent History HTN, Fibromyalgia, left elbow surgery, right TKA, h/o left knee pain and bilateral shoulder pain.    How long can you walk comfortably? Around home with a Rollator.    Patient Stated Goals Walk without pain.    Currently in Pain? No/denies                [facility] PT Assessment - 05/31/21 0001        Assessment   Medical Diagnosis S/p Peroneal tendon repair.    Referring Provider (PT) Boneta Lucks DPM.    Onset Date/Surgical Date 12/18/20    Next MD Visit 06/08/2021      Precautions   Precautions Fall    Precaution Comments Patient walks with a Rollator for safety.                           [facility] Adult PT Treatment/Exercise - 05/31/21 0001       Modalities   Modalities Vasopneumatic      Vasopneumatic   Number Minutes Vasopneumatic  10 minutes    Vasopnuematic Location  Ankle    Vasopneumatic Pressure Low    Vasopneumatic Temperature  34      Ankle Exercises: Aerobic   Nustep L2 x11 min      Ankle Exercises: Seated   Towel Inversion/Eversion Limitations   3 min   Towel Inversion/Eversion Limitations AROM x20 reps    BAPS Sitting;Level 3;15 reps    Other Seated Ankle Exercises Seated Rockerboard x 4 minutes into DF/PF  and 2 minutes into INV/EVER.    Other Seated Ankle Exercises R ankle prostretch x5 min                          PT Long Term Goals - 05/01/21 1542       PT LONG TERM GOAL #1   Title Independent with a HEP.    Time 6    Period Weeks    Status New      PT LONG TERM GOAL #2   Title Increase right ankle dorsiflexion to 6- 8 degrees to normalize the patients gait pattern.    Time 6    Period Weeks    Status New      PT LONG TERM GOAL #3   Title Increase right ankle eversion to 0 to 5 degrees to normalize the patients gait pattern.    Time 6    Period Weeks    Status New      PT LONG TERM GOAL #4   Title Walk 250 feet with Rollator with right ankle pain not > 3/10.    Time 6    Period Weeks    Status New                   Plan - 05/31/21 1129     Clinical Impression Statement Patient presented in clinic with reports of no complaints. Patient states that she is trying to rest in more neutral foot position than inversion. Patient now wearing regular shoes as the orthopedic velcro shoe she was given had  worn down where patient rests and walks in ankle inversion. Patient states that she is to have a L TKR in the coming months which may be worsening her R ankle problem due to compensation. No complaints during therex. Normal vasopneumatic response noted following removal of the modality.    Personal Factors and Comorbidities Comorbidity 1;Other;Comorbidity 2    Comorbidities HTN, Fibromyalgia, left elbow surgery, right TKA, h/o left knee pain and bilateral shoulder pain.    Examination-Activity Limitations Other;Locomotion Level;Stand    Examination-Participation Restrictions Other;Meal Prep    Stability/Clinical Decision Making Evolving/Moderate complexity    Rehab Potential Good    PT Frequency 2x / week    PT Duration 6 weeks    PT Treatment/Interventions ADLs/Self Care Home Management;Cryotherapy;Electrical Stimulation;Iontophoresis 4mg /ml Dexamethasone;Moist Heat;Ultrasound;Neuromuscular re-education;Therapeutic exercise;Therapeutic activities;Functional mobility training;Gait training;Patient/family education;Manual techniques;Passive range of motion;Vasopneumatic Device    PT Next Visit Plan Right ankle PROM, seated Rockerboard and BAPS board, STW/M and modalities as needed.  Nustep.    Consulted and Agree with Plan of Care Patient             Patient will benefit from skilled therapeutic intervention in order to improve the following deficits and impairments:  Pain, Abnormal gait, Difficulty walking, Decreased activity tolerance, Decreased mobility, Increased edema, Decreased range of motion  Visit Diagnosis: Pain in right ankle and joints of right foot  Localized edema  Stiffness of right ankle, not elsewhere classified     Problem List Patient Active Problem List   Diagnosis Date Noted   Stasis dermatitis of both legs 12/24/2018   Leg pain, bilateral 12/24/2018   Degenerative disc disease, lumbar 12/24/2018   Fibromyalgia 12/24/2018   Vitamin D deficiency 12/24/2018    Class 3 severe obesity with serious comorbidity and body mass index (BMI) of 40.0 to 44.9 in adult ([place]) 07/23/2017   Closed fracture of right inferior pubic ramus ([place])  11/18/2016   Gastroesophageal reflux disease 06/06/2016   Recurrent major depressive disorder, in full remission ([place]) 06/06/2016   DM type 2 with diabetic dyslipidemia ([place]) 03/06/2016   Edema 09/14/2013   Precordial pain 08/20/2011   Essential hypertension, benign    Hyperlipidemia     Standley Brooking, PTA 05/31/2021, 11:32 AM  [facility] [place], [place], [address] Phone: [phone]   Fax:  [phone]  Name: Tiffany Brown MRN: [id number] Date of Birth: 11/30/1945

## 2021-06-04 ENCOUNTER — Ambulatory Visit: Payer: Medicare Other | Admitting: *Deleted

## 2021-06-04 ENCOUNTER — Other Ambulatory Visit: Payer: Self-pay

## 2021-06-04 DIAGNOSIS — M25571 Pain in right ankle and joints of right foot: Secondary | ICD-10-CM | POA: Diagnosis not present

## 2021-06-04 DIAGNOSIS — M25671 Stiffness of right ankle, not elsewhere classified: Secondary | ICD-10-CM

## 2021-06-04 DIAGNOSIS — R600 Localized edema: Secondary | ICD-10-CM | POA: Diagnosis not present

## 2021-06-04 DIAGNOSIS — I890 Lymphedema, not elsewhere classified: Secondary | ICD-10-CM | POA: Diagnosis not present

## 2021-06-04 NOTE — Therapy (Addendum)
[facility] [place], [place], [address] Phone: [phone]   Fax:  [phone]  Physical Therapy Treatment  Patient Details  Name: Tiffany Brown MRN: [id number] Date of Birth: [birth year] Referring Provider (PT): Boneta Lucks DPM.   Encounter Date: 06/04/2021   PT End of Session - 06/04/21 1506     Visit Number 10    Number of Visits 12    Date for PT Re-Evaluation 07/30/21    Authorization Type FOTO AT LEAST EVERY 5TH VISIT.  PROGRESS NOTE AT 10TH VISIT.  KX MODIFIER AFTER 15 VISITS.    PT Start Time 1300    PT Stop Time 1349    PT Time Calculation (min) 49 min             Past Medical History:  Diagnosis Date   Abnormal glucose    Depression with anxiety    Essential hypertension, benign    Fibromyalgia    Melanoma ([place])    Near syncope    Other and unspecified hyperlipidemia     Past Surgical History:  Procedure Laterality Date   ABDOMINAL HYSTERECTOMY     BREAST SURGERY     LEFT   CHOLECYSTECTOMY     ELBOW SURGERY     X's 3 Left   IR ABLATE LIVER CRYOABLATION  02/15/2021   IR RADIOLOGIST EVAL & MGMT  02/08/2021   IR RADIOLOGIST EVAL & MGMT  03/14/2021   RIGHT DISTAL RADIUS FRACTURE     TOTAL KNEE ARTHROPLASTY     RIGHT    There were no vitals filed for this visit.   Subjective Assessment - 06/04/21 1256     Subjective COVID-19 screen performed prior to patient entering clinic.    Pertinent History HTN, Fibromyalgia, left elbow surgery, right TKA, h/o left knee pain and bilateral shoulder pain.    How long can you walk comfortably? Around home with a Rollator.    Patient Stated Goals Walk without pain.    Pain Location Ankle    Pain Orientation Right    Pain Descriptors / Indicators Sore    Pain Type Surgical pain    Pain Onset More than a month ago                               [facility] Adult PT Treatment/Exercise - 06/04/21 0001       Exercises   Exercises Ankle       Modalities   Modalities Vasopneumatic      Vasopneumatic   Number Minutes Vasopneumatic  10 minutes    Vasopnuematic Location  Ankle    Vasopneumatic Pressure Low    Vasopneumatic Temperature  34      Manual Therapy   Manual Therapy Passive ROM    Passive ROM PROM/AAROM   into right ankle dorsiflexion and eversion      Ankle Exercises: Aerobic   Nustep L2 x12 min      Ankle Exercises: Seated   Other Seated Ankle Exercises Seated Rockerboard x 5 minutes into DF/PF and 3 minutes into INV/EVER.    Other Seated Ankle Exercises Dynadis  CW/CCW, circles x3 min                          PT Long Term Goals - 06/04/21 1512       PT LONG TERM GOAL #1   Title Independent with a HEP.  Time 6    Period Weeks    Status On-going    Target Date 07/19/19      PT LONG TERM GOAL #2   Title Increase right ankle dorsiflexion to 6- 8 degrees to normalize the patients gait pattern.    Time 6    Period Weeks    Status On-going      PT LONG TERM GOAL #3   Title Increase right ankle eversion to 0 to 5 degrees to normalize the patients gait pattern.    Time 6    Period Weeks    Status On-going      PT LONG TERM GOAL #4   Title Walk 250 feet with Rollator with right ankle pain not > 3/10.    Time 6    Period Weeks    Status On-going                   Plan - 06/04/21 1344     Clinical Impression Statement Pt arrived today reporting that her RT foot/ ankle is doing a little better and will f/u with MD soon.Rx focused on ROM and mm activation for RT foot. Her foot still has an inversion tendency at rest.Pt reports that they will schedule a LT TKR as soon as she looses 25#s    Personal Factors and Comorbidities Comorbidity 1;Other;Comorbidity 2    Comorbidities HTN, Fibromyalgia, left elbow surgery, right TKA, h/o left knee pain and bilateral shoulder pain.    Examination-Activity Limitations Other;Locomotion Level;Stand    Stability/Clinical Decision  Making Evolving/Moderate complexity    Rehab Potential Good    PT Frequency 2x / week    PT Treatment/Interventions ADLs/Self Care Home Management;Cryotherapy;Electrical Stimulation;Iontophoresis 4mg /ml Dexamethasone;Moist Heat;Ultrasound;Neuromuscular re-education;Therapeutic exercise;Therapeutic activities;Functional mobility training;Gait training;Patient/family education;Manual techniques;Passive range of motion;Vasopneumatic Device    PT Next Visit Plan Right ankle PROM, seated Rockerboard and BAPS board, STW/M and modalities as needed.  Nustep.    Consulted and Agree with Plan of Care Patient             Patient will benefit from skilled therapeutic intervention in order to improve the following deficits and impairments:  Pain, Abnormal gait, Difficulty walking, Decreased activity tolerance, Decreased mobility, Increased edema, Decreased range of motion  Visit Diagnosis: Pain in right ankle and joints of right foot  Localized edema  Stiffness of right ankle, not elsewhere classified     Problem List Patient Active Problem List   Diagnosis Date Noted   Stasis dermatitis of both legs 12/24/2018   Leg pain, bilateral 12/24/2018   Degenerative disc disease, lumbar 12/24/2018   Fibromyalgia 12/24/2018   Vitamin D deficiency 12/24/2018   Class 3 severe obesity with serious comorbidity and body mass index (BMI) of 40.0 to 44.9 in adult ([place]) 07/23/2017   Closed fracture of right inferior pubic ramus ([place]) 11/18/2016   Gastroesophageal reflux disease 06/06/2016   Recurrent major depressive disorder, in full remission ([place]) 06/06/2016   DM type 2 with diabetic dyslipidemia ([place]) 03/06/2016   Edema 09/14/2013   Precordial pain 08/20/2011   Essential hypertension, benign    Hyperlipidemia     Niccole Witthuhn,CHRIS, PTA 06/04/2021, 3:24 PM  [facility] [address] [place], [place], [address] Phone: [phone]   Fax:   [phone]  Name: Tiffany Brown MRN: [id number] Date of Birth: 03/22/1946  Progress Note Reporting Period 05/01/21 to 06/04/21.  See note below for Objective Data and Assessment of Progress/Goals. Goals ongoing at this time.  Mali Applegate MPT

## 2021-06-04 NOTE — Therapy (Signed)
[facility] [place], [place], [address] Phone: [phone]   Fax:  [phone]  Physical Therapy Treatment  Patient Details  Name: Tiffany Brown MRN: [id number] Date of Birth: Feb 25, 1946 Referring Provider (PT): Boneta Lucks DPM.   Encounter Date: 06/04/2021   PT End of Session - 06/04/21 1506     Visit Number 10    Number of Visits 12    Date for PT Re-Evaluation 07/30/21    Authorization Type FOTO AT LEAST EVERY 5TH VISIT.  PROGRESS NOTE AT 10TH VISIT.  KX MODIFIER AFTER 15 VISITS.    PT Start Time 1300    PT Stop Time 1349    PT Time Calculation (min) 49 min             Past Medical History:  Diagnosis Date   Abnormal glucose    Depression with anxiety    Essential hypertension, benign    Fibromyalgia    Melanoma ([place])    Near syncope    Other and unspecified hyperlipidemia     Past Surgical History:  Procedure Laterality Date   ABDOMINAL HYSTERECTOMY     BREAST SURGERY     LEFT   CHOLECYSTECTOMY     ELBOW SURGERY     X's 3 Left   IR ABLATE LIVER CRYOABLATION  02/15/2021   IR RADIOLOGIST EVAL & MGMT  02/08/2021   IR RADIOLOGIST EVAL & MGMT  03/14/2021   RIGHT DISTAL RADIUS FRACTURE     TOTAL KNEE ARTHROPLASTY     RIGHT    There were no vitals filed for this visit.   Subjective Assessment - 06/04/21 1256     Subjective COVID-19 screen performed prior to patient entering clinic.    Pertinent History HTN, Fibromyalgia, left elbow surgery, right TKA, h/o left knee pain and bilateral shoulder pain.    How long can you walk comfortably? Around home with a Rollator.    Patient Stated Goals Walk without pain.    Pain Location Ankle    Pain Orientation Right    Pain Descriptors / Indicators Sore    Pain Type Surgical pain    Pain Onset More than a month ago                               [facility] Adult PT Treatment/Exercise - 06/04/21 0001       Exercises   Exercises Ankle       Modalities   Modalities Vasopneumatic      Vasopneumatic   Number Minutes Vasopneumatic  10 minutes    Vasopnuematic Location  Ankle    Vasopneumatic Pressure Low    Vasopneumatic Temperature  34      Manual Therapy   Manual Therapy Passive ROM    Passive ROM PROM/AAROM   into right ankle dorsiflexion and eversion      Ankle Exercises: Aerobic   Nustep L2 x12 min      Ankle Exercises: Seated   Other Seated Ankle Exercises Seated Rockerboard x 5 minutes into DF/PF and 3 minutes into INV/EVER.    Other Seated Ankle Exercises Dynadis  CW/CCW, circles x3 min                          PT Long Term Goals - 06/04/21 1512       PT LONG TERM GOAL #1   Title Independent with a HEP.  Time 6    Period Weeks    Status On-going    Target Date 07/19/19      PT LONG TERM GOAL #2   Title Increase right ankle dorsiflexion to 6- 8 degrees to normalize the patients gait pattern.    Time 6    Period Weeks    Status On-going      PT LONG TERM GOAL #3   Title Increase right ankle eversion to 0 to 5 degrees to normalize the patients gait pattern.    Time 6    Period Weeks    Status On-going      PT LONG TERM GOAL #4   Title Walk 250 feet with Rollator with right ankle pain not > 3/10.    Time 6    Period Weeks    Status On-going                   Plan - 06/04/21 1344     Clinical Impression Statement Pt arrived today reporting that her RT foot/ ankle is doing a little better and will f/u with MD soon.Rx focused on ROM and mm activation for RT foot. Her foot still has an inversion tendency at rest.    Personal Factors and Comorbidities Comorbidity 1;Other;Comorbidity 2    Comorbidities HTN, Fibromyalgia, left elbow surgery, right TKA, h/o left knee pain and bilateral shoulder pain.    Examination-Activity Limitations Other;Locomotion Level;Stand    Stability/Clinical Decision Making Evolving/Moderate complexity    Rehab Potential Good    PT  Frequency 2x / week    PT Treatment/Interventions ADLs/Self Care Home Management;Cryotherapy;Electrical Stimulation;Iontophoresis 4mg /ml Dexamethasone;Moist Heat;Ultrasound;Neuromuscular re-education;Therapeutic exercise;Therapeutic activities;Functional mobility training;Gait training;Patient/family education;Manual techniques;Passive range of motion;Vasopneumatic Device    PT Next Visit Plan Right ankle PROM, seated Rockerboard and BAPS board, STW/M and modalities as needed.  Nustep.    Consulted and Agree with Plan of Care Patient             Patient will benefit from skilled therapeutic intervention in order to improve the following deficits and impairments:  Pain, Abnormal gait, Difficulty walking, Decreased activity tolerance, Decreased mobility, Increased edema, Decreased range of motion  Visit Diagnosis: Pain in right ankle and joints of right foot  Localized edema  Stiffness of right ankle, not elsewhere classified     Problem List Patient Active Problem List   Diagnosis Date Noted   Stasis dermatitis of both legs 12/24/2018   Leg pain, bilateral 12/24/2018   Degenerative disc disease, lumbar 12/24/2018   Fibromyalgia 12/24/2018   Vitamin D deficiency 12/24/2018   Class 3 severe obesity with serious comorbidity and body mass index (BMI) of 40.0 to 44.9 in adult ([place]) 07/23/2017   Closed fracture of right inferior pubic ramus ([place]) 11/18/2016   Gastroesophageal reflux disease 06/06/2016   Recurrent major depressive disorder, in full remission ([place]) 06/06/2016   DM type 2 with diabetic dyslipidemia ([place]) 03/06/2016   Edema 09/14/2013   Precordial pain 08/20/2011   Essential hypertension, benign    Hyperlipidemia     Tiffany Brown,CHRIS, PTA 06/04/2021, 3:13 PM  [facility] [address] [place], [place], [address] Phone: [phone]   Fax:  [phone]  Name: Tiffany Brown MRN: [id number] Date of Birth:  1945/12/30

## 2021-06-06 ENCOUNTER — Ambulatory Visit: Payer: Medicare Other | Admitting: Podiatry

## 2021-06-07 ENCOUNTER — Encounter: Payer: Self-pay | Admitting: Physical Therapy

## 2021-06-07 ENCOUNTER — Ambulatory Visit: Payer: Medicare Other | Attending: Podiatry | Admitting: Physical Therapy

## 2021-06-07 ENCOUNTER — Other Ambulatory Visit: Payer: Self-pay

## 2021-06-07 DIAGNOSIS — M25571 Pain in right ankle and joints of right foot: Secondary | ICD-10-CM | POA: Insufficient documentation

## 2021-06-07 DIAGNOSIS — R600 Localized edema: Secondary | ICD-10-CM | POA: Diagnosis not present

## 2021-06-07 DIAGNOSIS — M25671 Stiffness of right ankle, not elsewhere classified: Secondary | ICD-10-CM | POA: Diagnosis not present

## 2021-06-07 NOTE — Therapy (Signed)
[facility] [place], [place], [address] Phone: [phone]   Fax:  [phone]  Physical Therapy Treatment  Patient Details  Name: Tiffany Brown MRN: [id number] Date of Birth: 04/29/1946 Referring Provider (PT): Boneta Lucks DPM.   Encounter Date: 06/07/2021   PT End of Session - 06/07/21 1607     Visit Number 11    Number of Visits 12    Date for PT Re-Evaluation 07/30/21    Authorization Type FOTO AT LEAST EVERY 5TH VISIT.  PROGRESS NOTE AT 10TH VISIT.  KX MODIFIER AFTER 15 VISITS.    PT Start Time 1431    PT Stop Time 1519    PT Time Calculation (min) 48 min    Equipment Utilized During Treatment Other (comment)   Rollator   Activity Tolerance Patient tolerated treatment well    Behavior During Therapy WFL for tasks assessed/performed             Past Medical History:  Diagnosis Date   Abnormal glucose    Depression with anxiety    Essential hypertension, benign    Fibromyalgia    Melanoma ([place])    Near syncope    Other and unspecified hyperlipidemia     Past Surgical History:  Procedure Laterality Date   ABDOMINAL HYSTERECTOMY     BREAST SURGERY     LEFT   CHOLECYSTECTOMY     ELBOW SURGERY     X's 3 Left   IR ABLATE LIVER CRYOABLATION  02/15/2021   IR RADIOLOGIST EVAL & MGMT  02/08/2021   IR RADIOLOGIST EVAL & MGMT  03/14/2021   RIGHT DISTAL RADIUS FRACTURE     TOTAL KNEE ARTHROPLASTY     RIGHT    There were no vitals filed for this visit.   Subjective Assessment - 06/07/21 1442     Subjective COVID-19 screen performed prior to patient entering clinic. Goes to MD tomorrow. No pain.    Pertinent History HTN, Fibromyalgia, left elbow surgery, right TKA, h/o left knee pain and bilateral shoulder pain.    How long can you walk comfortably? Around home with a Rollator.    Patient Stated Goals Walk without pain.    Currently in Pain? No/denies                [facility] PT Assessment -  06/07/21 0001       Assessment   Medical Diagnosis S/p Peroneal tendon repair.    Referring Provider (PT) Boneta Lucks DPM.    Onset Date/Surgical Date 12/18/20    Next MD Visit 06/08/2021      Precautions   Precautions Fall    Precaution Comments Patient walks with a Rollator for safety.      ROM / Strength   AROM / PROM / Strength AROM      AROM   Overall AROM  Deficits    AROM Assessment Site Ankle    Right/Left Ankle Right    Right Ankle Dorsiflexion -2    Right Ankle Plantar Flexion 20    Right Ankle Eversion 3                           [facility] Adult PT Treatment/Exercise - 06/07/21 0001       Ambulation/Gait   Ambulation/Gait Yes    Ambulation/Gait Assistance 6: Modified independent (Device/Increase time)    Ambulation Distance (Feet) 272 Feet    Assistive device Rollator    Gait  Pattern Within Functional Limits;Trunk flexed;Narrow base of support   R ankle inverted   Ambulation Surface Level;Indoor      Modalities   Modalities Vasopneumatic      Vasopneumatic   Number Minutes Vasopneumatic  10 minutes    Vasopnuematic Location  Ankle    Vasopneumatic Pressure Low    Vasopneumatic Temperature  34      Manual Therapy   Manual Therapy Passive ROM      Ankle Exercises: Aerobic   Nustep L3 x11 min      Ankle Exercises: Seated   Other Seated Ankle Exercises Seated Rockerboard x 4 minutes into DF/PF and 3 minutes into INV/EVER.    Other Seated Ankle Exercises Dynadisc eversion x20 reps, circles x20 reps                          PT Long Term Goals - 06/07/21 1452       PT LONG TERM GOAL #1   Title Independent with a HEP.    Time 6    Period Weeks    Status Achieved    Target Date 07/19/19      PT LONG TERM GOAL #2   Title Increase right ankle dorsiflexion to 6- 8 degrees to normalize the patients gait pattern.    Time 6    Period Weeks    Status Not Met      PT LONG TERM GOAL #3   Title Increase right ankle eversion  to 0 to 5 degrees to normalize the patients gait pattern.    Time 6    Period Weeks    Status Achieved      PT LONG TERM GOAL #4   Title Walk 250 feet with Rollator with right ankle pain not > 3/10.    Time 6    Period Weeks    Status Achieved                   Plan - 06/07/21 1510     Clinical Impression Statement Patient has made some progress towards PT LTGs. Patient's R ankle DF is still very limited and ankle remains fairly swollen. Patient's R ankle eversion WNL for goals but patient's R ankle rests in inversion and stands during stance phase of gait in ankle inversion as well. Patient able to walk 272 ft within clinic with R ankle pain at approximatley 2/10. Normal vasopneumatic response noted following removal of the modality.    Personal Factors and Comorbidities Comorbidity 1;Other;Comorbidity 2    Comorbidities HTN, Fibromyalgia, left elbow surgery, right TKA, h/o left knee pain and bilateral shoulder pain.    Examination-Activity Limitations Other;Locomotion Level;Stand    Examination-Participation Restrictions Other;Meal Prep    Stability/Clinical Decision Making Evolving/Moderate complexity    Rehab Potential Good    PT Frequency 2x / week    PT Duration 6 weeks    PT Treatment/Interventions ADLs/Self Care Home Management;Cryotherapy;Electrical Stimulation;Iontophoresis 25m/ml Dexamethasone;Moist Heat;Ultrasound;Neuromuscular re-education;Therapeutic exercise;Therapeutic activities;Functional mobility training;Gait training;Patient/family education;Manual techniques;Passive range of motion;Vasopneumatic Device    PT Next Visit Plan Right ankle PROM, seated Rockerboard and BAPS board, STW/M and modalities as needed.  Nustep.    Consulted and Agree with Plan of Care Patient             Patient will benefit from skilled therapeutic intervention in order to improve the following deficits and impairments:  Pain, Abnormal gait, Difficulty walking, Decreased  activity tolerance, Decreased mobility, Increased edema, Decreased  range of motion  Visit Diagnosis: Pain in right ankle and joints of right foot  Localized edema  Stiffness of right ankle, not elsewhere classified     Problem List Patient Active Problem List   Diagnosis Date Noted   Stasis dermatitis of both legs 12/24/2018   Leg pain, bilateral 12/24/2018   Degenerative disc disease, lumbar 12/24/2018   Fibromyalgia 12/24/2018   Vitamin D deficiency 12/24/2018   Class 3 severe obesity with serious comorbidity and body mass index (BMI) of 40.0 to 44.9 in adult ([place]) 07/23/2017   Closed fracture of right inferior pubic ramus ([place]) 11/18/2016   Gastroesophageal reflux disease 06/06/2016   Recurrent major depressive disorder, in full remission ([place]) 06/06/2016   DM type 2 with diabetic dyslipidemia ([place]) 03/06/2016   Edema 09/14/2013   Precordial pain 08/20/2011   Essential hypertension, benign    Hyperlipidemia     Standley Brooking, PTA 06/07/2021, 3:27 PM  [facility] [address] [place], [place], [address] Phone: [phone]   Fax:  [phone]  Name: HAYLIN CAMILLI MRN: [id number] Date of Birth: 17-Apr-1946

## 2021-06-08 ENCOUNTER — Encounter: Payer: Self-pay | Admitting: Podiatry

## 2021-06-08 ENCOUNTER — Ambulatory Visit: Payer: Medicare Other | Admitting: Podiatry

## 2021-06-08 DIAGNOSIS — M6780 Other specified disorders of synovium and tendon, unspecified site: Secondary | ICD-10-CM | POA: Diagnosis not present

## 2021-06-08 DIAGNOSIS — M216X1 Other acquired deformities of right foot: Secondary | ICD-10-CM | POA: Diagnosis not present

## 2021-06-08 NOTE — Progress Notes (Signed)
Subjective:  Patient ID: Tiffany Brown, female    DOB: [birth year],  MRN: [id number]  No chief complaint on file.   76 y.o. female presents with the above complaint.  Patient presents with complaint of peroneal weakness to the right foot status post peroneal tendon repair on 12/18/2020 by me.  She states that she still having some trouble with the right foot especially with ambulation.  She states that she has been going to physical therapy has not helped much.  She states that she is able to walk but with bracing.  She still has problem and has while walking more on the outside of the foot.  Review of Systems: Negative except as noted in the HPI. Denies N/V/F/Ch.  Past Medical History:  Diagnosis Date   Abnormal glucose    Depression with anxiety    Essential hypertension, benign    Fibromyalgia    Melanoma ([place])    Near syncope    Other and unspecified hyperlipidemia     Current Outpatient Medications:    amLODipine (NORVASC) 5 MG tablet, Take 1 tablet (5 mg total) by mouth daily., Disp: 90 tablet, Rfl: 3   DULoxetine (CYMBALTA) 60 MG capsule, Take 2 capsules (120 mg total) by mouth daily., Disp: 180 capsule, Rfl: 3   fluticasone (FLONASE) 50 MCG/ACT nasal spray, Place 2 sprays into the nose daily as needed (allergies). , Disp: , Rfl:    furosemide (LASIX) 40 MG tablet, Take 1 tablet (40 mg total) by mouth 2 (two) times daily as needed., Disp: 180 tablet, Rfl: 3   gabapentin (NEURONTIN) 300 MG capsule, TAKE 1 CAPSULE BY MOUTH THREE TIMES A DAY, Disp: 90 capsule, Rfl: 3   ibuprofen (ADVIL) 800 MG tablet, TAKE 1 TABLET BY MOUTH EVERY 6 HOURS AS NEEDED, Disp: 60 tablet, Rfl: 1   losartan (COZAAR) 100 MG tablet, Take 1 tablet (100 mg total) by mouth daily., Disp: 90 tablet, Rfl: 3   meloxicam (MOBIC) 7.5 MG tablet, TAKE 1 TABLET BY MOUTH EVERY DAY, Disp: 90 tablet, Rfl: 3   methocarbamol (ROBAXIN) 750 MG tablet, Take 750 mg by mouth 2 (two) times daily., Disp: , Rfl:    omeprazole  (PRILOSEC) 40 MG capsule, Take 40 mg by mouth every morning., Disp: , Rfl:    oxyCODONE-acetaminophen (PERCOCET) 5-325 MG tablet, Take 1 tablet by mouth every 4 (four) hours as needed for severe pain., Disp: 30 tablet, Rfl: 0   pravastatin (PRAVACHOL) 80 MG tablet, Take 1 tablet (80 mg total) by mouth daily., Disp: 90 tablet, Rfl: 3   traMADol (ULTRAM-ER) 100 MG 24 hr tablet, Take 100 mg by mouth daily., Disp: , Rfl:   Social History   Tobacco Use  Smoking Status Former   Packs/day: 0.80   Years: 25.00   Pack years: 20.00   Types: Cigarettes   Quit date: 05/06/1994   Years since quitting: 27.1  Smokeless Tobacco Never    Allergies  Allergen Reactions   Other    Objective:  There were no vitals filed for this visit. There is no height or weight on file to calculate BMI. Constitutional Well developed. Well nourished.  Vascular Dorsalis pedis pulses palpable bilaterally. Posterior tibial pulses palpable bilaterally. Capillary refill normal to all digits.  No cyanosis or clubbing noted. Pedal hair growth normal.  Neurologic Normal speech. Oriented to person, place, and time. Epicritic sensation to light touch grossly present bilaterally.  Dermatologic Nails well groomed and normal in appearance. No open wounds. No skin lesions.  Orthopedic: Residual weakness of the peroneal tendon noted to the right lower extremity.  Weakness noted to the active dorsiflexion eversion of the foot.  4 out of 5.  Gait examination shows slightly pes cavovarus foot structure likely due to peroneal weakness and overpowering of the posterior tibial tendon.   Radiographs: None Assessment:   1. Cavovarus deformity of foot, acquired, right   2. Tendon weakness     Plan:  Patient was evaluated and treated and all questions answered.  Right cavovarus foot type status post weakness of the peroneal tendon with history of peroneal tendon repair -All questions and concerns were discussed with the patient  in extensive detail. -Given that patient did not want to do physical therapy while undergoing in the postop period I believe she will benefit from some physical therapy at this time to increase the strength of the peroneal tendons.  She does have some strength therefore it is intact and not concerned for an acute tear.  She also does not have any traumatic event that she can recall. -Physical therapy prescription was given -Continue wearing a Tri-Lock ankle brace -Patient will be scheduled to see Aaron Edelman for rigid bracing for cavus foot.  Likely an [place] type brace. -If no improvement with bracing we will need to do a surgical release of the posterior tibial tendon  No follow-ups on file.

## 2021-06-12 ENCOUNTER — Ambulatory Visit: Payer: Medicare Other | Admitting: *Deleted

## 2021-06-12 ENCOUNTER — Other Ambulatory Visit: Payer: Self-pay

## 2021-06-12 DIAGNOSIS — M25671 Stiffness of right ankle, not elsewhere classified: Secondary | ICD-10-CM

## 2021-06-12 DIAGNOSIS — M25571 Pain in right ankle and joints of right foot: Secondary | ICD-10-CM | POA: Diagnosis not present

## 2021-06-12 DIAGNOSIS — R600 Localized edema: Secondary | ICD-10-CM

## 2021-06-12 NOTE — Therapy (Signed)
[facility] [place], [place], [address] Phone: [phone]   Fax:  [phone]  Physical Therapy Treatment  Patient Details  Name: Tiffany Brown MRN: [id number] Date of Birth: 1945-09-17 Referring Provider (PT): Boneta Lucks DPM.   Encounter Date: 06/12/2021   PT End of Session - 06/12/21 1356     Visit Number 12    Number of Visits 12    Date for PT Re-Evaluation 07/30/21    Authorization Type FOTO AT LEAST EVERY 5TH VISIT.  PROGRESS NOTE AT 10TH VISIT.  KX MODIFIER AFTER 15 VISITS.   12th visit FOTO and DC    PT Start Time 8413    PT Stop Time 1420    PT Time Calculation (min) 35 min             Past Medical History:  Diagnosis Date   Abnormal glucose    Depression with anxiety    Essential hypertension, benign    Fibromyalgia    Melanoma ([place])    Near syncope    Other and unspecified hyperlipidemia     Past Surgical History:  Procedure Laterality Date   ABDOMINAL HYSTERECTOMY     BREAST SURGERY     LEFT   CHOLECYSTECTOMY     ELBOW SURGERY     X's 3 Left   IR ABLATE LIVER CRYOABLATION  02/15/2021   IR RADIOLOGIST EVAL & MGMT  02/08/2021   IR RADIOLOGIST EVAL & MGMT  03/14/2021   RIGHT DISTAL RADIUS FRACTURE     TOTAL KNEE ARTHROPLASTY     RIGHT    There were no vitals filed for this visit.   Subjective Assessment - 06/12/21 1355     Subjective COVID-19 screen performed prior to patient entering clinic. DC after today as per MD    Pertinent History HTN, Fibromyalgia, left elbow surgery, right TKA, h/o left knee pain and bilateral shoulder pain.    How long can you walk comfortably? Around home with a Rollator.    Patient Stated Goals Walk without pain.    Currently in Pain? Yes    Pain Score 2     Pain Location Ankle    Pain Orientation Right    Pain Onset More than a month ago                               [facility] Adult PT Treatment/Exercise - 06/12/21 0001        Exercises   Exercises Ankle      Modalities   Modalities --      Vasopneumatic   Number Minutes Vasopneumatic  --    Vasopnuematic Location  --    Vasopneumatic Pressure --    Vasopneumatic Temperature  --      Ankle Exercises: Aerobic   Nustep L3 x11 min      Ankle Exercises: Seated   Other Seated Ankle Exercises Seated Rockerboard x 4 minutes into DF/PF and 4 minutes into INV/EVER.    Other Seated Ankle Exercises Reviewed HEP and added PF with Red tubing given for HEP                          PT Long Term Goals - 06/12/21 1403       PT LONG TERM GOAL #1   Title Independent with a HEP.    Time 6    Period Weeks  Status Achieved      PT LONG TERM GOAL #2   Title Increase right ankle dorsiflexion to 6- 8 degrees to normalize the patients gait pattern.    Time 6    Period Weeks    Status Not Met      PT LONG TERM GOAL #3   Title Increase right ankle eversion to 0 to 5 degrees to normalize the patients gait pattern.    Period Weeks    Status Achieved      PT LONG TERM GOAL #4   Title Walk 250 feet with Rollator with right ankle pain not > 3/10.    Time 6    Period Weeks    Status Achieved                   Plan - 06/12/21 1357     Clinical Impression Statement Pt arrived today reporting the Dr said for her to DC from PT today and she will be fitted for a brace to help with her foot. Rx focused on HEP and Pt was able to meet LTgs except DF ROM due to deficits.    Personal Factors and Comorbidities Comorbidity 1;Other;Comorbidity 2    Comorbidities HTN, Fibromyalgia, left elbow surgery, right TKA, h/o left knee pain and bilateral shoulder pain.    Examination-Participation Restrictions Other;Meal Prep    Stability/Clinical Decision Making Evolving/Moderate complexity    Rehab Potential Good    PT Frequency 2x / week    PT Duration 6 weeks    PT Treatment/Interventions ADLs/Self Care Home Management;Cryotherapy;Electrical  Stimulation;Iontophoresis 43m/ml Dexamethasone;Moist Heat;Ultrasound;Neuromuscular re-education;Therapeutic exercise;Therapeutic activities;Functional mobility training;Gait training;Patient/family education;Manual techniques;Passive range of motion;Vasopneumatic Device    PT Next Visit Plan DC to HEP             Patient will benefit from skilled therapeutic intervention in order to improve the following deficits and impairments:  Pain, Abnormal gait, Difficulty walking, Decreased activity tolerance, Decreased mobility, Increased edema, Decreased range of motion  Visit Diagnosis: Pain in right ankle and joints of right foot  Localized edema  Stiffness of right ankle, not elsewhere classified     Problem List Patient Active Problem List   Diagnosis Date Noted   Stasis dermatitis of both legs 12/24/2018   Leg pain, bilateral 12/24/2018   Degenerative disc disease, lumbar 12/24/2018   Fibromyalgia 12/24/2018   Vitamin D deficiency 12/24/2018   Class 3 severe obesity with serious comorbidity and body mass index (BMI) of 40.0 to 44.9 in adult [place] 07/23/2017   Closed fracture of right inferior pubic ramus ([place] 11/18/2016   Gastroesophageal reflux disease 06/06/2016   Recurrent major depressive disorder, in full remission ([place] 06/06/2016   DM type 2 with diabetic dyslipidemia ([place] 03/06/2016   Edema 09/14/2013   Precordial pain 08/20/2011   Essential hypertension, benign    Hyperlipidemia     Tiffany Brown,CHRIS, PTA 06/12/2021, 6:01 PM  [facility] [address] [place] [address]Phone: [phone]  Fax:  [phone] Name: Tiffany BANKSONMRN: [id number] of Birth: [birth year]  PHYSICAL THERAPY DISCHARGE SUMMARY  Visits from Start of Care: 12.  Current functional level related to goals / functional outcomes: See above.   Remaining deficits: All but LTG #2 met.   Education / Equipment: HEP.   Patient  agrees to discharge. Patient goals were partially met. Patient is being discharged due to the physician's request.    CMaliApplegate MPT

## 2021-06-14 ENCOUNTER — Encounter: Payer: Medicare Other | Admitting: *Deleted

## 2021-06-18 ENCOUNTER — Ambulatory Visit: Payer: Medicare Other

## 2021-06-18 ENCOUNTER — Other Ambulatory Visit: Payer: Self-pay

## 2021-06-18 DIAGNOSIS — Z9889 Other specified postprocedural states: Secondary | ICD-10-CM

## 2021-06-18 DIAGNOSIS — M6780 Other specified disorders of synovium and tendon, unspecified site: Secondary | ICD-10-CM

## 2021-06-18 DIAGNOSIS — M216X1 Other acquired deformities of right foot: Secondary | ICD-10-CM

## 2021-06-18 DIAGNOSIS — M76822 Posterior tibial tendinitis, left leg: Secondary | ICD-10-CM

## 2021-06-18 DIAGNOSIS — M7671 Peroneal tendinitis, right leg: Secondary | ICD-10-CM

## 2021-06-18 NOTE — Progress Notes (Signed)
Patient seen for consultation for right AFO. Patient reports she has had braces but cannot recall if they were within the last five years or if they were billed to her Medicare plan. Explained to patient the same-or-similar clause and that if something has already been provided, Medicare will not approve another for five years from the last delivery. Patient requested we reach out and determine if her item will be covered or not. Will proceed with pre-cert and reschedule casting accordingly.

## 2021-06-26 ENCOUNTER — Telehealth: Payer: Self-pay | Admitting: Podiatry

## 2021-06-26 NOTE — Telephone Encounter (Signed)
Pt called stating she was trying to get in touch with the casting dept(Brian). She has left a message earlier this week.  She was seen last week and was told they were checking her insurance and would call her and let her know about a brace.  Upon talking to pt she stated the only brace she has was one she got when she had her surgery and it was a walking boot.  She is in a lot of pain and wanted to get scheduled asap. I have her  scheduled for next Tuesday 2.28.2023 to see Aaron Edelman.Marland Kitchen

## 2021-07-03 ENCOUNTER — Other Ambulatory Visit: Payer: Self-pay

## 2021-07-03 ENCOUNTER — Ambulatory Visit: Payer: Medicare Other

## 2021-07-03 DIAGNOSIS — S86311A Strain of muscle(s) and tendon(s) of peroneal muscle group at lower leg level, right leg, initial encounter: Secondary | ICD-10-CM

## 2021-07-03 DIAGNOSIS — M216X1 Other acquired deformities of right foot: Secondary | ICD-10-CM

## 2021-07-03 NOTE — Progress Notes (Signed)
SITUATION Patient Name:  Tiffany Brown MRN:   [id number] Reason for Visit: Evaluation for [facility] AFO  Patient Report: Chief Complaint:   Pain in right ankle Petra Kuba of Discomfort/Pain:  Ambulatory Standing Resting Location:    right lower extremity Onset & Duration:   Sudden, Started with accident, and Present longer than 3 months Course:    gradually worsening Aggravating or Alleviating Factors: Ambulation  OBJECTIVE DATA & MEASUREMENTS Prognosis:    Good Duration of use:   5 years  Diagnosis:   ICD-10-CM   1. Cavovarus deformity of foot, acquired, right  M21.6X1     2. Peroneal tendon tear, right, initial encounter  S86.311A       GOALS, NECESSITIES, & JUSTIFICATIONS Recommended Device: [place] Gauntlet Color:    Sand Closure:   Public affairs consultant Code Description Justification  right [phone] Plastic orthosis, custom molded from a model of the patient, custom fabricated, includes casting and cast preparation. Necessary to provide triplanar support to the foot/ankle complex  right L2330 Addition to lower extremity, lacer molded to patient model Necessary to ensure secure hold of orthosis to patient's limb  right L2820 Addition to lower extremity orthosis, soft interface for molded plastic below knee section Necessary to relieve pressure on bony prominences    I certify that Kanda C Sarwar qualifies for and will benefit from an ankle foot orthosis used during ambulation based on meeting all of the following criteria;   The patient is: - Ambulatory, and - Has weakness or deformity of the foot and ankle, and - Requires stabilization for medical reasons, and - Has the potential to benefit functionally  The patients medical record contains sufficient documentation of the patients medical condition to substantiate the necessity for the type and quantity of the items ordered.  The goals of this therapy: - Improve Mobility - Improve Lower Extremity  Stability - Decrease Pain - Facilitate Soft Tissue Healing - Facilitate Immobilization, healing and treatment of an injury  Necessity of Ankle Foot Orthotic molded to patient model: A custom (vs. prefabricated) ankle foot orthosis has been prescribed based on the following criteria which are specific to the condition of this patient; - The patient could not be fit with a prefabricated AFO - The condition necessitating the orthosis is expected to be permanent or of longstanding duration (more than 6 months) - There is need to control the ankle or foot in more than one plane - The patient has a documented neurological, circulatory, or orthopedic condition that requires custom fabrication over a model to prevent tissue injury - The patient has a healing fracture that lacks normal anatomical integrity or anthropometric proportions  I hereby certify that the ankle foot orthotic described above is a rigid or semi-rigid device which is used for the purpose of supporting a weak or deformed body member or restricting or eliminating motion in a diseased or injured part of the body. It is designed to provide support and counterforce on the limb or body part that is being braced. In my opinion, the custom molded ankle foot orthosis is both reasonable and necessary in reference to accepted standards of medical practice in the treatment  of the patient condition and rehabilitation.  ACTIONS PERFORMED Patient was evaluated and casted for [place] AFO via [employer]. Procedure was explained to patient and family. Patient tolerated procedure. patient and family selected device color and closure method.   PLAN Patient to return in four to six weeks for fitting and delivery  of device. Plan of care was explained to and agreed upon by patient and family. All questions were answered and concerns addressed.

## 2021-07-24 DIAGNOSIS — M25561 Pain in right knee: Secondary | ICD-10-CM | POA: Diagnosis not present

## 2021-07-24 DIAGNOSIS — Z79891 Long term (current) use of opiate analgesic: Secondary | ICD-10-CM | POA: Diagnosis not present

## 2021-07-25 ENCOUNTER — Encounter: Payer: Self-pay | Admitting: Family Medicine

## 2021-07-25 ENCOUNTER — Ambulatory Visit (INDEPENDENT_AMBULATORY_CARE_PROVIDER_SITE_OTHER): Payer: Medicare Other | Admitting: Family Medicine

## 2021-07-25 VITALS — BP 135/76 | HR 72 | Ht 66.0 in | Wt 268.0 lb

## 2021-07-25 DIAGNOSIS — E1169 Type 2 diabetes mellitus with other specified complication: Secondary | ICD-10-CM

## 2021-07-25 DIAGNOSIS — E785 Hyperlipidemia, unspecified: Secondary | ICD-10-CM

## 2021-07-25 DIAGNOSIS — M797 Fibromyalgia: Secondary | ICD-10-CM

## 2021-07-25 DIAGNOSIS — Z23 Encounter for immunization: Secondary | ICD-10-CM | POA: Diagnosis not present

## 2021-07-25 DIAGNOSIS — I10 Essential (primary) hypertension: Secondary | ICD-10-CM | POA: Diagnosis not present

## 2021-07-25 DIAGNOSIS — F3342 Major depressive disorder, recurrent, in full remission: Secondary | ICD-10-CM | POA: Diagnosis not present

## 2021-07-25 DIAGNOSIS — E782 Mixed hyperlipidemia: Secondary | ICD-10-CM

## 2021-07-25 DIAGNOSIS — M1712 Unilateral primary osteoarthritis, left knee: Secondary | ICD-10-CM | POA: Diagnosis not present

## 2021-07-25 LAB — BAYER DCA HB A1C WAIVED: HB A1C (BAYER DCA - WAIVED): 7.2 % — ABNORMAL HIGH (ref 4.8–5.6)

## 2021-07-25 MED ORDER — DULOXETINE HCL 60 MG PO CPEP: 120.0000 mg | ORAL_CAPSULE | Freq: Every day | ORAL | 3 refills | Status: DC

## 2021-07-25 MED ORDER — PRAVASTATIN SODIUM 80 MG PO TABS: 80.0000 mg | ORAL_TABLET | Freq: Every day | ORAL | 3 refills | Status: DC

## 2021-07-25 MED ORDER — FUROSEMIDE 40 MG PO TABS: 40.0000 mg | ORAL_TABLET | Freq: Two times a day (BID) | ORAL | 3 refills | Status: DC | PRN

## 2021-07-25 MED ORDER — LOSARTAN POTASSIUM 100 MG PO TABS: 100.0000 mg | ORAL_TABLET | Freq: Every day | ORAL | 3 refills | Status: DC

## 2021-07-25 MED ORDER — AMLODIPINE BESYLATE 5 MG PO TABS: 5.0000 mg | ORAL_TABLET | Freq: Every day | ORAL | 3 refills | Status: DC

## 2021-07-25 MED ORDER — BUPROPION HCL ER (XL) 150 MG PO TB24: 150.0000 mg | ORAL_TABLET | Freq: Every day | ORAL | 3 refills | Status: DC

## 2021-07-25 MED ORDER — OZEMPIC (0.25 OR 0.5 MG/DOSE) 2 MG/1.5ML ~~LOC~~ SOPN: PEN_INJECTOR | SUBCUTANEOUS | 3 refills | Status: AC

## 2021-07-25 NOTE — Progress Notes (Signed)
? ?BP 135/76   Pulse 72   Ht '5\' 6"'$  (1.676 m)   Wt 268 lb (121.6 kg)   SpO2 99%   BMI 43.26 kg/m?   ? ?Subjective:  ? ?Patient ID: Tiffany Brown, female    DOB: 22-Oct-1945, 76 y.o.   MRN: [id number] ? ?HPI: ?Tiffany Brown is a 76 y.o. female presenting on 07/25/2021 for Medical Management of Chronic Issues, Hypertension, Diabetes, and Hyperlipidemia ? ? ?HPI ?Type 2 diabetes mellitus ?Patient comes in today for recheck of his diabetes. Patient has been currently taking no medication, a1c is worse at 7.2. Patient is currently on an ACE inhibitor/ARB. Patient has not seen an ophthalmologist this year. Patient denies any issues with their feet. The symptom started onset as an adult hypertension and hyperlipidemia ARE RELATED TO DM  ? ?Hypertension ?Patient is currently on amlodipine and furosemide and losartan, and their blood pressure today is 135/76. Patient denies any lightheadedness or dizziness. Patient denies headaches, blurred vision, chest pains, shortness of breath, or weakness. Denies any side effects from medication and is content with current medication.  ? ?Hyperlipidemia ?Patient is coming in for recheck of his hyperlipidemia. The patient is currently taking pravastatin. They deny any issues with myalgias or history of liver damage from it. They deny any focal numbness or weakness or chest pain.  ? ?Depression recheck ?Patient is coming in for depression recheck, she currently take Cymbalta.  She just feels like she has been depressed and not feeling well.  She did try and increase her Cymbalta to 3 tablets a day.  She says she felt a little better with but still having some issues with depression and a lot of it is related to her health and her pain. ? ?  07/25/2021  ?  1:32 PM 04/26/2021  ?  1:55 PM 01/25/2021  ?  1:31 PM 01/25/2021  ?  1:07 PM 10/25/2020  ? 10:15 AM  ?Depression screen PHQ 2/9  ?Decreased Interest '3 1 1 '$ 0 0  ?Down, Depressed, Hopeless '3 1 1 3 3  '$ ?PHQ - 2 Score '6 2 2 3 3  '$ ?Altered  sleeping '3 1 1 3 3  '$ ?Tired, decreased energy '3 1 1 3 3  '$ ?Change in appetite '3 1 1 3 3  '$ ?Feeling bad or failure about yourself  '3 1 1 2 2  '$ ?Trouble concentrating '3 1 1 '$ 0 0  ?Moving slowly or fidgety/restless 2 0 0 0 0  ?Suicidal thoughts 3 0 0 0 0  ?PHQ-9 Score '26 7 7 14 14  '$ ?  ?Patient has a lot of pain including in her left knee and right foot and lower back that is been worsening.  She is being more wheelchair-bound.  She has not done anything to get up and start exercising despite recommendations. ? ? ?Relevant past medical, surgical, family and social history reviewed and updated as indicated. Interim medical history since our last visit reviewed. ?Allergies and medications reviewed and updated. ? ?Review of Systems  ?Constitutional:  Negative for chills and fever.  ?Eyes:  Negative for visual disturbance.  ?Respiratory:  Negative for chest tightness and shortness of breath.   ?Cardiovascular:  Negative for chest pain and leg swelling.  ?Genitourinary:  Negative for difficulty urinating and dysuria.  ?Musculoskeletal:  Positive for arthralgias and myalgias. Negative for back pain and gait problem.  ?Skin:  Negative for rash.  ?Neurological:  Negative for light-headedness and headaches.  ?Psychiatric/Behavioral:  Positive for dysphoric mood. Negative for agitation,  behavioral problems, self-injury, sleep disturbance and suicidal ideas. The patient is nervous/anxious.   ?All other systems reviewed and are negative. ? ?Per HPI unless specifically indicated above ? ? ?Allergies as of 07/25/2021   ? ?   Reactions  ? Other   ? ?  ? ?  ?Medication List  ?  ? ?  ? Accurate as of July 25, 2021  2:29 PM. If you have any questions, ask your nurse or doctor.  ?  ?  ? ?  ? ?amLODipine 5 MG tablet ?Commonly known as: NORVASC ?Take 1 tablet (5 mg total) by mouth daily. ?  ?buPROPion 150 MG 24 hr tablet ?Commonly known as: Wellbutrin XL ?Take 1 tablet (150 mg total) by mouth daily. ?Started by: Worthy Rancher, MD ?   ?DULoxetine 60 MG capsule ?Commonly known as: CYMBALTA ?Take 2 capsules (120 mg total) by mouth daily. ?  ?fluticasone 50 MCG/ACT nasal spray ?Commonly known as: FLONASE ?Place 2 sprays into the nose daily as needed (allergies). ?  ?furosemide 40 MG tablet ?Commonly known as: LASIX ?Take 1 tablet (40 mg total) by mouth 2 (two) times daily as needed. ?  ?gabapentin 300 MG capsule ?Commonly known as: NEURONTIN ?TAKE 1 CAPSULE BY MOUTH THREE TIMES A DAY ?  ?ibuprofen 800 MG tablet ?Commonly known as: ADVIL ?TAKE 1 TABLET BY MOUTH EVERY 6 HOURS AS NEEDED ?  ?losartan 100 MG tablet ?Commonly known as: COZAAR ?Take 1 tablet (100 mg total) by mouth daily. ?  ?meloxicam 7.5 MG tablet ?Commonly known as: MOBIC ?TAKE 1 TABLET BY MOUTH EVERY DAY ?  ?methocarbamol 750 MG tablet ?Commonly known as: ROBAXIN ?Take 750 mg by mouth 2 (two) times daily. ?  ?omeprazole 40 MG capsule ?Commonly known as: PRILOSEC ?Take 40 mg by mouth every morning. ?  ?oxyCODONE-acetaminophen 5-325 MG tablet ?Commonly known as: Percocet ?Take 1 tablet by mouth every 4 (four) hours as needed for severe pain. ?  ?Ozempic (0.25 or 0.5 MG/DOSE) 2 MG/1.5ML Sopn ?Generic drug: Semaglutide(0.25 or 0.'5MG'$ /DOS) ?Inject 0.25 mg into the skin once a week for 14 days, THEN 0.5 mg once a week for 14 days. ?Start taking on: July 25, 2021 ?Started by: Worthy Rancher, MD ?  ?pravastatin 80 MG tablet ?Commonly known as: PRAVACHOL ?Take 1 tablet (80 mg total) by mouth daily. ?  ?traMADol 100 MG 24 hr tablet ?Commonly known as: ULTRAM-ER ?Take 100 mg by mouth daily. ?  ? ?  ? ? ? ?Objective:  ? ?BP 135/76   Pulse 72   Ht '5\' 6"'$  (1.676 m)   Wt 268 lb (121.6 kg)   SpO2 99%   BMI 43.26 kg/m?   ?Wt Readings from Last 3 Encounters:  ?07/25/21 268 lb (121.6 kg)  ?04/26/21 282 lb (127.9 kg)  ?01/25/21 270 lb (122.5 kg)  ?  ?Physical Exam ?Vitals and nursing note reviewed.  ?Constitutional:   ?   General: She is not in acute distress. ?   Appearance: She is  well-developed. She is not diaphoretic.  ?Eyes:  ?   Conjunctiva/sclera: Conjunctivae normal.  ?Cardiovascular:  ?   Rate and Rhythm: Normal rate and regular rhythm.  ?   Heart sounds: Normal heart sounds. No murmur heard. ?Pulmonary:  ?   Effort: Pulmonary effort is normal. No respiratory distress.  ?   Breath sounds: Normal breath sounds. No wheezing.  ?Musculoskeletal:     ?   General: Tenderness (Left knee tenderness and pain with range of motion)  present.  ?Skin: ?   General: Skin is warm and dry.  ?   Findings: No rash.  ?Neurological:  ?   Mental Status: She is alert and oriented to person, place, and time.  ?   Coordination: Coordination normal.  ?Psychiatric:     ?   Behavior: Behavior normal.  ? ? ? ? ?Assessment & Plan:  ? ?Problem List Items Addressed This Visit   ? ?  ? Cardiovascular and Mediastinum  ? Essential hypertension, benign  ? Relevant Medications  ? amLODipine (NORVASC) 5 MG tablet  ? furosemide (LASIX) 40 MG tablet  ? losartan (COZAAR) 100 MG tablet  ? pravastatin (PRAVACHOL) 80 MG tablet  ?  ? Endocrine  ? DM type 2 with diabetic dyslipidemia ([place]) - Primary  ? Relevant Medications  ? losartan (COZAAR) 100 MG tablet  ? pravastatin (PRAVACHOL) 80 MG tablet  ? Semaglutide,0.25 or 0.'5MG'$ /DOS, (OZEMPIC, 0.25 OR 0.5 MG/DOSE,) 2 MG/1.5ML SOPN  ? Other Relevant Orders  ? Bayer DCA Hb A1c Waived  ?  ? Other  ? Hyperlipidemia  ? Relevant Medications  ? amLODipine (NORVASC) 5 MG tablet  ? furosemide (LASIX) 40 MG tablet  ? losartan (COZAAR) 100 MG tablet  ? pravastatin (PRAVACHOL) 80 MG tablet  ? Recurrent major depressive disorder, in full remission ([place])  ? Relevant Medications  ? DULoxetine (CYMBALTA) 60 MG capsule  ? buPROPion (WELLBUTRIN XL) 150 MG 24 hr tablet  ? Fibromyalgia  ? Relevant Medications  ? DULoxetine (CYMBALTA) 60 MG capsule  ? buPROPion (WELLBUTRIN XL) 150 MG 24 hr tablet  ? ?Other Visit Diagnoses   ? ? Need for shingles vaccine      ? Relevant Orders  ? Varicella-zoster vaccine IM  (Shingrix) (Completed)  ? Primary osteoarthritis of left knee      ? Relevant Orders  ? Ambulatory referral to Physical Therapy  ? ?  ?  ?We will add Wellbutrin, continue Cymbalta at current dose.  Cannot increase further because i

## 2021-07-26 ENCOUNTER — Other Ambulatory Visit: Payer: Self-pay | Admitting: Family Medicine

## 2021-07-26 DIAGNOSIS — Z1231 Encounter for screening mammogram for malignant neoplasm of breast: Secondary | ICD-10-CM

## 2021-07-28 ENCOUNTER — Other Ambulatory Visit: Payer: Self-pay | Admitting: Podiatry

## 2021-07-31 ENCOUNTER — Encounter: Payer: Self-pay | Admitting: Physical Therapy

## 2021-07-31 ENCOUNTER — Ambulatory Visit: Payer: Medicare Other | Attending: Family Medicine | Admitting: Physical Therapy

## 2021-07-31 ENCOUNTER — Other Ambulatory Visit: Payer: Self-pay

## 2021-07-31 DIAGNOSIS — M6281 Muscle weakness (generalized): Secondary | ICD-10-CM | POA: Insufficient documentation

## 2021-07-31 DIAGNOSIS — M25562 Pain in left knee: Secondary | ICD-10-CM | POA: Diagnosis not present

## 2021-07-31 DIAGNOSIS — G8929 Other chronic pain: Secondary | ICD-10-CM | POA: Diagnosis not present

## 2021-07-31 DIAGNOSIS — M25662 Stiffness of left knee, not elsewhere classified: Secondary | ICD-10-CM | POA: Diagnosis not present

## 2021-07-31 DIAGNOSIS — M1712 Unilateral primary osteoarthritis, left knee: Secondary | ICD-10-CM | POA: Diagnosis not present

## 2021-07-31 NOTE — Therapy (Signed)
[place] ?[facility] ?[address] ?[place], [place], [address] ?Phone: [phone]   Fax:  [phone] ? ?Physical Therapy Evaluation ? ?Patient Details  ?Name: Tiffany Brown ?MRN: [id number] ?Date of Birth: [birth year] ?Referring Provider (PT): Caryl Pina MD ? ? ?Encounter Date: 07/31/2021 ? ? PT End of Session - 07/31/21 1226   ? ? Visit Number 1   ? Number of Visits 8   ? Date for PT Re-Evaluation 10/29/21   ? PT Start Time 1122   ? PT Stop Time 1201   ? PT Time Calculation (min) 39 min   ? Activity Tolerance Patient tolerated treatment well   ? Behavior During Therapy [facility] for tasks assessed/performed   ? ?  ?  ? ?  ? ? ?Past Medical History:  ?Diagnosis Date  ? Abnormal glucose   ? Depression with anxiety   ? Essential hypertension, benign   ? Fibromyalgia   ? Melanoma ([place])   ? Near syncope   ? Other and unspecified hyperlipidemia   ? ? ?Past Surgical History:  ?Procedure Laterality Date  ? ABDOMINAL HYSTERECTOMY    ? BREAST SURGERY    ? LEFT  ? CHOLECYSTECTOMY    ? ELBOW SURGERY    ? X's 3 Left  ? IR ABLATE LIVER CRYOABLATION  02/15/2021  ? IR RADIOLOGIST EVAL & MGMT  02/08/2021  ? IR RADIOLOGIST EVAL & MGMT  03/14/2021  ? RIGHT DISTAL RADIUS FRACTURE    ? TOTAL KNEE ARTHROPLASTY    ? RIGHT  ? ? ?There were no vitals filed for this visit. ? ? ? Subjective Assessment - 07/31/21 1232   ? ? Subjective COVID-19 screen performed prior to patient entering clinic.  The patient presents to the clinic today with c/o chronic left knee pain.  She states she has had mutiple injections and nothing has helped her long-term.  Her pain-level is rted at an 8/10 today ans can become a 10/10 within 10 minutes of standing and walking.  Her goal is to get a total knee replacement as her qulity of life is negtively impacted and she states she is unable to do her housework and prepare food.   ? Pertinent History HTN, Fibromyalgia, left elbow surgery, right TKA, h/o left knee pain and bilateral  shoulder pain.   ? How long can you walk comfortably? Around home with a Rollator.   ? Patient Stated Goals Get left knee replaced.   ? Currently in Pain? Yes   ? Pain Score 8    ? Pain Location Knee   ? Pain Orientation Left   ? Pain Descriptors / Indicators Aching;Shooting;Throbbing;Sore   ? Pain Type Chronic pain   ? Pain Onset More than a month ago   ? Pain Frequency Constant   ? Aggravating Factors  See above.   ? Pain Relieving Factors See above.   ? ?  ?  ? ?  ? ? ? ? ? [facility] PT Assessment - 07/31/21 0001   ? ?  ? Assessment  ? Medical Diagnosis Primary OA of left knee.   ? Referring Provider (PT) Caryl Pina MD   ? Onset Date/Surgical Date --   Ongoing.  ?  ? Precautions  ? Precautions Fall   ? Precaution Comments Patient requires a Rollator at all times.   ?  ? Restrictions  ? Weight Bearing Restrictions No   ?  ? Balance Screen  ? Has the patient fallen in the past 6 months Yes   ?  How many times? 1.   ? Has the patient had a decrease in activity level because of a fear of falling?  Yes   ? Is the patient reluctant to leave their home because of a fear of falling?  Yes   ?  ? Home Environment  ? Living Environment Private residence   ?  ? Prior Function  ? Level of Independence Independent   ?  ? Posture/Postural Control  ? Posture Comments Right ankle in inversion.   ?  ? ROM / Strength  ? AROM / PROM / Strength AROM;Strength   ?  ? AROM  ? Overall AROM Comments Left knee extension is -28 degrees and flexion is 95 degrees.   ?  ? Strength  ? Overall Strength Comments Left hip flexion and abduction to 4 to 4+/5.  Left knee extension is 4 to 4+/5.   ?  ? Special Tests  ? Other special tests Left patellar mobility is severely limited in all directions.   ?  ? Ambulation/Gait  ? Gait Comments Very slow and antalgic gait with a Rollator.   ? ?  ?  ? ?  ? ? ? ? ? ? ? ? ? ? ? ? ? ?Objective measurements completed on examination: See above findings.  ? ? ? ? ? [facility] Adult PT Treatment/Exercise - 07/31/21 0001    ? ?  ? Electrical Stimulation  ? Electrical Stimulation Location Left knee.   ? Electrical Stimulation Action IFC at 80-150 Hz.   ? Electrical Stimulation Parameters 40% scan x 15 minutes.   ? Electrical Stimulation Goals Pain   ?  ? Vasopneumatic  ? Number Minutes Vasopneumatic  15 minutes   ? Vasopnuematic Location  --   Left knee.  ? Vasopneumatic Pressure Low   ? ?  ?  ? ?  ? ? ? ? ? ? ? ? ? ? ? ? ? ? ? PT Long Term Goals - 07/31/21 1314   ? ?  ? PT LONG TERM GOAL #1  ? Title Independent with a HEP.   ? Time 4   ? Period Weeks   ? Status New   ?  ? PT LONG TERM GOAL #2  ? Title Improve left knee extension to -10 degrees.   ? Time 4   ? Period Weeks   ? Status New   ?  ? PT LONG TERM GOAL #3  ? Title Improve left knee flexion to 115 degrees.   ? Time 4   ? Period Weeks   ? Status New   ?  ? PT LONG TERM GOAL #4  ? Title Stand 15 minutes with left knee pain not > 5/10.   ? Time 4   ? Period Weeks   ? Status New   ? ?  ?  ? ?  ? ? ? ? ? ? ? ? ? Plan - 07/31/21 1253   ? ? Clinical Impression Statement The patient presents to the clinic today with c/o chronic left knee pain.  She has very limited left knee range of motion especially into extension.  Her patellar mobility is also severely limited in all directions.  Her functional mobility is imapired and her gait is very antalgic using a Rollator.  Her right ankle is in inversion and she is having a brace made which she expects to receive within the next few weeks.  Patient will benefit from skilled physical therapy intervention to address pain and deficits.   ?  Personal Factors and Comorbidities Comorbidity 1;Other;Comorbidity 2   ? Comorbidities HTN, Fibromyalgia, left elbow surgery, right TKA, h/o left knee pain and bilateral shoulder pain, left ankle surgery.   ? Examination-Activity Limitations Other;Locomotion Level;Stand   ? Examination-Participation Restrictions Other;Meal Prep   ? Stability/Clinical Decision Making Evolving/Moderate complexity   ? Rehab  Potential Good   ? PT Frequency 2x / week   ? PT Duration 6 weeks   ? PT Treatment/Interventions ADLs/Self Care Home Management;Cryotherapy;Electrical Stimulation;Iontophoresis '4mg'$ /ml Dexamethasone;Moist Heat;Ultrasound;Neuromuscular re-education;Therapeutic exercise;Therapeutic activities;Functional mobility training;Gait training;Patient/family education;Manual techniques;Passive range of motion;Vasopneumatic Device   ? PT Next Visit Plan Left knee passive range of motion, patellar mobilty, pain-free strengthening.  Modalities and STW/M as needed.  Left patellar mobility.   ? Consulted and Agree with Plan of Care Patient   ? ?  ?  ? ?  ? ? ?Patient will benefit from skilled therapeutic intervention in order to improve the following deficits and impairments:  Pain, Abnormal gait, Difficulty walking, Decreased activity tolerance, Decreased mobility, Decreased range of motion, Decreased strength ? ?Visit Diagnosis: ?Chronic pain of left knee - Plan: PT plan of care cert/re-cert ? ?Stiffness of left knee, not elsewhere classified - Plan: PT plan of care cert/re-cert ? ?Muscle weakness (generalized) - Plan: PT plan of care cert/re-cert ? ? ? ? ?Problem List ?Patient Active Problem List  ? Diagnosis Date Noted  ? Stasis dermatitis of both legs 12/24/2018  ? Leg pain, bilateral 12/24/2018  ? Degenerative disc disease, lumbar 12/24/2018  ? Fibromyalgia 12/24/2018  ? Vitamin D deficiency 12/24/2018  ? Class 3 severe obesity with serious comorbidity and body mass index (BMI) of 40.0 to 44.9 in adult [facility]) 07/23/2017  ? Closed fracture of right inferior pubic ramus ([place]) 11/18/2016  ? Gastroesophageal reflux disease 06/06/2016  ? Recurrent major depressive disorder, in full remission ([place]) 06/06/2016  ? DM type 2 with diabetic dyslipidemia ([place]) 03/06/2016  ? Edema 09/14/2013  ? Precordial pain 08/20/2011  ? Essential hypertension, benign   ? Hyperlipidemia   ? ? ?Zackerie Sara, [place], PT ?07/31/2021, 1:19 PM ? ?[facility] ?[place] ?[place], [place], [address] ?Phone: [phone]   Fax:  [phone] ? ?Name: Tiffany Brown ?MRN: [id number] ?Date of Birth: 11-04-1945 ? ? ?

## 2021-08-02 ENCOUNTER — Ambulatory Visit: Payer: Medicare Other | Admitting: Physical Therapy

## 2021-08-02 DIAGNOSIS — M6281 Muscle weakness (generalized): Secondary | ICD-10-CM

## 2021-08-02 DIAGNOSIS — G8929 Other chronic pain: Secondary | ICD-10-CM

## 2021-08-02 DIAGNOSIS — M25662 Stiffness of left knee, not elsewhere classified: Secondary | ICD-10-CM | POA: Diagnosis not present

## 2021-08-02 DIAGNOSIS — M25562 Pain in left knee: Secondary | ICD-10-CM | POA: Diagnosis not present

## 2021-08-02 DIAGNOSIS — M1712 Unilateral primary osteoarthritis, left knee: Secondary | ICD-10-CM | POA: Diagnosis not present

## 2021-08-02 NOTE — Therapy (Signed)
[facility] ?[facility] ?[address] ?[place], [place], [address] ?Phone: [phone]   Fax:  [phone] ? ?Physical Therapy Treatment ? ?Patient Details  ?Name: Tiffany Brown ?MRN: [id number] ?Date of Birth: 05/05/1946 ?Referring Provider (PT): Caryl Pina MD ? ? ?Encounter Date: 08/02/2021 ? ? PT End of Session - 08/02/21 1240   ? ? Visit Number 2   ? Number of Visits 8   ? Date for PT Re-Evaluation 10/29/21   ? Authorization Type FOTO AT LEAST EVERY 5TH VISIT.  PROGRESS NOTE AT 10TH VISIT.  KX MODIFIER AFTER 15 VISITS.   12th visit FOTO and DC   ? PT Start Time 1115   ? PT Stop Time 1156   ? PT Time Calculation (min) 41 min   ? Activity Tolerance Patient tolerated treatment well   ? Behavior During Therapy [facility] for tasks assessed/performed   ? ?  ?  ? ?  ? ? ?Past Medical History:  ?Diagnosis Date  ? Abnormal glucose   ? Depression with anxiety   ? Essential hypertension, benign   ? Fibromyalgia   ? Melanoma ([place])   ? Near syncope   ? Other and unspecified hyperlipidemia   ? ? ?Past Surgical History:  ?Procedure Laterality Date  ? ABDOMINAL HYSTERECTOMY    ? BREAST SURGERY    ? LEFT  ? CHOLECYSTECTOMY    ? ELBOW SURGERY    ? X's 3 Left  ? IR ABLATE LIVER CRYOABLATION  02/15/2021  ? IR RADIOLOGIST EVAL & MGMT  02/08/2021  ? IR RADIOLOGIST EVAL & MGMT  03/14/2021  ? RIGHT DISTAL RADIUS FRACTURE    ? TOTAL KNEE ARTHROPLASTY    ? RIGHT  ? ? ?There were no vitals filed for this visit. ? ? Subjective Assessment - 08/02/21 1240   ? ? Subjective No new complaints.   ? Pertinent History HTN, Fibromyalgia, left elbow surgery, right TKA, h/o left knee pain and bilateral shoulder pain.   ? How long can you walk comfortably? Around home with a Rollator.   ? Patient Stated Goals Get left knee replaced.   ? Currently in Pain? Yes   ? Pain Score 8    ? Pain Orientation Left   ? Pain Descriptors / Indicators Aching;Throbbing;Shooting;Sore   ? Pain Type Chronic pain   ? Pain Onset More than a  month ago   ? ?  ?  ? ?  ? ? ? ? ? ? ? ? ? ? ? ? ? ? ? ? ? ? ? ? [place] [facility] Adult PT Treatment/Exercise - 08/02/21 0001   ? ?  ? Exercises  ? Exercises Knee/Hip   ?  ? Knee/Hip Exercises: Aerobic  ? Nustep Level 1 x 10 minutes.   ?  ? Modalities  ? Modalities Electrical Stimulation;Vasopneumatic   ?  ? Electrical Stimulation  ? Electrical Stimulation Location Left knee.   ? Electrical Stimulation Action IFC at 80-150 Hz.   ? Electrical Stimulation Parameters 40% scan x 20 minutes.   ? Electrical Stimulation Goals Pain   ?  ? Vasopneumatic  ? Number Minutes Vasopneumatic  20 minutes   ? Vasopnuematic Location  --   Left knee.  ? Vasopneumatic Pressure Low   ?  ? Manual Therapy  ? Manual Therapy Joint mobilization   ? Joint Mobilization In supine:  Left patellar mobs x 5 minutes.   ? ?  ?  ? ?  ? ? ? ? ? ? ? ? ? ? ? ? ? ? ?  PT Long Term Goals - 07/31/21 1314   ? ?  ? PT LONG TERM GOAL #1  ? Title Independent with a HEP.   ? Time 4   ? Period Weeks   ? Status New   ?  ? PT LONG TERM GOAL #2  ? Title Improve left knee extension to -10 degrees.   ? Time 4   ? Period Weeks   ? Status New   ?  ? PT LONG TERM GOAL #3  ? Title Improve left knee flexion to 115 degrees.   ? Time 4   ? Period Weeks   ? Status New   ?  ? PT LONG TERM GOAL #4  ? Title Stand 15 minutes with left knee pain not > 5/10.   ? Time 4   ? Period Weeks   ? Status New   ? ?  ?  ? ?  ? ? ? ? ? ? ? ? Plan - 08/02/21 1255   ? ? Clinical Impression Statement Patient presenst to the clinic with a high left knee pain-level.  She was very palpably tender at the lateral inferior pole of her left patella.   ? Personal Factors and Comorbidities Comorbidity 1;Other;Comorbidity 2   ? Comorbidities HTN, Fibromyalgia, left elbow surgery, right TKA, h/o left knee pain and bilateral shoulder pain, left ankle surgery.   ? Examination-Activity Limitations Other;Locomotion Level;Stand   ? Examination-Participation Restrictions Other;Meal Prep   ? Stability/Clinical Decision Making  Evolving/Moderate complexity   ? Rehab Potential Good   ? PT Frequency 2x / week   ? PT Duration 6 weeks   ? PT Treatment/Interventions ADLs/Self Care Home Management;Cryotherapy;Electrical Stimulation;Iontophoresis '4mg'$ /ml Dexamethasone;Moist Heat;Ultrasound;Neuromuscular re-education;Therapeutic exercise;Therapeutic activities;Functional mobility training;Gait training;Patient/family education;Manual techniques;Passive range of motion;Vasopneumatic Device   ? PT Next Visit Plan Left knee passive range of motion, patellar mobilty, pain-free strengthening.  Modalities and STW/M as needed.  Left patellar mobility.   ? Consulted and Agree with Plan of Care Patient   ? ?  ?  ? ?  ? ? ?Patient will benefit from skilled therapeutic intervention in order to improve the following deficits and impairments:  Pain, Abnormal gait, Difficulty walking, Decreased activity tolerance, Decreased mobility, Decreased range of motion, Decreased strength ? ?Visit Diagnosis: ?Chronic pain of left knee ? ?Stiffness of left knee, not elsewhere classified ? ?Muscle weakness (generalized) ? ? ? ? ?Problem List ?Patient Active Problem List  ? Diagnosis Date Noted  ? Stasis dermatitis of both legs 12/24/2018  ? Leg pain, bilateral 12/24/2018  ? Degenerative disc disease, lumbar 12/24/2018  ? Fibromyalgia 12/24/2018  ? Vitamin D deficiency 12/24/2018  ? Class 3 severe obesity with serious comorbidity and body mass index (BMI) of 40.0 to 44.9 in adult [facility]) 07/23/2017  ? Closed fracture of right inferior pubic ramus ([place]) 11/18/2016  ? Gastroesophageal reflux disease 06/06/2016  ? Recurrent major depressive disorder, in full remission ([place]) 06/06/2016  ? DM type 2 with diabetic dyslipidemia ([place]) 03/06/2016  ? Edema 09/14/2013  ? Precordial pain 08/20/2011  ? Essential hypertension, benign   ? Hyperlipidemia   ? ? ?Ilario Dhaliwal, [place], PT ?08/02/2021, 12:57 PM ? ?[facility] ?[facility] ?[address] ?[place],  [place], [address] ?Phone: [phone]   Fax:  [phone] ? ?Name: Rashea Hoskie Schrodt ?MRN: [id number] ?Date of Birth: [birth year] ? ? ? ?

## 2021-08-07 ENCOUNTER — Encounter: Payer: Medicare Other | Admitting: Physical Therapy

## 2021-08-08 ENCOUNTER — Ambulatory Visit: Payer: Medicare Other | Admitting: Podiatry

## 2021-08-09 ENCOUNTER — Ambulatory Visit: Payer: Medicare Other | Attending: Family Medicine

## 2021-08-09 DIAGNOSIS — M25562 Pain in left knee: Secondary | ICD-10-CM | POA: Diagnosis not present

## 2021-08-09 DIAGNOSIS — M6281 Muscle weakness (generalized): Secondary | ICD-10-CM | POA: Diagnosis not present

## 2021-08-09 DIAGNOSIS — M25662 Stiffness of left knee, not elsewhere classified: Secondary | ICD-10-CM | POA: Diagnosis not present

## 2021-08-09 DIAGNOSIS — G8929 Other chronic pain: Secondary | ICD-10-CM | POA: Insufficient documentation

## 2021-08-09 NOTE — Therapy (Signed)
[place] ?[facility] ?[place] ?[place], [place], [address] ?Phone: [phone]   Fax:  [phone] ? ?Physical Therapy Treatment ? ?Patient Details  ?Name: Tiffany Brown ?MRN: [id number] ?Date of Birth: March 03, 1946 ?Referring Provider (PT): Caryl Pina MD ? ? ?Encounter Date: 08/09/2021 ? ? PT End of Session - 08/09/21 1436   ? ? Visit Number 3   ? Number of Visits 8   ? Date for PT Re-Evaluation 10/29/21   ? Authorization Type FOTO AT LEAST EVERY 5TH VISIT.  PROGRESS NOTE AT 10TH VISIT.  KX MODIFIER AFTER 15 VISITS.   12th visit FOTO and DC   ? PT Start Time 1430   ? PT Stop Time 1520   ? PT Time Calculation (min) 50 min   ? Activity Tolerance Patient tolerated treatment well   ? Behavior During Therapy [facility] for tasks assessed/performed   ? ?  ?  ? ?  ? ? ?Past Medical History:  ?Diagnosis Date  ? Abnormal glucose   ? Depression with anxiety   ? Essential hypertension, benign   ? Fibromyalgia   ? Melanoma ([place])   ? Near syncope   ? Other and unspecified hyperlipidemia   ? ? ?Past Surgical History:  ?Procedure Laterality Date  ? ABDOMINAL HYSTERECTOMY    ? BREAST SURGERY    ? LEFT  ? CHOLECYSTECTOMY    ? ELBOW SURGERY    ? X's 3 Left  ? IR ABLATE LIVER CRYOABLATION  02/15/2021  ? IR RADIOLOGIST EVAL & MGMT  02/08/2021  ? IR RADIOLOGIST EVAL & MGMT  03/14/2021  ? RIGHT DISTAL RADIUS FRACTURE    ? TOTAL KNEE ARTHROPLASTY    ? RIGHT  ? ? ?There were no vitals filed for this visit. ? ? Subjective Assessment - 08/09/21 1435   ? ? Subjective Pt arrives for today's treatment session reporting 7/10 left knee pain.   ? Pertinent History HTN, Fibromyalgia, left elbow surgery, right TKA, h/o left knee pain and bilateral shoulder pain.   ? How long can you walk comfortably? Around home with a Rollator.   ? Patient Stated Goals Get left knee replaced.   ? Currently in Pain? Yes   ? Pain Score 7    ? Pain Location Knee   ? Pain Orientation Left   ? Pain Onset More than a month ago   ? ?  ?   ? ?  ? ? ? ? ? ? ? ? ? ? ? ? ? ? ? ? ? ? ? ? [place] Adult PT Treatment/Exercise - 08/09/21 0001   ? ?  ? Knee/Hip Exercises: Aerobic  ? Nustep Lvl 3 x 15 mins   ?  ? Knee/Hip Exercises: Seated  ? Long [employer] Left;20 reps;Weights   ? Long Arc Quad Weight 2 lbs.   ? Ball Squeeze 20 reps   ? Marching Left;20 reps;Weights   ? Marching Weights 2 lbs.   ? Hamstring Curl Left;20 reps   ? Hamstring Limitations red tband   ?  ? Modalities  ? Modalities Electrical Stimulation;Vasopneumatic   ?  ? Electrical Stimulation  ? Electrical Stimulation Location left knee   ? Electrical Stimulation Action IFC at 80-150 Hz   ? Electrical Stimulation Parameters 40% scan x 15 mins   ? Electrical Stimulation Goals Pain;Tone   ?  ? Vasopneumatic  ? Number Minutes Vasopneumatic  15 minutes   ? Vasopnuematic Location  Knee   ? Vasopneumatic Pressure Low   ?  Vasopneumatic Temperature  34   ? ?  ?  ? ?  ? ? ? ? ? ? ? ? ? ? ? ? ? ? ? PT Long Term Goals - 07/31/21 1314   ? ?  ? PT LONG TERM GOAL #1  ? Title Independent with a HEP.   ? Time 4   ? Period Weeks   ? Status New   ?  ? PT LONG TERM GOAL #2  ? Title Improve left knee extension to -10 degrees.   ? Time 4   ? Period Weeks   ? Status New   ?  ? PT LONG TERM GOAL #3  ? Title Improve left knee flexion to 115 degrees.   ? Time 4   ? Period Weeks   ? Status New   ?  ? PT LONG TERM GOAL #4  ? Title Stand 15 minutes with left knee pain not > 5/10.   ? Time 4   ? Period Weeks   ? Status New   ? ?  ?  ? ?  ? ? ? ? ? ? ? ? Plan - 08/09/21 1436   ? ? Clinical Impression Statement Pt arrives for today's treamtent session reporting 7/10 left knee pain.  Pt able to tolerate warm up on Nustep for ROM, activity tolerance, and endurance.  Pt instructed in various seated exercises to increase strength, function, and safety.  Pt requiring min cues for proper technique and posture while remaining within pain free ROM.  Normal responses to estim and vaso noted upon removal.  Pt reported 5/10 left knee pain  upon completion of today's treatment session.   ? Personal Factors and Comorbidities Comorbidity 1;Other;Comorbidity 2   ? Comorbidities HTN, Fibromyalgia, left elbow surgery, right TKA, h/o left knee pain and bilateral shoulder pain, left ankle surgery.   ? Examination-Activity Limitations Other;Locomotion Level;Stand   ? Examination-Participation Restrictions Other;Meal Prep   ? Stability/Clinical Decision Making Evolving/Moderate complexity   ? Rehab Potential Good   ? PT Frequency 2x / week   ? PT Duration 6 weeks   ? PT Treatment/Interventions ADLs/Self Care Home Management;Cryotherapy;Electrical Stimulation;Iontophoresis '4mg'$ /ml Dexamethasone;Moist Heat;Ultrasound;Neuromuscular re-education;Therapeutic exercise;Therapeutic activities;Functional mobility training;Gait training;Patient/family education;Manual techniques;Passive range of motion;Vasopneumatic Device   ? PT Next Visit Plan Left knee passive range of motion, patellar mobilty, pain-free strengthening.  Modalities and STW/M as needed.  Left patellar mobility.   ? Consulted and Agree with Plan of Care Patient   ? ?  ?  ? ?  ? ? ?Patient will benefit from skilled therapeutic intervention in order to improve the following deficits and impairments:  Pain, Abnormal gait, Difficulty walking, Decreased activity tolerance, Decreased mobility, Decreased range of motion, Decreased strength ? ?Visit Diagnosis: ?Chronic pain of left knee ? ?Stiffness of left knee, not elsewhere classified ? ?Muscle weakness (generalized) ? ? ? ? ?Problem List ?Patient Active Problem List  ? Diagnosis Date Noted  ? Stasis dermatitis of both legs 12/24/2018  ? Leg pain, bilateral 12/24/2018  ? Degenerative disc disease, lumbar 12/24/2018  ? Fibromyalgia 12/24/2018  ? Vitamin D deficiency 12/24/2018  ? Class 3 severe obesity with serious comorbidity and body mass index (BMI) of 40.0 to 44.9 in adult [facility]) 07/23/2017  ? Closed fracture of right inferior pubic ramus ([place]) 11/18/2016  ?  Gastroesophageal reflux disease 06/06/2016  ? Recurrent major depressive disorder, in full remission ([place]) 06/06/2016  ? DM type 2 with diabetic dyslipidemia ([place]) 03/06/2016  ? Edema 09/14/2013  ?  Precordial pain 08/20/2011  ? Essential hypertension, benign   ? Hyperlipidemia   ? ? ?Kathrynn Ducking, PTA ?08/09/2021, 3:26 PM ? ?[place] ?[facility] ?[place] ?[place], [place], [address] ?Phone: [phone]   Fax:  [phone] ? ?Name: Jeselle Hiser Murrill ?MRN: [id number] ?Date of Birth: 1945-06-30 ? ? ? ?

## 2021-08-13 ENCOUNTER — Ambulatory Visit
Admission: RE | Admit: 2021-08-13 | Discharge: 2021-08-13 | Disposition: A | Discharge status: Home / Self Care | Payer: Medicare Other | Source: Ambulatory Visit | Attending: Family Medicine | Admitting: Family Medicine

## 2021-08-13 ENCOUNTER — Ambulatory Visit (INDEPENDENT_AMBULATORY_CARE_PROVIDER_SITE_OTHER): Payer: Medicare Other

## 2021-08-13 DIAGNOSIS — M6780 Other specified disorders of synovium and tendon, unspecified site: Secondary | ICD-10-CM

## 2021-08-13 DIAGNOSIS — Z9889 Other specified postprocedural states: Secondary | ICD-10-CM

## 2021-08-13 DIAGNOSIS — M7671 Peroneal tendinitis, right leg: Secondary | ICD-10-CM

## 2021-08-13 DIAGNOSIS — M216X1 Other acquired deformities of right foot: Secondary | ICD-10-CM

## 2021-08-13 DIAGNOSIS — S86311A Strain of muscle(s) and tendon(s) of peroneal muscle group at lower leg level, right leg, initial encounter: Secondary | ICD-10-CM

## 2021-08-13 DIAGNOSIS — Z1231 Encounter for screening mammogram for malignant neoplasm of breast: Secondary | ICD-10-CM | POA: Diagnosis not present

## 2021-08-13 NOTE — Progress Notes (Signed)
SITUATION ?Reason for Visit: Dispensation and Fitting of Custom Molded Gauntlet ?Patient Report: Patient reports comfort in ambulation and understands all instructions. ? ?OBJECTIVE DATA ?Patient History / Diagnosis:   ?  ICD-10-CM   ?1. Peroneal tendinitis, right  M76.71   ?  ?2. Status post foot surgery  Z98.890   ?  ?3. Tendon weakness  M67.80   ?  ?4. Peroneal tendon tear, right, initial encounter  S86.311A   ?  ?5. Cavovarus deformity of foot, acquired, right  M21.6X1   ?  ? ? ?Provided Device:  Custom Molded Gauntlet: Style [place] Regular  ?    Production Model [id number] ? ?Goals of Orthosis: ?- Improve gait ?- Decrease energy expenditure during the gait cycle ?- Improve balance ?- Stabilize motion at ankle and subtalar joint ?- Compensate for muscle weakness ?- Facilitate motion ?- Provide triplanar ankle and foot stabilization for weight bearing activities ? ?Device Justification: ?- Patient is ambulatory  ?- Device is medically necessary as part of the overall treatment due to the patient's condition and related symptoms ?- It is anticipated that the patient will benefit functionally with use of the device.  ?- The custom device is utilized in an attempt to avoid the need for surgery and because a prefabricated device is inappropriate. ? ?Upon gait analysis, the device appeared to be fitting well and the patient states that the device is comfortable. ? ?ACTIONS PERFORMED ?Patient was fit with Dance movement psychotherapist. Patient tolerated fitting procedure. Fit of the device is good. Patient was able to apply properly and ambulate without distress. Device function is to restrict and limit motion and provide stabilization in the ankle joint.  ? ?Goals and function of this device were explained in detail to the patient. The patient was shown how to properly apply, wear, and care for the device. It was explained that the device will fit and function best in an adjustable-closure shoe with a firm heel counter  and a wide base of support. When the device was dispensed, it was suitable for the patient's condition and not substandard. No guarantees were given. Precautions were reviewed.  ? ?Written instructions, warranty information, and a copy of DMEPOS Supplier Standards were provided. All questions answered and concerns addressed. ? ?PLAN ?Patient is to follow up in one week or as necessary (PRN). Plan of care was discussed with and agreed upon by patient. ? ? ?

## 2021-08-14 ENCOUNTER — Ambulatory Visit: Payer: Medicare Other | Admitting: *Deleted

## 2021-08-14 DIAGNOSIS — M25562 Pain in left knee: Secondary | ICD-10-CM | POA: Diagnosis not present

## 2021-08-14 DIAGNOSIS — M6281 Muscle weakness (generalized): Secondary | ICD-10-CM | POA: Diagnosis not present

## 2021-08-14 DIAGNOSIS — M25662 Stiffness of left knee, not elsewhere classified: Secondary | ICD-10-CM

## 2021-08-14 DIAGNOSIS — G8929 Other chronic pain: Secondary | ICD-10-CM

## 2021-08-14 NOTE — Therapy (Signed)
[place] ?[facility] ?[place] ?[place], [place], [address] ?Phone: [phone]   Fax:  [phone] ? ?Physical Therapy Treatment ? ?Patient Details  ?Name: Tiffany Brown ?MRN: [id number] ?Date of Birth: 04-07-1946 ?Referring Provider (PT): Tiffany Pina MD ? ? ?Encounter Date: 08/14/2021 ? ? PT End of Session - 08/14/21 1044   ? ? Visit Number 4   ? Number of Visits 8   ? Date for PT Re-Evaluation 10/29/21   ? Authorization Type FOTO AT LEAST EVERY 5TH VISIT.  PROGRESS NOTE AT 10TH VISIT.  KX MODIFIER AFTER 15 VISITS.   ? PT Start Time 1030   ? PT Stop Time 1128   ? PT Time Calculation (min) 58 min   ? ?  ?  ? ?  ? ? ?Past Medical History:  ?Diagnosis Date  ? Abnormal glucose   ? Depression with anxiety   ? Essential hypertension, benign   ? Fibromyalgia   ? Melanoma ([place])   ? Near syncope   ? Other and unspecified hyperlipidemia   ? ? ?Past Surgical History:  ?Procedure Laterality Date  ? ABDOMINAL HYSTERECTOMY    ? BREAST SURGERY    ? LEFT  ? CHOLECYSTECTOMY    ? ELBOW SURGERY    ? X's 3 Left  ? IR ABLATE LIVER CRYOABLATION  02/15/2021  ? IR RADIOLOGIST EVAL & MGMT  02/08/2021  ? IR RADIOLOGIST EVAL & MGMT  03/14/2021  ? RIGHT DISTAL RADIUS FRACTURE    ? TOTAL KNEE ARTHROPLASTY    ? RIGHT  ? ? ?There were no vitals filed for this visit. ? ? Subjective Assessment - 08/14/21 1046   ? ? Subjective Pt arrives for today's treatment session reporting 6/10 left knee pain.  I've got to get LT knee stronger   ? Pertinent History HTN, Fibromyalgia, left elbow surgery, right TKA, h/o left knee pain and bilateral shoulder pain.   ? How long can you walk comfortably? Around home with a Rollator.   ? Patient Stated Goals Get left knee replaced.   ? Currently in Pain? Yes   ? Pain Score 6    ? Pain Location Knee   ? ?  ?  ? ?  ? ? ? ? ? ? ? ? ? ? ? ? ? ? ? ? ? ? ? ? [place] Adult PT Treatment/Exercise - 08/14/21 0001   ? ?  ? Knee/Hip Exercises: Aerobic  ? Nustep Lvl 3 x 15 mins   ?  ?  Knee/Hip Exercises: Seated  ? Long Arc [employer] reps;Weights   pause at top 3 secs  ? Long Arc Quad Weight 5 lbs.   ? Ball Squeeze 10 reps   hold 10 secs  ? Hamstring Curl Left;20 reps   2x20  ? Hamstring Limitations red tband   ?  ? Modalities  ? Modalities Electrical Stimulation;Vasopneumatic   ?  ? Electrical Stimulation  ? Electrical Stimulation Location left knee   ? Electrical Stimulation Action IFC 80-'150hz'$    ? Electrical Stimulation Parameters x61mns   ? Electrical Stimulation Goals Pain;Tone   ?  ? Vasopneumatic  ? Number Minutes Vasopneumatic  15 minutes   ? Vasopnuematic Location  Knee   ? Vasopneumatic Pressure Low   ? Vasopneumatic Temperature  34   ? ?  ?  ? ?  ? ? ? ? ? ? ? ? ? ? ? ? ? ? ? PT Long Term Goals - 07/31/21 1314   ? ?  ?  PT LONG TERM GOAL #1  ? Title Independent with a HEP.   ? Time 4   ? Period Weeks   ? Status New   ?  ? PT LONG TERM GOAL #2  ? Title Improve left knee extension to -10 degrees.   ? Time 4   ? Period Weeks   ? Status New   ?  ? PT LONG TERM GOAL #3  ? Title Improve left knee flexion to 115 degrees.   ? Time 4   ? Period Weeks   ? Status New   ?  ? PT LONG TERM GOAL #4  ? Title Stand 15 minutes with left knee pain not > 5/10.   ? Time 4   ? Period Weeks   ? Status New   ? ?  ?  ? ?  ? ? ? ? ? ? ? ? Plan - 08/14/21 1045   ? ? Clinical Impression Statement Pt arrived today doing fair, but still with LT knee pain. Rx focused on strengthening LT LE and Pt was able to progress with some of her exs. Mainly [facility] due to RT ankle a nd LT knee pain. Estim and vaso end of session for pain cntrol   ? Personal Factors and Comorbidities Comorbidity 1;Other;Comorbidity 2   ? Comorbidities HTN, Fibromyalgia, left elbow surgery, right TKA, h/o left knee pain and bilateral shoulder pain, left ankle surgery.   ? Examination-Activity Limitations Other;Locomotion Level;Stand   ? Examination-Participation Restrictions Other;Meal Prep   ? Stability/Clinical Decision Making Evolving/Moderate  complexity   ? Rehab Potential Good   ? PT Frequency 2x / week   ? PT Duration 6 weeks   ? PT Treatment/Interventions ADLs/Self Care Home Management;Cryotherapy;Electrical Stimulation;Iontophoresis '4mg'$ /ml Dexamethasone;Moist Heat;Ultrasound;Neuromuscular re-education;Therapeutic exercise;Therapeutic activities;Functional mobility training;Gait training;Patient/family education;Manual techniques;Passive range of motion;Vasopneumatic Device   ? PT Next Visit Plan Left knee passive range of motion, patellar mobilty, pain-free strengthening.  Modalities and STW/M as needed.  Left patellar mobility.   ? ?  ?  ? ?  ? ? ?Patient will benefit from skilled therapeutic intervention in order to improve the following deficits and impairments:  Pain, Abnormal gait, Difficulty walking, Decreased activity tolerance, Decreased mobility, Decreased range of motion, Decreased strength ? ?Visit Diagnosis: ?Chronic pain of left knee ? ?Stiffness of left knee, not elsewhere classified ? ?Muscle weakness (generalized) ? ? ? ? ?Problem List ?Patient Active Problem List  ? Diagnosis Date Noted  ? Stasis dermatitis of both legs 12/24/2018  ? Leg pain, bilateral 12/24/2018  ? Degenerative disc disease, lumbar 12/24/2018  ? Fibromyalgia 12/24/2018  ? Vitamin D deficiency 12/24/2018  ? Class 3 severe obesity with serious comorbidity and body mass index (BMI) of 40.0 to 44.9 in adult [facility]) 07/23/2017  ? Closed fracture of right inferior pubic ramus ([place]) 11/18/2016  ? Gastroesophageal reflux disease 06/06/2016  ? Recurrent major depressive disorder, in full remission ([place]) 06/06/2016  ? DM type 2 with diabetic dyslipidemia ([place]) 03/06/2016  ? Edema 09/14/2013  ? Precordial pain 08/20/2011  ? Essential hypertension, benign   ? Hyperlipidemia   ? ? ?Tiffany Brown,Tiffany Brown, Tiffany Brown ?08/14/2021, 4:21 PM ? ?[place] ?[facility] ?[place] ?[place], [place], [address] ?Phone: [phone]   Fax:  [phone] ? ?Name: Tiffany Lye  Brown ?MRN: [id number] ?Date of Birth: Dec 21, 1945 ? ? ? ?

## 2021-08-15 ENCOUNTER — Telehealth: Payer: Self-pay | Admitting: Physical Therapy

## 2021-08-15 ENCOUNTER — Ambulatory Visit: Payer: Medicare Other | Admitting: Physical Therapy

## 2021-08-15 NOTE — Telephone Encounter (Signed)
Cx today I am in too much pain and r/s me for Tuesday ?

## 2021-08-21 ENCOUNTER — Encounter: Payer: Self-pay | Admitting: Physical Therapy

## 2021-08-21 ENCOUNTER — Ambulatory Visit: Payer: Medicare Other | Admitting: Physical Therapy

## 2021-08-21 DIAGNOSIS — G8929 Other chronic pain: Secondary | ICD-10-CM | POA: Diagnosis not present

## 2021-08-21 DIAGNOSIS — M6281 Muscle weakness (generalized): Secondary | ICD-10-CM

## 2021-08-21 DIAGNOSIS — M25662 Stiffness of left knee, not elsewhere classified: Secondary | ICD-10-CM

## 2021-08-21 DIAGNOSIS — M25562 Pain in left knee: Secondary | ICD-10-CM | POA: Diagnosis not present

## 2021-08-21 NOTE — Therapy (Signed)
[place] ?[facility] ?[place] ?[place], [place], [address] ?Phone: [phone]   Fax:  [phone] ? ?Physical Therapy Treatment ? ?Patient Details  ?Name: Tiffany Brown ?MRN: [id number] ?Date of Birth: 08-08-1945 ?Referring Provider (PT): Caryl Pina MD ? ? ?Encounter Date: 08/21/2021 ? ? PT End of Session - 08/21/21 1124   ? ? Visit Number 5   ? Number of Visits 8   ? Date for PT Re-Evaluation 10/29/21   ? Authorization Type FOTO AT LEAST EVERY 5TH VISIT.  PROGRESS NOTE AT 10TH VISIT.  KX MODIFIER AFTER 15 VISITS.   ? PT Start Time 1118   ? PT Stop Time 1205   ? PT Time Calculation (min) 47 min   ? Activity Tolerance Patient tolerated treatment well   ? Behavior During Therapy [facility] for tasks assessed/performed   ? ?  ?  ? ?  ? ? ?Past Medical History:  ?Diagnosis Date  ? Abnormal glucose   ? Depression with anxiety   ? Essential hypertension, benign   ? Fibromyalgia   ? Melanoma ([place])   ? Near syncope   ? Other and unspecified hyperlipidemia   ? ? ?Past Surgical History:  ?Procedure Laterality Date  ? ABDOMINAL HYSTERECTOMY    ? BREAST SURGERY    ? LEFT  ? CHOLECYSTECTOMY    ? ELBOW SURGERY    ? X's 3 Left  ? IR ABLATE LIVER CRYOABLATION  02/15/2021  ? IR RADIOLOGIST EVAL & MGMT  02/08/2021  ? IR RADIOLOGIST EVAL & MGMT  03/14/2021  ? RIGHT DISTAL RADIUS FRACTURE    ? TOTAL KNEE ARTHROPLASTY    ? RIGHT  ? ? ?There were no vitals filed for this visit. ? ? Subjective Assessment - 08/21/21 1123   ? ? Subjective Having a lot of pain. Cannot use R ankle brace as she cannot find a shoe that will fit the brace.   ? Pertinent History HTN, Fibromyalgia, left elbow surgery, right TKA, h/o left knee pain and bilateral shoulder pain.   ? How long can you walk comfortably? Around home with a Rollator.   ? Patient Stated Goals Get left knee replaced.   ? Currently in Pain? Yes   ? Pain Score 7    ? Pain Location Knee   ? Pain Orientation Left   ? Pain Descriptors / Indicators Sharp   ?  Pain Type Chronic pain   ? Pain Onset More than a month ago   ? Pain Frequency Constant   ? ?  ?  ? ?  ? ? ? ? ? [facility] PT Assessment - 08/21/21 0001   ? ?  ? Assessment  ? Medical Diagnosis Primary OA of left knee.   ? Referring Provider (PT) Caryl Pina MD   ?  ? Precautions  ? Precautions Fall   ? Precaution Comments Patient requires a Rollator at all times.   ?  ? Restrictions  ? Weight Bearing Restrictions No   ? ?  ?  ? ?  ? ? ? ? ? ? ? ? ? ? ? ? ? ? ? ? [place] Adult PT Treatment/Exercise - 08/21/21 0001   ? ?  ? Knee/Hip Exercises: Aerobic  ? Nustep L3x16 min   ?  ? Knee/Hip Exercises: Seated  ? Long [employer] Left;20 reps;Weights   ? Long Arc Quad Weight 3 lbs.   ? [employer] x20 reps   ? Clamshell with TheraBand Red   x20 reps  ?  Hamstring Curl Left;20 reps   ? Hamstring Limitations red tband   ?  ? Modalities  ? Modalities Electrical Stimulation;Vasopneumatic   ?  ? Electrical Stimulation  ? Electrical Stimulation Location L knee   ? Electrical Stimulation Action IFC   ? Electrical Stimulation Parameters 80-150 hz x15 min   ? Electrical Stimulation Goals Pain;Tone   ?  ? Vasopneumatic  ? Number Minutes Vasopneumatic  15 minutes   ? Vasopnuematic Location  Knee   ? Vasopneumatic Pressure Low   ? Vasopneumatic Temperature  34   ? ?  ?  ? ?  ? ? ? ? ? ? ? ? ? ? ? ? ? ? ? PT Long Term Goals - 07/31/21 1314   ? ?  ? PT LONG TERM GOAL #1  ? Title Independent with a HEP.   ? Time 4   ? Period Weeks   ? Status New   ?  ? PT LONG TERM GOAL #2  ? Title Improve left knee extension to -10 degrees.   ? Time 4   ? Period Weeks   ? Status New   ?  ? PT LONG TERM GOAL #3  ? Title Improve left knee flexion to 115 degrees.   ? Time 4   ? Period Weeks   ? Status New   ?  ? PT LONG TERM GOAL #4  ? Title Stand 15 minutes with left knee pain not > 5/10.   ? Time 4   ? Period Weeks   ? Status New   ? ?  ?  ? ?  ? ? ? ? ? ? ? ? Plan - 08/21/21 1157   ? ? Clinical Impression Statement Patient presented in clinic in considerable  pain due to L knee. Patient uses max UE support with rollator or other items in surrounds to ambulate due to fear and pain. Patient able to tolerate therex fairly well with pain but still having issues as well with R ankle and unable to wear R ankle brace due to shoe. Normal modalities response noted following removal of the modalities.   ? Personal Factors and Comorbidities Comorbidity 1;Other;Comorbidity 2   ? Comorbidities HTN, Fibromyalgia, left elbow surgery, right TKA, h/o left knee pain and bilateral shoulder pain, left ankle surgery.   ? Examination-Activity Limitations Other;Locomotion Level;Stand   ? Examination-Participation Restrictions Other;Meal Prep   ? Stability/Clinical Decision Making Evolving/Moderate complexity   ? Rehab Potential Good   ? PT Frequency 2x / week   ? PT Duration 6 weeks   ? PT Treatment/Interventions ADLs/Self Care Home Management;Cryotherapy;Electrical Stimulation;Iontophoresis '4mg'$ /ml Dexamethasone;Moist Heat;Ultrasound;Neuromuscular re-education;Therapeutic exercise;Therapeutic activities;Functional mobility training;Gait training;Patient/family education;Manual techniques;Passive range of motion;Vasopneumatic Device   ? PT Next Visit Plan Left knee passive range of motion, patellar mobilty, pain-free strengthening.  Modalities and STW/M as needed.  Left patellar mobility.   ? Consulted and Agree with Plan of Care Patient   ? ?  ?  ? ?  ? ? ?Patient will benefit from skilled therapeutic intervention in order to improve the following deficits and impairments:  Pain, Abnormal gait, Difficulty walking, Decreased activity tolerance, Decreased mobility, Decreased range of motion, Decreased strength ? ?Visit Diagnosis: ?Chronic pain of left knee ? ?Stiffness of left knee, not elsewhere classified ? ?Muscle weakness (generalized) ? ? ? ? ?Problem List ?Patient Active Problem List  ? Diagnosis Date Noted  ? Stasis dermatitis of both legs 12/24/2018  ? Leg pain, bilateral 12/24/2018  ?  Degenerative  disc disease, lumbar 12/24/2018  ? Fibromyalgia 12/24/2018  ? Vitamin D deficiency 12/24/2018  ? Class 3 severe obesity with serious comorbidity and body mass index (BMI) of 40.0 to 44.9 in adult [facility]) 07/23/2017  ? Closed fracture of right inferior pubic ramus ([place]) 11/18/2016  ? Gastroesophageal reflux disease 06/06/2016  ? Recurrent major depressive disorder, in full remission ([place]) 06/06/2016  ? DM type 2 with diabetic dyslipidemia ([place]) 03/06/2016  ? Edema 09/14/2013  ? Precordial pain 08/20/2011  ? Essential hypertension, benign   ? Hyperlipidemia   ? ? ?Standley Brooking, PTA ?08/21/2021, 12:11 PM ? ?[place] ?[facility] ?[address] ?[place], [place], [address] ?Phone: [phone]   Fax:  [phone] ? ?Name: Tiffany Brown ?MRN: [id number] ?Date of Birth: [birth year] ? ? ? ?

## 2021-08-24 DIAGNOSIS — M545 Low back pain, unspecified: Secondary | ICD-10-CM | POA: Diagnosis not present

## 2021-08-28 ENCOUNTER — Ambulatory Visit: Payer: Medicare Other | Admitting: Physical Therapy

## 2021-08-28 ENCOUNTER — Encounter: Payer: Self-pay | Admitting: Physical Therapy

## 2021-08-28 DIAGNOSIS — M25662 Stiffness of left knee, not elsewhere classified: Secondary | ICD-10-CM

## 2021-08-28 DIAGNOSIS — G8929 Other chronic pain: Secondary | ICD-10-CM | POA: Diagnosis not present

## 2021-08-28 DIAGNOSIS — M6281 Muscle weakness (generalized): Secondary | ICD-10-CM | POA: Diagnosis not present

## 2021-08-28 DIAGNOSIS — M25562 Pain in left knee: Secondary | ICD-10-CM | POA: Diagnosis not present

## 2021-08-28 NOTE — Therapy (Signed)
[place] ?[facility] ?[place] ?[place], [place], [address] ?Phone: [phone]   Fax:  [phone] ? ?Physical Therapy Treatment ? ?Patient Details  ?Name: Tiffany Brown ?MRN: [id number] ?Date of Birth: 09/12/1945 ?Referring Provider (PT): Caryl Pina MD ? ? ?Encounter Date: 08/28/2021 ? ? PT End of Session - 08/28/21 1122   ? ? Visit Number 6   ? Number of Visits 8   ? Date for PT Re-Evaluation 10/29/21   ? Authorization Type FOTO AT LEAST EVERY 5TH VISIT.  PROGRESS NOTE AT 10TH VISIT.  KX MODIFIER AFTER 15 VISITS.   ? PT Start Time 1118   ? PT Stop Time 1202   ? PT Time Calculation (min) 44 min   ? Activity Tolerance Patient tolerated treatment well   ? Behavior During Therapy [facility] for tasks assessed/performed   ? ?  ?  ? ?  ? ? ?Past Medical History:  ?Diagnosis Date  ? Abnormal glucose   ? Depression with anxiety   ? Essential hypertension, benign   ? Fibromyalgia   ? Melanoma ([place])   ? Near syncope   ? Other and unspecified hyperlipidemia   ? ? ?Past Surgical History:  ?Procedure Laterality Date  ? ABDOMINAL HYSTERECTOMY    ? BREAST SURGERY    ? LEFT  ? CHOLECYSTECTOMY    ? ELBOW SURGERY    ? X's 3 Left  ? IR ABLATE LIVER CRYOABLATION  02/15/2021  ? IR RADIOLOGIST EVAL & MGMT  02/08/2021  ? IR RADIOLOGIST EVAL & MGMT  03/14/2021  ? RIGHT DISTAL RADIUS FRACTURE    ? TOTAL KNEE ARTHROPLASTY    ? RIGHT  ? ? ?There were no vitals filed for this visit. ? ? Subjective Assessment - 08/28/21 1121   ? ? Subjective Feels pretty good today.   ? Pertinent History HTN, Fibromyalgia, left elbow surgery, right TKA, h/o left knee pain and bilateral shoulder pain.   ? How long can you walk comfortably? Around home with a Rollator.   ? Patient Stated Goals Get left knee replaced.   ? Currently in Pain? Yes   ? Pain Score 6    ? Pain Location Knee   ? Pain Orientation Left   ? Pain Descriptors / Indicators Discomfort   ? Pain Type Chronic pain   ? Pain Onset More than a month ago   ?  Pain Frequency Constant   ? ?  ?  ? ?  ? ? ? ? ? [facility] PT Assessment - 08/28/21 0001   ? ?  ? Assessment  ? Medical Diagnosis Primary OA of left knee.   ? Referring Provider (PT) Caryl Pina MD   ?  ? Precautions  ? Precautions Fall   ? Precaution Comments Patient requires a Rollator at all times.   ? ?  ?  ? ?  ? ? ? ? ? ? ? ? ? ? ? ? ? ? ? ? [place] Adult PT Treatment/Exercise - 08/28/21 0001   ? ?  ? Knee/Hip Exercises: Aerobic  ? Nustep L3x15 min   ?  ? Knee/Hip Exercises: Seated  ? Long [employer] Left;20 reps;Weights   ? Long Arc Quad Weight 3 lbs.   ? [employer] x20 reps   ? Clamshell with TheraBand Red   x30 reps  ? Hamstring Curl Left;20 reps   ? Hamstring Limitations red tband   ?  ? Modalities  ? Modalities Electrical Stimulation;Vasopneumatic   ?  ?  Electrical Stimulation  ? Electrical Stimulation Location L knee   ? Electrical Stimulation Action IFC   ? Electrical Stimulation Parameters 80-150 hz x10 min   ? Electrical Stimulation Goals Pain;Tone   ?  ? Vasopneumatic  ? Number Minutes Vasopneumatic  10 minutes   ? Vasopnuematic Location  Knee   ? Vasopneumatic Pressure Low   ? Vasopneumatic Temperature  34   ? ?  ?  ? ?  ? ? ? ? ? ? ? ? ? ? ? ? ? ? ? PT Long Term Goals - 07/31/21 1314   ? ?  ? PT LONG TERM GOAL #1  ? Title Independent with a HEP.   ? Time 4   ? Period Weeks   ? Status New   ?  ? PT LONG TERM GOAL #2  ? Title Improve left knee extension to -10 degrees.   ? Time 4   ? Period Weeks   ? Status New   ?  ? PT LONG TERM GOAL #3  ? Title Improve left knee flexion to 115 degrees.   ? Time 4   ? Period Weeks   ? Status New   ?  ? PT LONG TERM GOAL #4  ? Title Stand 15 minutes with left knee pain not > 5/10.   ? Time 4   ? Period Weeks   ? Status New   ? ?  ?  ? ?  ? ? ? ? ? ? ? ? Plan - 08/28/21 1207   ? ? Clinical Impression Statement Patient presented in clinic with reports of less L knee pain. Patient still limited with mobility due to L knee and R ankle. Patient instructed for more paced LAQ  with holds at end range. Normal modalities response noted following removal of the modalities.   ? Personal Factors and Comorbidities Comorbidity 1;Other;Comorbidity 2   ? Comorbidities HTN, Fibromyalgia, left elbow surgery, right TKA, h/o left knee pain and bilateral shoulder pain, left ankle surgery.   ? Examination-Activity Limitations Other;Locomotion Level;Stand   ? Examination-Participation Restrictions Other;Meal Prep   ? Stability/Clinical Decision Making Evolving/Moderate complexity   ? Rehab Potential Good   ? PT Frequency 2x / week   ? PT Duration 6 weeks   ? PT Treatment/Interventions ADLs/Self Care Home Management;Cryotherapy;Electrical Stimulation;Iontophoresis '4mg'$ /ml Dexamethasone;Moist Heat;Ultrasound;Neuromuscular re-education;Therapeutic exercise;Therapeutic activities;Functional mobility training;Gait training;Patient/family education;Manual techniques;Passive range of motion;Vasopneumatic Device   ? PT Next Visit Plan Left knee passive range of motion, patellar mobilty, pain-free strengthening.  Modalities and STW/M as needed.  Left patellar mobility.   ? Consulted and Agree with Plan of Care Patient   ? ?  ?  ? ?  ? ? ?Patient will benefit from skilled therapeutic intervention in order to improve the following deficits and impairments:  Pain, Abnormal gait, Difficulty walking, Decreased activity tolerance, Decreased mobility, Decreased range of motion, Decreased strength ? ?Visit Diagnosis: ?Chronic pain of left knee ? ?Stiffness of left knee, not elsewhere classified ? ?Muscle weakness (generalized) ? ? ? ? ?Problem List ?Patient Active Problem List  ? Diagnosis Date Noted  ? Stasis dermatitis of both legs 12/24/2018  ? Leg pain, bilateral 12/24/2018  ? Degenerative disc disease, lumbar 12/24/2018  ? Fibromyalgia 12/24/2018  ? Vitamin D deficiency 12/24/2018  ? Class 3 severe obesity with serious comorbidity and body mass index (BMI) of 40.0 to 44.9 in adult [facility]) 07/23/2017  ? Closed fracture  of right inferior pubic ramus ([place]) 11/18/2016  ? Gastroesophageal reflux  disease 06/06/2016  ? Recurrent major depressive disorder, in full remission ([place]) 06/06/2016  ? DM type 2 with diabetic dyslipidemia ([place]) 03/06/2016  ? Edema 09/14/2013  ? Precordial pain 08/20/2011  ? Essential hypertension, benign   ? Hyperlipidemia   ? ? ?Standley Brooking, PTA ?08/28/2021, 12:11 PM ? ?[place] ?[facility] ?[place] ?[place], [place], [address] ?Phone: [phone]   Fax:  [phone] ? ?Name: Tiffany Brown ?MRN: [id number] ?Date of Birth: 29-May-1945 ? ? ? ?

## 2021-08-30 ENCOUNTER — Ambulatory Visit: Payer: Medicare Other | Admitting: Physical Therapy

## 2021-08-30 ENCOUNTER — Encounter: Payer: Self-pay | Admitting: Physical Therapy

## 2021-08-30 DIAGNOSIS — M25662 Stiffness of left knee, not elsewhere classified: Secondary | ICD-10-CM

## 2021-08-30 DIAGNOSIS — G8929 Other chronic pain: Secondary | ICD-10-CM

## 2021-08-30 DIAGNOSIS — M6281 Muscle weakness (generalized): Secondary | ICD-10-CM

## 2021-08-30 DIAGNOSIS — M25562 Pain in left knee: Secondary | ICD-10-CM | POA: Diagnosis not present

## 2021-08-30 NOTE — Therapy (Signed)
[place] ?[facility] ?[place] ?[place], [place], [address] ?Phone: [phone]   Fax:  [phone] ? ?Physical Therapy Treatment ? ?Patient Details  ?Name: Tiffany Brown ?MRN: [id number] ?Date of Birth: 09-Dec-1945 ?Referring Provider (PT): Caryl Pina MD ? ? ?Encounter Date: 08/30/2021 ? ? PT End of Session - 08/30/21 1037   ? ? Visit Number 7   ? Number of Visits 8   ? Date for PT Re-Evaluation 10/29/21   ? Authorization Type FOTO AT LEAST EVERY 5TH VISIT.  PROGRESS NOTE AT 10TH VISIT.  KX MODIFIER AFTER 15 VISITS.   ? PT Start Time 1032   ? PT Stop Time 1120   ? PT Time Calculation (min) 48 min   ? Activity Tolerance Patient tolerated treatment well   ? Behavior During Therapy [facility] for tasks assessed/performed   ? ?  ?  ? ?  ? ? ?Past Medical History:  ?Diagnosis Date  ? Abnormal glucose   ? Depression with anxiety   ? Essential hypertension, benign   ? Fibromyalgia   ? Melanoma ([place])   ? Near syncope   ? Other and unspecified hyperlipidemia   ? ? ?Past Surgical History:  ?Procedure Laterality Date  ? ABDOMINAL HYSTERECTOMY    ? BREAST SURGERY    ? LEFT  ? CHOLECYSTECTOMY    ? ELBOW SURGERY    ? X's 3 Left  ? IR ABLATE LIVER CRYOABLATION  02/15/2021  ? IR RADIOLOGIST EVAL & MGMT  02/08/2021  ? IR RADIOLOGIST EVAL & MGMT  03/14/2021  ? RIGHT DISTAL RADIUS FRACTURE    ? TOTAL KNEE ARTHROPLASTY    ? RIGHT  ? ? ?There were no vitals filed for this visit. ? ? Subjective Assessment - 08/30/21 1029   ? ? Subjective Feels pretty good today.   ? Pertinent History HTN, Fibromyalgia, left elbow surgery, right TKA, h/o left knee pain and bilateral shoulder pain.   ? How long can you walk comfortably? Around home with a Rollator.   ? Patient Stated Goals Get left knee replaced.   ? Currently in Pain? Yes   ? Pain Score 5    ? Pain Location Knee   ? Pain Orientation Left   ? Pain Descriptors / Indicators Discomfort   ? Pain Type Chronic pain   ? Pain Onset More than a month ago   ?  Pain Frequency Constant   ? ?  ?  ? ?  ? ? ? ? ? [facility] PT Assessment - 08/30/21 0001   ? ?  ? Assessment  ? Medical Diagnosis Primary OA of left knee.   ? Referring Provider (PT) Caryl Pina MD   ?  ? Precautions  ? Precautions Fall   ? Precaution Comments Patient requires a Rollator at all times.   ? ?  ?  ? ?  ? ? ? ? ? ? ? ? ? ? ? ? ? ? ? ? [place] Adult PT Treatment/Exercise - 08/30/21 0001   ? ?  ? Knee/Hip Exercises: Aerobic  ? Nustep L3x20 min   ?  ? Knee/Hip Exercises: Seated  ? Long [employer] Left;20 reps;Weights   ? Long Arc Quad Weight 4 lbs.   ? [employer] x20 reps   ? Clamshell with Marga Hoots   x20 reps  ? Marching Strengthening;Left;20 reps;Weights   ? Marching Weights 4 lbs.   ? Hamstring Curl Left;20 reps   ? Hamstring Limitations green theraband   ?  ?  Modalities  ? Modalities Electrical Stimulation;Vasopneumatic   ?  ? Electrical Stimulation  ? Electrical Stimulation Location L knee   ? Electrical Stimulation Action IFC   ? Electrical Stimulation Parameters 80-150 hz x10 min   ? Electrical Stimulation Goals Pain;Tone   ?  ? Vasopneumatic  ? Number Minutes Vasopneumatic  10 minutes   ? Vasopnuematic Location  Knee   ? Vasopneumatic Pressure Low   ? Vasopneumatic Temperature  34   ? ?  ?  ? ?  ? ? ? ? ? ? ? ? ? ? ? ? ? ? ? PT Long Term Goals - 07/31/21 1314   ? ?  ? PT LONG TERM GOAL #1  ? Title Independent with a HEP.   ? Time 4   ? Period Weeks   ? Status New   ?  ? PT LONG TERM GOAL #2  ? Title Improve left knee extension to -10 degrees.   ? Time 4   ? Period Weeks   ? Status New   ?  ? PT LONG TERM GOAL #3  ? Title Improve left knee flexion to 115 degrees.   ? Time 4   ? Period Weeks   ? Status New   ?  ? PT LONG TERM GOAL #4  ? Title Stand 15 minutes with left knee pain not > 5/10.   ? Time 4   ? Period Weeks   ? Status New   ? ?  ?  ? ?  ? ? ? ? ? ? ? ? Plan - 08/30/21 1124   ? ? Clinical Impression Statement Patient presented in clinic with reports of moderate L knee pain. Patient  progressed with resistance today and no pain reported by patient. Patient still very cautious with gait and transfers. All modalites completed in sitting. Normal modalities response noted following removal of the modalities.   ? Personal Factors and Comorbidities Comorbidity 1;Other;Comorbidity 2   ? Comorbidities HTN, Fibromyalgia, left elbow surgery, right TKA, h/o left knee pain and bilateral shoulder pain, left ankle surgery.   ? Examination-Activity Limitations Other;Locomotion Level;Stand   ? Examination-Participation Restrictions Other;Meal Prep   ? Stability/Clinical Decision Making Evolving/Moderate complexity   ? Rehab Potential Good   ? PT Frequency 2x / week   ? PT Duration 6 weeks   ? PT Treatment/Interventions ADLs/Self Care Home Management;Cryotherapy;Electrical Stimulation;Iontophoresis '4mg'$ /ml Dexamethasone;Moist Heat;Ultrasound;Neuromuscular re-education;Therapeutic exercise;Therapeutic activities;Functional mobility training;Gait training;Patient/family education;Manual techniques;Passive range of motion;Vasopneumatic Device   ? PT Next Visit Plan Left knee passive range of motion, patellar mobilty, pain-free strengthening.  Modalities and STW/M as needed.  Left patellar mobility.   ? Consulted and Agree with Plan of Care Patient   ? ?  ?  ? ?  ? ? ?Patient will benefit from skilled therapeutic intervention in order to improve the following deficits and impairments:  Pain, Abnormal gait, Difficulty walking, Decreased activity tolerance, Decreased mobility, Decreased range of motion, Decreased strength ? ?Visit Diagnosis: ?Chronic pain of left knee ? ?Stiffness of left knee, not elsewhere classified ? ?Muscle weakness (generalized) ? ? ? ? ?Problem List ?Patient Active Problem List  ? Diagnosis Date Noted  ? Stasis dermatitis of both legs 12/24/2018  ? Leg pain, bilateral 12/24/2018  ? Degenerative disc disease, lumbar 12/24/2018  ? Fibromyalgia 12/24/2018  ? Vitamin D deficiency 12/24/2018  ? Class  3 severe obesity with serious comorbidity and body mass index (BMI) of 40.0 to 44.9 in adult [facility]) 07/23/2017  ? Closed  fracture of right inferior pubic ramus ([place]) 11/18/2016  ? Gastroesophageal reflux disease 06/06/2016  ? Recurrent major depressive disorder, in full remission ([place]) 06/06/2016  ? DM type 2 with diabetic dyslipidemia ([place]) 03/06/2016  ? Edema 09/14/2013  ? Precordial pain 08/20/2011  ? Essential hypertension, benign   ? Hyperlipidemia   ? ? ?Standley Brooking, PTA ?08/30/2021, 11:36 AM ? ?[place] ?[facility] ?[place] ?[place], [place], [address] ?Phone: [phone]   Fax:  [phone] ? ?Name: Tiffany Brown ?MRN: [id number] ?Date of Birth: September 02, 1945 ? ? ? ?

## 2021-08-31 ENCOUNTER — Ambulatory Visit: Payer: Medicare Other | Admitting: Podiatry

## 2021-08-31 DIAGNOSIS — M7671 Peroneal tendinitis, right leg: Secondary | ICD-10-CM

## 2021-08-31 DIAGNOSIS — M6780 Other specified disorders of synovium and tendon, unspecified site: Secondary | ICD-10-CM | POA: Diagnosis not present

## 2021-08-31 MED ORDER — IBUPROFEN 800 MG PO TABS: 800.0000 mg | ORAL_TABLET | Freq: Four times a day (QID) | ORAL | 1 refills | Status: DC | PRN

## 2021-09-04 ENCOUNTER — Encounter: Payer: Self-pay | Admitting: Physical Therapy

## 2021-09-04 ENCOUNTER — Ambulatory Visit: Payer: Medicare Other | Attending: Family Medicine | Admitting: Physical Therapy

## 2021-09-04 DIAGNOSIS — M25662 Stiffness of left knee, not elsewhere classified: Secondary | ICD-10-CM | POA: Diagnosis not present

## 2021-09-04 DIAGNOSIS — G8929 Other chronic pain: Secondary | ICD-10-CM | POA: Insufficient documentation

## 2021-09-04 DIAGNOSIS — M6281 Muscle weakness (generalized): Secondary | ICD-10-CM | POA: Diagnosis not present

## 2021-09-04 DIAGNOSIS — M25562 Pain in left knee: Secondary | ICD-10-CM | POA: Insufficient documentation

## 2021-09-04 NOTE — Addendum Note (Signed)
Addended by: Starlena Beil, [place] W on: 09/04/2021 04:42 PM ? ? Modules accepted: Orders ? ?

## 2021-09-04 NOTE — Therapy (Signed)
[place] ?[facility] ?[place] ?[place], [place], [address] ?Phone: [phone]   Fax:  [phone] ? ?Physical Therapy Treatment ? ?Patient Details  ?Name: Tiffany Brown ?MRN: [id number] ?Date of Birth: 03-19-1946 ?Referring Provider (PT): Caryl Pina MD ? ? ?Encounter Date: 09/04/2021 ? ? PT End of Session - 09/04/21 1620   ? ? Visit Number 8   ? Number of Visits 12   ? Date for PT Re-Evaluation 10/29/21   ? Authorization Type FOTO AT LEAST EVERY 5TH VISIT.  PROGRESS NOTE AT 10TH VISIT.  KX MODIFIER AFTER 15 VISITS.   ? PT Start Time 0230   ? PT Stop Time 3474   ? PT Time Calculation (min) 52 min   ? Activity Tolerance Patient tolerated treatment well   ? Behavior During Therapy [facility] for tasks assessed/performed   ? ?  ?  ? ?  ? ? ?Past Medical History:  ?Diagnosis Date  ? Abnormal glucose   ? Depression with anxiety   ? Essential hypertension, benign   ? Fibromyalgia   ? Melanoma ([place])   ? Near syncope   ? Other and unspecified hyperlipidemia   ? ? ?Past Surgical History:  ?Procedure Laterality Date  ? ABDOMINAL HYSTERECTOMY    ? BREAST SURGERY    ? LEFT  ? CHOLECYSTECTOMY    ? ELBOW SURGERY    ? X's 3 Left  ? IR ABLATE LIVER CRYOABLATION  02/15/2021  ? IR RADIOLOGIST EVAL & MGMT  02/08/2021  ? IR RADIOLOGIST EVAL & MGMT  03/14/2021  ? RIGHT DISTAL RADIUS FRACTURE    ? TOTAL KNEE ARTHROPLASTY    ? RIGHT  ? ? ?There were no vitals filed for this visit. ? ? Subjective Assessment - 09/04/21 1633   ? ? Subjective Up until 4 am hurting last night.   ? Pertinent History HTN, Fibromyalgia, left elbow surgery, right TKA, h/o left knee pain and bilateral shoulder pain.   ? How long can you walk comfortably? Around home with a Rollator.   ? Patient Stated Goals Get left knee replaced.   ? Currently in Pain? Yes   ? Pain Score 5    ? Pain Location Knee   ? Pain Orientation Left   ? Pain Descriptors / Indicators Discomfort   ? Pain Onset More than a month ago   ? ?  ?  ? ?   ? ? ? ? ? ? ? ? ? ? ? ? ? ? ? ? ? ? ? ? [place] Adult PT Treatment/Exercise - 09/04/21 0001   ? ?  ? Exercises  ? Exercises Knee/Hip   ?  ? Knee/Hip Exercises: Aerobic  ? Nustep Level 3 x 15 minutes.   ?  ? Modalities  ? Modalities Electrical Stimulation;Vasopneumatic   ?  ? Electrical Stimulation  ? Electrical Stimulation Location Left knee.   ? Electrical Stimulation Action IFC at 80-150 Hz.   ? Electrical Stimulation Parameters 40% scan x 20 minutes.   ? Electrical Stimulation Goals Pain;Tone   ?  ? Vasopneumatic  ? Number Minutes Vasopneumatic  20 minutes   ? Vasopnuematic Location  --   Left knee.  ? Vasopneumatic Pressure Low   ?  ? Manual Therapy  ? Manual Therapy Passive ROM   ? Passive ROM In supine:  Gentle PROM to patient's left knee x 8 minutes.   ? ?  ?  ? ?  ? ? ? ? ? ? ? ? ? ? ? ? ? ? ?  PT Long Term Goals - 07/31/21 1314   ? ?  ? PT LONG TERM GOAL #1  ? Title Independent with a HEP.   ? Time 4   ? Period Weeks   ? Status New   ?  ? PT LONG TERM GOAL #2  ? Title Improve left knee extension to -10 degrees.   ? Time 4   ? Period Weeks   ? Status New   ?  ? PT LONG TERM GOAL #3  ? Title Improve left knee flexion to 115 degrees.   ? Time 4   ? Period Weeks   ? Status New   ?  ? PT LONG TERM GOAL #4  ? Title Stand 15 minutes with left knee pain not > 5/10.   ? Time 4   ? Period Weeks   ? Status New   ? ?  ?  ? ?  ? ? ? ? ? ? ? ? Plan - 09/04/21 1637   ? ? Clinical Impression Statement Continued left knee pain and lack of range of motion.  She tolerated PROM performed gently in a low load duration hold fashion without complaint.   ? Personal Factors and Comorbidities Comorbidity 1;Other;Comorbidity 2   ? Comorbidities HTN, Fibromyalgia, left elbow surgery, right TKA, h/o left knee pain and bilateral shoulder pain, left ankle surgery.   ? Examination-Activity Limitations Other;Locomotion Level;Stand   ? Examination-Participation Restrictions Other;Meal Prep   ? Stability/Clinical Decision Making  Evolving/Moderate complexity   ? Rehab Potential Good   ? PT Frequency 2x / week   ? PT Duration 6 weeks   ? PT Treatment/Interventions ADLs/Self Care Home Management;Cryotherapy;Electrical Stimulation;Iontophoresis '4mg'$ /ml Dexamethasone;Moist Heat;Ultrasound;Neuromuscular re-education;Therapeutic exercise;Therapeutic activities;Functional mobility training;Gait training;Patient/family education;Manual techniques;Passive range of motion;Vasopneumatic Device   ? PT Next Visit Plan Left knee passive range of motion, patellar mobilty, pain-free strengthening.  Modalities and STW/M as needed.  Left patellar mobility.   ? Consulted and Agree with Plan of Care Patient   ? ?  ?  ? ?  ? ? ?Patient will benefit from skilled therapeutic intervention in order to improve the following deficits and impairments:  Pain, Abnormal gait, Difficulty walking, Decreased activity tolerance, Decreased mobility, Decreased range of motion, Decreased strength ? ?Visit Diagnosis: ?Chronic pain of left knee ? ?Stiffness of left knee, not elsewhere classified ? ?Muscle weakness (generalized) ? ? ? ? ?Problem List ?Patient Active Problem List  ? Diagnosis Date Noted  ? Stasis dermatitis of both legs 12/24/2018  ? Leg pain, bilateral 12/24/2018  ? Degenerative disc disease, lumbar 12/24/2018  ? Fibromyalgia 12/24/2018  ? Vitamin D deficiency 12/24/2018  ? Class 3 severe obesity with serious comorbidity and body mass index (BMI) of 40.0 to 44.9 in adult [facility]) 07/23/2017  ? Closed fracture of right inferior pubic ramus ([place]) 11/18/2016  ? Gastroesophageal reflux disease 06/06/2016  ? Recurrent major depressive disorder, in full remission ([place]) 06/06/2016  ? DM type 2 with diabetic dyslipidemia ([place]) 03/06/2016  ? Edema 09/14/2013  ? Precordial pain 08/20/2011  ? Essential hypertension, benign   ? Hyperlipidemia   ? ? ?Prisila Dlouhy, [place], PT ?09/04/2021, 4:40 PM ? ?[place] ?[facility] ?[address] ?[place], [place],  [address] ?Phone: [phone]   Fax:  [phone] ? ?Name: Tiffany Brown ?MRN: [id number] ?Date of Birth: 11/30/1945 ? ? ? ?

## 2021-09-05 ENCOUNTER — Other Ambulatory Visit: Payer: Self-pay | Admitting: Neurology

## 2021-09-06 ENCOUNTER — Encounter: Payer: Self-pay | Admitting: Physical Therapy

## 2021-09-06 ENCOUNTER — Ambulatory Visit: Payer: Medicare Other | Admitting: Physical Therapy

## 2021-09-06 DIAGNOSIS — M25562 Pain in left knee: Secondary | ICD-10-CM | POA: Diagnosis not present

## 2021-09-06 DIAGNOSIS — G8929 Other chronic pain: Secondary | ICD-10-CM | POA: Diagnosis not present

## 2021-09-06 DIAGNOSIS — M6281 Muscle weakness (generalized): Secondary | ICD-10-CM | POA: Diagnosis not present

## 2021-09-06 DIAGNOSIS — M25662 Stiffness of left knee, not elsewhere classified: Secondary | ICD-10-CM | POA: Diagnosis not present

## 2021-09-06 NOTE — Therapy (Signed)
[place] [facility] ?[place] ?[place], [place], [address] ?Phone: [phone]   Fax:  [phone] ? ?Physical Therapy Treatment ? ?Patient Details  ?Name: Tiffany Brown ?MRN: [id number] ?Date of Birth: 10/04/1945 ?Referring Provider (PT): Caryl Pina MD ? ? ?Encounter Date: 09/06/2021 ? ? PT End of Session - 09/06/21 1120   ? ? Visit Number 9   ? Number of Visits 12   ? Date for PT Re-Evaluation 10/29/21   ? Authorization Type FOTO AT LEAST EVERY 5TH VISIT.  PROGRESS NOTE AT 10TH VISIT.  KX MODIFIER AFTER 15 VISITS.   ? PT Start Time 1117   ? PT Stop Time 2248   ? PT Time Calculation (min) 41 min   ? Activity Tolerance Patient tolerated treatment well   ? Behavior During Therapy [facility] for tasks assessed/performed   ? ?  ?  ? ?  ? ? ?Past Medical History:  ?Diagnosis Date  ? Abnormal glucose   ? Depression with anxiety   ? Essential hypertension, benign   ? Fibromyalgia   ? Melanoma ([place])   ? Near syncope   ? Other and unspecified hyperlipidemia   ? ? ?Past Surgical History:  ?Procedure Laterality Date  ? ABDOMINAL HYSTERECTOMY    ? BREAST SURGERY    ? LEFT  ? CHOLECYSTECTOMY    ? ELBOW SURGERY    ? X's 3 Left  ? IR ABLATE LIVER CRYOABLATION  02/15/2021  ? IR RADIOLOGIST EVAL & MGMT  02/08/2021  ? IR RADIOLOGIST EVAL & MGMT  03/14/2021  ? RIGHT DISTAL RADIUS FRACTURE    ? TOTAL KNEE ARTHROPLASTY    ? RIGHT  ? ? ?There were no vitals filed for this visit. ? ? Subjective Assessment - 09/06/21 1120   ? ? Subjective Feels pretty good today. More medial knee pain.   ? Pertinent History HTN, Fibromyalgia, left elbow surgery, right TKA, h/o left knee pain and bilateral shoulder pain.   ? How long can you walk comfortably? Around home with a Rollator.   ? Patient Stated Goals Get left knee replaced.   ? Currently in Pain? Yes   ? Pain Score 4    ? Pain Location Knee   ? Pain Orientation Left   ? Pain Descriptors / Indicators Discomfort   ? Pain Type Chronic pain   ? Pain Onset More  than a month ago   ? Pain Frequency Intermittent   ? ?  ?  ? ?  ? ? ? ? ? [facility] PT Assessment - 09/06/21 0001   ? ?  ? Assessment  ? Medical Diagnosis Primary OA of left knee.   ? Referring Provider (PT) Caryl Pina MD   ?  ? Precautions  ? Precautions Fall   ? Precaution Comments Patient requires a Rollator at all times.   ? ?  ?  ? ?  ? ? ? ? ? ? ? ? ? ? ? ? ? ? ? ? [place] Adult PT Treatment/Exercise - 09/06/21 0001   ? ?  ? Knee/Hip Exercises: Aerobic  ? Nustep Level 3 x 15 minutes.   ?  ? Knee/Hip Exercises: Seated  ? Long [employer] Left;20 reps;Weights   ? Long Arc Quad Weight 4 lbs.   ? Clamshell with TheraBand Red   x20 reps  ? Marching Strengthening;Left;20 reps;Weights   ? Marching Limitations 4#   ? Hamstring Curl Strengthening;Left;20 reps;Limitations   ? Hamstring Limitations red theraband   ?  ?  Modalities  ? Modalities Electrical Stimulation;Vasopneumatic   ?  ? Electrical Stimulation  ? Electrical Stimulation Location L knee   ? Electrical Stimulation Action IFC   ? Electrical Stimulation Parameters 80-150 hz x10 min   ? Electrical Stimulation Goals Pain;Tone   ?  ? Vasopneumatic  ? Number Minutes Vasopneumatic  10 minutes   ? Vasopnuematic Location  Knee   ? Vasopneumatic Pressure Low   ? Vasopneumatic Temperature  34   ? ?  ?  ? ?  ? ? ? ? ? ? ? ? ? ? ? ? ? ? ? PT Long Term Goals - 07/31/21 1314   ? ?  ? PT LONG TERM GOAL #1  ? Title Independent with a HEP.   ? Time 4   ? Period Weeks   ? Status New   ?  ? PT LONG TERM GOAL #2  ? Title Improve left knee extension to -10 degrees.   ? Time 4   ? Period Weeks   ? Status New   ?  ? PT LONG TERM GOAL #3  ? Title Improve left knee flexion to 115 degrees.   ? Time 4   ? Period Weeks   ? Status New   ?  ? PT LONG TERM GOAL #4  ? Title Stand 15 minutes with left knee pain not > 5/10.   ? Time 4   ? Period Weeks   ? Status New   ? ?  ?  ? ?  ? ? ? ? ? ? ? ? Plan - 09/06/21 1212   ? ? Clinical Impression Statement Patient presenting in clinic with reports of  pain in L knee especially in medial knee. Patient able to tolerate therex in sitting fairly well. Patient sensitive to touch of medial knee. Normal modalities response noted following removal of the modalities.   ? Personal Factors and Comorbidities Comorbidity 1;Other;Comorbidity 2   ? Comorbidities HTN, Fibromyalgia, left elbow surgery, right TKA, h/o left knee pain and bilateral shoulder pain, left ankle surgery.   ? Examination-Activity Limitations Other;Locomotion Level;Stand   ? Examination-Participation Restrictions Other;Meal Prep   ? Stability/Clinical Decision Making Evolving/Moderate complexity   ? Rehab Potential Good   ? PT Frequency 2x / week   ? PT Duration 6 weeks   ? PT Treatment/Interventions ADLs/Self Care Home Management;Cryotherapy;Electrical Stimulation;Iontophoresis '4mg'$ /ml Dexamethasone;Moist Heat;Ultrasound;Neuromuscular re-education;Therapeutic exercise;Therapeutic activities;Functional mobility training;Gait training;Patient/family education;Manual techniques;Passive range of motion;Vasopneumatic Device   ? PT Next Visit Plan Left knee passive range of motion, patellar mobilty, pain-free strengthening.  Modalities and STW/M as needed.  Left patellar mobility.   ? Consulted and Agree with Plan of Care Patient   ? ?  ?  ? ?  ? ? ?Patient will benefit from skilled therapeutic intervention in order to improve the following deficits and impairments:  Pain, Abnormal gait, Difficulty walking, Decreased activity tolerance, Decreased mobility, Decreased range of motion, Decreased strength ? ?Visit Diagnosis: ?Chronic pain of left knee ? ?Stiffness of left knee, not elsewhere classified ? ?Muscle weakness (generalized) ? ? ? ? ?Problem List ?Patient Active Problem List  ? Diagnosis Date Noted  ? Stasis dermatitis of both legs 12/24/2018  ? Leg pain, bilateral 12/24/2018  ? Degenerative disc disease, lumbar 12/24/2018  ? Fibromyalgia 12/24/2018  ? Vitamin D deficiency 12/24/2018  ? Class 3 severe  obesity with serious comorbidity and body mass index (BMI) of 40.0 to 44.9 in adult [facility]) 07/23/2017  ? Closed fracture of right inferior  pubic ramus ([place]) 11/18/2016  ? Gastroesophageal reflux disease 06/06/2016  ? Recurrent major depressive disorder, in full remission ([place]) 06/06/2016  ? DM type 2 with diabetic dyslipidemia ([place]) 03/06/2016  ? Edema 09/14/2013  ? Precordial pain 08/20/2011  ? Essential hypertension, benign   ? Hyperlipidemia   ? ? ?Standley Brooking, PTA ?09/06/2021, 12:14 PM ? ?[place] [facility] ?[address] ?[place], [place], [address] ?Phone: [phone]   Fax:  [phone] ? ?Name: Kwanza Cancelliere Gorby ?MRN: [id number] ?Date of Birth: [birth year] ? ? ? ?

## 2021-09-07 NOTE — Progress Notes (Signed)
?Subjective:  ?Patient ID: Tiffany Brown, female    DOB: [birth year],  MRN: [id number] ? ?Chief Complaint  ?Patient presents with  ? Routine Post Op  ? ? ?76 y.o. female presents with the above complaint.  Patient presents with complaint of peroneal weakness to the right foot status post peroneal tendon repair on 12/18/2020 by me.  She states that she still having some trouble with the brace.  She is not able to ambulate properly because of it.  She denies any other acute complaints. ? ?Review of Systems: Negative except as noted in the HPI. Denies N/V/F/Ch. ? ?Past Medical History:  ?Diagnosis Date  ? Abnormal glucose   ? Depression with anxiety   ? Essential hypertension, benign   ? Fibromyalgia   ? Melanoma ([place])   ? Near syncope   ? Other and unspecified hyperlipidemia   ? ? ?Current Outpatient Medications:  ?  amLODipine (NORVASC) 5 MG tablet, Take 1 tablet (5 mg total) by mouth daily., Disp: 90 tablet, Rfl: 3 ?  buPROPion (WELLBUTRIN XL) 150 MG 24 hr tablet, Take 1 tablet (150 mg total) by mouth daily., Disp: 90 tablet, Rfl: 3 ?  DULoxetine (CYMBALTA) 60 MG capsule, Take 2 capsules (120 mg total) by mouth daily., Disp: 180 capsule, Rfl: 3 ?  fluticasone (FLONASE) 50 MCG/ACT nasal spray, Place 2 sprays into the nose daily as needed (allergies). , Disp: , Rfl:  ?  furosemide (LASIX) 40 MG tablet, Take 1 tablet (40 mg total) by mouth 2 (two) times daily as needed., Disp: 180 tablet, Rfl: 3 ?  gabapentin (NEURONTIN) 300 MG capsule, TAKE 1 CAPSULE BY MOUTH THREE TIMES A DAY, Disp: 90 capsule, Rfl: 3 ?  ibuprofen (ADVIL) 800 MG tablet, Take 1 tablet (800 mg total) by mouth every 6 (six) hours as needed., Disp: 60 tablet, Rfl: 1 ?  losartan (COZAAR) 100 MG tablet, Take 1 tablet (100 mg total) by mouth daily., Disp: 90 tablet, Rfl: 3 ?  meloxicam (MOBIC) 7.5 MG tablet, TAKE 1 TABLET BY MOUTH EVERY DAY, Disp: 90 tablet, Rfl: 3 ?  methocarbamol (ROBAXIN) 750 MG tablet, Take 750 mg by mouth 2 (two) times daily., Disp: ,  Rfl:  ?  omeprazole (PRILOSEC) 40 MG capsule, Take 40 mg by mouth every morning., Disp: , Rfl:  ?  oxyCODONE-acetaminophen (PERCOCET) 5-325 MG tablet, Take 1 tablet by mouth every 4 (four) hours as needed for severe pain., Disp: 30 tablet, Rfl: 0 ?  pravastatin (PRAVACHOL) 80 MG tablet, Take 1 tablet (80 mg total) by mouth daily., Disp: 90 tablet, Rfl: 3 ?  traMADol (ULTRAM-ER) 100 MG 24 hr tablet, Take 100 mg by mouth daily., Disp: , Rfl:  ? ?Social History  ? ?Tobacco Use  ?Smoking Status Former  ? Packs/day: 0.80  ? Years: 25.00  ? Pack years: 20.00  ? Types: Cigarettes  ? Quit date: 05/06/1994  ? Years since quitting: 27.3  ?Smokeless Tobacco Never  ? ? ?Allergies  ?Allergen Reactions  ? Other   ? ?Objective:  ?There were no vitals filed for this visit. ?There is no height or weight on file to calculate BMI. ?Constitutional Well developed. ?Well nourished.  ?Vascular Dorsalis pedis pulses palpable bilaterally. ?Posterior tibial pulses palpable bilaterally. ?Capillary refill normal to all digits.  ?No cyanosis or clubbing noted. ?Pedal hair growth normal.  ?Neurologic Normal speech. ?Oriented to person, place, and time. ?Epicritic sensation to light touch grossly present bilaterally.  ?Dermatologic Nails well groomed and normal in appearance. ?No  open wounds. ?No skin lesions.  ?Orthopedic: Residual weakness of the peroneal tendon noted to the right lower extremity.  Weakness noted to the active dorsiflexion eversion of the foot.  4 out of 5.  Gait examination shows slightly pes cavovarus foot structure likely due to peroneal weakness and overpowering of the posterior tibial tendon.  ? ?Radiographs: None ?Assessment:  ? ?1. Peroneal tendinitis, right   ? ? ?Plan:  ?Patient was evaluated and treated and all questions answered. ? ?Right cavovarus foot type status post weakness of the peroneal tendon with history of peroneal tendon repair ?-All questions and concerns were discussed with the patient in extensive  detail. ?-Given that patient did not want to do physical therapy while undergoing in the postop period I believe she will benefit from some physical therapy at this time to increase the strength of the peroneal tendons.  She does have some strength therefore it is intact and not concerned for an acute tear.  She also does not have any traumatic event that she can recall. ?-Continue doing physical therapy ?-Continue wearing a Tri-Lock ankle brace ?-Patient has been ambulating with a [place] brace however she still not getting the stabilization. ?-At this time I believe she will benefit from a repeat MRI to assess the right peroneal tendon to rule out any accessory tears.  She states understand like to proceed with ? ?No follow-ups on file.  ?

## 2021-09-11 ENCOUNTER — Ambulatory Visit: Payer: Medicare Other | Admitting: Physical Therapy

## 2021-09-11 DIAGNOSIS — M25662 Stiffness of left knee, not elsewhere classified: Secondary | ICD-10-CM | POA: Diagnosis not present

## 2021-09-11 DIAGNOSIS — M6281 Muscle weakness (generalized): Secondary | ICD-10-CM

## 2021-09-11 DIAGNOSIS — M25562 Pain in left knee: Secondary | ICD-10-CM | POA: Diagnosis not present

## 2021-09-11 DIAGNOSIS — G8929 Other chronic pain: Secondary | ICD-10-CM | POA: Diagnosis not present

## 2021-09-11 NOTE — Therapy (Signed)
?[facility] ?[place] ?[place], [place], [address] ?Phone: [phone]   Fax:  [phone] ? ?Physical Therapy Treatment ? ?Patient Details  ?Name: Tiffany Brown ?MRN: [id number] ?Date of Birth: [birth year] ?Referring Provider (PT): Caryl Pina MD ? ? ?Encounter Date: 09/11/2021 ? ? PT End of Session - 09/11/21 1206   ? ? Visit Number 10   ? Number of Visits 12   ? Date for PT Re-Evaluation 10/29/21   ? Authorization Type FOTO AT LEAST EVERY 5TH VISIT.  PROGRESS NOTE AT 10TH VISIT.  KX MODIFIER AFTER 15 VISITS.   ? PT Start Time 1115   ? PT Stop Time 1202   ? PT Time Calculation (min) 47 min   ? Activity Tolerance Patient tolerated treatment well   ? Behavior During Therapy [facility] for tasks assessed/performed   ? ?  ?  ? ?  ? ? ?Past Medical History:  ?Diagnosis Date  ? Abnormal glucose   ? Depression with anxiety   ? Essential hypertension, benign   ? Fibromyalgia   ? Melanoma ([place])   ? Near syncope   ? Other and unspecified hyperlipidemia   ? ? ?Past Surgical History:  ?Procedure Laterality Date  ? ABDOMINAL HYSTERECTOMY    ? BREAST SURGERY    ? LEFT  ? CHOLECYSTECTOMY    ? ELBOW SURGERY    ? X's 3 Left  ? IR ABLATE LIVER CRYOABLATION  02/15/2021  ? IR RADIOLOGIST EVAL & MGMT  02/08/2021  ? IR RADIOLOGIST EVAL & MGMT  03/14/2021  ? RIGHT DISTAL RADIUS FRACTURE    ? TOTAL KNEE ARTHROPLASTY    ? RIGHT  ? ? ?There were no vitals filed for this visit. ? ? Subjective Assessment - 09/11/21 1200   ? ? Subjective Pain at a 6 today with patient reporting mostly medially.   ? Pertinent History HTN, Fibromyalgia, left elbow surgery, right TKA, h/o left knee pain and bilateral shoulder pain.   ? How long can you walk comfortably? Around home with a Rollator.   ? Patient Stated Goals Get left knee replaced.   ? Currently in Pain? Yes   ? Pain Score 6    ? Pain Location Knee   ? Pain Orientation Left   ? Pain Descriptors / Indicators Discomfort   ? Pain Type Chronic pain   ? Pain  Onset More than a month ago   ? ?  ?  ? ?  ? ? ? ? ? ? ? ? ? ? ? ? ? ? ? ? ? ? ? ? [facility] Adult PT Treatment/Exercise - 09/11/21 0001   ? ?  ? Exercises  ? Exercises Knee/Hip   ?  ? Knee/Hip Exercises: Aerobic  ? Nustep Level 4 x 15 minutes.   ?  ? Modalities  ? Modalities Electrical Stimulation;Moist Heat   ?  ? Moist Heat Therapy  ? Number Minutes Moist Heat 20 Minutes   ? Moist Heat Location --   Left knee.  ?  ? Electrical Stimulation  ? Electrical Stimulation Location Left medial knee.   ? Electrical Stimulation Action IFC at 80-150 Hz.   ? Electrical Stimulation Parameters 40% scan x 20 minutes.   ? Electrical Stimulation Goals Tone;Pain   ?  ? Manual Therapy  ? Manual Therapy Soft tissue mobilization   ? Passive ROM STW/M x 8 minutes to patient's left medial knee to decrease pain.   ? ?  ?  ? ?  ? ? ? ? ? ? ? ? ? ? ? ? ? ? ?  PT Long Term Goals - 09/11/21 1206   ? ?  ? PT LONG TERM GOAL #1  ? Title Independent with a HEP.   ? Time 4   ? Period Weeks   ? Status On-going   ?  ? PT LONG TERM GOAL #2  ? Title Improve left knee extension to -10 degrees.   ? Time 4   ? Period Weeks   ? Status On-going   ?  ? PT LONG TERM GOAL #3  ? Title Improve left knee flexion to 115 degrees.   ? Time 4   ? Period Weeks   ? Status On-going   ?  ? PT LONG TERM GOAL #4  ? Title Stand 15 minutes with left knee pain not > 5/10.   ? Time 4   ? Period Weeks   ? Status On-going   ? ?  ?  ? ?  ? ? ? ? ? ? ? ? Plan - 09/11/21 1203   ? ? Clinical Impression Statement Patient with increased left medial knee pain.  She was palpable tender in her left medial patellar and MCL region.  She tolerated soft tissue well and found it helpful in decrease her pain some.   ? Personal Factors and Comorbidities Comorbidity 1;Other;Comorbidity 2   ? Comorbidities HTN, Fibromyalgia, left elbow surgery, right TKA, h/o left knee pain and bilateral shoulder pain, left ankle surgery.   ? Examination-Activity Limitations Other;Locomotion Level;Stand   ?  Examination-Participation Restrictions Other;Meal Prep   ? Stability/Clinical Decision Making Evolving/Moderate complexity   ? Rehab Potential Good   ? PT Frequency 2x / week   ? PT Duration 6 weeks   ? PT Treatment/Interventions ADLs/Self Care Home Management;Cryotherapy;Electrical Stimulation;Iontophoresis '4mg'$ /ml Dexamethasone;Moist Heat;Ultrasound;Neuromuscular re-education;Therapeutic exercise;Therapeutic activities;Functional mobility training;Gait training;Patient/family education;Manual techniques;Passive range of motion;Vasopneumatic Device   ? PT Next Visit Plan Left knee passive range of motion, patellar mobilty, pain-free strengthening.  Modalities and STW/M as needed.  Left patellar mobility.   ? Consulted and Agree with Plan of Care Patient   ? ?  ?  ? ?  ? ? ?Patient will benefit from skilled therapeutic intervention in order to improve the following deficits and impairments:  Pain, Abnormal gait, Difficulty walking, Decreased activity tolerance, Decreased mobility, Decreased range of motion, Decreased strength ? ?Visit Diagnosis: ?Chronic pain of left knee ? ?Stiffness of left knee, not elsewhere classified ? ?Muscle weakness (generalized) ? ? ? ? ?Problem List ?Patient Active Problem List  ? Diagnosis Date Noted  ? Stasis dermatitis of both legs 12/24/2018  ? Leg pain, bilateral 12/24/2018  ? Degenerative disc disease, lumbar 12/24/2018  ? Fibromyalgia 12/24/2018  ? Vitamin D deficiency 12/24/2018  ? Class 3 severe obesity with serious comorbidity and body mass index (BMI) of 40.0 to 44.9 in adult [facility]) 07/23/2017  ? Closed fracture of right inferior pubic ramus ([place]) 11/18/2016  ? Gastroesophageal reflux disease 06/06/2016  ? Recurrent major depressive disorder, in full remission ([place]) 06/06/2016  ? DM type 2 with diabetic dyslipidemia ([place]) 03/06/2016  ? Edema 09/14/2013  ? Precordial pain 08/20/2011  ? Essential hypertension, benign   ? Hyperlipidemia   ? ?Progress Note ?Reporting Period 07/31/21  to 09/11/21 ? ?See note below for Objective Data and Assessment of Progress/Goals. Continued left knee pain.  Goals ongoing.  2 visits remaining. ? ? ? ?Siera Beyersdorf, [place], PT ?09/11/2021, 12:14 PM ? ?[place] ?[facility] ?[place] ?[place], [place], [address] ?Phone: [phone]   Fax:  [phone] ? ?Name: Tiffany Brown ?MRN: [id number] ?Date of Birth: [birth year] ? ? ? ?

## 2021-09-12 ENCOUNTER — Other Ambulatory Visit: Payer: Self-pay | Admitting: Neurology

## 2021-09-13 ENCOUNTER — Ambulatory Visit: Payer: Medicare Other | Admitting: Physical Therapy

## 2021-09-13 ENCOUNTER — Encounter: Payer: Self-pay | Admitting: Physical Therapy

## 2021-09-13 DIAGNOSIS — M25662 Stiffness of left knee, not elsewhere classified: Secondary | ICD-10-CM

## 2021-09-13 DIAGNOSIS — M6281 Muscle weakness (generalized): Secondary | ICD-10-CM | POA: Diagnosis not present

## 2021-09-13 DIAGNOSIS — G8929 Other chronic pain: Secondary | ICD-10-CM

## 2021-09-13 DIAGNOSIS — M25562 Pain in left knee: Secondary | ICD-10-CM | POA: Diagnosis not present

## 2021-09-13 NOTE — Therapy (Signed)
[place] [facility] ?[place] ?[place], [place], [address] ?Phone: [phone]   Fax:  [phone] ? ?Physical Therapy Treatment ? ?Patient Details  ?Name: Tiffany Brown ?MRN: [id number] ?Date of Birth: [birth year] ?Referring Provider (PT): Caryl Pina MD ? ? ?Encounter Date: 09/13/2021 ? ? PT End of Session - 09/13/21 1150   ? ? Visit Number 11   ? Number of Visits 12   ? Date for PT Re-Evaluation 10/29/21   ? Authorization Type FOTO AT LEAST EVERY 5TH VISIT.  PROGRESS NOTE AT 10TH VISIT.  KX MODIFIER AFTER 15 VISITS.   ? PT Start Time 1116   ? PT Stop Time 1200   ? PT Time Calculation (min) 44 min   ? Activity Tolerance Patient tolerated treatment well   ? Behavior During Therapy [facility] for tasks assessed/performed   ? ?  ?  ? ?  ? ? ?Past Medical History:  ?Diagnosis Date  ? Abnormal glucose   ? Depression with anxiety   ? Essential hypertension, benign   ? Fibromyalgia   ? Melanoma ([place])   ? Near syncope   ? Other and unspecified hyperlipidemia   ? ? ?Past Surgical History:  ?Procedure Laterality Date  ? ABDOMINAL HYSTERECTOMY    ? BREAST SURGERY    ? LEFT  ? CHOLECYSTECTOMY    ? ELBOW SURGERY    ? X's 3 Left  ? IR ABLATE LIVER CRYOABLATION  02/15/2021  ? IR RADIOLOGIST EVAL & MGMT  02/08/2021  ? IR RADIOLOGIST EVAL & MGMT  03/14/2021  ? RIGHT DISTAL RADIUS FRACTURE    ? TOTAL KNEE ARTHROPLASTY    ? RIGHT  ? ? ?There were no vitals filed for this visit. ? ? Subjective Assessment - 09/13/21 1149   ? ? Subjective Reports pain at 6/10 and has been hurting a lot recently.   ? Pertinent History HTN, Fibromyalgia, left elbow surgery, right TKA, h/o left knee pain and bilateral shoulder pain.   ? How long can you walk comfortably? Around home with a Rollator.   ? Patient Stated Goals Get left knee replaced.   ? Currently in Pain? Yes   ? Pain Score 6    ? Pain Location Knee   ? Pain Orientation Left   ? Pain Descriptors / Indicators Discomfort   ? Pain Type Chronic pain   ? Pain  Onset More than a month ago   ? Pain Frequency Intermittent   ? ?  ?  ? ?  ? ? ? ? ? [facility] PT Assessment - 09/13/21 0001   ? ?  ? Assessment  ? Medical Diagnosis Primary OA of left knee.   ? Referring Provider (PT) Caryl Pina MD   ?  ? Precautions  ? Precautions Fall   ? Precaution Comments Patient requires a Rollator at all times.   ? ?  ?  ? ?  ? ? ? ? ? ? ? ? ? ? ? ? ? ? ? ? [facility] Adult PT Treatment/Exercise - 09/13/21 0001   ? ?  ? Knee/Hip Exercises: Aerobic  ? Nustep Level 4 x 15 minutes.   ?  ? Knee/Hip Exercises: Seated  ? Long [employer] Left;20 reps;Weights   ? Long Arc Quad Weight 4 lbs.   ? [employer] x20 reps   ? Clamshell with Marga Hoots   x20 reps  ? Hamstring Curl Strengthening;Left;20 reps;Limitations   ? Hamstring Limitations green theraband   ?  ? Modalities  ?  Modalities Electrical Stimulation;Vasopneumatic   ?  ? Electrical Stimulation  ? Electrical Stimulation Location L knee   ? Electrical Stimulation Action IFC   ? Electrical Stimulation Parameters 80-150 hz x15 min   ? Electrical Stimulation Goals Pain   ?  ? Vasopneumatic  ? Number Minutes Vasopneumatic  15 minutes   ? Vasopnuematic Location  Knee   ? Vasopneumatic Pressure Low   ? Vasopneumatic Temperature  34   ? ?  ?  ? ?  ? ? ? ? ? ? ? ? ? ? ? ? ? ? ? PT Long Term Goals - 09/11/21 1206   ? ?  ? PT LONG TERM GOAL #1  ? Title Independent with a HEP.   ? Time 4   ? Period Weeks   ? Status On-going   ?  ? PT LONG TERM GOAL #2  ? Title Improve left knee extension to -10 degrees.   ? Time 4   ? Period Weeks   ? Status On-going   ?  ? PT LONG TERM GOAL #3  ? Title Improve left knee flexion to 115 degrees.   ? Time 4   ? Period Weeks   ? Status On-going   ?  ? PT LONG TERM GOAL #4  ? Title Stand 15 minutes with left knee pain not > 5/10.   ? Time 4   ? Period Weeks   ? Status On-going   ? ?  ?  ? ?  ? ? ? ? ? ? ? ? Plan - 09/13/21 1215   ? ? Clinical Impression Statement Patient presented in clinic with greater L knee pain recently.  Patient limited with therex due to R knee pain. Normal modalities response noted following removal of the modalities.   ? Personal Factors and Comorbidities Comorbidity 1;Other;Comorbidity 2   ? Comorbidities HTN, Fibromyalgia, left elbow surgery, right TKA, h/o left knee pain and bilateral shoulder pain, left ankle surgery.   ? Examination-Activity Limitations Other;Locomotion Level;Stand   ? Examination-Participation Restrictions Other;Meal Prep   ? Stability/Clinical Decision Making Evolving/Moderate complexity   ? Rehab Potential Good   ? PT Frequency 2x / week   ? PT Duration 6 weeks   ? PT Treatment/Interventions ADLs/Self Care Home Management;Cryotherapy;Electrical Stimulation;Iontophoresis '4mg'$ /ml Dexamethasone;Moist Heat;Ultrasound;Neuromuscular re-education;Therapeutic exercise;Therapeutic activities;Functional mobility training;Gait training;Patient/family education;Manual techniques;Passive range of motion;Vasopneumatic Device   ? PT Next Visit Plan Left knee passive range of motion, patellar mobilty, pain-free strengthening.  Modalities and STW/M as needed.  Left patellar mobility.   ? Consulted and Agree with Plan of Care Patient   ? ?  ?  ? ?  ? ? ?Patient will benefit from skilled therapeutic intervention in order to improve the following deficits and impairments:  Pain, Abnormal gait, Difficulty walking, Decreased activity tolerance, Decreased mobility, Decreased range of motion, Decreased strength ? ?Visit Diagnosis: ?Chronic pain of left knee ? ?Stiffness of left knee, not elsewhere classified ? ?Muscle weakness (generalized) ? ? ? ? ?Problem List ?Patient Active Problem List  ? Diagnosis Date Noted  ? Stasis dermatitis of both legs 12/24/2018  ? Leg pain, bilateral 12/24/2018  ? Degenerative disc disease, lumbar 12/24/2018  ? Fibromyalgia 12/24/2018  ? Vitamin D deficiency 12/24/2018  ? Class 3 severe obesity with serious comorbidity and body mass index (BMI) of 40.0 to 44.9 in adult [facility])  07/23/2017  ? Closed fracture of right inferior pubic ramus ([place]) 11/18/2016  ? Gastroesophageal reflux disease 06/06/2016  ? Recurrent major depressive  disorder, in full remission ([place]) 06/06/2016  ? DM type 2 with diabetic dyslipidemia ([place]) 03/06/2016  ? Edema 09/14/2013  ? Precordial pain 08/20/2011  ? Essential hypertension, benign   ? Hyperlipidemia   ? ? ?Standley Brooking, PTA ?09/13/2021, 12:20 PM ? ?[place] [facility] ?[place] ?[place], [place], [address] ?Phone: [phone]   Fax:  [phone] ? ?Name: Tiffany Brown ?MRN: [id number] ?Date of Birth: 1945/05/20 ? ? ? ?

## 2021-09-17 ENCOUNTER — Telehealth: Payer: Self-pay | Admitting: Podiatry

## 2021-09-17 ENCOUNTER — Other Ambulatory Visit: Payer: Self-pay | Admitting: Podiatry

## 2021-09-17 MED ORDER — GABAPENTIN 300 MG PO CAPS: ORAL_CAPSULE | ORAL | 3 refills | Status: DC

## 2021-09-17 NOTE — Telephone Encounter (Signed)
Notified pt and she said thank you. ? ?

## 2021-09-17 NOTE — Telephone Encounter (Signed)
Refill sent. Please let her know. Thanks ?

## 2021-09-17 NOTE — Telephone Encounter (Signed)
Pt called and is out of gabepentin and asking if she could get a refill. She is a Dr Posey Pronto ? ?[facility] [place] ?

## 2021-09-19 DIAGNOSIS — D492 Neoplasm of unspecified behavior of bone, soft tissue, and skin: Secondary | ICD-10-CM | POA: Diagnosis not present

## 2021-09-19 DIAGNOSIS — C4402 Squamous cell carcinoma of skin of lip: Secondary | ICD-10-CM | POA: Diagnosis not present

## 2021-09-19 DIAGNOSIS — C44329 Squamous cell carcinoma of skin of other parts of face: Secondary | ICD-10-CM | POA: Diagnosis not present

## 2021-09-19 DIAGNOSIS — D0362 Melanoma in situ of left upper limb, including shoulder: Secondary | ICD-10-CM | POA: Diagnosis not present

## 2021-09-19 DIAGNOSIS — C4362 Malignant melanoma of left upper limb, including shoulder: Secondary | ICD-10-CM | POA: Diagnosis not present

## 2021-09-19 DIAGNOSIS — D485 Neoplasm of uncertain behavior of skin: Secondary | ICD-10-CM | POA: Diagnosis not present

## 2021-09-19 DIAGNOSIS — C44311 Basal cell carcinoma of skin of nose: Secondary | ICD-10-CM | POA: Diagnosis not present

## 2021-09-20 ENCOUNTER — Ambulatory Visit: Payer: Medicare Other | Admitting: Physical Therapy

## 2021-09-20 ENCOUNTER — Encounter: Payer: Self-pay | Admitting: Physical Therapy

## 2021-09-20 DIAGNOSIS — G8929 Other chronic pain: Secondary | ICD-10-CM | POA: Diagnosis not present

## 2021-09-20 DIAGNOSIS — M25562 Pain in left knee: Secondary | ICD-10-CM | POA: Diagnosis not present

## 2021-09-20 DIAGNOSIS — M25662 Stiffness of left knee, not elsewhere classified: Secondary | ICD-10-CM

## 2021-09-20 DIAGNOSIS — M6281 Muscle weakness (generalized): Secondary | ICD-10-CM

## 2021-09-20 NOTE — Therapy (Addendum)
[facility] [place], [place], [address] Phone: [phone]   Fax:  [phone]  Physical Therapy Treatment  Patient Details  Name: Tiffany Brown MRN: [id number] Date of Birth: 12/20/1945 Referring Provider (PT): Caryl Pina MD   Encounter Date: 09/20/2021   PT End of Session - 09/20/21 1137     Visit Number 12    Number of Visits 12    Date for PT Re-Evaluation 10/29/21    Authorization Type FOTO AT LEAST EVERY 5TH VISIT.  PROGRESS NOTE AT 10TH VISIT.  KX MODIFIER AFTER 15 VISITS.    PT Start Time 1120    PT Stop Time 1201    PT Time Calculation (min) 41 min    Activity Tolerance Patient tolerated treatment well    Behavior During Therapy WFL for tasks assessed/performed             Past Medical History:  Diagnosis Date   Abnormal glucose    Depression with anxiety    Essential hypertension, benign    Fibromyalgia    Melanoma ([place])    Near syncope    Other and unspecified hyperlipidemia     Past Surgical History:  Procedure Laterality Date   ABDOMINAL HYSTERECTOMY     BREAST SURGERY     LEFT   CHOLECYSTECTOMY     ELBOW SURGERY     X's 3 Left   IR ABLATE LIVER CRYOABLATION  02/15/2021   IR RADIOLOGIST EVAL & MGMT  02/08/2021   IR RADIOLOGIST EVAL & MGMT  03/14/2021   RIGHT DISTAL RADIUS FRACTURE     TOTAL KNEE ARTHROPLASTY     RIGHT    There were no vitals filed for this visit.   Subjective Assessment - 09/20/21 1137     Subjective Reports her knee was doing good prior to PT.    Pertinent History HTN, Fibromyalgia, left elbow surgery, right TKA, h/o left knee pain and bilateral shoulder pain.    How long can you walk comfortably? Around home with a Rollator.    Patient Stated Goals Get left knee replaced.    Currently in Pain? Yes    Pain Score 5     Pain Location Knee    Pain Orientation Left    Pain Descriptors / Indicators Discomfort    Pain Type Chronic pain    Pain Onset More  than a month ago    Pain Frequency Intermittent                [facility] PT Assessment - 09/20/21 0001       Assessment   Medical Diagnosis Primary OA of left knee.    Referring Provider (PT) Caryl Pina MD      Precautions   Precautions Fall    Precaution Comments Patient requires a Rollator at all times.      ROM / Strength   AROM / PROM / Strength AROM      AROM   Overall AROM  Within functional limits for tasks performed    AROM Assessment Site Knee    Right/Left Knee Left    Left Knee Extension 13    Left Knee Flexion 103                           [facility] Adult PT Treatment/Exercise - 09/20/21 0001       Knee/Hip Exercises: Aerobic   Nustep Level 4 x 15 minutes.  Knee/Hip Exercises: Seated   Long Arc Quad Left;20 reps;Weights    Long Arc Quad Weight 4 lbs.    Clamshell with Marga Hoots   x20 reps   Marching Strengthening;Left;2 sets;10 reps;Weights    Marching Weights 4 lbs.    Hamstring Curl Strengthening;Left;20 reps;Limitations    Hamstring Limitations green theraband      Modalities   Modalities Psychologist, educational Location L knee    Electrical Stimulation Action IFC    Electrical Stimulation Parameters 80-150 hz x15 min    Electrical Stimulation Goals Pain      Vasopneumatic   Number Minutes Vasopneumatic  10 minutes    Vasopnuematic Location  Knee    Vasopneumatic Pressure Low    Vasopneumatic Temperature  34                          PT Long Term Goals - 09/20/21 1156       PT LONG TERM GOAL #1   Title Independent with a HEP.    Time 4    Period Weeks    Status Unable to assess      PT LONG TERM GOAL #2   Title Improve left knee extension to -10 degrees.    Time 4    Period Weeks    Status Not Met      PT LONG TERM GOAL #3   Title Improve left knee flexion to 115 degrees.    Time 4    Period Weeks    Status Not Met       PT LONG TERM GOAL #4   Title Stand 15 minutes with left knee pain not > 5/10.    Time 4    Period Weeks    Status Not Met                   Plan - 09/20/21 1203     Clinical Impression Statement Patient presented in clinic with somewhat less L knee pain. Patient limited on goals due to pain and ROM limitations. AROM of L knee measured as 13-103 deg. Patient limited with transfers and requires near constant use of rollator for gait and transfers. Normal modalities response noted following removal of the modalities.    Personal Factors and Comorbidities Comorbidity 1;Other;Comorbidity 2    Comorbidities HTN, Fibromyalgia, left elbow surgery, right TKA, h/o left knee pain and bilateral shoulder pain, left ankle surgery.    Examination-Activity Limitations Other;Locomotion Level;Stand    Examination-Participation Restrictions Other;Meal Prep    Stability/Clinical Decision Making Evolving/Moderate complexity    Rehab Potential Good    PT Frequency 2x / week    PT Duration 6 weeks    PT Treatment/Interventions ADLs/Self Care Home Management;Cryotherapy;Electrical Stimulation;Iontophoresis 62m/ml Dexamethasone;Moist Heat;Ultrasound;Neuromuscular re-education;Therapeutic exercise;Therapeutic activities;Functional mobility training;Gait training;Patient/family education;Manual techniques;Passive range of motion;Vasopneumatic Device    PT Next Visit Plan Left knee passive range of motion, patellar mobilty, pain-free strengthening.  Modalities and STW/M as needed.  Left patellar mobility.    Consulted and Agree with Plan of Care Patient             Patient will benefit from skilled therapeutic intervention in order to improve the following deficits and impairments:  Pain, Abnormal gait, Difficulty walking, Decreased activity tolerance, Decreased mobility, Decreased range of motion, Decreased strength  Visit Diagnosis: Chronic pain of left knee  Stiffness of left knee, not  elsewhere classified  Muscle weakness (generalized)     Problem List Patient Active Problem List   Diagnosis Date Noted   Stasis dermatitis of both legs 12/24/2018   Leg pain, bilateral 12/24/2018   Degenerative disc disease, lumbar 12/24/2018   Fibromyalgia 12/24/2018   Vitamin D deficiency 12/24/2018   Class 3 severe obesity with serious comorbidity and body mass index (BMI) of 40.0 to 44.9 in adult ([place]) 07/23/2017   Closed fracture of right inferior pubic ramus ([place]) 11/18/2016   Gastroesophageal reflux disease 06/06/2016   Recurrent major depressive disorder, in full remission ([place]) 06/06/2016   DM type 2 with diabetic dyslipidemia ([place]) 03/06/2016   Edema 09/14/2013   Precordial pain 08/20/2011   Essential hypertension, benign    Hyperlipidemia     Standley Brooking, PTA 09/20/2021, 2:56 PM  [facility] [address] [place], [place], [address] Phone: [phone]   Fax:  [phone]  Name: Tiffany Brown MRN: [id number] Date of Birth: [birth year]  PHYSICAL THERAPY DISCHARGE SUMMARY  Visits from Start of Care: 12  Current functional level related to goals / functional outcomes: Patient is being discharged at this time as she has not made significant progress with skilled physical therapy.    Remaining deficits: Pain, ROM    Education / Equipment: HEP    Patient agrees to discharge. Patient goals were not met. Patient is being discharged due to lack of progress.  Jacqulynn Cadet, PT, DPT

## 2021-09-21 ENCOUNTER — Ambulatory Visit: Payer: Medicare Other

## 2021-09-21 DIAGNOSIS — D485 Neoplasm of uncertain behavior of skin: Secondary | ICD-10-CM | POA: Diagnosis not present

## 2021-09-21 DIAGNOSIS — C4491 Basal cell carcinoma of skin, unspecified: Secondary | ICD-10-CM | POA: Diagnosis not present

## 2021-09-23 ENCOUNTER — Ambulatory Visit
Admission: RE | Admit: 2021-09-23 | Discharge: 2021-09-23 | Disposition: A | Discharge status: Home / Self Care | Payer: Medicare Other | Source: Ambulatory Visit | Attending: Podiatry | Admitting: Podiatry

## 2021-09-23 DIAGNOSIS — Z8739 Personal history of other diseases of the musculoskeletal system and connective tissue: Secondary | ICD-10-CM | POA: Diagnosis not present

## 2021-09-23 DIAGNOSIS — S86311A Strain of muscle(s) and tendon(s) of peroneal muscle group at lower leg level, right leg, initial encounter: Secondary | ICD-10-CM | POA: Diagnosis not present

## 2021-09-23 DIAGNOSIS — M7671 Peroneal tendinitis, right leg: Secondary | ICD-10-CM

## 2021-09-23 DIAGNOSIS — Z9889 Other specified postprocedural states: Secondary | ICD-10-CM | POA: Diagnosis not present

## 2021-09-24 ENCOUNTER — Ambulatory Visit (INDEPENDENT_AMBULATORY_CARE_PROVIDER_SITE_OTHER): Payer: Medicare Other

## 2021-09-24 VITALS — Wt 268.0 lb

## 2021-09-24 DIAGNOSIS — Z0000 Encounter for general adult medical examination without abnormal findings: Secondary | ICD-10-CM | POA: Diagnosis not present

## 2021-09-24 NOTE — Patient Instructions (Signed)
Tiffany Brown , Thank you for taking time to come for your Medicare Wellness Visit. I appreciate your ongoing commitment to your health goals. Please review the following plan we discussed and let me know if I can assist you in the future.   Screening recommendations/referrals: Colonoscopy: Done 05/07/2012 and Cologuard with GI doctor in 2021 negative - no repeat? Discuss with GI Mammogram: Done 08/13/2021 - Repeat annually Bone Density: Done 05/02/2020 - Repeat every 2 years  Recommended yearly ophthalmology/optometry visit for glaucoma screening and checkup Recommended yearly dental visit for hygiene and checkup  Vaccinations: Influenza vaccine: Done 01/25/2021 - Repeat annually Pneumococcal vaccine: Done  07/03/2015 & 08/05/2016 Tdap vaccine: Done 05/03/2019 - Repeat in 10 years Shingles vaccine: Done  10/25/2020 & 07/25/2021  Covid-19: Done 06/30/2019, 07/28/2019, 03/07/2020  Advanced directives: Advance directive discussed with you today. Even though you declined this today, please call our office should you change your mind, and we can give you the proper paperwork for you to fill out.   Conditions/risks identified: Keep up the great work! Aim for 30 minutes of exercise - stretching and/or walking, 6-8 glasses of water, and 5 servings of fruits and vegetables each day.   Next appointment: Follow up in one year for your annual wellness visit    Preventive Care 65 Years and Older, Female Preventive care refers to lifestyle choices and visits with your health care provider that can promote health and wellness. What does preventive care include? A yearly physical exam. This is also called an annual well check. Dental exams once or twice a year. Routine eye exams. Ask your health care provider how often you should have your eyes checked. Personal lifestyle choices, including: Daily care of your teeth and gums. Regular physical activity. Eating a healthy diet. Avoiding tobacco and drug  use. Limiting alcohol use. Practicing safe sex. Taking low-dose aspirin every day. Taking vitamin and mineral supplements as recommended by your health care provider. What happens during an annual well check? The services and screenings done by your health care provider during your annual well check will depend on your age, overall health, lifestyle risk factors, and family history of disease. Counseling  Your health care provider may ask you questions about your: Alcohol use. Tobacco use. Drug use. Emotional well-being. Home and relationship well-being. Sexual activity. Eating habits. History of falls. Memory and ability to understand (cognition). Work and work Statistician. Reproductive health. Screening  You may have the following tests or measurements: Height, weight, and BMI. Blood pressure. Lipid and cholesterol levels. These may be checked every 5 years, or more frequently if you are over 12 years old. Skin check. Lung cancer screening. You may have this screening every year starting at age 63 if you have a 30-pack-year history of smoking and currently smoke or have quit within the past 15 years. Fecal occult blood test (FOBT) of the stool. You may have this test every year starting at age 48. Flexible sigmoidoscopy or colonoscopy. You may have a sigmoidoscopy every 5 years or a colonoscopy every 10 years starting at age 7. Hepatitis C blood test. Hepatitis B blood test. Sexually transmitted disease (STD) testing. Diabetes screening. This is done by checking your blood sugar (glucose) after you have not eaten for a while (fasting). You may have this done every 1-3 years. Bone density scan. This is done to screen for osteoporosis. You may have this done starting at age 47. Mammogram. This may be done every 1-2 years. Talk to your health care  provider about how often you should have regular mammograms. Talk with your health care provider about your test results, treatment  options, and if necessary, the need for more tests. Vaccines  Your health care provider may recommend certain vaccines, such as: Influenza vaccine. This is recommended every year. Tetanus, diphtheria, and acellular pertussis (Tdap, Td) vaccine. You may need a Td booster every 10 years. Zoster vaccine. You may need this after age 29. Pneumococcal 13-valent conjugate (PCV13) vaccine. One dose is recommended after age 7. Pneumococcal polysaccharide (PPSV23) vaccine. One dose is recommended after age 43. Talk to your health care provider about which screenings and vaccines you need and how often you need them. This information is not intended to replace advice given to you by your health care provider. Make sure you discuss any questions you have with your health care provider. Document Released: 05/19/2015 Document Revised: 01/10/2016 Document Reviewed: 02/21/2015 Elsevier Interactive Patient Education  2017 [employer] Prevention in the Home Falls can cause injuries. They can happen to people of all ages. There are many things you can do to make your home safe and to help prevent falls. What can I do on the outside of my home? Regularly fix the edges of walkways and driveways and fix any cracks. Remove anything that might make you trip as you walk through a door, such as a raised step or threshold. Trim any bushes or trees on the path to your home. Use bright outdoor lighting. Clear any walking paths of anything that might make someone trip, such as rocks or tools. Regularly check to see if handrails are loose or broken. Make sure that both sides of any steps have handrails. Any raised decks and porches should have guardrails on the edges. Have any leaves, snow, or ice cleared regularly. Use sand or salt on walking paths during winter. Clean up any spills in your garage right away. This includes oil or grease spills. What can I do in the bathroom? Use night lights. Install grab  bars by the toilet and in the tub and shower. Do not use towel bars as grab bars. Use non-skid mats or decals in the tub or shower. If you need to sit down in the shower, use a plastic, non-slip stool. Keep the floor dry. Clean up any water that spills on the floor as soon as it happens. Remove soap buildup in the tub or shower regularly. Attach bath mats securely with double-sided non-slip rug tape. Do not have throw rugs and other things on the floor that can make you trip. What can I do in the bedroom? Use night lights. Make sure that you have a light by your bed that is easy to reach. Do not use any sheets or blankets that are too big for your bed. They should not hang down onto the floor. Have a firm chair that has side arms. You can use this for support while you get dressed. Do not have throw rugs and other things on the floor that can make you trip. What can I do in the kitchen? Clean up any spills right away. Avoid walking on wet floors. Keep items that you use a lot in easy-to-reach places. If you need to reach something above you, use a strong step stool that has a grab bar. Keep electrical cords out of the way. Do not use floor polish or wax that makes floors slippery. If you must use wax, use non-skid floor wax. Do not have throw rugs  and other things on the floor that can make you trip. What can I do with my stairs? Do not leave any items on the stairs. Make sure that there are handrails on both sides of the stairs and use them. Fix handrails that are broken or loose. Make sure that handrails are as long as the stairways. Check any carpeting to make sure that it is firmly attached to the stairs. Fix any carpet that is loose or worn. Avoid having throw rugs at the top or bottom of the stairs. If you do have throw rugs, attach them to the floor with carpet tape. Make sure that you have a light switch at the top of the stairs and the bottom of the stairs. If you do not have them,  ask someone to add them for you. What else can I do to help prevent falls? Wear shoes that: Do not have high heels. Have rubber bottoms. Are comfortable and fit you well. Are closed at the toe. Do not wear sandals. If you use a stepladder: Make sure that it is fully opened. Do not climb a closed stepladder. Make sure that both sides of the stepladder are locked into place. Ask someone to hold it for you, if possible. Clearly mark and make sure that you can see: Any grab bars or handrails. First and last steps. Where the edge of each step is. Use tools that help you move around (mobility aids) if they are needed. These include: Canes. Walkers. Scooters. Crutches. Turn on the lights when you go into a dark area. Replace any light bulbs as soon as they burn out. Set up your furniture so you have a clear path. Avoid moving your furniture around. If any of your floors are uneven, fix them. If there are any pets around you, be aware of where they are. Review your medicines with your doctor. Some medicines can make you feel dizzy. This can increase your chance of falling. Ask your doctor what other things that you can do to help prevent falls. This information is not intended to replace advice given to you by your health care provider. Make sure you discuss any questions you have with your health care provider. Document Released: 02/16/2009 Document Revised: 09/28/2015 Document Reviewed: 05/27/2014 Elsevier Interactive Patient Education  2017 [employer].

## 2021-09-24 NOTE — Progress Notes (Signed)
Subjective:   Tiffany Brown is a 76 y.o. female who presents for Medicare Annual (Subsequent) preventive examination.  Virtual Visit via Telephone Note  I connected with  Tiffany Brown on 09/24/21 at  3:30 PM EDT by telephone and verified that I am speaking with the correct person using two identifiers.  Location: Patient: Home Provider: [facility] Persons participating in the virtual visit: patient/Nurse Health Advisor   I discussed the limitations, risks, security and privacy concerns of performing an evaluation and management service by telephone and the availability of in person appointments. The patient expressed understanding and agreed to proceed.  Interactive audio and video telecommunications were attempted between this nurse and patient, however failed, due to patient having technical difficulties OR patient did not have access to video capability.  We continued and completed visit with audio only.  Some vital signs may be absent or patient reported.   Tiffany Golda E Ritik Stavola, LPN   Review of Systems     Cardiac Risk Factors include: advanced age (>87mn, >>38women);diabetes mellitus;dyslipidemia;hypertension;sedentary lifestyle;obesity (BMI >30kg/m2)     Objective:    Today's Vitals   09/24/21 1533 09/24/21 1534  Weight: 268 lb (121.6 kg)   PainSc:  6    Body mass index is 43.26 kg/m.     09/24/2021    3:47 PM 07/31/2021   12:29 PM 05/01/2021    2:15 PM 09/20/2020    9:42 AM 07/22/2019    9:52 AM 06/07/2019   12:16 PM 04/07/2019    9:56 AM  Advanced Directives  Does Patient Have a Medical Advance Directive? No No No No Yes No No  Type of Advance Directive     Living will    Does patient want to make changes to medical advance directive?     No - Patient declined    Would patient like information on creating a medical advance directive? No - Patient declined   Yes (MAU/Ambulatory/Procedural Areas - Information given)  No - Patient declined     Current Medications  (verified) Outpatient Encounter Medications as of 09/24/2021  Medication Sig   amLODipine (NORVASC) 5 MG tablet Take 1 tablet (5 mg total) by mouth daily.   buPROPion (WELLBUTRIN XL) 150 MG 24 hr tablet Take 1 tablet (150 mg total) by mouth daily.   DULoxetine (CYMBALTA) 60 MG capsule Take 2 capsules (120 mg total) by mouth daily.   fluticasone (FLONASE) 50 MCG/ACT nasal spray Place 2 sprays into the nose daily as needed (allergies).    furosemide (LASIX) 40 MG tablet Take 1 tablet (40 mg total) by mouth 2 (two) times daily as needed.   gabapentin (NEURONTIN) 300 MG capsule TAKE 1 CAPSULE BY MOUTH THREE TIMES A DAY   ibuprofen (ADVIL) 800 MG tablet Take 1 tablet (800 mg total) by mouth every 6 (six) hours as needed.   losartan (COZAAR) 100 MG tablet Take 1 tablet (100 mg total) by mouth daily.   methocarbamol (ROBAXIN) 750 MG tablet Take 750 mg by mouth 2 (two) times daily.   omeprazole (PRILOSEC) 40 MG capsule Take 40 mg by mouth every morning.   OZEMPIC, 0.25 OR 0.5 MG/DOSE, 2 MG/3ML SOPN Inject into the skin.   pravastatin (PRAVACHOL) 80 MG tablet Take 1 tablet (80 mg total) by mouth daily.   [DISCONTINUED] meloxicam (MOBIC) 7.5 MG tablet TAKE 1 TABLET BY MOUTH EVERY DAY (Patient not taking: Reported on 09/24/2021)   [DISCONTINUED] oxyCODONE-acetaminophen (PERCOCET) 5-325 MG tablet Take 1 tablet by mouth every 4 (  four) hours as needed for severe pain.   [DISCONTINUED] oxyCODONE-acetaminophen (PERCOCET) 7.5-325 MG tablet Take 1 tablet by mouth every 6 (six) hours as needed.   [DISCONTINUED] traMADol (ULTRAM-ER) 100 MG 24 hr tablet Take 100 mg by mouth daily. (Patient not taking: Reported on 09/24/2021)   [DISCONTINUED] XTAMPZA ER 13.5 MG C12A Take 1 capsule by mouth 2 (two) times daily. (Patient not taking: Reported on 09/24/2021)   No facility-administered encounter medications on file as of 09/24/2021.    Allergies (verified) Other   History: Past Medical History:  Diagnosis Date    Abnormal glucose    Depression with anxiety    Essential hypertension, benign    Fibromyalgia    Melanoma ([place])    Near syncope    Other and unspecified hyperlipidemia    Past Surgical History:  Procedure Laterality Date   ABDOMINAL HYSTERECTOMY     BREAST SURGERY     LEFT   CHOLECYSTECTOMY     ELBOW SURGERY     X's 3 Left   IR ABLATE LIVER CRYOABLATION  02/15/2021   IR RADIOLOGIST EVAL & MGMT  02/08/2021   IR RADIOLOGIST EVAL & MGMT  03/14/2021   RIGHT DISTAL RADIUS FRACTURE     TOTAL KNEE ARTHROPLASTY     RIGHT   Family History  Problem Relation Age of Onset   Cancer Mother        lymphoma   Suicidality Father    Lung cancer Sister    Heart disease Maternal Grandmother        no details   Breast cancer Neg Hx    Social History   Socioeconomic History   Marital status: Married    Spouse name: jr   Number of children: 3   Years of education: 12   Highest education level: 12th grade  Occupational History   Occupation: Homemaker  Tobacco Use   Smoking status: Former    Packs/day: 0.80    Years: 25.00    Pack years: 20.00    Types: Cigarettes    Quit date: 05/06/1994    Years since quitting: 27.4   Smokeless tobacco: Never  Vaping Use   Vaping Use: Never used  Substance and Sexual Activity   Alcohol use: Never   Drug use: Never   Sexual activity: Yes    Birth control/protection: Post-menopausal    Comment: Married since 1964  Other Topics Concern   Not on file  Social History Narrative   Has 3 children. Lives with husband. One daughter lives close by   Social Determinants of Health   Financial Resource Strain: Low Risk    Difficulty of Paying Living Expenses: Not hard at all  Food Insecurity: No Food Insecurity   Worried About Charity fundraiser in the Last Year: Never true   Arboriculturist in the Last Year: Never true  Transportation Needs: No Transportation Needs   Lack of Transportation (Medical): No   Lack of Transportation (Non-Medical): No   Physical Activity: Sufficiently Active   Days of Exercise per Week: 7 days   Minutes of Exercise per Session: 40 min  Stress: No Stress Concern Present   Feeling of Stress : Only a little  Social Connections: Moderately Isolated   Frequency of Communication with Friends and Family: More than three times a week   Frequency of Social Gatherings with Friends and Family: Once a week   Attends Religious Services: Never   Marine scientist or Organizations: No  Attends Archivist Meetings: Never   Marital Status: Married    Tobacco Counseling Counseling given: Not Answered   Clinical Intake:  Pre-visit preparation completed: Yes  Pain : 0-10 Pain Score: 6  Pain Type: Chronic pain Pain Location: Generalized Pain Descriptors / Indicators: Aching, Discomfort Pain Onset: More than a month ago Pain Frequency: Intermittent     BMI - recorded: 43.26 Nutritional Status: BMI > 30  Obese Nutritional Risks: None Diabetes: Yes CBG done?: No Did pt. bring in CBG monitor from home?: No  How often do you need to have someone help you when you read instructions, pamphlets, or other written materials from your doctor or pharmacy?: 1 - Never  Diabetic? Nutrition Risk Assessment:  Has the patient had any N/V/D within the last 2 months?  No  Does the patient have any non-healing wounds?  No  Has the patient had any unintentional weight loss or weight gain?  No   Diabetes:  Is the patient diabetic?  Yes  If diabetic, was a CBG obtained today?  No  Did the patient bring in their glucometer from home?  No  How often do you monitor your CBG's? never.   Financial Strains and Diabetes Management:  Are you having any financial strains with the device, your supplies or your medication? No .  Does the patient want to be seen by Chronic Care Management for management of their diabetes?  No  Would the patient like to be referred to a Nutritionist or for Diabetic Management?  No    Diabetic Exams:  Diabetic Eye Exam: Completed 2022 per patient - we need results.  Diabetic Foot Exam: Pt has been advised about the importance in completing this exam. Pt is scheduled for diabetic foot exam on 10/29/2021 with Dr Warrick Parisian.    Interpreter Needed?: No  Information entered by :: Pressley Tadesse, LPN   Activities of Daily Living    09/24/2021    3:43 PM  In your present state of health, do you have any difficulty performing the following activities:  Hearing? 0  Vision? 0  Difficulty concentrating or making decisions? 1  Walking or climbing stairs? 1  Dressing or bathing? 0  Doing errands, shopping? 1  Preparing Food and eating ? Y  Using the Toilet? N  In the past six months, have you accidently leaked urine? Y  Comment wears depends  Do you have problems with loss of bowel control? N  Managing your Medications? N  Managing your Finances? N  Housekeeping or managing your Housekeeping? Y    Patient Care Team: Dettinger, Fransisca Kaufmann, MD as PCP - General (Family Medicine) Regal, Tamala Fothergill, DPM as Consulting Physician (Podiatry) Posey Pronto Thomasene Lot, DPM as Consulting Physician (Podiatry)  Indicate any recent Medical Services you may have received from other than [facility] providers in the past year (date may be approximate).     Assessment:   This is a routine wellness examination for Shawntee.  Hearing/Vision screen Hearing Screening - Comments:: Denies hearing difficulties   Vision Screening - Comments:: Wears otc reading glasses prn only - up to date with routine eye exams with [facility] in [place] issues and exercise activities discussed: Current Exercise Habits: Home exercise routine, Type of exercise: walking;stretching, Time (Minutes): 40, Frequency (Times/Week): 7, Weekly Exercise (Minutes/Week): 280, Intensity: Mild, Exercise limited by: orthopedic condition(s);psychological condition(s)   Goals Addressed               This Visit's Progress  Increase physical activity (pt-stated)   On track     Gradually as Lymphedema gets better.      Prevent falls (pt-stated)   On track     Now walking with walker, be aware of surroundings - Increase exercise as tolerated, lose weight , be able to get around easier without walker       Depression Screen    09/24/2021    3:40 PM 07/25/2021    1:32 PM 04/26/2021    1:55 PM 01/25/2021    1:31 PM 01/25/2021    1:07 PM 10/25/2020   10:15 AM 09/20/2020    9:18 AM  PHQ 2/9 Scores  PHQ - 2 Score '2 6 2 2 3 3 3  '$ PHQ- 9 Score '7 26 7 7 14 14 14    '$ Fall Risk    09/24/2021    3:38 PM 07/25/2021    1:32 PM 04/26/2021    1:55 PM 01/25/2021    1:06 PM 10/25/2020   10:15 AM  [place] in the past year? 1 1 0 0 1  Number falls in past yr: 1 0   1  Injury with Fall? 0 0   0  Risk for fall due to : History of fall(s);Impaired balance/gait;Orthopedic patient;Medication side effect Impaired balance/gait;Orthopedic patient   Impaired balance/gait;Medication side effect;Orthopedic patient;History of fall(s)  Follow up Education provided;Falls prevention discussed Falls evaluation completed   Falls evaluation completed    FALL RISK PREVENTION PERTAINING TO THE HOME:  Any stairs in or around the home? Yes  If so, are there any without handrails? No  Home free of loose throw rugs in walkways, pet beds, electrical cords, etc? Yes  Adequate lighting in your home to reduce risk of falls? Yes   ASSISTIVE DEVICES UTILIZED TO PREVENT FALLS:  Life alert? No  Use of a cane, walker or w/c? Yes  Grab bars in the bathroom? Yes  Shower chair or bench in shower? Yes  Elevated toilet seat or a handicapped toilet? Yes   TIMED UP AND GO:  Was the test performed? No . Telephonic visit  Cognitive Function:        09/24/2021    3:42 PM 07/22/2019    9:55 AM 07/22/2019    9:42 AM  6CIT Screen  What Year? 0 points 0 points 0 points  What month? 0 points 0 points 0 points  What time? 0 points 0  points   Count back from 20 0 points 0 points   Months in reverse 0 points 2 points   Repeat phrase 2 points 0 points   Total Score 2 points 2 points     Immunizations Immunization History  Administered Date(s) Administered   Fluad Quad(high Dose 65+) 01/24/2020   Influenza, High Dose Seasonal PF 03/06/2016, 01/28/2017, 02/12/2018, 02/25/2019   Influenza,inj,Quad PF,6+ Mos 01/25/2021   Influenza,trivalent, recombinat, inj, PF 02/10/2014, 02/07/2015   Influenza-Unspecified 02/10/2014, 02/07/2015   Moderna Sars-Covid-2 Vaccination 06/30/2019, 07/28/2019, 03/07/2020   Pneumococcal Conjugate-13 07/03/2015   Pneumococcal Polysaccharide-23 08/05/2016   Td,absorbed, Preservative Free, Adult Use, Lf Unspecified 11/16/2004   Tdap 02/10/2014, 05/03/2019   Zoster Recombinat (Shingrix) 10/25/2020, 07/25/2021    TDAP status: Up to date  Flu Vaccine status: Up to date  Pneumococcal vaccine status: Up to date  Covid-19 vaccine status: Completed vaccines  Qualifies for Shingles Vaccine? Yes   Zostavax completed Yes   Shingrix Completed?: Yes  Screening Tests Health Maintenance  Topic Date Due   GGYIR-48  Vaccine (4 - Booster for Moderna series) 10/24/2021 (Originally 05/02/2020)   Hepatitis C Screening  04/26/2022 (Originally 11/02/1963)   INFLUENZA VACCINE  12/04/2021   HEMOGLOBIN A1C  01/25/2022   DEXA SCAN  05/02/2022   MAMMOGRAM  08/14/2022   TETANUS/TDAP  05/02/2029   Pneumonia Vaccine 56+ Years old  Completed   Zoster Vaccines- Shingrix  Completed   HPV VACCINES  Aged Out   FOOT EXAM  Discontinued   OPHTHALMOLOGY EXAM  Discontinued   COLONOSCOPY (Pts 45-80yr Insurance coverage will need to be confirmed)  Discontinued    Health Maintenance  There are no preventive care reminders to display for this patient.  Colorectal cancer screening: Type of screening: Colonoscopy. Completed 05/07/2012. Repeat every 10 years  Mammogram status: Completed 08/13/2021. Repeat every  year  Bone Density status: Completed 05/02/2020. Results reflect: Bone density results: OSTEOPENIA. Repeat every 2 years.  Lung Cancer Screening: (Low Dose CT Chest recommended if Age 77-80years, 30 pack-year currently smoking OR have quit w/in 15years.) does not qualify.  Additional Screening:  Hepatitis C Screening: does qualify; DUE  Vision Screening: Recommended annual ophthalmology exams for early detection of glaucoma and other disorders of the eye. Is the patient up to date with their annual eye exam?  Yes  Who is the provider or what is the name of the office in which the patient attends annual eye exams? [facility] If pt is not established with a provider, would they like to be referred to a provider to establish care? No .   Dental Screening: Recommended annual dental exams for proper oral hygiene  Community Resource Referral / Chronic Care Management: CRR required this visit?  No   CCM required this visit?  No      Plan:     I have personally reviewed and noted the following in the patient's chart:   Medical and social history Use of alcohol, tobacco or illicit drugs  Current medications and supplements including opioid prescriptions.  Functional ability and status Nutritional status Physical activity Advanced directives List of other physicians Hospitalizations, surgeries, and ER visits in previous 12 months Vitals Screenings to include cognitive, depression, and falls Referrals and appointments  In addition, I have reviewed and discussed with patient certain preventive protocols, quality metrics, and best practice recommendations. A written personalized care plan for preventive services as well as general preventive health recommendations were provided to patient.     ASandrea Hammond LPN   [id number]  Nurse Notes: None

## 2021-10-03 ENCOUNTER — Ambulatory Visit: Payer: Medicare Other | Admitting: Podiatry

## 2021-10-03 DIAGNOSIS — M7671 Peroneal tendinitis, right leg: Secondary | ICD-10-CM | POA: Diagnosis not present

## 2021-10-03 DIAGNOSIS — M6780 Other specified disorders of synovium and tendon, unspecified site: Secondary | ICD-10-CM

## 2021-10-03 DIAGNOSIS — M6240 Contracture of muscle, unspecified site: Secondary | ICD-10-CM | POA: Diagnosis not present

## 2021-10-08 ENCOUNTER — Other Ambulatory Visit: Payer: Self-pay | Admitting: General Surgery

## 2021-10-08 DIAGNOSIS — C4362 Malignant melanoma of left upper limb, including shoulder: Secondary | ICD-10-CM | POA: Diagnosis not present

## 2021-10-09 NOTE — Progress Notes (Signed)
Subjective:  Patient ID: Tiffany Brown, female    DOB: 08-09-1945,  MRN: [id number]  Chief Complaint  Patient presents with   Routine Post Op    76 y.o. female presents with the above complaint.  Patient presents with complaint of peroneal weakness to the right foot status post peroneal tendon repair on 12/18/2020 by me.  She has failed all conservative treatment options including bracing protecting injections at this time she would like to discuss revisional surgery and go over the MRI Review of Systems: Negative except as noted in the HPI. Denies N/V/F/Ch.  Past Medical History:  Diagnosis Date   Abnormal glucose    Depression with anxiety    Essential hypertension, benign    Fibromyalgia    Melanoma ([place])    Near syncope    Other and unspecified hyperlipidemia     Current Outpatient Medications:    amLODipine (NORVASC) 5 MG tablet, Take 1 tablet (5 mg total) by mouth daily., Disp: 90 tablet, Rfl: 3   buPROPion (WELLBUTRIN XL) 150 MG 24 hr tablet, Take 1 tablet (150 mg total) by mouth daily., Disp: 90 tablet, Rfl: 3   DULoxetine (CYMBALTA) 60 MG capsule, Take 2 capsules (120 mg total) by mouth daily., Disp: 180 capsule, Rfl: 3   fluticasone (FLONASE) 50 MCG/ACT nasal spray, Place 2 sprays into the nose daily as needed (allergies). , Disp: , Rfl:    furosemide (LASIX) 40 MG tablet, Take 1 tablet (40 mg total) by mouth 2 (two) times daily as needed., Disp: 180 tablet, Rfl: 3   gabapentin (NEURONTIN) 300 MG capsule, TAKE 1 CAPSULE BY MOUTH THREE TIMES A DAY, Disp: 90 capsule, Rfl: 3   ibuprofen (ADVIL) 800 MG tablet, Take 1 tablet (800 mg total) by mouth every 6 (six) hours as needed., Disp: 60 tablet, Rfl: 1   losartan (COZAAR) 100 MG tablet, Take 1 tablet (100 mg total) by mouth daily., Disp: 90 tablet, Rfl: 3   methocarbamol (ROBAXIN) 750 MG tablet, Take 750 mg by mouth 2 (two) times daily., Disp: , Rfl:    omeprazole (PRILOSEC) 40 MG capsule, Take 40 mg by mouth every morning.,  Disp: , Rfl:    OZEMPIC, 0.25 OR 0.5 MG/DOSE, 2 MG/3ML SOPN, Inject into the skin., Disp: , Rfl:    pravastatin (PRAVACHOL) 80 MG tablet, Take 1 tablet (80 mg total) by mouth daily., Disp: 90 tablet, Rfl: 3  Social History   Tobacco Use  Smoking Status Former   Packs/day: 0.80   Years: 25.00   Pack years: 20.00   Types: Cigarettes   Quit date: 05/06/1994   Years since quitting: 27.4  Smokeless Tobacco Never    Allergies  Allergen Reactions   Other    Objective:  There were no vitals filed for this visit. There is no height or weight on file to calculate BMI. Constitutional Well developed. Well nourished.  Vascular Dorsalis pedis pulses palpable bilaterally. Posterior tibial pulses palpable bilaterally. Capillary refill normal to all digits.  No cyanosis or clubbing noted. Pedal hair growth normal.  Neurologic Normal speech. Oriented to person, place, and time. Epicritic sensation to light touch grossly present bilaterally.  Dermatologic Nails well groomed and normal in appearance. No open wounds. No skin lesions.  Orthopedic: Residual weakness of the peroneal tendon noted to the right lower extremity.  Weakness noted to the active dorsiflexion eversion of the foot.  4 out of 5.  Gait examination shows slightly pes cavovarus foot structure likely due to peroneal weakness and  overpowering of the posterior tibial tendon. Increase strength of the posterior tibial tendon noted 5 out of 5.  This is likely due to weakening of the peroneal tendon   Radiographs: IMPRESSION: 1. New short-segment linear partial-thickness tear of the peroneus brevis tendon starting approximately 1.8 cm distal to the fibula. 2. Attenuation of the peroneus longus tendon as it courses inferior to the calcaneus and cuboid, suspicious for at least high-grade partial-thickness tear where previously a full-thickness tear was seen on 11/02/2020 MRI. 3. New minimal distal Achilles insertional tendinosis. 4.  Unchanged tiny linear partial-thickness tear of the distal posterior tibial tendon. 5. Worsened severe anteromedial tibiotalar osteoarthritis with new varus angulation of the tibiotalar joint. Assessment:   1. Peroneal tendinitis, right   2. Tendon weakness   3. Tightness of tendon      Plan:  Patient was evaluated and treated and all questions answered.  Right cavovarus foot type status post weakness of the peroneal tendon with history of peroneal tendon repair -All questions and concerns were discussed with the patient in extensive detail. -MRI was reviewed with the patient which shows new tearing of the peroneal brevis tendon which could be leading to the cavovarus foot structure.  There is also some partial tearing of the longus tendon as well.  I discussed with the patient that I can evaluate both tendon and repair them if possible.  We may ultimately have to do tenodesis of both of the tendons.  I discussed this as well as lengthening of the posterior tibial tendon.  I discussed with the patient that given the tightening of the posterior tibial tendon in the setting of weakness of the peroneal tendon I believe she will benefit from lengthening of the posterior tibial tendon as well.  This is to keep the foot in a normal alignment.  She has failed all conservative treatment options she agrees with the plan like to proceed with surgical options. -I discussed my preoperative intraoperative postop plan in extensive detail.  She would like to proceed with the surgery -She will be nonweightbearing to the right lower extremity -Informed surgical risk consent was reviewed and read aloud to the patient.  I reviewed the films.  I have discussed my findings with the patient in great detail.  I have discussed all risks including but not limited to infection, stiffness, scarring, limp, disability, deformity, damage to blood vessels and nerves, numbness, poor healing, need for braces, arthritis, chronic  pain, amputation, death.  All benefits and realistic expectations discussed in great detail.  I have made no promises as to the outcome.  I have provided realistic expectations.  I have offered the patient a 2nd opinion, which they have declined and assured me they preferred to proceed despite the risks   No follow-ups on file.

## 2021-10-10 ENCOUNTER — Telehealth: Payer: Self-pay | Admitting: Urology

## 2021-10-10 NOTE — Telephone Encounter (Signed)
DOS - 10/29/21  REPAIR TIBAL TENDON RIGHT --- 83672 LENGTHENING TENDON RIGHT --- 55001  [facility] EFFECTIVE DATE - 05/06/21   PLAN DEDUCTIBLE - $0.00 OUT OF POCKET - $3,600.00 W/ $2,744.49 REMAINING COINSURANCE - 0% COPAY - $295.00  PER [employer] WEBSITE FOR CPT CODES 64290 AND 37955 Notification or Prior Authorization is not required for the requested services  Decision ID #:[id number]

## 2021-10-23 ENCOUNTER — Encounter (HOSPITAL_COMMUNITY): Payer: Self-pay | Admitting: General Surgery

## 2021-10-23 ENCOUNTER — Other Ambulatory Visit: Payer: Self-pay

## 2021-10-23 NOTE — Pre-Procedure Instructions (Signed)
[place] [place] [address] Phone: [phone] Fax: [phone]  [id number] [id number]: [phone]: [phone]  PCP - JFransisca KaufmannDettinger, MD  [place] - [place] until 1230  Anesthesia review: N  Patient verbally denies any shortness of breath, fever, cough and chest pain during phone call   -------------  SDW INSTRUCTIONS given:  Your procedure is scheduled on 10/24/21.  [facility] at 1 [facility].M., and check in at the Admitting office.  Call this number if you have problems the morning of surgery:  [phone]   Remember:  Do not eat after midnight the night before your surgery  You may drink clear liquids until 1230 the afternoon of your surgery.   Clear liquids allowed are: Water, Non-Citrus Juices (without pulp), Carbonated Beverages, Clear Tea, Black Coffee Only, and Gatorade    Take these medicines the morning of surgery with A SIP OF WATER  amLODipine (NORVASC)  buPROPion (WELLBUTRIN XL)  DULoxetine (CYMBALTA) gabapentin (NEURONTIN)  omeprazole (PRILOSEC)  pravastatin (PRAVACHOL)   As of today, STOP taking any Aspirin (unless otherwise instructed by your surgeon) Aleve, Naproxen, Ibuprofen, Motrin, Advil, Goody's, BC's, all herbal medications, fish oil, and all vitamins.                      Do not wear jewelry, make up, or nail polish            Do not wear lotions, powders, perfumes/colognes, or deodorant.            Do not shave 48 hours prior to surgery.  Men may shave face and neck.            Do not bring valuables to the hospital.            [facility] belongings or [facility]ables.  Do NOT Smoke (Tobacco/Vaping) 24 hours prior to your procedure If you use a CPAP at night, you may bring all equipment for your overnight stay.   Contacts,  glasses, dentures or bridgework may not be worn into surgery.      For patients admitted to the hospital, discharge time will be determined by your treatment team.   Patients discharged the day of surgery will not be allowed to drive home, and someone needs to stay with them for 24 hours.    Special instructions:   [place]- Preparing For Surgery  Before surgery, you can play an important role. Because skin is not sterile, your skin needs to be as free of germs as possible. You can reduce the number of germs on your skin by washing with CHG (chlorahexidine gluconate) Soap before surgery.  CHG is an antiseptic cleaner which kills germs and bonds with the skin to continue killing germs even after washing.    Oral Hygiene is also important to reduce your risk of infection.  Remember - BRUSH YOUR TEETH THE MORNING OF SURGERY WITH YOUR REGULAR TOOTHPASTE  Please do not use if you have an allergy to CHG or antibacterial soaps. If your skin becomes reddened/irritated stop using the CHG.  Do not shave (including legs and underarms) for at least 48 hours prior to first CHG shower. It is OK to shave your face.  Please follow these instructions carefully.  Shower the NIGHT BEFORE SURGERY and the MORNING OF SURGERY with DIAL Soap.   Pat yourself dry with a CLEAN TOWEL.  Wear CLEAN PAJAMAS to bed the night before surgery  Place CLEAN SHEETS on your bed the night of your first shower and DO NOT SLEEP WITH PETS.   Day of Surgery: Please shower morning of surgery  Wear Clean/Comfortable clothing the morning of surgery Do not apply any deodorants/lotions.   Remember to brush your teeth WITH YOUR REGULAR TOOTHPASTE.   Questions were answered. Patient verbalized understanding of instructions.

## 2021-10-24 ENCOUNTER — Ambulatory Visit (HOSPITAL_BASED_OUTPATIENT_CLINIC_OR_DEPARTMENT_OTHER): Payer: Medicare Other | Admitting: Anesthesiology

## 2021-10-24 ENCOUNTER — Other Ambulatory Visit: Payer: Self-pay

## 2021-10-24 ENCOUNTER — Ambulatory Visit (HOSPITAL_COMMUNITY): Payer: Medicare Other | Admitting: Anesthesiology

## 2021-10-24 ENCOUNTER — Encounter (HOSPITAL_COMMUNITY): Payer: Self-pay | Admitting: General Surgery

## 2021-10-24 ENCOUNTER — Encounter (HOSPITAL_COMMUNITY)
Admission: RE | Disposition: A | Discharge status: Home / Self Care | Payer: Self-pay | Source: Home / Self Care | Attending: General Surgery

## 2021-10-24 ENCOUNTER — Ambulatory Visit (HOSPITAL_COMMUNITY)
Admission: RE | Admit: 2021-10-24 | Discharge: 2021-10-24 | Disposition: A | Discharge status: Home / Self Care | Payer: Medicare Other | Attending: General Surgery | Admitting: General Surgery

## 2021-10-24 DIAGNOSIS — F419 Anxiety disorder, unspecified: Secondary | ICD-10-CM | POA: Diagnosis not present

## 2021-10-24 DIAGNOSIS — E119 Type 2 diabetes mellitus without complications: Secondary | ICD-10-CM | POA: Diagnosis not present

## 2021-10-24 DIAGNOSIS — E11620 Type 2 diabetes mellitus with diabetic dermatitis: Secondary | ICD-10-CM | POA: Diagnosis not present

## 2021-10-24 DIAGNOSIS — M797 Fibromyalgia: Secondary | ICD-10-CM | POA: Insufficient documentation

## 2021-10-24 DIAGNOSIS — C4362 Malignant melanoma of left upper limb, including shoulder: Secondary | ICD-10-CM

## 2021-10-24 DIAGNOSIS — I872 Venous insufficiency (chronic) (peripheral): Secondary | ICD-10-CM | POA: Insufficient documentation

## 2021-10-24 DIAGNOSIS — F32A Depression, unspecified: Secondary | ICD-10-CM | POA: Insufficient documentation

## 2021-10-24 DIAGNOSIS — K219 Gastro-esophageal reflux disease without esophagitis: Secondary | ICD-10-CM | POA: Diagnosis not present

## 2021-10-24 DIAGNOSIS — F418 Other specified anxiety disorders: Secondary | ICD-10-CM

## 2021-10-24 DIAGNOSIS — L98499 Non-pressure chronic ulcer of skin of other sites with unspecified severity: Secondary | ICD-10-CM | POA: Diagnosis not present

## 2021-10-24 DIAGNOSIS — I10 Essential (primary) hypertension: Secondary | ICD-10-CM

## 2021-10-24 DIAGNOSIS — M1990 Unspecified osteoarthritis, unspecified site: Secondary | ICD-10-CM | POA: Insufficient documentation

## 2021-10-24 DIAGNOSIS — Z87891 Personal history of nicotine dependence: Secondary | ICD-10-CM | POA: Insufficient documentation

## 2021-10-24 DIAGNOSIS — Z6841 Body Mass Index (BMI) 40.0 and over, adult: Secondary | ICD-10-CM | POA: Diagnosis not present

## 2021-10-24 DIAGNOSIS — D0362 Melanoma in situ of left upper limb, including shoulder: Secondary | ICD-10-CM | POA: Diagnosis not present

## 2021-10-24 HISTORY — DX: Lymphedema, not elsewhere classified: I89.0

## 2021-10-24 HISTORY — DX: Prediabetes: R73.03

## 2021-10-24 LAB — COMPREHENSIVE METABOLIC PANEL
ALT: 22 U/L (ref 0–44)
AST: 25 U/L (ref 15–41)
Albumin: 3.7 g/dL (ref 3.5–5.0)
Alkaline Phosphatase: 124 U/L (ref 38–126)
Anion gap: 11 (ref 5–15)
BUN: 17 mg/dL (ref 8–23)
CO2: 25 mmol/L (ref 22–32)
Calcium: 9.2 mg/dL (ref 8.9–10.3)
Chloride: 107 mmol/L (ref 98–111)
Creatinine, Ser: 0.74 mg/dL (ref 0.44–1.00)
GFR, Estimated: >60 mL/min (ref >60)
Glucose, Bld: 114 mg/dL — ABNORMAL HIGH (ref 70–99)
Potassium: 3.7 mmol/L (ref 3.5–5.1)
Sodium: 143 mmol/L (ref 135–145)
Total Bilirubin: 0.3 mg/dL (ref 0.3–1.2)
Total Protein: 6.8 g/dL (ref 6.5–8.1)

## 2021-10-24 LAB — CBC WITH DIFFERENTIAL/PLATELET
Abs Immature Granulocytes: 0.05 10*3/uL (ref 0.00–0.07)
Basophils Absolute: 0 10*3/uL (ref 0.0–0.1)
Basophils Relative: 0 %
Eosinophils Absolute: 0.2 10*3/uL (ref 0.0–0.5)
Eosinophils Relative: 1 %
HCT: 42 % (ref 36.0–46.0)
Hemoglobin: 13.7 g/dL (ref 12.0–15.0)
Immature Granulocytes: 0 %
Lymphocytes Relative: 25 %
Lymphs Abs: 2.9 10*3/uL (ref 0.7–4.0)
MCH: 31.4 pg (ref 26.0–34.0)
MCHC: 32.6 g/dL (ref 30.0–36.0)
MCV: 96.1 fL (ref 80.0–100.0)
Monocytes Absolute: 0.7 10*3/uL (ref 0.1–1.0)
Monocytes Relative: 6 %
Neutro Abs: 7.7 10*3/uL (ref 1.7–7.7)
Neutrophils Relative %: 68 %
Platelets: 195 10*3/uL (ref 150–400)
RBC: 4.37 MIL/uL (ref 3.87–5.11)
RDW: 13.4 % (ref 11.5–15.5)
WBC: 11.6 10*3/uL — ABNORMAL HIGH (ref 4.0–10.5)
nRBC: 0 % (ref 0.0–0.2)

## 2021-10-24 SURGERY — EXCISION, MELANOMA
Anesthesia: General | Site: Shoulder | Laterality: Left

## 2021-10-24 MED ORDER — DEXAMETHASONE SODIUM PHOSPHATE 10 MG/ML IJ SOLN
INTRAMUSCULAR | Status: AC
Start: 2021-10-24 — End: ?
  Filled 2021-10-24: qty 1

## 2021-10-24 MED ORDER — DEXAMETHASONE SODIUM PHOSPHATE 10 MG/ML IJ SOLN
INTRAMUSCULAR | Status: DC | PRN
  Administered 2021-10-24: 5 mg via INTRAVENOUS

## 2021-10-24 MED ORDER — CEFAZOLIN IN SODIUM CHLORIDE 3-0.9 GM/100ML-% IV SOLN
3.0000 g | INTRAVENOUS | Status: AC
  Administered 2021-10-24: 3 g via INTRAVENOUS
  Filled 2021-10-24: qty 100

## 2021-10-24 MED ORDER — LIDOCAINE HCL (PF) 1 % IJ SOLN
INTRAMUSCULAR | Status: AC
  Filled 2021-10-24: qty 30

## 2021-10-24 MED ORDER — 0.9 % SODIUM CHLORIDE (POUR BTL) OPTIME
TOPICAL | Status: DC | PRN
  Administered 2021-10-24: 1000 mL

## 2021-10-24 MED ORDER — ONDANSETRON HCL 4 MG/2ML IJ SOLN
INTRAMUSCULAR | Status: DC | PRN
  Administered 2021-10-24: 4 mg via INTRAVENOUS

## 2021-10-24 MED ORDER — CHLORHEXIDINE GLUCONATE 0.12 % MT SOLN
15.0000 mL | OROMUCOSAL | Status: AC
  Administered 2021-10-24: 15 mL via OROMUCOSAL
  Filled 2021-10-24 (×2): qty 15

## 2021-10-24 MED ORDER — PHENYLEPHRINE 80 MCG/ML (10ML) SYRINGE FOR IV PUSH (FOR BLOOD PRESSURE SUPPORT)
PREFILLED_SYRINGE | INTRAVENOUS | Status: AC
  Filled 2021-10-24: qty 10

## 2021-10-24 MED ORDER — PHENYLEPHRINE 80 MCG/ML (10ML) SYRINGE FOR IV PUSH (FOR BLOOD PRESSURE SUPPORT)
PREFILLED_SYRINGE | INTRAVENOUS | Status: DC | PRN
  Administered 2021-10-24: 160 ug via INTRAVENOUS
  Administered 2021-10-24 (×2): 240 ug via INTRAVENOUS
  Administered 2021-10-24: 160 ug via INTRAVENOUS
  Administered 2021-10-24 (×2): 240 ug via INTRAVENOUS

## 2021-10-24 MED ORDER — ONDANSETRON HCL 4 MG/2ML IJ SOLN
INTRAMUSCULAR | Status: AC
  Filled 2021-10-24: qty 2

## 2021-10-24 MED ORDER — FENTANYL CITRATE (PF) 250 MCG/5ML IJ SOLN
INTRAMUSCULAR | Status: AC
  Filled 2021-10-24: qty 5

## 2021-10-24 MED ORDER — PROPOFOL 10 MG/ML IV BOLUS
INTRAVENOUS | Status: DC | PRN
  Administered 2021-10-24: 150 mg via INTRAVENOUS

## 2021-10-24 MED ORDER — LIDOCAINE 2% (20 MG/ML) 5 ML SYRINGE
INTRAMUSCULAR | Status: DC | PRN
  Administered 2021-10-24: 100 mg via INTRAVENOUS

## 2021-10-24 MED ORDER — FENTANYL CITRATE (PF) 250 MCG/5ML IJ SOLN
INTRAMUSCULAR | Status: DC | PRN
  Administered 2021-10-24: 50 ug via INTRAVENOUS
  Administered 2021-10-24: 25 ug via INTRAVENOUS

## 2021-10-24 MED ORDER — ACETAMINOPHEN 500 MG PO TABS
1000.0000 mg | ORAL_TABLET | Freq: Once | ORAL | Status: AC
  Administered 2021-10-24: 1000 mg via ORAL
  Filled 2021-10-24: qty 2

## 2021-10-24 MED ORDER — LIDOCAINE 2% (20 MG/ML) 5 ML SYRINGE
INTRAMUSCULAR | Status: AC
  Filled 2021-10-24: qty 5

## 2021-10-24 MED ORDER — LIDOCAINE HCL 1 % IJ SOLN: INTRAMUSCULAR | Status: DC | PRN

## 2021-10-24 MED ORDER — BUPIVACAINE-EPINEPHRINE (PF) 0.25% -1:200000 IJ SOLN
INTRAMUSCULAR | Status: AC
  Filled 2021-10-24: qty 30

## 2021-10-24 MED ORDER — EPHEDRINE SULFATE-NACL 50-0.9 MG/10ML-% IV SOSY
PREFILLED_SYRINGE | INTRAVENOUS | Status: DC | PRN
  Administered 2021-10-24: 10 mg via INTRAVENOUS
  Administered 2021-10-24: 15 mg via INTRAVENOUS

## 2021-10-24 MED ORDER — OXYCODONE HCL 5 MG PO TABS: 5.0000 mg | ORAL_TABLET | Freq: Four times a day (QID) | ORAL | 0 refills | Status: DC | PRN

## 2021-10-24 MED ORDER — PROPOFOL 10 MG/ML IV BOLUS
INTRAVENOUS | Status: AC
  Filled 2021-10-24: qty 20

## 2021-10-24 MED ORDER — CHLORHEXIDINE GLUCONATE CLOTH 2 % EX PADS: 6.0000 | MEDICATED_PAD | Freq: Once | CUTANEOUS | Status: DC

## 2021-10-24 MED ORDER — LACTATED RINGERS IV SOLN: INTRAVENOUS | Status: DC

## 2021-10-24 MED ORDER — FENTANYL CITRATE (PF) 100 MCG/2ML IJ SOLN: 25.0000 ug | INTRAMUSCULAR | Status: DC | PRN

## 2021-10-24 MED ORDER — ACETAMINOPHEN 500 MG PO TABS: 1000.0000 mg | ORAL_TABLET | ORAL | Status: DC

## 2021-10-24 SURGICAL SUPPLY — 46 items
ADH SKN CLS APL DERMABOND .7 (GAUZE/BANDAGES/DRESSINGS)
APL PRP STRL LF DISP 70% ISPRP (MISCELLANEOUS) ×1
APL SKNCLS STERI-STRIP NONHPOA (GAUZE/BANDAGES/DRESSINGS) ×1
BAG COUNTER SPONGE SURGICOUNT (BAG) ×2 IMPLANT
BAG SPNG CNTER NS LX DISP (BAG) ×1
BENZOIN TINCTURE PRP APPL 2/3 (GAUZE/BANDAGES/DRESSINGS) ×2 IMPLANT
CANISTER SUCT 3000ML PPV (MISCELLANEOUS) ×2 IMPLANT
CHLORAPREP W/TINT 26 (MISCELLANEOUS) ×2 IMPLANT
CNTNR URN SCR LID CUP LEK RST (MISCELLANEOUS) IMPLANT
CONT SPEC 4OZ STRL OR WHT (MISCELLANEOUS) ×2
COVER SURGICAL LIGHT HANDLE (MISCELLANEOUS) ×2 IMPLANT
DERMABOND ADVANCED (GAUZE/BANDAGES/DRESSINGS)
DERMABOND ADVANCED .7 DNX12 (GAUZE/BANDAGES/DRESSINGS) IMPLANT
DRAPE LAPAROTOMY 100X72 PEDS (DRAPES) ×1 IMPLANT
DRSG TEGADERM 4X4.75 (GAUZE/BANDAGES/DRESSINGS) ×2 IMPLANT
ELECT REM PT RETURN 9FT ADLT (ELECTROSURGICAL) ×2
ELECTRODE REM PT RTRN 9FT ADLT (ELECTROSURGICAL) ×1 IMPLANT
GAUZE 4X4 16PLY ~~LOC~~+RFID DBL (SPONGE) ×2 IMPLANT
GAUZE SPONGE 4X4 12PLY STRL (GAUZE/BANDAGES/DRESSINGS) ×1 IMPLANT
GLOVE BIO SURGEON STRL SZ 6 (GLOVE) ×2 IMPLANT
GLOVE INDICATOR 6.5 STRL GRN (GLOVE) ×2 IMPLANT
GOWN STRL REUS W/ TWL LRG LVL3 (GOWN DISPOSABLE) ×1 IMPLANT
GOWN STRL REUS W/TWL 2XL LVL3 (GOWN DISPOSABLE) ×2 IMPLANT
GOWN STRL REUS W/TWL LRG LVL3 (GOWN DISPOSABLE) ×2
KIT BASIN OR (CUSTOM PROCEDURE TRAY) ×2 IMPLANT
KIT TURNOVER KIT B (KITS) ×2 IMPLANT
MARKER SKIN DUAL TIP RULER LAB (MISCELLANEOUS) ×2 IMPLANT
NDL HYPO 25GX1X1/2 BEV (NEEDLE) ×2 IMPLANT
NEEDLE HYPO 25GX1X1/2 BEV (NEEDLE) ×4 IMPLANT
NS IRRIG 1000ML POUR BTL (IV SOLUTION) ×2 IMPLANT
PACK GENERAL/GYN (CUSTOM PROCEDURE TRAY) ×2 IMPLANT
PAD ARMBOARD 7.5X6 YLW CONV (MISCELLANEOUS) ×4 IMPLANT
PENCIL SMOKE EVACUATOR (MISCELLANEOUS) ×2 IMPLANT
POWDER MYRIAD MORCELLS 1000MG (Miscellaneous) ×1 IMPLANT
SPECIMEN JAR SMALL (MISCELLANEOUS) ×2 IMPLANT
STRIP CLOSURE SKIN 1/2X4 (GAUZE/BANDAGES/DRESSINGS) ×1 IMPLANT
SUT ETHILON 2 0 FS 18 (SUTURE) ×2 IMPLANT
SUT ETHILON 2 0 PSLX (SUTURE) ×1 IMPLANT
SUT MNCRL AB 4-0 PS2 18 (SUTURE) ×2 IMPLANT
SUT SILK 2 0 PERMA HAND 18 BK (SUTURE) ×1 IMPLANT
SUT VIC AB 2-0 SH 27 (SUTURE) ×4
SUT VIC AB 2-0 SH 27XBRD (SUTURE) ×1 IMPLANT
SUT VIC AB 3-0 SH 27 (SUTURE) ×4
SUT VIC AB 3-0 SH 27X BRD (SUTURE) ×1 IMPLANT
SYR CONTROL 10ML LL (SYRINGE) ×4 IMPLANT
TOWEL GREEN STERILE FF (TOWEL DISPOSABLE) ×2 IMPLANT

## 2021-10-24 NOTE — Anesthesia Preprocedure Evaluation (Addendum)
Anesthesia Evaluation  Patient identified by MRN, date of birth, ID band Patient awake    Reviewed: Allergy & Precautions, NPO status , Patient's Chart, lab work & pertinent test results  Airway Mallampati: III  TM Distance: >3 FB Neck ROM: Full    Dental  (+) Dental Advisory Given, Loose,    Pulmonary neg pulmonary ROS, former smoker,    Pulmonary exam normal breath sounds clear to auscultation       Cardiovascular hypertension, Pt. on medications Normal cardiovascular exam Rhythm:Regular Rate:Normal     Neuro/Psych PSYCHIATRIC DISORDERS Anxiety Depression negative neurological ROS     GI/Hepatic Neg liver ROS, GERD  Medicated and Controlled,  Endo/Other  diabetesMorbid obesity (BMI 44)  Renal/GU negative Renal ROS  negative genitourinary   Musculoskeletal  (+) Arthritis , Fibromyalgia -  Abdominal   Peds  Hematology negative hematology ROS (+)   Anesthesia Other Findings   Reproductive/Obstetrics                            Anesthesia Physical Anesthesia Plan  ASA: 3  Anesthesia Plan: General   Post-op Pain Management: Tylenol PO (pre-op)*   Induction: Intravenous  PONV Risk Score and Plan: 3 and Ondansetron, Dexamethasone and Treatment may vary due to age or medical condition  Airway Management Planned: LMA  Additional Equipment:   Intra-op Plan:   Post-operative Plan: Extubation in OR  Informed Consent: I have reviewed the patients History and Physical, chart, labs and discussed the procedure including the risks, benefits and alternatives for the proposed anesthesia with the patient or authorized representative who has indicated his/her understanding and acceptance.     Dental advisory given  Plan Discussed with: CRNA  Anesthesia Plan Comments:         Anesthesia Quick Evaluation

## 2021-10-24 NOTE — H&P (Signed)
REFERRING PHYSICIAN:  Dermatology, Allyson Sabal   PROVIDER:  Georgianne Fick, MD   MRN: [id number] DOB: 01/22/1946 DATE OF ENCOUNTER: 10/08/2021 Subjective  Chief Complaint: Melanoma   History of Present Illness: Tiffany Brown is a 76 y.o. female who is seen today as an office consultation for evaluation of Melanoma   Patient presents with a new diagnosis of malignant melanoma of the left upper arm May 2023.  She states that she had a brownish lesion, up on her left shoulder around 2 to 3 months ago.  It started to get bumpy and get larger.  She described it as an irregular brown color.  She is seen frequently in dermatology clinic anyway for stasis dermatitis to the lower extremities and multiple basal and squamous cell carcinomas on the face.  Shave biopsy was performed and this demonstrated a 0.8 mm Breslow's thickness melanoma described below.  She has had melanoma in the past on the right forearm.  She underwent wide local excision of that as well and did not require lymph node biopsy.  She thinks this was between 10 and 15 years ago.   She is undergoing physical therapy because of left knee issues as well as right foot issues.  She needs a left knee replacement as well as a second right foot surgery.  She uses a walker all the time.  She is working on losing weight as well.   [facility] [phone] D. Shave biopsy left upper arm Malignant melanoma, breslow thickness 0.8 mm. Ulceration absent Mitotic index 1/mm2 Peripheral margins involved Deep margins: not involved Regression absent Microsatellitosis absent LVI absent Perineural invasion absent pT1bNx    Review of Systems: A complete review of systems was obtained from the patient.  I have reviewed this information and discussed as appropriate with the patient.  See HPI as well for other ROS.   Review of Systems  Cardiovascular:  Positive for leg swelling.  Psychiatric/Behavioral:  Positive for depression.   All  other systems reviewed and are negative.    Medical History: Past Medical History      Past Medical History:  Diagnosis Date   Arthritis     History of cancer             Patient Active Problem List  Diagnosis   Malignant melanoma of skin of arm, left (CMS-HCC)      Past Surgical History       Past Surgical History:  Procedure Laterality Date   CHOLECYSTECTOMY       REPLACEMENT TOTAL KNEE            Allergies  No Known Allergies           Current Outpatient Medications on File Prior to Visit  Medication Sig Dispense Refill   amLODIPine (NORVASC) 5 MG tablet Take 5 mg by mouth once daily       buPROPion (WELLBUTRIN XL) 150 MG XL tablet Take 1 tablet by mouth once daily       gabapentin (NEURONTIN) 300 MG capsule Take 1 capsule by mouth 3 (three) times daily       losartan (COZAAR) 100 MG tablet losartan 100 mg tablet       omeprazole (PRILOSEC) 40 MG DR capsule TAKE 1 CAPSULE BY MOUTH ONCE A DAY PRIOR TO BREAKFAST 90 90 DAYS       pravastatin (PRAVACHOL) 80 MG tablet pravastatin 80 mg tablet       DULoxetine (CYMBALTA) 60 MG DR capsule Take 120  mg by mouth once daily       OZEMPIC 0.25 mg or 0.5 mg (2 mg/3 mL) pen injector INJECT 0.25 MG INTO THE SKIN ONCE A WEEK FOR 14 DAYS, THEN 0.5 MG ONCE A WEEK FOR 14 DAYS.        No current facility-administered medications on file prior to visit.      Family History       Family History  Problem Relation Age of Onset   Skin cancer Mother     High blood pressure (Hypertension) Mother     Diabetes Mother          Social History        Tobacco Use  Smoking Status Former   Types: Cigarettes   Quit date: 1996   Years since quitting: 27.4  Smokeless Tobacco Never      Social History  Social History         Socioeconomic History   Marital status: Married  Tobacco Use   Smoking status: Former      Types: Cigarettes      Quit date: 1996      Years since quitting: 27.4   Smokeless tobacco: Never  Substance  and Sexual Activity   Alcohol use: Never   Drug use: Never        Objective:       Vitals:    10/08/21 1342  BP: (!) 158/82  Pulse: 93  Temp: 36.8 C (98.3 F)  SpO2: 96%  Weight: (!) 121.1 kg (267 lb)  Height: 165.1 cm ('5\' 5"'$ )    Body mass index is 44.43 kg/m.     Head:   Normocephalic and atraumatic.  Mouth/Throat: Oropharynx is clear and moist. No oropharyngeal exudate.  Eyes:   Conjunctivae are normal. Pupils are equal, round, and reactive to light. No scleral icterus.  Neck:   Normal range of motion. Neck supple. No tracheal deviation present. No thyromegaly present.  Cardiovascular: Normal rate, regular rhythm, normal heart sounds and intact distal pulses.  Exam reveals no gallop and no friction rub.  No murmur heard.  +2 pitting edema BLE, compression stockings on.   Respiratory: Effort normal and breath sounds normal. No respiratory distress. No wheezes, rales or rhonchi.  No chest wall tenderness.  GI:       Soft. Bowel sounds are normal. Abdomen is soft, non tender, non distended.  No masses or hepatosplenomegaly is present. There is no rebound and no guarding.  Musculoskeletal: . Extremities are non tender and without deformity.  Lymphadenopathy:   No cervical or axillary adenopathy.  Neurological: Alert and oriented to person, place, and time. Coordination normal.  Skin:    Scab on left deltoid.  Skin is warm and dry. No rash noted. No diaphoresis. No erythema. No pallor.  Psychiatric: Normal mood and affect.Behavior is normal. Judgment and thought content normal.      Labs, Imaging and Diagnostic Testing: N/a   Assessment and Plan:     Diagnoses and all orders for this visit:   Malignant melanoma of skin of arm, left (CMS-HCC)   Pt has a new diagnosis of a stage I melanoma of the left upper arm.  I recommend a wide local excision with advancement flap closure.  I would consider a sentinel node given that the peripheral margins are involved, but the deep  margin is negative and it has other favorable features and no poor prognostic features.   Additionally, she is at extremely high risk of developing  lymphedema of the left upper extremity given her weight, the history of bilateral lower extremity lymphedema, and the fact that she requires the use of her arm and shoulder with her walker.  It is unlikely that this would be upstaged significantly after wide local excision.   I discussed the risk of surgery including bleeding, infection, wound breakdown, permanent numbness, dissatisfaction with scar, sensation of tightness, heart or lung issues, blood clot, recurrent melanoma, and others.   Patient understands and wishes to proceed.     No follow-ups on file. Georgianne Fick, MD

## 2021-10-24 NOTE — Op Note (Addendum)
PRE-OPERATIVE DIAGNOSIS: cT1aN0 left shoulder melanoma  POST-OPERATIVE DIAGNOSIS:  Same  PROCEDURE:  Procedure(s): Wide local excision 1 cm margins, advancement flap closure for defect 3 cm x 7 cm,   SURGEON:  Surgeon(s): Stark Klein, MD  ASSIST:  Ewell Poe, RNFA  ANESTHESIA:   local and general  DRAINS: none   LOCAL MEDICATIONS USED:  MARCAINE    and XYLOCAINE   SPECIMEN:  Source of Specimen:  wide local excision left shoulder melanoma   FINDINGS:  no gross residual disease.   DISPOSITION OF SPECIMEN:  PATHOLOGY  COUNTS:  YES  PLAN OF CARE: Discharge to home after PACU  PATIENT DISPOSITION:  PACU - hemodynamically stable.    PROCEDURE:   Pt was identified in the holding area, taken to the OR, and placed supine on the OR table.  General anesthesia was induced.  The patient was placed into the supine position with the left arm by the side.  The left shoulder was prepped and draped in sterile fashion.  Time out was performed according to the surgical safety checklist.  When all was correct, we continued.     The melanoma was identified and 1 cm margins were marked out.  This was converted into an ellipse. Local was administered under the melanoma and the adjacent tissue.  A #10 blade was used to incise the skin around the melanoma.  The cautery was used to take the dissection down to the fascia.  The skin was marked in situ with orientation sutures.  The cautery was used to take the specimen off the fascia, and it was passed off the table.    Skin hooks were used to elevate the edges of the incision and the skin was freed up in all directions.  This was pulled together in a longitudinal orientation. The skin was pulled together to check the tension. Hemostasis was achieved with cautery.  The wound was irrigated.  1000 mg of Myriad Morcells were placed to facilitate healing. Deep interrupted 2-0 vicryl sutures were placed to relieve tension.  The skin was then reapproximated  with 3-0 interrupted vicryl deep dermal sutures and 4-0 monocryl running subcuticular sutures.  Three 2-0 nylon horizontal mattress tension relieving sutures were placed as well.    The melanoma site was cleaned, dried, and dressed with Benzoin, steristrips, gauze, and tegaderm.    Needle, sponge, and instrument counts were correct.  The patient was awakened from anesthesia and taken to the PACU in stable condition.

## 2021-10-24 NOTE — Discharge Instructions (Addendum)
[facility] Office Phone Number [phone]   POST OP INSTRUCTIONS  Always review your discharge instruction sheet given to you by the facility where your surgery was performed.  IF YOU HAVE DISABILITY OR FAMILY LEAVE FORMS, YOU MUST BRING THEM TO THE OFFICE FOR PROCESSING.  DO NOT GIVE THEM TO YOUR DOCTOR.  Take 2 tylenol (acetominophen) three times a day for 3 days.  If you still have pain, add ibuprofen with food in between if able to take this (if you have kidney issues or stomach issues, do not take ibuprofen).  If both of those are not enough, add the narcotic pain pill.  If you find you are needing a lot of this overnight after surgery, call the next morning for a refill.   Take your usually prescribed medications unless otherwise directed If you need a refill on your pain medication, please contact your pharmacy.  They will contact our office to request authorization.  Prescriptions will not be filled after 5pm or on week-ends. You should eat very light the first 24 hours after surgery, such as soup, crackers, pudding, etc.  Resume your normal diet the day after surgery It is common to experience some constipation if taking pain medication after surgery.  Increasing fluid intake and taking a stool softener will usually help or prevent this problem from occurring.  A mild laxative (Milk of Magnesia or Miralax) should be taken according to package directions if there are no bowel movements after 48 hours. You may shower in 48 hours.  Remove the outer dressing after the first shower.  Recover with gauze if desired.  .   ACTIVITIES:  No strenuous activity or heavy lifting for 2 weeks. You may drive when you no longer are taking prescription pain medication, you can comfortably wear a seatbelt, and you can safely maneuver your car and apply brakes. RETURN TO WORK:  __________to be determined.  _______________ Dennis Bast should see your doctor in the office for a follow-up appointment  approximately three-four weeks after your surgery.    WHEN TO CALL YOUR DOCTOR: Fever over 101.0 Nausea and/or vomiting. Extreme swelling or bruising. Continued bleeding from incision. Increased pain, redness, or drainage from the incision.  The clinic staff is available to answer your questions during regular business hours.  Please don't hesitate to call and ask to speak to one of the nurses for clinical concerns.  If you have a medical emergency, go to the nearest emergency room or call 911.  A surgeon from [facility] is always on call at the hospital.  For further questions, please visit [email or web address]

## 2021-10-24 NOTE — Transfer of Care (Signed)
Immediate Anesthesia Transfer of Care Note  Patient: Tiffany Brown  Procedure(s) Performed: WIDE LOCAL EXCISION ADVANCEMENT FLAP CLOSURE LEFT UYPPER ARM MELANOMA (Left: Shoulder)  Patient Location: PACU  Anesthesia Type:General  Level of Consciousness: awake and oriented  Airway & Oxygen Therapy: Patient Spontanous Breathing and Patient connected to nasal cannula oxygen  Post-op Assessment: Report given to RN  Post vital signs: Reviewed and stable  Last Vitals:  Vitals Value Taken Time  BP 133/53 10/24/21 1705  Temp    Pulse 90 10/24/21 1707  Resp 16 10/24/21 1707  SpO2 88 % 10/24/21 1707  Vitals shown include unvalidated device data.  Last Pain:  Vitals:   10/24/21 1319  TempSrc:   PainSc: 0-No pain      Patients Stated Pain Goal: 0 (68/11/57 2620)  Complications: No notable events documented.

## 2021-10-24 NOTE — Anesthesia Procedure Notes (Signed)
Procedure Name: LMA Insertion Date/Time: 10/24/2021 3:50 PM  Performed by: Barrington Ellison, CRNAPre-anesthesia Checklist: Patient identified, Emergency Drugs available, Suction available and Patient being monitored Patient Re-evaluated:Patient Re-evaluated prior to induction Oxygen Delivery Method: Circle System Utilized Preoxygenation: Pre-oxygenation with 100% oxygen Induction Type: IV induction Ventilation: Mask ventilation without difficulty LMA: LMA inserted LMA Size: 4.0 Number of attempts: 1 Placement Confirmation: positive ETCO2 Tube secured with: Tape Dental Injury: Teeth and Oropharynx as per pre-operative assessment

## 2021-10-24 NOTE — Interval H&P Note (Signed)
History and Physical Interval Note:  10/24/2021 1:48 PM  Lareta C Handler  has presented today for surgery, with the diagnosis of upper left arm melanoma.  The various methods of treatment have been discussed with the patient and family. After consideration of risks, benefits and other options for treatment, the patient has consented to  Procedure(s): Ricardo (Left) as a surgical intervention.  The patient's history has been reviewed, patient examined, no change in status, stable for surgery.  I have reviewed the patient's chart and labs.  Questions were answered to the patient's satisfaction.     Stark Klein

## 2021-10-25 ENCOUNTER — Encounter (HOSPITAL_COMMUNITY): Payer: Self-pay | Admitting: General Surgery

## 2021-10-25 NOTE — Anesthesia Postprocedure Evaluation (Signed)
Anesthesia Post Note  Patient: Tiffany Brown  Procedure(s) Performed: WIDE LOCAL EXCISION ADVANCEMENT FLAP CLOSURE LEFT UYPPER ARM MELANOMA (Left: Shoulder)     Patient location during evaluation: PACU Anesthesia Type: General Level of consciousness: awake and alert Pain management: pain level controlled Vital Signs Assessment: post-procedure vital signs reviewed and stable Respiratory status: spontaneous breathing, nonlabored ventilation, respiratory function stable and patient connected to nasal cannula oxygen Cardiovascular status: blood pressure returned to baseline and stable Postop Assessment: no apparent nausea or vomiting Anesthetic complications: no   No notable events documented.  Last Vitals:  Vitals:   10/24/21 1720 10/24/21 1735  BP: (!) 143/67 (!) 121/53  Pulse: 84 83  Resp: 19 19  Temp:  36.8 C  SpO2: 96% 95%    Last Pain:  Vitals:   10/24/21 1735  TempSrc:   PainSc: 0-No pain                 Tiffany Brown

## 2021-10-29 ENCOUNTER — Other Ambulatory Visit: Payer: Self-pay | Admitting: Podiatry

## 2021-10-29 ENCOUNTER — Ambulatory Visit: Payer: Medicare Other | Admitting: Family Medicine

## 2021-10-29 DIAGNOSIS — S86111A Strain of other muscle(s) and tendon(s) of posterior muscle group at lower leg level, right leg, initial encounter: Secondary | ICD-10-CM | POA: Diagnosis not present

## 2021-10-29 DIAGNOSIS — T148XXA Other injury of unspecified body region, initial encounter: Secondary | ICD-10-CM | POA: Diagnosis not present

## 2021-10-29 DIAGNOSIS — M7671 Peroneal tendinitis, right leg: Secondary | ICD-10-CM | POA: Diagnosis not present

## 2021-10-29 DIAGNOSIS — M76821 Posterior tibial tendinitis, right leg: Secondary | ICD-10-CM | POA: Diagnosis not present

## 2021-10-29 MED ORDER — IBUPROFEN 800 MG PO TABS: 800.0000 mg | ORAL_TABLET | Freq: Four times a day (QID) | ORAL | 1 refills | Status: DC | PRN

## 2021-10-29 MED ORDER — OXYCODONE-ACETAMINOPHEN 5-325 MG PO TABS: 1.0000 | ORAL_TABLET | ORAL | 0 refills | Status: DC | PRN

## 2021-11-01 ENCOUNTER — Other Ambulatory Visit: Payer: Self-pay | Admitting: Physician Assistant

## 2021-11-04 LAB — SURGICAL PATHOLOGY

## 2021-11-07 ENCOUNTER — Ambulatory Visit (INDEPENDENT_AMBULATORY_CARE_PROVIDER_SITE_OTHER): Payer: Medicare Other

## 2021-11-07 ENCOUNTER — Ambulatory Visit (INDEPENDENT_AMBULATORY_CARE_PROVIDER_SITE_OTHER): Payer: Medicare Other | Admitting: Podiatry

## 2021-11-07 DIAGNOSIS — D040 Carcinoma in situ of skin of lip: Secondary | ICD-10-CM | POA: Diagnosis not present

## 2021-11-07 DIAGNOSIS — M7671 Peroneal tendinitis, right leg: Secondary | ICD-10-CM

## 2021-11-07 DIAGNOSIS — C44329 Squamous cell carcinoma of skin of other parts of face: Secondary | ICD-10-CM | POA: Diagnosis not present

## 2021-11-07 DIAGNOSIS — T81509A Unspecified complication of foreign body accidentally left in body following unspecified procedure, initial encounter: Secondary | ICD-10-CM

## 2021-11-07 DIAGNOSIS — C44311 Basal cell carcinoma of skin of nose: Secondary | ICD-10-CM | POA: Diagnosis not present

## 2021-11-07 DIAGNOSIS — Z9889 Other specified postprocedural states: Secondary | ICD-10-CM

## 2021-11-07 DIAGNOSIS — M6780 Other specified disorders of synovium and tendon, unspecified site: Secondary | ICD-10-CM

## 2021-11-07 MED ORDER — DOXYCYCLINE HYCLATE 100 MG PO TABS: 100.0000 mg | ORAL_TABLET | Freq: Two times a day (BID) | ORAL | 0 refills | Status: DC

## 2021-11-13 NOTE — Progress Notes (Signed)
Subjective:  Patient ID: Tiffany Brown, female    DOB: Jul 13, 1945,  MRN: [id number]  Chief Complaint  Patient presents with   Routine Post Op    POV #1 DOS 10/29/2021 RT REPAIR OF PERONEALTENDON W/ LENGTHENING OF POSTERIOR TIBAL TENDON    DOS: 10/29/2021 Procedure: Right peroneal tendon repair with with posterior tibial tendon lengthening  76 y.o. female returns for post-op check.  Patient states she is doing okay.  Minimal pain mild erythema.  Bandages clean dry and intact.  Review of Systems: Negative except as noted in the HPI. Denies N/V/F/Ch.  Past Medical History:  Diagnosis Date   Abnormal glucose    Depression with anxiety    Essential hypertension, benign    Fibromyalgia    GERD (gastroesophageal reflux disease)    Lymphedema of both lower extremities    and stomach   Melanoma ([place])    Near syncope    Other and unspecified hyperlipidemia    Pre-diabetes     Current Outpatient Medications:    doxycycline (VIBRA-TABS) 100 MG tablet, Take 1 tablet (100 mg total) by mouth 2 (two) times daily., Disp: 28 tablet, Rfl: 0   amLODipine (NORVASC) 5 MG tablet, Take 1 tablet (5 mg total) by mouth daily., Disp: 90 tablet, Rfl: 3   buPROPion (WELLBUTRIN XL) 150 MG 24 hr tablet, Take 1 tablet (150 mg total) by mouth daily., Disp: 90 tablet, Rfl: 3   DULoxetine (CYMBALTA) 60 MG capsule, Take 2 capsules (120 mg total) by mouth daily. (Patient taking differently: Take 60 mg by mouth 2 (two) times daily.), Disp: 180 capsule, Rfl: 3   fluticasone (FLONASE) 50 MCG/ACT nasal spray, Place 2 sprays into the nose daily as needed for rhinitis., Disp: , Rfl:    furosemide (LASIX) 40 MG tablet, Take 1 tablet (40 mg total) by mouth 2 (two) times daily as needed. (Patient taking differently: Take 40 mg by mouth 2 (two) times daily as needed for fluid or edema.), Disp: 180 tablet, Rfl: 3   gabapentin (NEURONTIN) 300 MG capsule, TAKE 1 CAPSULE BY MOUTH THREE TIMES A DAY, Disp: 90 capsule, Rfl: 3    ibuprofen (ADVIL) 800 MG tablet, Take 1 tablet (800 mg total) by mouth every 6 (six) hours as needed. (Patient taking differently: Take 800 mg by mouth every 6 (six) hours as needed for mild pain or moderate pain.), Disp: 60 tablet, Rfl: 1   ibuprofen (ADVIL) 800 MG tablet, Take 1 tablet (800 mg total) by mouth every 6 (six) hours as needed., Disp: 60 tablet, Rfl: 1   losartan (COZAAR) 100 MG tablet, Take 1 tablet (100 mg total) by mouth daily., Disp: 90 tablet, Rfl: 3   omeprazole (PRILOSEC) 40 MG capsule, Take 40 mg by mouth daily before breakfast., Disp: , Rfl:    oxyCODONE (OXY IR/ROXICODONE) 5 MG immediate release tablet, Take 1 tablet (5 mg total) by mouth every 6 (six) hours as needed for severe pain., Disp: 15 tablet, Rfl: 0   oxyCODONE-acetaminophen (PERCOCET) 5-325 MG tablet, Take 1 tablet by mouth every 4 (four) hours as needed for severe pain., Disp: 30 tablet, Rfl: 0   OZEMPIC, 0.25 OR 0.5 MG/DOSE, 2 MG/3ML SOPN, Inject 0.25 mg into the skin every Friday. 0.25 mg14 days  0.5 mg 14 days, Disp: , Rfl:    pravastatin (PRAVACHOL) 80 MG tablet, Take 1 tablet (80 mg total) by mouth daily., Disp: 90 tablet, Rfl: 3  Social History   Tobacco Use  Smoking Status Former  Packs/day: 0.80   Years: 25.00   Total pack years: 20.00   Types: Cigarettes   Quit date: 05/06/1994   Years since quitting: 27.5  Smokeless Tobacco Never    No Active Allergies Objective:  There were no vitals filed for this visit. There is no height or weight on file to calculate BMI. Constitutional Well developed. Well nourished.  Vascular Foot warm and well perfused. Capillary refill normal to all digits.   Neurologic Normal speech. Oriented to person, place, and time. Epicritic sensation to light touch grossly present bilaterally.  Dermatologic Skin healing well without signs of infection. Skin edges well coapted without signs of infection.  Moderate edema noted  Orthopedic: Tenderness to palpation noted about  the surgical site.   Radiographs: None Assessment:   1. Status post foot surgery   2. Tendon weakness   3. Peroneal tendinitis, right    Plan:  Patient was evaluated and treated and all questions answered.  S/p foot surgery right -Progressing as expected post-operatively. -XR: See above -WB Status: Nonweightbearing right lower extremity in knee scooter -Sutures: Intact.  No clinical signs of Deis is noted.  No complication noted. -Medications: Doxycycline was prescribed for skin and soft tissue prophylaxis -Foot redressed.  No follow-ups on file.

## 2021-11-13 NOTE — Progress Notes (Signed)
Subjective:  Patient ID: Tiffany Brown, female    DOB: [birth year],  MRN: [id number]  Chief Complaint  Patient presents with   Routine Post Op    POV #1 DOS 10/29/2021 RT REPAIR OF PERONEALTENDON W/ LENGTHENING OF POSTERIOR TIBAL TENDON    DOS: 10/29/2021 Procedure: Right peroneal tendon tenodesis with right posterior tibial tendon lengthening  76 y.o. female returns for post-op check.  Patient states she is doing okay.  Minimal pain.  Pain is controlled.  Throbbing aching noted.  There is swelling.  Mild erythema noted.  Bandages clean dry and intact  Review of Systems: Negative except as noted in the HPI. Denies N/V/F/Ch.  Past Medical History:  Diagnosis Date   Abnormal glucose    Depression with anxiety    Essential hypertension, benign    Fibromyalgia    GERD (gastroesophageal reflux disease)    Lymphedema of both lower extremities    and stomach   Melanoma ([place])    Near syncope    Other and unspecified hyperlipidemia    Pre-diabetes     Current Outpatient Medications:    doxycycline (VIBRA-TABS) 100 MG tablet, Take 1 tablet (100 mg total) by mouth 2 (two) times daily., Disp: 28 tablet, Rfl: 0   amLODipine (NORVASC) 5 MG tablet, Take 1 tablet (5 mg total) by mouth daily., Disp: 90 tablet, Rfl: 3   buPROPion (WELLBUTRIN XL) 150 MG 24 hr tablet, Take 1 tablet (150 mg total) by mouth daily., Disp: 90 tablet, Rfl: 3   DULoxetine (CYMBALTA) 60 MG capsule, Take 2 capsules (120 mg total) by mouth daily. (Patient taking differently: Take 60 mg by mouth 2 (two) times daily.), Disp: 180 capsule, Rfl: 3   fluticasone (FLONASE) 50 MCG/ACT nasal spray, Place 2 sprays into the nose daily as needed for rhinitis., Disp: , Rfl:    furosemide (LASIX) 40 MG tablet, Take 1 tablet (40 mg total) by mouth 2 (two) times daily as needed. (Patient taking differently: Take 40 mg by mouth 2 (two) times daily as needed for fluid or edema.), Disp: 180 tablet, Rfl: 3   gabapentin (NEURONTIN) 300 MG  capsule, TAKE 1 CAPSULE BY MOUTH THREE TIMES A DAY, Disp: 90 capsule, Rfl: 3   ibuprofen (ADVIL) 800 MG tablet, Take 1 tablet (800 mg total) by mouth every 6 (six) hours as needed. (Patient taking differently: Take 800 mg by mouth every 6 (six) hours as needed for mild pain or moderate pain.), Disp: 60 tablet, Rfl: 1   ibuprofen (ADVIL) 800 MG tablet, Take 1 tablet (800 mg total) by mouth every 6 (six) hours as needed., Disp: 60 tablet, Rfl: 1   losartan (COZAAR) 100 MG tablet, Take 1 tablet (100 mg total) by mouth daily., Disp: 90 tablet, Rfl: 3   omeprazole (PRILOSEC) 40 MG capsule, Take 40 mg by mouth daily before breakfast., Disp: , Rfl:    oxyCODONE (OXY IR/ROXICODONE) 5 MG immediate release tablet, Take 1 tablet (5 mg total) by mouth every 6 (six) hours as needed for severe pain., Disp: 15 tablet, Rfl: 0   oxyCODONE-acetaminophen (PERCOCET) 5-325 MG tablet, Take 1 tablet by mouth every 4 (four) hours as needed for severe pain., Disp: 30 tablet, Rfl: 0   OZEMPIC, 0.25 OR 0.5 MG/DOSE, 2 MG/3ML SOPN, Inject 0.25 mg into the skin every Friday. 0.25 mg14 days  0.5 mg 14 days, Disp: , Rfl:    pravastatin (PRAVACHOL) 80 MG tablet, Take 1 tablet (80 mg total) by mouth daily., Disp: 90 tablet, Rfl:  3  Social History   Tobacco Use  Smoking Status Former   Packs/day: 0.80   Years: 25.00   Total pack years: 20.00   Types: Cigarettes   Quit date: 05/06/1994   Years since quitting: 27.5  Smokeless Tobacco Never    No Active Allergies Objective:  There were no vitals filed for this visit. There is no height or weight on file to calculate BMI. Constitutional Well developed. Well nourished.  Vascular Foot warm and well perfused. Capillary refill normal to all digits.   Neurologic Normal speech. Oriented to person, place, and time. Epicritic sensation to light touch grossly present bilaterally.  Dermatologic Skin healing well without signs of infection. Skin edges well coapted without signs of  infection.  Mild erythema  Orthopedic: Tenderness to palpation noted about the surgical site.   Radiographs: None Assessment:   1. Status post foot surgery   2. Tendon weakness   3. Peroneal tendinitis, right    Plan:  Patient was evaluated and treated and all questions answered.  S/p foot surgery right -Progressing as expected post-operatively. -XR: None -WB Status: Nonweightbearing in right lower extremity -Sutures: Intact.  No clinical signs of Deis is noted.  No complication noted.  Mild erythema noted. -Medications: Doxycycline -Foot redressed.  No follow-ups on file.

## 2021-11-14 ENCOUNTER — Ambulatory Visit (INDEPENDENT_AMBULATORY_CARE_PROVIDER_SITE_OTHER): Payer: Medicare Other | Admitting: Family Medicine

## 2021-11-14 ENCOUNTER — Encounter: Payer: Self-pay | Admitting: Family Medicine

## 2021-11-14 VITALS — BP 136/72 | HR 73 | Temp 98.3°F | Ht 65.0 in | Wt 271.0 lb

## 2021-11-14 DIAGNOSIS — I10 Essential (primary) hypertension: Secondary | ICD-10-CM

## 2021-11-14 DIAGNOSIS — E782 Mixed hyperlipidemia: Secondary | ICD-10-CM | POA: Diagnosis not present

## 2021-11-14 DIAGNOSIS — E1169 Type 2 diabetes mellitus with other specified complication: Secondary | ICD-10-CM

## 2021-11-14 DIAGNOSIS — E785 Hyperlipidemia, unspecified: Secondary | ICD-10-CM | POA: Diagnosis not present

## 2021-11-14 LAB — BAYER DCA HB A1C WAIVED: HB A1C (BAYER DCA - WAIVED): 5.8 % — ABNORMAL HIGH (ref 4.8–5.6)

## 2021-11-14 NOTE — Progress Notes (Signed)
BP 136/72   Pulse 73   Temp 98.3 F (36.8 C)   Ht _0  (1.651 m)   Wt 271 lb (122.9 kg)   SpO2 99%   BMI 45.10 kg/m    Subjective:   Patient ID: Tiffany Brown, female    DOB: December 25, 1945, 76 y.o.   MRN: [id number]  HPI: [name] is a 75 y.o. female presenting on 11/14/2021 for Medical Management of Chronic Issues, Hyperlipidemia, and Diabetes   HPI Type 2 diabetes mellitus Patient comes in today for recheck of his diabetes. Patient has been currently taking Ozempic, A1c looks good at 5.8. Patient is currently on an ACE inhibitor/ARB. Patient has not seen an ophthalmologist this year. Patient denies any issues with their feet. The symptom started onset as an adult hypertension and hyperlipidemia ARE RELATED TO DM   Hyperlipidemia Patient is coming in for recheck of his hyperlipidemia. The patient is currently taking pravastatin. They deny any issues with myalgias or history of liver damage from it. They deny any focal numbness or weakness or chest pain.   Hypertension Patient is currently on losartan and amlodipine, and their blood pressure today is 136/72. Patient denies any lightheadedness or dizziness. Patient denies headaches, blurred vision, chest pains, shortness of breath, or weakness. Denies any side effects from medication and is content with current medication.   Patient recently had a melanoma surgery of the left shoulder for which she is going to follow-up with dermatology.  This was just done a few weeks ago.  She also recently had a surgery on her right ankle for torn tendons and it was repaired and she has follow-up with her surgeon for that.  Relevant past medical, surgical, family and social history reviewed and updated as indicated. Interim medical history since our last visit reviewed. Allergies and medications reviewed and updated.  Review of Systems  Constitutional:  Negative for chills and fever.  HENT:  Negative for congestion, ear discharge and  ear pain.   Eyes:  Negative for visual disturbance.  Respiratory:  Negative for chest tightness and shortness of breath.   Cardiovascular:  Negative for chest pain and leg swelling.  Genitourinary:  Negative for difficulty urinating and dysuria.  Musculoskeletal:  Positive for arthralgias. Negative for back pain and gait problem.  Skin:  Positive for wound. Negative for color change and rash.  Neurological:  Negative for light-headedness and headaches.  Psychiatric/Behavioral:  Negative for agitation and behavioral problems.   All other systems reviewed and are negative.   Per HPI unless specifically indicated above   Allergies as of 11/14/2021   No Active Allergies      Medication List        Accurate as of November 14, 2021  1:50 PM. If you have any questions, ask your nurse or doctor.          STOP taking these medications    oxyCODONE 5 MG immediate release tablet Commonly known as: Oxy IR/ROXICODONE Stopped by: Worthy Rancher, MD   oxyCODONE-acetaminophen 5-325 MG tablet Commonly known as: Percocet Stopped by: Fransisca Kaufmann Karanvir Balderston, MD       TAKE these medications    amLODipine 5 MG tablet Commonly known as: NORVASC Take 1 tablet (5 mg total) by mouth daily.   buPROPion 150 MG 24 hr tablet Commonly known as: Wellbutrin XL Take 1 tablet (150 mg total) by mouth daily.   doxycycline 100 MG tablet Commonly known as: VIBRA-TABS Take 1 tablet (100 mg total)  by mouth 2 (two) times daily.   DULoxetine 60 MG capsule Commonly known as: CYMBALTA Take 2 capsules (120 mg total) by mouth daily. What changed:  how much to take when to take this   fluticasone 50 MCG/ACT nasal spray Commonly known as: FLONASE Place 2 sprays into the nose daily as needed for rhinitis.   furosemide 40 MG tablet Commonly known as: LASIX Take 1 tablet (40 mg total) by mouth 2 (two) times daily as needed. What changed: reasons to take this   gabapentin 300 MG capsule Commonly  known as: NEURONTIN TAKE 1 CAPSULE BY MOUTH THREE TIMES A DAY   ibuprofen 800 MG tablet Commonly known as: ADVIL Take 1 tablet (800 mg total) by mouth every 6 (six) hours as needed. What changed: reasons to take this   ibuprofen 800 MG tablet Commonly known as: ADVIL Take 1 tablet (800 mg total) by mouth every 6 (six) hours as needed. What changed: Another medication with the same name was changed. Make sure you understand how and when to take each.   losartan 100 MG tablet Commonly known as: COZAAR Take 1 tablet (100 mg total) by mouth daily.   omeprazole 40 MG capsule Commonly known as: PRILOSEC Take 40 mg by mouth daily before breakfast.   Ozempic (0.25 or 0.5 MG/DOSE) 2 MG/3ML Sopn Generic drug: Semaglutide(0.25 or 0.5MG/DOS) Inject 0.25 mg into the skin every Friday. 0.25 mg14 days  0.5 mg 14 days   pravastatin 80 MG tablet Commonly known as: PRAVACHOL Take 1 tablet (80 mg total) by mouth daily.         Objective:   BP 136/72   Pulse 73   Temp 98.3 F (36.8 C)   Ht _0  (1.651 m)   Wt 271 lb (122.9 kg)   SpO2 99%   BMI 45.10 kg/m   Wt Readings from Last 3 Encounters:  11/14/21 271 lb (122.9 kg)  10/24/21 267 lb (121.1 kg)  09/24/21 268 lb (121.6 kg)    Physical Exam Vitals and nursing note reviewed.  Constitutional:      General: She is not in acute distress.    Appearance: She is well-developed. She is not diaphoretic.  Eyes:     Conjunctiva/sclera: Conjunctivae normal.  Cardiovascular:     Rate and Rhythm: Normal rate and regular rhythm.     Heart sounds: Normal heart sounds. No murmur heard. Pulmonary:     Effort: Pulmonary effort is normal. No respiratory distress.     Breath sounds: Normal breath sounds. No wheezing.  Musculoskeletal:        General: Swelling (Trace bilateral lower extremity) present. Normal range of motion.  Skin:    General: Skin is warm and dry.     Findings: No rash.     Comments: Healing incision on left anterior  shoulder, no erythema or warmth.  Appears to be healing nicely  Neurological:     Mental Status: She is alert and oriented to person, place, and time.     Coordination: Coordination normal.  Psychiatric:        Behavior: Behavior normal.       Assessment & Plan:   Problem List Items Addressed This Visit       Cardiovascular and Mediastinum   Essential hypertension, benign - Primary   Relevant Orders   Bayer DCA Hb A1c Waived   Lipid panel   CBC with Differential/Platelet   BMP8+EGFR     Endocrine   DM type 2 with  diabetic dyslipidemia ([place])   Relevant Orders   Bayer DCA Hb A1c Waived   Lipid panel   CBC with Differential/Platelet   BMP8+EGFR     Other   Hyperlipidemia   Relevant Orders   Bayer DCA Hb A1c Waived   Lipid panel   CBC with Differential/Platelet   BMP8+EGFR  A1c looks good at 5.8.  Blood pressure looks good and heart rate looks good.  She does have to do some physical therapy for her ankle but other than that things are going well.  Follow up plan: Return in about 3 months (around 02/14/2022), or if symptoms worsen or fail to improve, for Hypertension diabetes and hyperlipidemia recheck.  Counseling provided for all of the vaccine components Orders Placed This Encounter  Procedures   Bayer [place] Hb A1c Waived   Lipid panel   CBC with Differential/Platelet   BMP8+EGFR    Caryl Pina, MD [facility] 11/14/2021, 1:50 PM

## 2021-11-15 LAB — BMP8+EGFR
BUN/Creatinine Ratio: 30 — ABNORMAL HIGH (ref 12–28)
BUN: 19 mg/dL (ref 8–27)
CO2: 25 mmol/L (ref 20–29)
Calcium: 9 mg/dL (ref 8.7–10.3)
Chloride: 103 mmol/L (ref 96–106)
Creatinine, Ser: 0.64 mg/dL (ref 0.57–1.00)
Glucose: 135 mg/dL — ABNORMAL HIGH (ref 70–99)
Potassium: 4.2 mmol/L (ref 3.5–5.2)
Sodium: 144 mmol/L (ref 134–144)
eGFR: 91 mL/min/{1.73_m2} (ref >59)

## 2021-11-15 LAB — LIPID PANEL
Chol/HDL Ratio: 3 ratio (ref 0.0–4.4)
Cholesterol, Total: 152 mg/dL (ref 100–199)
HDL: 51 mg/dL (ref >39)
LDL Chol Calc (NIH): 68 mg/dL (ref 0–99)
Triglycerides: 203 mg/dL — ABNORMAL HIGH (ref 0–149)
VLDL Cholesterol Cal: 33 mg/dL (ref 5–40)

## 2021-11-15 LAB — CBC WITH DIFFERENTIAL/PLATELET
Basophils Absolute: 0 10*3/uL (ref 0.0–0.2)
Basos: 0 %
EOS (ABSOLUTE): 0.2 10*3/uL (ref 0.0–0.4)
Eos: 2 %
Hematocrit: 37.5 % (ref 34.0–46.6)
Hemoglobin: 12.5 g/dL (ref 11.1–15.9)
Immature Grans (Abs): 0.1 10*3/uL (ref 0.0–0.1)
Immature Granulocytes: 1 %
Lymphocytes Absolute: 2.8 10*3/uL (ref 0.7–3.1)
Lymphs: 28 %
MCH: 30.8 pg (ref 26.6–33.0)
MCHC: 33.3 g/dL (ref 31.5–35.7)
MCV: 92 fL (ref 79–97)
Monocytes Absolute: 0.6 10*3/uL (ref 0.1–0.9)
Monocytes: 6 %
Neutrophils Absolute: 6.1 10*3/uL (ref 1.4–7.0)
Neutrophils: 63 %
Platelets: 219 10*3/uL (ref 150–450)
RBC: 4.06 x10E6/uL (ref 3.77–5.28)
RDW: 13 % (ref 11.7–15.4)
WBC: 9.8 10*3/uL (ref 3.4–10.8)

## 2021-11-21 ENCOUNTER — Encounter: Payer: Medicare Other | Admitting: Podiatry

## 2021-11-21 DIAGNOSIS — C44321 Squamous cell carcinoma of skin of nose: Secondary | ICD-10-CM | POA: Diagnosis not present

## 2021-11-28 ENCOUNTER — Encounter: Payer: Self-pay | Admitting: Podiatry

## 2021-11-28 ENCOUNTER — Ambulatory Visit (INDEPENDENT_AMBULATORY_CARE_PROVIDER_SITE_OTHER): Payer: Medicare Other | Admitting: Podiatry

## 2021-11-28 DIAGNOSIS — Z9889 Other specified postprocedural states: Secondary | ICD-10-CM

## 2021-11-28 DIAGNOSIS — M7671 Peroneal tendinitis, right leg: Secondary | ICD-10-CM | POA: Diagnosis not present

## 2021-11-28 DIAGNOSIS — M6780 Other specified disorders of synovium and tendon, unspecified site: Secondary | ICD-10-CM

## 2021-11-28 NOTE — Progress Notes (Signed)
Subjective:  Patient ID: Tiffany Brown, female    DOB: 1945-05-19,  MRN: [id number]  Chief Complaint  Patient presents with   Routine Post Op    POV #2 DOS 10/29/2021 RT REPAIR OF PERONEALTENDON W/ LENGTHENING OF POSTERIOR TIBAL TENDON    DOS: 10/29/2021 Procedure: Right peroneal tendon repair with with posterior tibial tendon lengthening  76 y.o. female returns for post-op check.  Patient states she is doing okay.  Minimal pain mild erythema.  Bandages clean dry and intact.  Review of Systems: Negative except as noted in the HPI. Denies N/V/F/Ch.  Past Medical History:  Diagnosis Date   Abnormal glucose    Depression with anxiety    Essential hypertension, benign    Fibromyalgia    GERD (gastroesophageal reflux disease)    Lymphedema of both lower extremities    and stomach   Melanoma ([place])    Near syncope    Other and unspecified hyperlipidemia    Pre-diabetes     Current Outpatient Medications:    amLODipine (NORVASC) 5 MG tablet, Take 1 tablet (5 mg total) by mouth daily., Disp: 90 tablet, Rfl: 3   buPROPion (WELLBUTRIN XL) 150 MG 24 hr tablet, Take 1 tablet (150 mg total) by mouth daily., Disp: 90 tablet, Rfl: 3   doxycycline (VIBRA-TABS) 100 MG tablet, Take 1 tablet (100 mg total) by mouth 2 (two) times daily., Disp: 28 tablet, Rfl: 0   DULoxetine (CYMBALTA) 60 MG capsule, Take 2 capsules (120 mg total) by mouth daily. (Patient taking differently: Take 60 mg by mouth 2 (two) times daily.), Disp: 180 capsule, Rfl: 3   fluticasone (FLONASE) 50 MCG/ACT nasal spray, Place 2 sprays into the nose daily as needed for rhinitis., Disp: , Rfl:    furosemide (LASIX) 40 MG tablet, Take 1 tablet (40 mg total) by mouth 2 (two) times daily as needed. (Patient taking differently: Take 40 mg by mouth 2 (two) times daily as needed for fluid or edema.), Disp: 180 tablet, Rfl: 3   gabapentin (NEURONTIN) 300 MG capsule, TAKE 1 CAPSULE BY MOUTH THREE TIMES A DAY, Disp: 90 capsule, Rfl: 3    ibuprofen (ADVIL) 800 MG tablet, Take 1 tablet (800 mg total) by mouth every 6 (six) hours as needed. (Patient taking differently: Take 800 mg by mouth every 6 (six) hours as needed for mild pain or moderate pain.), Disp: 60 tablet, Rfl: 1   ibuprofen (ADVIL) 800 MG tablet, Take 1 tablet (800 mg total) by mouth every 6 (six) hours as needed., Disp: 60 tablet, Rfl: 1   losartan (COZAAR) 100 MG tablet, Take 1 tablet (100 mg total) by mouth daily., Disp: 90 tablet, Rfl: 3   omeprazole (PRILOSEC) 40 MG capsule, Take 40 mg by mouth daily before breakfast., Disp: , Rfl:    OZEMPIC, 0.25 OR 0.5 MG/DOSE, 2 MG/3ML SOPN, Inject 0.25 mg into the skin every Friday. 0.25 mg14 days  0.5 mg 14 days, Disp: , Rfl:    pravastatin (PRAVACHOL) 80 MG tablet, Take 1 tablet (80 mg total) by mouth daily., Disp: 90 tablet, Rfl: 3  Social History   Tobacco Use  Smoking Status Former   Packs/day: 0.80   Years: 25.00   Total pack years: 20.00   Types: Cigarettes   Quit date: 05/06/1994   Years since quitting: 27.5  Smokeless Tobacco Never    No Active Allergies Objective:  There were no vitals filed for this visit. There is no height or weight on file to calculate BMI.  Constitutional Well developed. Well nourished.  Vascular Foot warm and well perfused. Capillary refill normal to all digits.   Neurologic Normal speech. Oriented to person, place, and time. Epicritic sensation to light touch grossly present bilaterally.  Dermatologic Skin completely epithelialized.  No signs of Deis is noted no complication noted.  Edema noted.  Orthopedic: Mild tenderness to palpation noted about the surgical site.   Radiographs: None Assessment:   1. Status post foot surgery   2. Peroneal tendinitis, right   3. Tendon weakness    Plan:  Patient was evaluated and treated and all questions answered.  S/p foot surgery right -Progressing as expected post-operatively. -XR: See above -WB Status: Begin slowly transition to  weightbearing as tolerated with a Tri-Lock ankle brace to the right side -Sutures: Removed no clinical signs of Deis is noted.  No complication noted. -Medications: None -I have also placed the order for physical therapy as well. -Tri-Lock ankle brace was dispensed for transition.  No follow-ups on file.

## 2021-12-03 DIAGNOSIS — D040 Carcinoma in situ of skin of lip: Secondary | ICD-10-CM | POA: Diagnosis not present

## 2021-12-06 ENCOUNTER — Ambulatory Visit: Payer: Medicare Other

## 2021-12-11 ENCOUNTER — Ambulatory Visit: Payer: Medicare Other | Attending: Podiatry | Admitting: Physical Therapy

## 2021-12-11 ENCOUNTER — Other Ambulatory Visit: Payer: Self-pay

## 2021-12-11 ENCOUNTER — Encounter: Payer: Self-pay | Admitting: Physical Therapy

## 2021-12-11 DIAGNOSIS — Z9889 Other specified postprocedural states: Secondary | ICD-10-CM | POA: Diagnosis not present

## 2021-12-11 DIAGNOSIS — R600 Localized edema: Secondary | ICD-10-CM | POA: Insufficient documentation

## 2021-12-11 DIAGNOSIS — M25571 Pain in right ankle and joints of right foot: Secondary | ICD-10-CM | POA: Insufficient documentation

## 2021-12-11 DIAGNOSIS — M25671 Stiffness of right ankle, not elsewhere classified: Secondary | ICD-10-CM | POA: Insufficient documentation

## 2021-12-11 NOTE — Therapy (Signed)
OUTPATIENT PHYSICAL THERAPY LOWER EXTREMITY EVALUATION   Patient Name: Tiffany Brown MRN: [id number] DOB:02/16/1946, 76 y.o., female Today's Date: 12/11/2021   PT End of Session - 12/11/21 1145     Visit Number 1    Number of Visits 8    Date for PT Re-Evaluation 01/08/22    PT Start Time 1116    PT Stop Time 9833    PT Time Calculation (min) 40 min    Activity Tolerance Patient tolerated treatment well    Behavior During Therapy WFL for tasks assessed/performed             Past Medical History:  Diagnosis Date   Abnormal glucose    Depression with anxiety    Essential hypertension, benign    Fibromyalgia    GERD (gastroesophageal reflux disease)    Lymphedema of both lower extremities    and stomach   Melanoma ([place])    Near syncope    Other and unspecified hyperlipidemia    Pre-diabetes    Past Surgical History:  Procedure Laterality Date   ABDOMINAL HYSTERECTOMY     BREAST SURGERY     LEFT   CHOLECYSTECTOMY     ELBOW SURGERY     X's 3 Left   IR ABLATE LIVER CRYOABLATION  02/15/2021   IR RADIOLOGIST EVAL & MGMT  02/08/2021   IR RADIOLOGIST EVAL & MGMT  03/14/2021   MELANOMA EXCISION Left 10/24/2021   Procedure: WIDE LOCAL EXCISION ADVANCEMENT FLAP CLOSURE LEFT UYPPER ARM MELANOMA;  Surgeon: Stark Klein, MD;  Location: [facility];  Service: General;  Laterality: Left;   RIGHT DISTAL RADIUS FRACTURE     TOTAL KNEE ARTHROPLASTY     RIGHT   Patient Active Problem List   Diagnosis Date Noted   Stasis dermatitis of both legs 12/24/2018   Degenerative disc disease, lumbar 12/24/2018   Fibromyalgia 12/24/2018   Vitamin D deficiency 12/24/2018   Class 3 severe obesity with serious comorbidity and body mass index (BMI) of 40.0 to 44.9 in adult ([place]) 07/23/2017   Closed fracture of right inferior pubic ramus (HCC) 11/18/2016   Gastroesophageal reflux disease 06/06/2016   Recurrent major depressive disorder, in full remission ([place]) 06/06/2016   DM type 2 with  diabetic dyslipidemia ([place]) 03/06/2016   Edema 09/14/2013   Precordial pain 08/20/2011   Essential hypertension, benign    Hyperlipidemia     REFERRING PROVIDER: Boneta Lucks DPM  REFERRING DIAG: S/p right foot surgery.  THERAPY DIAG:  Pain in right ankle and joints of right foot - Plan: PT plan of care cert/re-cert  Localized edema - Plan: PT plan of care cert/re-cert  Stiffness of right ankle, not elsewhere classified - Plan: PT plan of care cert/re-cert  Rationale for Evaluation and Treatment Rehabilitation  ONSET DATE: Recent surgery (10/29/21).  SUBJECTIVE:   SUBJECTIVE STATEMENT: The patient presents to the clinic today s/p repair of right peroneal tendon with lengthening of posterior tibialis tendon.  She is in a CAM boot weight as tolerated using a Rollator.  She has a Tri-Lock ankle brace but states at this time it has been too uncomfortable to wear.  She is reporting a pain-level of 8/10 and cannot be up long due to pain.      PERTINENT HISTORY: HTN, Fibromyalgia, left elbow surgery, right TKA, h/o left knee pain, h/o bilateral shoulder pain, removal of melanoma from left arm and face, statis dermatitis.  PAIN:  Are you having pain? Yes: NPRS scale: 8/10 Pain location: Left  ankle. Pain description: Throbbing, sharp, shooting. Aggravating factors: Standing. Relieving factors: Staying off feet.  PRECAUTIONS: Other: Currently in right CAM boot.  WEIGHT BEARING RESTRICTIONS  WBAT over right LE with CAM boot donned and use of Rollator.  FALLS:  Has patient fallen in last 6 months? Yes. Number of falls 1.  LIVING ENVIRONMENT: Lives in: House/apartment Has following equipment at home: Gilford Rile - 4 wheeled  OCCUPATION: Retired.  PLOF: Independent with basic ADLs  PATIENT GOALS Would like to be able to walk better and longer.   OBJECTIVE:   EDEMA:  Circumferential: Bi-malleolar:  Right 2 cms >  left. PALPATION: C/o medial and lateral right ankle tenderness.   Incisions appear to be well healed.  LOWER EXTREMITY ROM:  The patient's right ankle resting position is remarkable for 25 degrees of inversion.  She only has 10 degrees of active motion into the DF/PF plane of movement and 5 degrees into Inv/ever plane of movement.  GAIT: So and cautious gait with right CAM boot and use of Rollator.  TODAY'S TREATMENT: Vasopneumatic on low x 15 minutes.  ASSESSMENT:  CLINICAL IMPRESSION: The patient presents to OPPT s/p right ankle surgery performed on 10/29/21.  She had a previous surgery on 12/18/21.  She is wbat over her right LE with a CAM boot donned and use of a Rollator.  She has some edema.  Her right ankle active range of motion is very limited.  She cannot walk or stand for prolonged periods of time due to pain.  She hopes to regain range of motion and be able to walk better and longer.  She has an ankle brace to transition into but states it is too uncomfortable to wear at this time.   OBJECTIVE IMPAIRMENTS Abnormal gait, decreased activity tolerance, difficulty walking, decreased ROM, increased edema, and pain.   ACTIVITY LIMITATIONS standing and locomotion level  PARTICIPATION LIMITATIONS: meal prep, cleaning, and laundry  PERSONAL FACTORS Time since onset of injury/illness/exacerbation and 1 comorbidity: Prior right ankle surgery  are also affecting patient's functional outcome.   REHAB POTENTIAL: Fair    CLINICAL DECISION MAKING: Stable/uncomplicated  EVALUATION COMPLEXITY: Low   GOALS:  LONG TERM GOALS: Target date: 01/08/2022   Ind with HEP. Baseline:  Goal status: INITIAL  2.  Achieve right ankle neutral position. Baseline:  Goal status: INITIAL  3.  Walk 250 feet with right ankle braced and using Rollator with pain not > 4/10. Baseline:  Goal status: INITIAL     PLAN: PT FREQUENCY: 1-2x/week  PT DURATION: 4 weeks  PLANNED INTERVENTIONS: Therapeutic exercises, Therapeutic activity, Neuromuscular re-education,  Balance training, Gait training, Patient/Family education, Self Care, Joint mobilization, and Manual therapy  PLAN FOR NEXT SESSION: Gentle range of motion, seated BAPS board, per MD okay to transition to Tri-Lock brace and transition to wbat.   Loyal Holzheimer, [place], PT 12/11/2021, 12:40 PM

## 2021-12-17 DIAGNOSIS — C44311 Basal cell carcinoma of skin of nose: Secondary | ICD-10-CM | POA: Diagnosis not present

## 2021-12-18 ENCOUNTER — Encounter: Payer: Medicare Other | Admitting: Physical Therapy

## 2021-12-19 DIAGNOSIS — D225 Melanocytic nevi of trunk: Secondary | ICD-10-CM | POA: Diagnosis not present

## 2021-12-19 DIAGNOSIS — D485 Neoplasm of uncertain behavior of skin: Secondary | ICD-10-CM | POA: Diagnosis not present

## 2021-12-19 DIAGNOSIS — Z85820 Personal history of malignant melanoma of skin: Secondary | ICD-10-CM | POA: Diagnosis not present

## 2021-12-19 DIAGNOSIS — L814 Other melanin hyperpigmentation: Secondary | ICD-10-CM | POA: Diagnosis not present

## 2021-12-19 DIAGNOSIS — C44521 Squamous cell carcinoma of skin of breast: Secondary | ICD-10-CM | POA: Diagnosis not present

## 2021-12-19 DIAGNOSIS — D1801 Hemangioma of skin and subcutaneous tissue: Secondary | ICD-10-CM | POA: Diagnosis not present

## 2021-12-19 DIAGNOSIS — L821 Other seborrheic keratosis: Secondary | ICD-10-CM | POA: Diagnosis not present

## 2021-12-19 DIAGNOSIS — C44329 Squamous cell carcinoma of skin of other parts of face: Secondary | ICD-10-CM | POA: Diagnosis not present

## 2021-12-19 DIAGNOSIS — Z08 Encounter for follow-up examination after completed treatment for malignant neoplasm: Secondary | ICD-10-CM | POA: Diagnosis not present

## 2021-12-25 ENCOUNTER — Ambulatory Visit: Payer: Medicare Other

## 2021-12-25 DIAGNOSIS — M25671 Stiffness of right ankle, not elsewhere classified: Secondary | ICD-10-CM | POA: Diagnosis not present

## 2021-12-25 DIAGNOSIS — R600 Localized edema: Secondary | ICD-10-CM

## 2021-12-25 DIAGNOSIS — M25571 Pain in right ankle and joints of right foot: Secondary | ICD-10-CM

## 2021-12-25 DIAGNOSIS — Z9889 Other specified postprocedural states: Secondary | ICD-10-CM | POA: Diagnosis not present

## 2021-12-25 NOTE — Therapy (Signed)
OUTPATIENT PHYSICAL THERAPY LOWER EXTREMITY TREATMENT   Patient Name: Tiffany Brown MRN: [id number] DOB:May 26, 1945, 76 y.o., female Today's Date: 12/25/2021   PT End of Session - 12/25/21 0945     Visit Number 2    Number of Visits 8    Date for PT Re-Evaluation 01/08/22    PT Start Time 0945    PT Stop Time 6384    PT Time Calculation (min) 50 min    Activity Tolerance Patient tolerated treatment well    Behavior During Therapy WFL for tasks assessed/performed              Past Medical History:  Diagnosis Date   Abnormal glucose    Depression with anxiety    Essential hypertension, benign    Fibromyalgia    GERD (gastroesophageal reflux disease)    Lymphedema of both lower extremities    and stomach   Melanoma ([place])    Near syncope    Other and unspecified hyperlipidemia    Pre-diabetes    Past Surgical History:  Procedure Laterality Date   ABDOMINAL HYSTERECTOMY     BREAST SURGERY     LEFT   CHOLECYSTECTOMY     ELBOW SURGERY     X's 3 Left   IR ABLATE LIVER CRYOABLATION  02/15/2021   IR RADIOLOGIST EVAL & MGMT  02/08/2021   IR RADIOLOGIST EVAL & MGMT  03/14/2021   MELANOMA EXCISION Left 10/24/2021   Procedure: WIDE LOCAL EXCISION ADVANCEMENT FLAP CLOSURE LEFT UYPPER ARM MELANOMA;  Surgeon: Stark Klein, MD;  Location: [facility];  Service: General;  Laterality: Left;   RIGHT DISTAL RADIUS FRACTURE     TOTAL KNEE ARTHROPLASTY     RIGHT   Patient Active Problem List   Diagnosis Date Noted   Stasis dermatitis of both legs 12/24/2018   Degenerative disc disease, lumbar 12/24/2018   Fibromyalgia 12/24/2018   Vitamin D deficiency 12/24/2018   Class 3 severe obesity with serious comorbidity and body mass index (BMI) of 40.0 to 44.9 in adult ([place]) 07/23/2017   Closed fracture of right inferior pubic ramus (HCC) 11/18/2016   Gastroesophageal reflux disease 06/06/2016   Recurrent major depressive disorder, in full remission ([place]) 06/06/2016   DM type 2 with  diabetic dyslipidemia ([place]) 03/06/2016   Edema 09/14/2013   Precordial pain 08/20/2011   Essential hypertension, benign    Hyperlipidemia     REFERRING PROVIDER: Boneta Lucks DPM  REFERRING DIAG: S/p right foot surgery.  THERAPY DIAG:  Pain in right ankle and joints of right foot  Localized edema  Stiffness of right ankle, not elsewhere classified  Rationale for Evaluation and Treatment Rehabilitation  ONSET DATE: Recent surgery (10/29/21).  SUBJECTIVE:   SUBJECTIVE STATEMENT: Patient reports that her ankle is hurting more since her last appointment. She has a follow up with her physician on Friday (12/28/21). She is having pain up to her thigh when she tries to put her foot on the floor. She tried walking without her boot last week and it did not go well as it was really painful.   PERTINENT HISTORY: HTN, Fibromyalgia, left elbow surgery, right TKA, h/o left knee pain, h/o bilateral shoulder pain, removal of melanoma from left arm and face, statis dermatitis.  PAIN:  Are you having pain? Yes: NPRS scale: 7/10 Pain location: Left ankle. Pain description: Throbbing, sharp, shooting. Aggravating factors: Standing. Relieving factors: Staying off feet.  PRECAUTIONS: Other: Currently in right CAM boot.  WEIGHT BEARING RESTRICTIONS  WBAT over right  LE with CAM boot donned and use of Rollator.  FALLS:  Has patient fallen in last 6 months? Yes. Number of falls 1.  LIVING ENVIRONMENT: Lives in: House/apartment Has following equipment at home: Gilford Rile - 4 wheeled  OCCUPATION: Retired.  PLOF: Independent with basic ADLs  PATIENT GOALS Would like to be able to walk better and longer.   OBJECTIVE: all objective measures were assessed at her initial evaluation on 12/11/21 unless otherwise noted  EDEMA:  Circumferential: Bi-malleolar:  Right 2 cms >  left. PALPATION: C/o medial and lateral right ankle tenderness.  Incisions appear to be well healed.  LOWER EXTREMITY  ROM:  The patient's right ankle resting position is remarkable for 25 degrees of inversion.  She only has 10 degrees of active motion into the DF/PF plane of movement and 5 degrees into Inv/ever plane of movement.  GAIT: So and cautious gait with right CAM boot and use of Rollator.  TODAY'S TREATMENT:                                   8/22 EXERCISE LOG  Exercise Repetitions and Resistance Comments  Light ankle AROM                      Blank cell = exercise not performed today  Manual Therapy Soft Tissue Mobilization: anterior tibialis, for reduced pain Joint Mobilizations: subtalar, talocrural, and distal fibular , grade I-III for reduced pain and improved mobility Passive ROM: all planes, to tolerance   Modalities  Date:  Vaso: Ankle, 34 degrees, 15 mins, Pain and Edema  ASSESSMENT:  CLINICAL IMPRESSION: Treatment focused on manual therapy for reduced ankle pain and improved ROM. This was able to slightly reduce her familiar pain and improved her ankle mobility with light joint mobilizations and PROM being the most effective. This was followed by light ankle AROM for improved mobility. She reported feeling better upon the conclusion of treatment. She continues to require skilled physical therapy to address her remaining impairments to maximize her functional mobility.   OBJECTIVE IMPAIRMENTS Abnormal gait, decreased activity tolerance, difficulty walking, decreased ROM, increased edema, and pain.   ACTIVITY LIMITATIONS standing and locomotion level  PARTICIPATION LIMITATIONS: meal prep, cleaning, and laundry  PERSONAL FACTORS Time since onset of injury/illness/exacerbation and 1 comorbidity: Prior right ankle surgery  are also affecting patient's functional outcome.   REHAB POTENTIAL: Fair    CLINICAL DECISION MAKING: Stable/uncomplicated  EVALUATION COMPLEXITY: Low   GOALS:  LONG TERM GOALS: Target date: 01/22/2022   Ind with HEP. Baseline:  Goal status:  INITIAL  2.  Achieve right ankle neutral position. Baseline:  Goal status: INITIAL  3.  Walk 250 feet with right ankle braced and using Rollator with pain not > 4/10. Baseline:  Goal status: INITIAL     PLAN: PT FREQUENCY: 1-2x/week  PT DURATION: 4 weeks  PLANNED INTERVENTIONS: Therapeutic exercises, Therapeutic activity, Neuromuscular re-education, Balance training, Gait training, Patient/Family education, Self Care, Joint mobilization, and Manual therapy  PLAN FOR NEXT SESSION: Gentle range of motion, seated BAPS board, per MD okay to transition to Tri-Lock brace and transition to wbat.   Darlin Coco, PT 12/25/2021, 10:54 AM

## 2021-12-28 ENCOUNTER — Ambulatory Visit (INDEPENDENT_AMBULATORY_CARE_PROVIDER_SITE_OTHER): Payer: Medicare Other | Admitting: Podiatry

## 2021-12-28 DIAGNOSIS — M7671 Peroneal tendinitis, right leg: Secondary | ICD-10-CM

## 2021-12-28 DIAGNOSIS — Z9889 Other specified postprocedural states: Secondary | ICD-10-CM

## 2021-12-28 NOTE — Progress Notes (Signed)
Subjective:  Patient ID: Tiffany Brown, female    DOB: 1945/08/12,  MRN: [id number]  Chief Complaint  Patient presents with   Routine Post Op    POV #3 DOS 10/29/2021 RT REPAIR OF PERONEALTENDON W/ LENGTHENING OF POSTERIOR TIBAL TENDON    DOS: 10/29/2021 Procedure: Right peroneal tendon repair with with posterior tibial tendon lengthening  76 y.o. female returns for post-op check.  Patient states she is doing okay.  Minimal pain mild erythema.  Bandages clean dry and intact.  Review of Systems: Negative except as noted in the HPI. Denies N/V/F/Ch.  Past Medical History:  Diagnosis Date   Abnormal glucose    Depression with anxiety    Essential hypertension, benign    Fibromyalgia    GERD (gastroesophageal reflux disease)    Lymphedema of both lower extremities    and stomach   Melanoma ([place])    Near syncope    Other and unspecified hyperlipidemia    Pre-diabetes     Current Outpatient Medications:    amLODipine (NORVASC) 5 MG tablet, Take 1 tablet (5 mg total) by mouth daily., Disp: 90 tablet, Rfl: 3   buPROPion (WELLBUTRIN XL) 150 MG 24 hr tablet, Take 1 tablet (150 mg total) by mouth daily., Disp: 90 tablet, Rfl: 3   doxycycline (VIBRA-TABS) 100 MG tablet, Take 1 tablet (100 mg total) by mouth 2 (two) times daily., Disp: 28 tablet, Rfl: 0   DULoxetine (CYMBALTA) 60 MG capsule, Take 2 capsules (120 mg total) by mouth daily. (Patient taking differently: Take 60 mg by mouth 2 (two) times daily.), Disp: 180 capsule, Rfl: 3   fluticasone (FLONASE) 50 MCG/ACT nasal spray, Place 2 sprays into the nose daily as needed for rhinitis., Disp: , Rfl:    furosemide (LASIX) 40 MG tablet, Take 1 tablet (40 mg total) by mouth 2 (two) times daily as needed. (Patient taking differently: Take 40 mg by mouth 2 (two) times daily as needed for fluid or edema.), Disp: 180 tablet, Rfl: 3   gabapentin (NEURONTIN) 300 MG capsule, TAKE 1 CAPSULE BY MOUTH THREE TIMES A DAY, Disp: 90 capsule, Rfl: 3    ibuprofen (ADVIL) 800 MG tablet, Take 1 tablet (800 mg total) by mouth every 6 (six) hours as needed. (Patient taking differently: Take 800 mg by mouth every 6 (six) hours as needed for mild pain or moderate pain.), Disp: 60 tablet, Rfl: 1   ibuprofen (ADVIL) 800 MG tablet, Take 1 tablet (800 mg total) by mouth every 6 (six) hours as needed., Disp: 60 tablet, Rfl: 1   losartan (COZAAR) 100 MG tablet, Take 1 tablet (100 mg total) by mouth daily., Disp: 90 tablet, Rfl: 3   omeprazole (PRILOSEC) 40 MG capsule, Take 40 mg by mouth daily before breakfast., Disp: , Rfl:    OZEMPIC, 0.25 OR 0.5 MG/DOSE, 2 MG/3ML SOPN, Inject 0.25 mg into the skin every Friday. 0.25 mg14 days  0.5 mg 14 days, Disp: , Rfl:    pravastatin (PRAVACHOL) 80 MG tablet, Take 1 tablet (80 mg total) by mouth daily., Disp: 90 tablet, Rfl: 3  Social History   Tobacco Use  Smoking Status Former   Packs/day: 0.80   Years: 25.00   Total pack years: 20.00   Types: Cigarettes   Quit date: 05/06/1994   Years since quitting: 27.6  Smokeless Tobacco Never    No Active Allergies Objective:  There were no vitals filed for this visit. There is no height or weight on file to calculate BMI.  Constitutional Well developed. Well nourished.  Vascular Foot warm and well perfused. Capillary refill normal to all digits.   Neurologic Normal speech. Oriented to person, place, and time. Epicritic sensation to light touch grossly present bilaterally.  Dermatologic Peroneal tendon/lateral compartment strength 3 out of 5 posterior tibial/medial compartment strength 4 out of 5  Orthopedic: Mild tenderness to palpation noted about the surgical site.   Radiographs: None Assessment:   1. Status post foot surgery   2. Peroneal tendinitis, right     Plan:  Patient was evaluated and treated and all questions answered.  S/p foot surgery right -Progressing as expected post-operatively. -XR: See above -WB Status: Weightbearing as tolerated in  regular shoes with a Tri-Lock ankle brace -Sutures: Removed no clinical signs of Deis is noted.  No complication noted. -Medications: None -Continue physical therapy -Start ambulating with a Tri-Lock ankle brace and regular shoes need aggressive therapy to rotate the foot out.  Increased peroneal tendon strength  No follow-ups on file.

## 2021-12-29 ENCOUNTER — Other Ambulatory Visit: Payer: Self-pay | Admitting: Family Medicine

## 2021-12-29 DIAGNOSIS — E1169 Type 2 diabetes mellitus with other specified complication: Secondary | ICD-10-CM

## 2022-01-01 ENCOUNTER — Ambulatory Visit: Payer: Medicare Other | Admitting: Physical Therapy

## 2022-01-04 ENCOUNTER — Telehealth: Payer: Self-pay | Admitting: Family Medicine

## 2022-01-04 DIAGNOSIS — E1169 Type 2 diabetes mellitus with other specified complication: Secondary | ICD-10-CM

## 2022-01-04 NOTE — Telephone Encounter (Signed)
Pt called stating that she cant pick up her Ozempic refill at the pharmacy because they are telling her that its going to cost her $500+ and needs advise on what to do.  Also wants to know if Dr Dettinger thinks its ok for pt to go on a high protein diet?  Please advise and call patient.

## 2022-01-04 NOTE — Telephone Encounter (Signed)
A high-protein diet is fine, please do a referral for CCM to [facility] for help with medication management for the patient, we can also check for samples

## 2022-01-04 NOTE — Telephone Encounter (Signed)
Pt informed of all. One sample of Ozempic logged out for pt. She will come by today before 5 and pick up. CCM referral created. Pt aware.

## 2022-01-08 ENCOUNTER — Telehealth: Payer: Self-pay

## 2022-01-09 NOTE — Telephone Encounter (Signed)
This message was routed to appropriate staff to address.

## 2022-01-10 ENCOUNTER — Encounter: Payer: Self-pay | Admitting: Physical Therapy

## 2022-01-10 ENCOUNTER — Ambulatory Visit: Payer: Medicare Other | Attending: Podiatry | Admitting: Physical Therapy

## 2022-01-10 DIAGNOSIS — M25571 Pain in right ankle and joints of right foot: Secondary | ICD-10-CM | POA: Diagnosis not present

## 2022-01-10 DIAGNOSIS — R600 Localized edema: Secondary | ICD-10-CM | POA: Diagnosis not present

## 2022-01-10 DIAGNOSIS — M25671 Stiffness of right ankle, not elsewhere classified: Secondary | ICD-10-CM | POA: Diagnosis not present

## 2022-01-10 NOTE — Telephone Encounter (Signed)
Attempted to contact the patient 3 times but phone line has been busy.

## 2022-01-10 NOTE — Therapy (Signed)
OUTPATIENT PHYSICAL THERAPY LOWER EXTREMITY TREATMENT   Patient Name: Tiffany Brown MRN: [id number] DOB:[birth year], 76 y.o., female Today's Date: 01/10/2022   PT End of Session - 01/10/22 1117     Visit Number 3    Number of Visits 8    Date for PT Re-Evaluation 01/08/22    PT Start Time 1120    PT Stop Time 1202    PT Time Calculation (min) 42 min    Equipment Utilized During Treatment Other (comment)   Rollator   Activity Tolerance Patient tolerated treatment well    Behavior During Therapy WFL for tasks assessed/performed              Past Medical History:  Diagnosis Date   Abnormal glucose    Depression with anxiety    Essential hypertension, benign    Fibromyalgia    GERD (gastroesophageal reflux disease)    Lymphedema of both lower extremities    and stomach   Melanoma ([place])    Near syncope    Other and unspecified hyperlipidemia    Pre-diabetes    Past Surgical History:  Procedure Laterality Date   ABDOMINAL HYSTERECTOMY     BREAST SURGERY     LEFT   CHOLECYSTECTOMY     ELBOW SURGERY     X's 3 Left   IR ABLATE LIVER CRYOABLATION  02/15/2021   IR RADIOLOGIST EVAL & MGMT  02/08/2021   IR RADIOLOGIST EVAL & MGMT  03/14/2021   MELANOMA EXCISION Left 10/24/2021   Procedure: WIDE LOCAL EXCISION ADVANCEMENT FLAP CLOSURE LEFT UYPPER ARM MELANOMA;  Surgeon: Stark Klein, MD;  Location: [facility];  Service: General;  Laterality: Left;   RIGHT DISTAL RADIUS FRACTURE     TOTAL KNEE ARTHROPLASTY     RIGHT   Patient Active Problem List   Diagnosis Date Noted   Stasis dermatitis of both legs 12/24/2018   Degenerative disc disease, lumbar 12/24/2018   Fibromyalgia 12/24/2018   Vitamin D deficiency 12/24/2018   Class 3 severe obesity with serious comorbidity and body mass index (BMI) of 40.0 to 44.9 in adult ([place]) 07/23/2017   Closed fracture of right inferior pubic ramus ([place]) 11/18/2016   Gastroesophageal reflux disease 06/06/2016   Recurrent major depressive  disorder, in full remission ([place]) 06/06/2016   DM type 2 with diabetic dyslipidemia ([place]) 03/06/2016   Edema 09/14/2013   Precordial pain 08/20/2011   Essential hypertension, benign    Hyperlipidemia     REFERRING PROVIDER: Boneta Lucks DPM  REFERRING DIAG: S/p right foot surgery.  THERAPY DIAG:  Pain in right ankle and joints of right foot  Localized edema  Stiffness of right ankle, not elsewhere classified  Rationale for Evaluation and Treatment Rehabilitation  ONSET DATE: Recent surgery (10/29/21).  SUBJECTIVE:   SUBJECTIVE STATEMENT: Has not heard back from surgeon's office regarding the brace in the shoe.   PERTINENT HISTORY: HTN, Fibromyalgia, left elbow surgery, right TKA, h/o left knee pain, h/o bilateral shoulder pain, removal of melanoma from left arm and face, statis dermatitis.  PAIN:  Are you having pain? Yes: NPRS scale: 5/10 Pain location: Left foot Pain description: Throbbing, sharp, shooting. Aggravating factors: Standing. Relieving factors: Staying off feet.  PRECAUTIONS: Other: Currently in right CAM boot.  WEIGHT BEARING RESTRICTIONS  WBAT over right LE with CAM boot donned and use of Rollator.  FALLS:  Has patient fallen in last 6 months? Yes. Number of falls 1.  LIVING ENVIRONMENT: Lives in: House/apartment Has following equipment at home: Gilford Rile -  4 wheeled  OCCUPATION: Retired.  PLOF: Independent with basic ADLs  PATIENT GOALS Would like to be able to walk better and longer.   OBJECTIVE: all objective measures were assessed at her initial evaluation on 12/11/21 unless otherwise noted  EDEMA:  Circumferential: Bi-malleolar:  Right 2 cms >  left. PALPATION: C/o medial and lateral right ankle tenderness.  Incisions appear to be well healed.  LOWER EXTREMITY ROM:  The patient's right ankle resting position is remarkable for 25 degrees of inversion.  She only has 10 degrees of active motion into the DF/PF plane of movement and 5 degrees  into Inv/ever plane of movement.  GAIT: So and cautious gait with right CAM boot and use of Rollator.  TODAY'S TREATMENT:                                   8/22 EXERCISE LOG  Exercise Repetitions and Resistance Comments  Rockerboard DF/PF x5 min, Inv/Ev x4 min   Dynadisc DF/PF x3 min, Circles x2 min   Ankle circles X20 reps   AROM Inv/Ev X20 reps   Seated short foot X20 reps   Ankle Eversion Seated on towel x20 reps    Blank cell = exercise not performed today   Manual Therapy Passive ROM: R ankle, into eversion with overpressure for stretching    Modalities Date:01/10/2022  Vaso: Ankle, 34 degrees, 10 mins, Pain and Edema  ASSESSMENT:  CLINICAL IMPRESSION: Patient presented in clinic with reports of continued pain of the R ankle. Patient unable to wear the newest crossover brace in a shoe due to the excessive R ankle inversion. Patient limited with minimal ankle eversion especially actively. Different exercises initiated to promote more active ankle eversion especially as well as instrinsic foot strengthening. Passive ankle stretching completed into eversion which patient reported being pleasurable. PROM ankle eversion was initially very tight but a slight reduction of tightness noted as stretching progressed. Normal vasopnuematic response noted following removal of the modality.  OBJECTIVE IMPAIRMENTS Abnormal gait, decreased activity tolerance, difficulty walking, decreased ROM, increased edema, and pain.   ACTIVITY LIMITATIONS standing and locomotion level  PARTICIPATION LIMITATIONS: meal prep, cleaning, and laundry  PERSONAL FACTORS Time since onset of injury/illness/exacerbation and 1 comorbidity: Prior right ankle surgery  are also affecting patient's functional outcome.   REHAB POTENTIAL: Fair    CLINICAL DECISION MAKING: Stable/uncomplicated  EVALUATION COMPLEXITY: Low   GOALS:  LONG TERM GOALS: Target date: 01/22/2022   Ind with HEP. Baseline:  Goal status:  INITIAL  2.  Achieve right ankle neutral position. Baseline:  Goal status: INITIAL  3.  Walk 250 feet with right ankle braced and using Rollator with pain not > 4/10. Baseline:  Goal status: INITIAL     PLAN: PT FREQUENCY: 1-2x/week  PT DURATION: 4 weeks  PLANNED INTERVENTIONS: Therapeutic exercises, Therapeutic activity, Neuromuscular re-education, Balance training, Gait training, Patient/Family education, Self Care, Joint mobilization, and Manual therapy  PLAN FOR NEXT SESSION: Gentle range of motion, seated BAPS board, per MD okay to transition to Tri-Lock brace and transition to wbat.   Standley Brooking, PTA 01/10/2022, 12:06 PM

## 2022-01-16 ENCOUNTER — Encounter: Payer: Self-pay | Admitting: Physical Therapy

## 2022-01-16 ENCOUNTER — Ambulatory Visit: Payer: Medicare Other | Admitting: Physical Therapy

## 2022-01-16 DIAGNOSIS — M25571 Pain in right ankle and joints of right foot: Secondary | ICD-10-CM | POA: Diagnosis not present

## 2022-01-16 DIAGNOSIS — R600 Localized edema: Secondary | ICD-10-CM | POA: Diagnosis not present

## 2022-01-16 DIAGNOSIS — M25671 Stiffness of right ankle, not elsewhere classified: Secondary | ICD-10-CM | POA: Diagnosis not present

## 2022-01-16 NOTE — Therapy (Signed)
OUTPATIENT PHYSICAL THERAPY LOWER EXTREMITY TREATMENT   Patient Name: Tiffany Brown MRN: [id number] DOB:12-09-1945, 76 y.o., female Today's Date: 01/16/2022   PT End of Session - 01/16/22 1051     Visit Number 4    Number of Visits 8    Date for PT Re-Evaluation 01/08/22    PT Start Time 0950    PT Stop Time 3567    PT Time Calculation (min) 49 min    Activity Tolerance Patient tolerated treatment well    Behavior During Therapy WFL for tasks assessed/performed              Past Medical History:  Diagnosis Date   Abnormal glucose    Depression with anxiety    Essential hypertension, benign    Fibromyalgia    GERD (gastroesophageal reflux disease)    Lymphedema of both lower extremities    and stomach   Melanoma ([place])    Near syncope    Other and unspecified hyperlipidemia    Pre-diabetes    Past Surgical History:  Procedure Laterality Date   ABDOMINAL HYSTERECTOMY     BREAST SURGERY     LEFT   CHOLECYSTECTOMY     ELBOW SURGERY     X's 3 Left   IR ABLATE LIVER CRYOABLATION  02/15/2021   IR RADIOLOGIST EVAL & MGMT  02/08/2021   IR RADIOLOGIST EVAL & MGMT  03/14/2021   MELANOMA EXCISION Left 10/24/2021   Procedure: WIDE LOCAL EXCISION ADVANCEMENT FLAP CLOSURE LEFT UYPPER ARM MELANOMA;  Surgeon: Stark Klein, MD;  Location: [facility];  Service: General;  Laterality: Left;   RIGHT DISTAL RADIUS FRACTURE     TOTAL KNEE ARTHROPLASTY     RIGHT   Patient Active Problem List   Diagnosis Date Noted   Stasis dermatitis of both legs 12/24/2018   Degenerative disc disease, lumbar 12/24/2018   Fibromyalgia 12/24/2018   Vitamin D deficiency 12/24/2018   Class 3 severe obesity with serious comorbidity and body mass index (BMI) of 40.0 to 44.9 in adult ([place]) 07/23/2017   Closed fracture of right inferior pubic ramus (HCC) 11/18/2016   Gastroesophageal reflux disease 06/06/2016   Recurrent major depressive disorder, in full remission ([place]) 06/06/2016   DM type 2 with  diabetic dyslipidemia ([place]) 03/06/2016   Edema 09/14/2013   Precordial pain 08/20/2011   Essential hypertension, benign    Hyperlipidemia     REFERRING PROVIDER: Boneta Lucks DPM  REFERRING DIAG: S/p right foot surgery.  THERAPY DIAG:  Pain in right ankle and joints of right foot  Localized edema  Stiffness of right ankle, not elsewhere classified  Rationale for Evaluation and Treatment Rehabilitation  ONSET DATE: Recent surgery (10/29/21).  SUBJECTIVE:   SUBJECTIVE STATEMENT: Can't wear the brace because my ankle rolls over. PERTINENT HISTORY: HTN, Fibromyalgia, left elbow surgery, right TKA, h/o left knee pain, h/o bilateral shoulder pain, removal of melanoma from left arm and face, statis dermatitis.  PAIN:  Are you having pain? Yes: NPRS scale: 5/10 Pain location: Left foot Pain description: Throbbing, sharp, shooting. Aggravating factors: Standing. Relieving factors: Staying off feet.  PRECAUTIONS: Other: Currently in right CAM boot.  WEIGHT BEARING RESTRICTIONS  WBAT over right LE with CAM boot donned and use of Rollator.  FALLS:  Has patient fallen in last 6 months? Yes. Number of falls 1.  LIVING ENVIRONMENT: Lives in: House/apartment Has following equipment at home: Gilford Rile - 4 wheeled  OCCUPATION: Retired.  PLOF: Independent with basic ADLs  PATIENT GOALS Would like  to be able to walk better and longer.   OBJECTIVE: all objective measures were assessed at her initial evaluation on 12/11/21 unless otherwise noted  EDEMA:  Circumferential: Bi-malleolar:  Right 2 cms >  left. PALPATION: C/o medial and lateral right ankle tenderness.  Incisions appear to be well healed.  LOWER EXTREMITY ROM:  The patient's right ankle resting position is remarkable for 25 degrees of inversion.  She only has 10 degrees of active motion into the DF/PF plane of movement and 5 degrees into Inv/ever plane of movement.  GAIT: So and cautious gait with right CAM boot and  use of Rollator.  TODAY'S TREATMENT:                                   8/22 EXERCISE LOG  Exercise Repetitions and Resistance Comments  Rockerboard DF/PF x2 min, Inv/Ev x2 min     Manual Therapy Passive ROM: R ankle, into eversion with overpressure for stretching .  Performed passive stretching in a low load long duration fashion x 20 minutes Modalities Date:01/10/2022  Vaso: Ankle, 34 degrees, 15 mins, Pain and Edema  ASSESSMENT:  CLINICAL IMPRESSION: Patient presented in clinic with reports of continued pain of the R ankle. Patient unable to wear the newest crossover brace in a shoe due to the excessive R ankle inversion. She found LLLDS helpful.  She can achieve right ankle neutral position passively.  She continues to wear the CAM boot.  OBJECTIVE IMPAIRMENTS Abnormal gait, decreased activity tolerance, difficulty walking, decreased ROM, increased edema, and pain.   ACTIVITY LIMITATIONS standing and locomotion level  PARTICIPATION LIMITATIONS: meal prep, cleaning, and laundry  PERSONAL FACTORS Time since onset of injury/illness/exacerbation and 1 comorbidity: Prior right ankle surgery  are also affecting patient's functional outcome.   REHAB POTENTIAL: Fair    CLINICAL DECISION MAKING: Stable/uncomplicated  EVALUATION COMPLEXITY: Low   GOALS:  LONG TERM GOALS: Target date: 01/22/2022   Ind with HEP. Baseline:  Goal status: INITIAL  2.  Achieve right ankle neutral position. Baseline:  Goal status: INITIAL  3.  Walk 250 feet with right ankle braced and using Rollator with pain not > 4/10. Baseline:  Goal status: INITIAL     PLAN: PT FREQUENCY: 1-2x/week  PT DURATION: 4 weeks  PLANNED INTERVENTIONS: Therapeutic exercises, Therapeutic activity, Neuromuscular re-education, Balance training, Gait training, Patient/Family education, Self Care, Joint mobilization, and Manual therapy  PLAN FOR NEXT SESSION: Gentle range of motion, seated BAPS board, per MD okay  to transition to Tri-Lock brace and transition to wbat.   Kemisha Bonnette, [place], PT 01/16/2022, 10:58 AM

## 2022-01-17 ENCOUNTER — Telehealth: Payer: Self-pay

## 2022-01-17 DIAGNOSIS — C44529 Squamous cell carcinoma of skin of other part of trunk: Secondary | ICD-10-CM | POA: Diagnosis not present

## 2022-01-17 DIAGNOSIS — L820 Inflamed seborrheic keratosis: Secondary | ICD-10-CM | POA: Diagnosis not present

## 2022-01-17 DIAGNOSIS — L905 Scar conditions and fibrosis of skin: Secondary | ICD-10-CM | POA: Diagnosis not present

## 2022-01-17 NOTE — Chronic Care Management (AMB) (Unsigned)
  Chronic Care Management   Outreach Note  01/17/2022 Name: Tiffany Brown MRN: [id number] DOB: 09/23/1945  Tiffany Brown is a 76 y.o. year old female who is a primary care patient of Dettinger, Fransisca Kaufmann, MD. I reached out to [employer] by phone today in response to a referral sent by Ms. Tiffany Kitchens Wil[place]'s primary care provider.  An unsuccessful telephone outreach was attempted today. The patient was referred to the case management team for assistance with care management and care coordination.   Follow Up Plan: A HIPAA compliant phone message was left for the patient providing contact information and requesting a return call.  The care management team will reach out to the patient again over the next 7 days.  If patient returns call to provider office, please advise to call [place] * at [phone]*  Noreene Larsson, [place], [place] [address] Direct Dial: [phone] [email or web address]

## 2022-01-21 NOTE — Progress Notes (Signed)
[facility] Quality Team Note  Name: NIKEISHA KLUTZ Date of Birth: 06-19-1945 MRN: [id number] Date: 01/21/2022  [facility] Quality Team has reviewed this patient's chart, please see recommendations below:  [facility] Quality Other; (KED: Kidney Health Evaluation Gap- Patient needs Urine Albumin Creatinine Ratio Test completed for gap closure. EGFR has already been completed, Patient has upcoming appointment with [facility] 02/14/2022.)

## 2022-01-21 NOTE — Telephone Encounter (Signed)
Patient calling back to schedule appt. °

## 2022-01-22 NOTE — Chronic Care Management (AMB) (Signed)
  Chronic Care Management   Note  01/22/2022 Name: SHAMONICA SCHADT MRN: [id number] DOB: 10-Aug-1945  Abagail Kitchens Fedder is a 76 y.o. year old female who is a primary care patient of Dettinger, Fransisca Kaufmann, MD. I reached out to [place] by phone today in response to a referral sent by Ms. Abagail Kitchens [place]'s PCP.  Ms. Lowenstein was given information about Chronic Care Management services today including:  CCM service includes personalized support from designated clinical staff supervised by her physician, including individualized plan of care and coordination with other care providers 24/7 contact phone numbers for assistance for urgent and routine care needs. Service will only be billed when office clinical staff spend 20 minutes or more in a month to coordinate care. Only one practitioner may furnish and bill the service in a calendar month. The patient may stop CCM services at any time (effective at the end of the month) by phone call to the office staff. The patient is responsible for co-pay (up to 20% after annual deductible is met) if co-pay is required by the individual health plan.   Patient agreed to services and verbal consent obtained.   Follow up plan: Telephone appointment with care management team member scheduled for:03/01/2022  Noreene Larsson, [place], [place] [address] Direct Dial: [phone] [email or web address]

## 2022-01-23 ENCOUNTER — Encounter: Payer: Self-pay | Admitting: Physical Therapy

## 2022-01-23 ENCOUNTER — Ambulatory Visit: Payer: Medicare Other | Admitting: Physical Therapy

## 2022-01-23 DIAGNOSIS — M25571 Pain in right ankle and joints of right foot: Secondary | ICD-10-CM | POA: Diagnosis not present

## 2022-01-23 DIAGNOSIS — M25671 Stiffness of right ankle, not elsewhere classified: Secondary | ICD-10-CM

## 2022-01-23 DIAGNOSIS — R600 Localized edema: Secondary | ICD-10-CM

## 2022-01-23 NOTE — Therapy (Signed)
OUTPATIENT PHYSICAL THERAPY LOWER EXTREMITY TREATMENT   Patient Name: Tiffany Brown MRN: [id number] DOB:[birth year], 76 y.o., female Today's Date: 01/23/2022   PT End of Session - 01/23/22 1118     Visit Number 5    Number of Visits 8    Date for PT Re-Evaluation 01/08/22    PT Start Time 1118    PT Stop Time 1159    PT Time Calculation (min) 41 min    Activity Tolerance Patient tolerated treatment well    Behavior During Therapy WFL for tasks assessed/performed              Past Medical History:  Diagnosis Date   Abnormal glucose    Depression with anxiety    Essential hypertension, benign    Fibromyalgia    GERD (gastroesophageal reflux disease)    Lymphedema of both lower extremities    and stomach   Melanoma ([place])    Near syncope    Other and unspecified hyperlipidemia    Pre-diabetes    Past Surgical History:  Procedure Laterality Date   ABDOMINAL HYSTERECTOMY     BREAST SURGERY     LEFT   CHOLECYSTECTOMY     ELBOW SURGERY     X's 3 Left   IR ABLATE LIVER CRYOABLATION  02/15/2021   IR RADIOLOGIST EVAL & MGMT  02/08/2021   IR RADIOLOGIST EVAL & MGMT  03/14/2021   MELANOMA EXCISION Left 10/24/2021   Procedure: WIDE LOCAL EXCISION ADVANCEMENT FLAP CLOSURE LEFT UYPPER ARM MELANOMA;  Surgeon: Stark Klein, MD;  Location: [facility];  Service: General;  Laterality: Left;   RIGHT DISTAL RADIUS FRACTURE     TOTAL KNEE ARTHROPLASTY     RIGHT   Patient Active Problem List   Diagnosis Date Noted   Stasis dermatitis of both legs 12/24/2018   Degenerative disc disease, lumbar 12/24/2018   Fibromyalgia 12/24/2018   Vitamin D deficiency 12/24/2018   Class 3 severe obesity with serious comorbidity and body mass index (BMI) of 40.0 to 44.9 in adult ([place]) 07/23/2017   Closed fracture of right inferior pubic ramus ([place]) 11/18/2016   Gastroesophageal reflux disease 06/06/2016   Recurrent major depressive disorder, in full remission ([place]) 06/06/2016   DM type 2 with  diabetic dyslipidemia ([place]) 03/06/2016   Edema 09/14/2013   Precordial pain 08/20/2011   Essential hypertension, benign    Hyperlipidemia     REFERRING PROVIDER: Boneta Lucks DPM  REFERRING DIAG: S/p right foot surgery.  THERAPY DIAG:  Pain in right ankle and joints of right foot  Localized edema  Stiffness of right ankle, not elsewhere classified  Rationale for Evaluation and Treatment Rehabilitation  ONSET DATE: Recent surgery (10/29/21).  SUBJECTIVE:   SUBJECTIVE STATEMENT: Having a bad day for pain. Pain is constant but some days are worse than others. Pain is more general though as she has been having more L knee pain, L elbow pain and has fibromylgia.  PERTINENT HISTORY: HTN, Fibromyalgia, left elbow surgery, right TKA, h/o left knee pain, h/o bilateral shoulder pain, removal of melanoma from left arm and face, statis dermatitis.  PAIN:  Are you having pain? Yes 7-8/10 generalized pain  PRECAUTIONS: Other: Currently in right CAM boot.  WEIGHT BEARING RESTRICTIONS  WBAT over right LE with CAM boot donned and use of Rollator.  OBJECTIVE: all objective measures were assessed at her initial evaluation on 12/11/21 unless otherwise noted  EDEMA:  Circumferential: Bi-malleolar:  Right 2 cms >  left.  PALPATION: C/o medial and  lateral right ankle tenderness.  Incisions appear to be well healed.  LOWER EXTREMITY ROM: The patient's right ankle resting position is remarkable for 25 degrees of inversion.  She only has 10 degrees of active motion into the DF/PF plane of movement and 5 degrees into Inv/ever plane of movement.  GAIT: So and cautious gait with right CAM boot and use of Rollator.  TODAY'S TREATMENT:                                   9/20 EXERCISE LOG  Exercise Repetitions and Resistance Comments  Rockerboard DF/PF x2 min, Inv/Ev x2 min   Dynadisc Circles x2 min     Manual Therapy Passive ROM: R ankle, into eversion with overpressure for stretching  .  Modalities Date:01/23/2022  Vaso: Ankle, 34 degrees, 15 mins, Pain and Edema  ASSESSMENT:  CLINICAL IMPRESSION: Patient arriving more in pain than normal as she has multiple active pain sites today. Has been unable to contact her surgeon's office recently. Limited therex completed today but more stretching into eversion completed today. Normal vasopnuematic response noted following removal of the modalities.  OBJECTIVE IMPAIRMENTS Abnormal gait, decreased activity tolerance, difficulty walking, decreased ROM, increased edema, and pain.   ACTIVITY LIMITATIONS standing and locomotion level  PARTICIPATION LIMITATIONS: meal prep, cleaning, and laundry  PERSONAL FACTORS Time since onset of injury/illness/exacerbation and 1 comorbidity: Prior right ankle surgery  are also affecting patient's functional outcome.   REHAB POTENTIAL: Fair    CLINICAL DECISION MAKING: Stable/uncomplicated  EVALUATION COMPLEXITY: Low   GOALS:  LONG TERM GOALS: Target date: 01/22/2022   Ind with HEP. Baseline:  Goal status: INITIAL  2.  Achieve right ankle neutral position. Baseline:  Goal status: INITIAL  3.  Walk 250 feet with right ankle braced and using Rollator with pain not > 4/10. Baseline:  Goal status: INITIAL     PLAN: PT FREQUENCY: 1-2x/week  PT DURATION: 4 weeks  PLANNED INTERVENTIONS: Therapeutic exercises, Therapeutic activity, Neuromuscular re-education, Balance training, Gait training, Patient/Family education, Self Care, Joint mobilization, and Manual therapy  PLAN FOR NEXT SESSION: Gentle range of motion, seated BAPS board, per MD okay to transition to Tri-Lock brace and transition to wbat.   Standley Brooking, PTA 01/23/2022, 12:19 PM

## 2022-01-30 ENCOUNTER — Ambulatory Visit: Payer: Medicare Other | Admitting: Physical Therapy

## 2022-01-30 ENCOUNTER — Encounter: Payer: Self-pay | Admitting: Physical Therapy

## 2022-01-30 DIAGNOSIS — M25671 Stiffness of right ankle, not elsewhere classified: Secondary | ICD-10-CM | POA: Diagnosis not present

## 2022-01-30 DIAGNOSIS — R600 Localized edema: Secondary | ICD-10-CM

## 2022-01-30 DIAGNOSIS — M25571 Pain in right ankle and joints of right foot: Secondary | ICD-10-CM | POA: Diagnosis not present

## 2022-01-30 NOTE — Therapy (Signed)
OUTPATIENT PHYSICAL THERAPY LOWER EXTREMITY TREATMENT   Patient Name: Tiffany Brown MRN: [id number] DOB:05/20/1945, 76 y.o., female Today's Date: 01/30/2022   PT End of Session - 01/30/22 1154     Visit Number 6    Number of Visits 8    Date for PT Re-Evaluation 01/08/22    PT Start Time 1118    PT Stop Time 1155    PT Time Calculation (min) 37 min    Activity Tolerance Patient tolerated treatment well    Behavior During Therapy WFL for tasks assessed/performed              Past Medical History:  Diagnosis Date   Abnormal glucose    Depression with anxiety    Essential hypertension, benign    Fibromyalgia    GERD (gastroesophageal reflux disease)    Lymphedema of both lower extremities    and stomach   Melanoma ([place])    Near syncope    Other and unspecified hyperlipidemia    Pre-diabetes    Past Surgical History:  Procedure Laterality Date   ABDOMINAL HYSTERECTOMY     BREAST SURGERY     LEFT   CHOLECYSTECTOMY     ELBOW SURGERY     X's 3 Left   IR ABLATE LIVER CRYOABLATION  02/15/2021   IR RADIOLOGIST EVAL & MGMT  02/08/2021   IR RADIOLOGIST EVAL & MGMT  03/14/2021   MELANOMA EXCISION Left 10/24/2021   Procedure: WIDE LOCAL EXCISION ADVANCEMENT FLAP CLOSURE LEFT UYPPER ARM MELANOMA;  Surgeon: Stark Klein, MD;  Location: [facility];  Service: General;  Laterality: Left;   RIGHT DISTAL RADIUS FRACTURE     TOTAL KNEE ARTHROPLASTY     RIGHT   Patient Active Problem List   Diagnosis Date Noted   Stasis dermatitis of both legs 12/24/2018   Degenerative disc disease, lumbar 12/24/2018   Fibromyalgia 12/24/2018   Vitamin D deficiency 12/24/2018   Class 3 severe obesity with serious comorbidity and body mass index (BMI) of 40.0 to 44.9 in adult ([place]) 07/23/2017   Closed fracture of right inferior pubic ramus (HCC) 11/18/2016   Gastroesophageal reflux disease 06/06/2016   Recurrent major depressive disorder, in full remission ([place]) 06/06/2016   DM type 2 with  diabetic dyslipidemia ([place]) 03/06/2016   Edema 09/14/2013   Precordial pain 08/20/2011   Essential hypertension, benign    Hyperlipidemia     REFERRING PROVIDER: Boneta Lucks DPM  REFERRING DIAG: S/p right foot surgery.  THERAPY DIAG:  Pain in right ankle and joints of right foot  Localized edema  Stiffness of right ankle, not elsewhere classified  Rationale for Evaluation and Treatment Rehabilitation  ONSET DATE: Recent surgery (10/29/21).  SUBJECTIVE:   SUBJECTIVE STATEMENT: Patient presented with reports of generalized discomfort and fibromyalgia.  PERTINENT HISTORY: HTN, Fibromyalgia, left elbow surgery, right TKA, h/o left knee pain, h/o bilateral shoulder pain, removal of melanoma from left arm and face, statis dermatitis.  PAIN:  Are you having pain? Yes 5/10 R foot  PRECAUTIONS: Other: Currently in right CAM boot.  WEIGHT BEARING RESTRICTIONS  WBAT over right LE with CAM boot donned and use of Rollator.  OBJECTIVE: all objective measures were assessed at her initial evaluation on 12/11/21 unless otherwise noted  EDEMA:  Circumferential: Bi-malleolar:  Right 2 cms >  left.  PALPATION: C/o medial and lateral right ankle tenderness.  Incisions appear to be well healed.  LOWER EXTREMITY ROM: The patient's right ankle resting position is remarkable for 25 degrees of  inversion.  She only has 10 degrees of active motion into the DF/PF plane of movement and 5 degrees into Inv/ever plane of movement.  GAIT: So and cautious gait with right CAM boot and use of Rollator.  TODAY'S TREATMENT:                                   9/20 EXERCISE LOG  Exercise Repetitions and Resistance Comments  Rockerboard DF/PF x5 min   Dynadisc Circles x2 min   AROM ankle eversion Minimal range noted   Towel slides into eversion For visual cues      Manual Therapy Passive ROM: R ankle, into eversion with overpressure for stretching .  Modalities Date:01/30/2022  Vaso: Ankle, 34  degrees, 10 mins, Pain and Edema  ASSESSMENT:  CLINICAL IMPRESSION: Patient arrived in clinic with mod R ankle pain but continues to experience fibromyalgia related pain as well. Patient frustrated by lack of ankle correction and cannot walk in a normal shoe due to the R ankle fixation into inversion. Patient has minimal active ROM into eversion as attempted with therex. Patient stated that passive stretching into eversion felt good. Normal vasopneumatic response noted following removal of the modality.  OBJECTIVE IMPAIRMENTS Abnormal gait, decreased activity tolerance, difficulty walking, decreased ROM, increased edema, and pain.   ACTIVITY LIMITATIONS standing and locomotion level  PARTICIPATION LIMITATIONS: meal prep, cleaning, and laundry  PERSONAL FACTORS Time since onset of injury/illness/exacerbation and 1 comorbidity: Prior right ankle surgery  are also affecting patient's functional outcome.   REHAB POTENTIAL: Fair    CLINICAL DECISION MAKING: Stable/uncomplicated  EVALUATION COMPLEXITY: Low   GOALS:  LONG TERM GOALS: Target date: 01/22/2022   Ind with HEP. Baseline:  Goal status: INITIAL  2.  Achieve right ankle neutral position. Baseline:  Goal status: INITIAL  3.  Walk 250 feet with right ankle braced and using Rollator with pain not > 4/10. Baseline:  Goal status: INITIAL     PLAN: PT FREQUENCY: 1-2x/week  PT DURATION: 4 weeks  PLANNED INTERVENTIONS: Therapeutic exercises, Therapeutic activity, Neuromuscular re-education, Balance training, Gait training, Patient/Family education, Self Care, Joint mobilization, and Manual therapy  PLAN FOR NEXT SESSION: Gentle range of motion, seated BAPS board, per MD okay to transition to Tri-Lock brace and transition to wbat.   Standley Brooking, PTA 01/30/2022, 3:12 PM

## 2022-02-01 ENCOUNTER — Other Ambulatory Visit: Payer: Self-pay | Admitting: Podiatry

## 2022-02-01 NOTE — Telephone Encounter (Signed)
Please advise 

## 2022-02-04 DIAGNOSIS — R030 Elevated blood-pressure reading, without diagnosis of hypertension: Secondary | ICD-10-CM | POA: Diagnosis not present

## 2022-02-04 DIAGNOSIS — K219 Gastro-esophageal reflux disease without esophagitis: Secondary | ICD-10-CM | POA: Diagnosis not present

## 2022-02-04 DIAGNOSIS — R7303 Prediabetes: Secondary | ICD-10-CM | POA: Diagnosis not present

## 2022-02-04 DIAGNOSIS — Z1389 Encounter for screening for other disorder: Secondary | ICD-10-CM | POA: Diagnosis not present

## 2022-02-05 ENCOUNTER — Telehealth: Payer: Self-pay | Admitting: Podiatry

## 2022-02-05 MED ORDER — GABAPENTIN 300 MG PO CAPS: 300.0000 mg | ORAL_CAPSULE | Freq: Three times a day (TID) | ORAL | 3 refills | Status: DC

## 2022-02-05 NOTE — Telephone Encounter (Signed)
Pt called in & requested a refill on her Rx for Neurontin; she is currently out of the Rx. She's uses the [facility] in [place], [place]. Please advise.

## 2022-02-06 ENCOUNTER — Encounter: Payer: Self-pay | Admitting: Physical Therapy

## 2022-02-06 ENCOUNTER — Ambulatory Visit: Payer: Medicare Other | Attending: Podiatry | Admitting: Physical Therapy

## 2022-02-06 DIAGNOSIS — M25671 Stiffness of right ankle, not elsewhere classified: Secondary | ICD-10-CM | POA: Insufficient documentation

## 2022-02-06 DIAGNOSIS — R600 Localized edema: Secondary | ICD-10-CM | POA: Diagnosis not present

## 2022-02-06 DIAGNOSIS — M25571 Pain in right ankle and joints of right foot: Secondary | ICD-10-CM | POA: Diagnosis not present

## 2022-02-06 NOTE — Therapy (Signed)
OUTPATIENT PHYSICAL THERAPY LOWER EXTREMITY TREATMENT   Patient Name: Tiffany Brown MRN: [id number] DOB:August 19, 1945, 76 y.o., female Today's Date: 02/06/2022   PT End of Session - 02/06/22 1119     Visit Number 7    Number of Visits 8    Date for PT Re-Evaluation 01/08/22    PT Start Time 1119    PT Stop Time 1203    PT Time Calculation (min) 44 min    Activity Tolerance Patient tolerated treatment well    Behavior During Therapy WFL for tasks assessed/performed              Past Medical History:  Diagnosis Date   Abnormal glucose    Depression with anxiety    Essential hypertension, benign    Fibromyalgia    GERD (gastroesophageal reflux disease)    Lymphedema of both lower extremities    and stomach   Melanoma ([place])    Near syncope    Other and unspecified hyperlipidemia    Pre-diabetes    Past Surgical History:  Procedure Laterality Date   ABDOMINAL HYSTERECTOMY     BREAST SURGERY     LEFT   CHOLECYSTECTOMY     ELBOW SURGERY     X's 3 Left   IR ABLATE LIVER CRYOABLATION  02/15/2021   IR RADIOLOGIST EVAL & MGMT  02/08/2021   IR RADIOLOGIST EVAL & MGMT  03/14/2021   MELANOMA EXCISION Left 10/24/2021   Procedure: WIDE LOCAL EXCISION ADVANCEMENT FLAP CLOSURE LEFT UYPPER ARM MELANOMA;  Surgeon: Stark Klein, MD;  Location: [facility];  Service: General;  Laterality: Left;   RIGHT DISTAL RADIUS FRACTURE     TOTAL KNEE ARTHROPLASTY     RIGHT   Patient Active Problem List   Diagnosis Date Noted   Stasis dermatitis of both legs 12/24/2018   Degenerative disc disease, lumbar 12/24/2018   Fibromyalgia 12/24/2018   Vitamin D deficiency 12/24/2018   Class 3 severe obesity with serious comorbidity and body mass index (BMI) of 40.0 to 44.9 in adult ([place]) 07/23/2017   Closed fracture of right inferior pubic ramus (HCC) 11/18/2016   Gastroesophageal reflux disease 06/06/2016   Recurrent major depressive disorder, in full remission ([place]) 06/06/2016   DM type 2 with  diabetic dyslipidemia ([place]) 03/06/2016   Edema 09/14/2013   Precordial pain 08/20/2011   Essential hypertension, benign    Hyperlipidemia     REFERRING PROVIDER: Boneta Lucks DPM  REFERRING DIAG: S/p right foot surgery.  THERAPY DIAG:  Pain in right ankle and joints of right foot  Localized edema  Stiffness of right ankle, not elsewhere classified  Rationale for Evaluation and Treatment Rehabilitation  ONSET DATE: Recent surgery (10/29/21).   SUBJECTIVE:   SUBJECTIVE STATEMENT: Reports that she may feel somewhat better but cannot get in touch with her doctor. Goes back to surgeon on 02/15/2022.  PERTINENT HISTORY: HTN, Fibromyalgia, left elbow surgery, right TKA, h/o left knee pain, h/o bilateral shoulder pain, removal of melanoma from left arm and face, statis dermatitis.  PAIN:  Are you having pain? Yes 4/10 R foot  PRECAUTIONS: Other: Currently in right CAM boot.  WEIGHT BEARING RESTRICTIONS  WBAT over right LE with CAM boot donned and use of Rollator.  OBJECTIVE: all objective measures were assessed at her initial evaluation on 12/11/21 unless otherwise noted  TODAY'S TREATMENT:  10/4 EXERCISE LOG  Exercise Repetitions and Resistance Comments  Rockerboard DF/PF x5 min, Inv/Ev x3 min   Static ankle inversion stretch X5 min   Dynadisc DF/PF x2 min   AROM ankle eversion Minimal range noted   Towel slides into eversion For visual cues    Ankle eversion isometric X15 reps 3 sec holds   Resisted ankle eversion Yellow x20 reps     Manual Therapy Passive ROM: R ankle, into eversion with overpressure for stretching .  Modalities Date:02/06/2022  Vaso: Ankle, 34 degrees, 10 mins, Pain and Edema  ASSESSMENT:  CLINICAL IMPRESSION: Patient presented in clinic with reports of less pain but ankle still sits in ankle inversion. Patient states that now her latest CAM boot is starting to mold to ankle inversion as well. More stretching  actively and passively completed to stretch ankle invertors and strengthen evertors. Normal vasopnuematic response noted following removal of the modality.  OBJECTIVE IMPAIRMENTS Abnormal gait, decreased activity tolerance, difficulty walking, decreased ROM, increased edema, and pain.   ACTIVITY LIMITATIONS standing and locomotion level  PARTICIPATION LIMITATIONS: meal prep, cleaning, and laundry  PERSONAL FACTORS Time since onset of injury/illness/exacerbation and 1 comorbidity: Prior right ankle surgery  are also affecting patient's functional outcome.   REHAB POTENTIAL: Fair    CLINICAL DECISION MAKING: Stable/uncomplicated  EVALUATION COMPLEXITY: Low   GOALS:  LONG TERM GOALS: Target date: 01/22/2022   Ind with HEP. Baseline:  Goal status: On-going  2.  Achieve right ankle neutral position. Baseline:  Goal status: On-going  3.  Walk 250 feet with right ankle braced and using Rollator with pain not > 4/10. Baseline:  Goal status: On-going     PLAN: PT FREQUENCY: 1-2x/week  PT DURATION: 4 weeks  PLANNED INTERVENTIONS: Therapeutic exercises, Therapeutic activity, Neuromuscular re-education, Balance training, Gait training, Patient/Family education, Self Care, Joint mobilization, and Manual therapy  PLAN FOR NEXT SESSION: Gentle range of motion, seated BAPS board, per MD okay to transition to Tri-Lock brace and transition to wbat.   Standley Brooking, PTA 02/06/2022, 12:10 PM

## 2022-02-06 NOTE — Addendum Note (Signed)
Addended by: Arleny Kruger, [place] W on: 02/06/2022 12:29 PM   Modules accepted: Orders

## 2022-02-11 DIAGNOSIS — C44329 Squamous cell carcinoma of skin of other parts of face: Secondary | ICD-10-CM | POA: Diagnosis not present

## 2022-02-13 ENCOUNTER — Encounter: Payer: Medicare Other | Admitting: Podiatry

## 2022-02-14 ENCOUNTER — Encounter: Payer: Self-pay | Admitting: *Deleted

## 2022-02-14 ENCOUNTER — Ambulatory Visit (INDEPENDENT_AMBULATORY_CARE_PROVIDER_SITE_OTHER): Payer: Medicare Other | Admitting: Family Medicine

## 2022-02-14 ENCOUNTER — Encounter: Payer: Self-pay | Admitting: Family Medicine

## 2022-02-14 ENCOUNTER — Ambulatory Visit: Payer: Medicare Other | Admitting: *Deleted

## 2022-02-14 VITALS — BP 144/64 | HR 87 | Temp 98.4°F | Ht 65.0 in | Wt 281.0 lb

## 2022-02-14 DIAGNOSIS — E782 Mixed hyperlipidemia: Secondary | ICD-10-CM

## 2022-02-14 DIAGNOSIS — M25671 Stiffness of right ankle, not elsewhere classified: Secondary | ICD-10-CM | POA: Diagnosis not present

## 2022-02-14 DIAGNOSIS — M25571 Pain in right ankle and joints of right foot: Secondary | ICD-10-CM

## 2022-02-14 DIAGNOSIS — I10 Essential (primary) hypertension: Secondary | ICD-10-CM | POA: Diagnosis not present

## 2022-02-14 DIAGNOSIS — N3941 Urge incontinence: Secondary | ICD-10-CM

## 2022-02-14 DIAGNOSIS — E785 Hyperlipidemia, unspecified: Secondary | ICD-10-CM | POA: Diagnosis not present

## 2022-02-14 DIAGNOSIS — E1169 Type 2 diabetes mellitus with other specified complication: Secondary | ICD-10-CM

## 2022-02-14 DIAGNOSIS — R600 Localized edema: Secondary | ICD-10-CM

## 2022-02-14 LAB — BAYER DCA HB A1C WAIVED: HB A1C (BAYER DCA - WAIVED): 6.6 % — ABNORMAL HIGH (ref 4.8–5.6)

## 2022-02-14 MED ORDER — SEMAGLUTIDE (1 MG/DOSE) 4 MG/3ML ~~LOC~~ SOPN: 1.0000 mg | PEN_INJECTOR | SUBCUTANEOUS | 3 refills | Status: DC

## 2022-02-14 NOTE — Progress Notes (Signed)
BP (!) 144/64   Pulse 87   Temp 98.4 F (36.9 C)   Ht '5\' 5"'$  (1.651 m)   Wt 281 lb (127.5 kg)   SpO2 97%   BMI 46.76 kg/m    Subjective:   Patient ID: Tiffany Brown, female    DOB: 05-13-1945, 76 y.o.   MRN: [id number]  HPI: Tiffany Brown is a 76 y.o. female presenting on 02/14/2022 for Medical Management of Chronic Issues, Hypertension, Hyperlipidemia, and Diabetes   HPI Type 2 diabetes mellitus Patient comes in today for recheck of his diabetes. Patient has been currently taking no medication, has been diet controlled. Patient is currently on an ACE inhibitor/ARB. Patient has not seen an ophthalmologist this year. Patient denies any issues with their feet. The symptom started onset as an adult hypertension and hyperlipidemia ARE RELATED TO DM   Hypertension Patient is currently on amlodipine and furosemide and losartan, and their blood pressure today is 144/64. Patient denies any lightheadedness or dizziness. Patient denies headaches, blurred vision, chest pains, shortness of breath, or weakness. Denies any side effects from medication and is content with current medication.   Hyperlipidemia Patient is coming in for recheck of his hyperlipidemia. The patient is currently taking pravastatin. They deny any issues with myalgias or history of liver damage from it. They deny any focal numbness or weakness or chest pain.   Relevant past medical, surgical, family and social history reviewed and updated as indicated. Interim medical history since our last visit reviewed. Allergies and medications reviewed and updated.  Review of Systems  Constitutional:  Negative for chills and fever.  Eyes:  Negative for visual disturbance.  Respiratory:  Negative for chest tightness and shortness of breath.   Cardiovascular:  Negative for chest pain and leg swelling.  Musculoskeletal:  Negative for back pain and gait problem.  Skin:  Negative for rash.  Neurological:  Negative for dizziness,  light-headedness and headaches.  Psychiatric/Behavioral:  Negative for agitation and behavioral problems.   All other systems reviewed and are negative.   Per HPI unless specifically indicated above   Allergies as of 02/14/2022   No Active Allergies      Medication List        Accurate as of February 14, 2022  1:50 PM. If you have any questions, ask your nurse or doctor.          STOP taking these medications    Ozempic (0.25 or 0.5 MG/DOSE) 2 MG/3ML Sopn Generic drug: Semaglutide(0.25 or 0.'5MG'$ /DOS) Stopped by: Fransisca Kaufmann Loral Campi, MD       TAKE these medications    amLODipine 5 MG tablet Commonly known as: NORVASC Take 1 tablet (5 mg total) by mouth daily.   buPROPion 150 MG 24 hr tablet Commonly known as: Wellbutrin XL Take 1 tablet (150 mg total) by mouth daily.   doxycycline 100 MG tablet Commonly known as: VIBRA-TABS Take 1 tablet (100 mg total) by mouth 2 (two) times daily.   DULoxetine 60 MG capsule Commonly known as: CYMBALTA Take 2 capsules (120 mg total) by mouth daily. What changed:  how much to take when to take this   fluticasone 50 MCG/ACT nasal spray Commonly known as: FLONASE Place 2 sprays into the nose daily as needed for rhinitis.   furosemide 40 MG tablet Commonly known as: LASIX Take 1 tablet (40 mg total) by mouth 2 (two) times daily as needed. What changed: reasons to take this   gabapentin 300 MG capsule Commonly  known as: NEURONTIN TAKE 1 CAPSULE BY MOUTH THREE TIMES A DAY   gabapentin 300 MG capsule Commonly known as: NEURONTIN Take 1 capsule (300 mg total) by mouth 3 (three) times daily.   ibuprofen 800 MG tablet Commonly known as: ADVIL Take 1 tablet (800 mg total) by mouth every 6 (six) hours as needed. What changed: reasons to take this   ibuprofen 800 MG tablet Commonly known as: ADVIL Take 1 tablet (800 mg total) by mouth every 6 (six) hours as needed. What changed: Another medication with the same name was  changed. Make sure you understand how and when to take each.   losartan 100 MG tablet Commonly known as: COZAAR Take 1 tablet (100 mg total) by mouth daily.   omeprazole 40 MG capsule Commonly known as: PRILOSEC Take 40 mg by mouth daily before breakfast.   pravastatin 80 MG tablet Commonly known as: PRAVACHOL Take 1 tablet (80 mg total) by mouth daily.         Objective:   BP (!) 144/64   Pulse 87   Temp 98.4 F (36.9 C)   Ht '5\' 5"'$  (1.651 m)   Wt 281 lb (127.5 kg)   SpO2 97%   BMI 46.76 kg/m   Wt Readings from Last 3 Encounters:  02/14/22 281 lb (127.5 kg)  11/14/21 271 lb (122.9 kg)  10/24/21 267 lb (121.1 kg)    Physical Exam Vitals and nursing note reviewed.  Constitutional:      General: She is not in acute distress.    Appearance: She is well-developed. She is not diaphoretic.  Eyes:     Conjunctiva/sclera: Conjunctivae normal.  Cardiovascular:     Rate and Rhythm: Normal rate and regular rhythm.     Heart sounds: Normal heart sounds. No murmur heard. Pulmonary:     Effort: Pulmonary effort is normal. No respiratory distress.     Breath sounds: Normal breath sounds. No wheezing.  Musculoskeletal:        General: No swelling or tenderness. Normal range of motion.  Skin:    General: Skin is warm and dry.     Findings: No rash.  Neurological:     Mental Status: She is alert and oriented to person, place, and time.     Coordination: Coordination normal.  Psychiatric:        Behavior: Behavior normal.     A1c 6.6  Assessment & Plan:   Problem List Items Addressed This Visit       Cardiovascular and Mediastinum   Essential hypertension, benign     Endocrine   DM type 2 with diabetic dyslipidemia ([place]) - Primary   Relevant Orders   Bayer DCA Hb A1c Waived   Microalbumin / creatinine urine ratio     Other   Hyperlipidemia   Other Visit Diagnoses     Urge incontinence           A1c is up but still controlled at 6.6, will increase her  Ozempic, she is dealing with a lot with her foot surgery so she is less mobile so that is why her weight and A1c are probably up.  Gave sample of Myrbetriq for urge incontinence that she can try. Follow up plan: Return in about 3 months (around 05/17/2022), or if symptoms worsen or fail to improve, for Diabetes recheck.  Counseling provided for all of the vaccine components Orders Placed This Encounter  Procedures   Bayer DCA Hb A1c Waived   Microalbumin / creatinine  urine ratio    Caryl Pina, MD [facility] 02/14/2022, 1:50 PM

## 2022-02-14 NOTE — Therapy (Signed)
OUTPATIENT PHYSICAL THERAPY LOWER EXTREMITY TREATMENT   Patient Name: Tiffany Brown MRN: [id number] DOB:11-28-1945, 76 y.o., female Today's Date: 02/14/2022   PT End of Session - 02/14/22 1124     Visit Number 8    Number of Visits 8    Date for PT Re-Evaluation 02/13/22    PT Start Time 1115    PT Stop Time 1202    PT Time Calculation (min) 47 min    Equipment Utilized During Treatment Other (comment)    Activity Tolerance Patient tolerated treatment well              Past Medical History:  Diagnosis Date   Abnormal glucose    Depression with anxiety    Essential hypertension, benign    Fibromyalgia    GERD (gastroesophageal reflux disease)    Lymphedema of both lower extremities    and stomach   Melanoma ([place])    Near syncope    Other and unspecified hyperlipidemia    Pre-diabetes    Past Surgical History:  Procedure Laterality Date   ABDOMINAL HYSTERECTOMY     BREAST SURGERY     LEFT   CHOLECYSTECTOMY     ELBOW SURGERY     X's 3 Left   IR ABLATE LIVER CRYOABLATION  02/15/2021   IR RADIOLOGIST EVAL & MGMT  02/08/2021   IR RADIOLOGIST EVAL & MGMT  03/14/2021   MELANOMA EXCISION Left 10/24/2021   Procedure: WIDE LOCAL EXCISION ADVANCEMENT FLAP CLOSURE LEFT UYPPER ARM MELANOMA;  Surgeon: Stark Klein, MD;  Location: [facility];  Service: General;  Laterality: Left;   RIGHT DISTAL RADIUS FRACTURE     TOTAL KNEE ARTHROPLASTY     RIGHT   Patient Active Problem List   Diagnosis Date Noted   Stasis dermatitis of both legs 12/24/2018   Degenerative disc disease, lumbar 12/24/2018   Fibromyalgia 12/24/2018   Vitamin D deficiency 12/24/2018   Class 3 severe obesity with serious comorbidity and body mass index (BMI) of 40.0 to 44.9 in adult ([place]) 07/23/2017   Closed fracture of right inferior pubic ramus (HCC) 11/18/2016   Gastroesophageal reflux disease 06/06/2016   Recurrent major depressive disorder, in full remission ([place]) 06/06/2016   DM type 2 with diabetic  dyslipidemia ([place]) 03/06/2016   Edema 09/14/2013   Precordial pain 08/20/2011   Essential hypertension, benign    Hyperlipidemia     REFERRING PROVIDER: Boneta Lucks DPM  REFERRING DIAG: S/p right foot surgery.  THERAPY DIAG:  Pain in right ankle and joints of right foot  Localized edema  Stiffness of right ankle, not elsewhere classified  Rationale for Evaluation and Treatment Rehabilitation  ONSET DATE: Recent surgery (10/29/21).   SUBJECTIVE:   SUBJECTIVE STATEMENT: Reports that she feels her RT foot is doing some better with decreased pain and more motion  PERTINENT HISTORY: HTN, Fibromyalgia, left elbow surgery, right TKA, h/o left knee pain, h/o bilateral shoulder pain, removal of melanoma from left arm and face, statis dermatitis.  PAIN:  Are you having pain? Yes 4/10 R foot  PRECAUTIONS: Other: Currently in right CAM boot.  WEIGHT BEARING RESTRICTIONS  WBAT over right LE with CAM boot donned and use of Rollator.  OBJECTIVE: all objective measures were assessed at her initial evaluation on 12/11/21 unless otherwise noted  TODAY'S TREATMENT:  10/4 EXERCISE LOG  Exercise Repetitions and Resistance Comments  Rockerboard DF/PF x5 min, Inv/Ev x3 min   Static ankle inversion stretch X5 min   Dynadisc DF/PF x2 min   AROM ankle eversion Minimal range noted   Towel slides into eversion    Ankle eversion isometric X15 reps 3 sec holds   Resisted ankle eversion      Manual Therapy Passive ROM: R ankle, into eversion with overpressure for stretching .with AROM as well  Modalities Date:02/06/2022  Vaso: Ankle, 34 degrees, 10 mins, Pain and Edema  ASSESSMENT:  CLINICAL IMPRESSION: Patient presented in clinic with reports of less pain in RT ankle and will F/U with MD next week. She feels that it has improved some with less pain and more ROM , but ankle still sits in ankle inversion. Patient states that now her latest CAM boot is  starting to mold to ankle inversion as well. More stretching actively and passively completed to stretch ankle invertors and strengthen evertors. Normal vasopnuematic response end of session.  OBJECTIVE IMPAIRMENTS Abnormal gait, decreased activity tolerance, difficulty walking, decreased ROM, increased edema, and pain.   ACTIVITY LIMITATIONS standing and locomotion level  PARTICIPATION LIMITATIONS: meal prep, cleaning, and laundry  PERSONAL FACTORS Time since onset of injury/illness/exacerbation and 1 comorbidity: Prior right ankle surgery  are also affecting patient's functional outcome.   REHAB POTENTIAL: Fair    CLINICAL DECISION MAKING: Stable/uncomplicated  EVALUATION COMPLEXITY: Low   GOALS:  LONG TERM GOALS: Target date: 01/22/2022   Ind with HEP. Baseline:  Goal status: On-going  2.  Achieve right ankle neutral position. Baseline:  Goal status: On-going  3.  Walk 250 feet with right ankle braced and using Rollator with pain not > 4/10. Baseline:  Goal status: On-going     PLAN: PT FREQUENCY: 1-2x/week  PT DURATION: 4 weeks  PLANNED INTERVENTIONS: Therapeutic exercises, Therapeutic activity, Neuromuscular re-education, Balance training, Gait training, Patient/Family education, Self Care, Joint mobilization, and Manual therapy  PLAN FOR NEXT SESSION: Gentle range of motion, seated BAPS board, per MD okay to transition to Tri-Lock brace and transition to wbat.   Antawn Sison,CHRIS, PTA 02/14/2022, 1:11 PM

## 2022-02-18 DIAGNOSIS — K219 Gastro-esophageal reflux disease without esophagitis: Secondary | ICD-10-CM | POA: Diagnosis not present

## 2022-02-18 DIAGNOSIS — R030 Elevated blood-pressure reading, without diagnosis of hypertension: Secondary | ICD-10-CM | POA: Diagnosis not present

## 2022-02-22 ENCOUNTER — Ambulatory Visit: Payer: Medicare Other | Admitting: Podiatry

## 2022-02-22 DIAGNOSIS — M7671 Peroneal tendinitis, right leg: Secondary | ICD-10-CM | POA: Diagnosis not present

## 2022-02-22 DIAGNOSIS — Z9889 Other specified postprocedural states: Secondary | ICD-10-CM

## 2022-02-22 NOTE — Progress Notes (Unsigned)
Subjective:  Patient ID: Tiffany Brown, female    DOB: 01-17-1946,  MRN: [id number]  Chief Complaint  Patient presents with   Routine Post Op    Pt stated that she is still having some pain she stated that she can only walk in the boot the trilock brace was not helpful     DOS: 10/29/2021 Procedure: Right peroneal tendon repair with with posterior tibial tendon lengthening  76 y.o. female returns for post-op check.  Patient states she is doing okay.  Minimal pain mild erythema.  Bandages clean dry and intact.  Review of Systems: Negative except as noted in the HPI. Denies N/V/F/Ch.  Past Medical History:  Diagnosis Date   Abnormal glucose    Depression with anxiety    Essential hypertension, benign    Fibromyalgia    GERD (gastroesophageal reflux disease)    Lymphedema of both lower extremities    and stomach   Melanoma ([place])    Near syncope    Other and unspecified hyperlipidemia    Pre-diabetes     Current Outpatient Medications:    amLODipine (NORVASC) 5 MG tablet, Take 1 tablet (5 mg total) by mouth daily., Disp: 90 tablet, Rfl: 3   buPROPion (WELLBUTRIN XL) 150 MG 24 hr tablet, Take 1 tablet (150 mg total) by mouth daily., Disp: 90 tablet, Rfl: 3   doxycycline (VIBRA-TABS) 100 MG tablet, Take 1 tablet (100 mg total) by mouth 2 (two) times daily., Disp: 28 tablet, Rfl: 0   DULoxetine (CYMBALTA) 60 MG capsule, Take 2 capsules (120 mg total) by mouth daily. (Patient taking differently: Take 60 mg by mouth 2 (two) times daily.), Disp: 180 capsule, Rfl: 3   fluticasone (FLONASE) 50 MCG/ACT nasal spray, Place 2 sprays into the nose daily as needed for rhinitis., Disp: , Rfl:    furosemide (LASIX) 40 MG tablet, Take 1 tablet (40 mg total) by mouth 2 (two) times daily as needed. (Patient taking differently: Take 40 mg by mouth 2 (two) times daily as needed for fluid or edema.), Disp: 180 tablet, Rfl: 3   gabapentin (NEURONTIN) 300 MG capsule, TAKE 1 CAPSULE BY MOUTH THREE TIMES  A DAY, Disp: 90 capsule, Rfl: 3   gabapentin (NEURONTIN) 300 MG capsule, Take 1 capsule (300 mg total) by mouth 3 (three) times daily., Disp: 90 capsule, Rfl: 3   ibuprofen (ADVIL) 800 MG tablet, Take 1 tablet (800 mg total) by mouth every 6 (six) hours as needed. (Patient taking differently: Take 800 mg by mouth every 6 (six) hours as needed for mild pain or moderate pain.), Disp: 60 tablet, Rfl: 1   ibuprofen (ADVIL) 800 MG tablet, Take 1 tablet (800 mg total) by mouth every 6 (six) hours as needed., Disp: 60 tablet, Rfl: 1   losartan (COZAAR) 100 MG tablet, Take 1 tablet (100 mg total) by mouth daily., Disp: 90 tablet, Rfl: 3   omeprazole (PRILOSEC) 40 MG capsule, Take 40 mg by mouth daily before breakfast., Disp: , Rfl:    pravastatin (PRAVACHOL) 80 MG tablet, Take 1 tablet (80 mg total) by mouth daily., Disp: 90 tablet, Rfl: 3   Semaglutide, 1 MG/DOSE, 4 MG/3ML SOPN, Inject 1 mg as directed once a week., Disp: 9 mL, Rfl: 3  Social History   Tobacco Use  Smoking Status Former   Packs/day: 0.80   Years: 25.00   Total pack years: 20.00   Types: Cigarettes   Quit date: 05/06/1994   Years since quitting: 27.8  Smokeless Tobacco Never  No Active Allergies Objective:  There were no vitals filed for this visit. There is no height or weight on file to calculate BMI. Constitutional Well developed. Well nourished.  Vascular Foot warm and well perfused. Capillary refill normal to all digits.   Neurologic Normal speech. Oriented to person, place, and time. Epicritic sensation to light touch grossly present bilaterally.  Dermatologic Peroneal tendon/lateral compartment strength 3 out of 5 posterior tibial/medial compartment strength 4 out of 5.  She still has some cavovarus foot structure present  Orthopedic: Mild tenderness to palpation noted about the surgical site.   Radiographs: None Assessment:   No diagnosis found.   Plan:  Patient was evaluated and treated and all questions  answered.  S/p foot surgery right -Progressing as expected post-operatively. -XR: See above -WB Status: Weightbearing as tolerated in regular shoes with a Tri-Lock ankle brace -Sutures: Removed no clinical signs of Deis is noted.  No complication noted. -Medications: None -Continue physical therapy -S continue physical therapy aggressive therapy. -I will try to obtain another brace from [facility].  She was given his prescription for [facility] bracing.  No follow-ups on file.   [facility] for a custom brace.

## 2022-03-01 ENCOUNTER — Ambulatory Visit (INDEPENDENT_AMBULATORY_CARE_PROVIDER_SITE_OTHER): Payer: Medicare Other | Admitting: Pharmacist

## 2022-03-01 DIAGNOSIS — E1169 Type 2 diabetes mellitus with other specified complication: Secondary | ICD-10-CM

## 2022-03-01 DIAGNOSIS — E782 Mixed hyperlipidemia: Secondary | ICD-10-CM

## 2022-03-01 NOTE — Progress Notes (Signed)
Chronic Care Management Pharmacy Note  03/01/2022 Name:  Tiffany Brown MRN:  [id number] DOB:  03-15-1946  Summary: Diabetes: New goal. Controlled; current treatment:OZEMPIC 0.5MG-->INCREASE TO 1MG WEEKLY, BUMP IN A1C ALMOST 1 PT;  COPAY IS $086/PYPPJ APPLICATION SUBMITTED FOR NOVO [place] PATIENT ASSISTANCE (CAN TAKE UP TO 8 WEEKS TO ARRIVE) TOLERATING WELL/DENIES SIDE EFFECTS Denies personal and family history of Medullary thyroid cancer (MTC) Current glucose readings: fasting glucose: <135, post prandial glucose: N/A Denies hypoglycemic/hyperglycemic symptoms Discussed meal planning options and Plate method for healthy eating Avoid sugary drinks and desserts Incorporate balanced protein, non starchy veggies, 1 serving of carbohydrate with each meal Increase water intake Increase physical activity as able Current exercise: KNEE PROBLEMS/NEEDS REPLACEMENT; ALSO MENTIONS FOOT PROBLEMS  Hyperlipidemia LDL 68, currently controlled Continue current statin and dietary heart healthy recommendations Lipid Panel     Component Value Date/Time   CHOL 152 11/14/2021 1310   TRIG 203 (H) 11/14/2021 1310   HDL 51 11/14/2021 1310   CHOLHDL 3.0 11/14/2021 1310   LDLCALC 68 11/14/2021 1310   LABVLDL 33 11/14/2021 1310   Patient Goals/Self-Care Activities patient will:  - take medications as prescribed as evidenced by patient report and record review check glucose DAILY/FASTING OR SYMPTOMATIC, document, and provide at future appointments collaborate with provider on medication access solutions engage in dietary modifications by .FOLLOWING A HEART HEALTHY DIET/HEALTHY PLATE METHOD    Subjective: Tiffany Brown is an 76 y.o. year old female who is a primary patient of Dettinger, Fransisca Kaufmann, MD.  The CCM team was consulted for assistance with disease management and care coordination needs.    Engaged with patient by telephone for initial visit in response to provider referral for  pharmacy case management and/or care coordination services.   Consent to Services:  The patient was given information about Chronic Care Management services, agreed to services, and gave verbal consent prior to initiation of services.  Please see initial visit note for detailed documentation.   Patient Care Team: Dettinger, Fransisca Kaufmann, MD as PCP - General (Family Medicine) Regal, Tamala Fothergill, DPM as Consulting Physician (Podiatry) Felipa Furnace, DPM as Consulting Physician (Podiatry) Lavera Guise, [facility] as Pharmacist (Family Medicine)  Objective:  Lab Results  Component Value Date   CREATININE 0.64 11/14/2021   CREATININE 0.74 10/24/2021   CREATININE 0.58 04/26/2021    Lab Results  Component Value Date   HGBA1C 6.6 (H) 02/14/2022   Last diabetic Eye exam:  Lab Results  Component Value Date/Time   HMDIABEYEEXA No Retinopathy 02/17/2020 12:00 AM    Last diabetic Foot exam: No results found for: "HMDIABFOOTEX"      Component Value Date/Time   CHOL 152 11/14/2021 1310   TRIG 203 (H) 11/14/2021 1310   HDL 51 11/14/2021 1310   CHOLHDL 3.0 11/14/2021 1310   LDLCALC 68 11/14/2021 1310       Latest Ref Rng & Units 10/24/2021    1:55 PM 04/26/2021    2:07 PM 10/23/2020    8:58 AM  Hepatic Function  Total Protein 6.5 - 8.1 g/dL 6.8  6.2  6.4   Albumin 3.5 - 5.0 g/dL 3.7  4.3  4.0   AST 15 - 41 U/L _0 ALT 0 - 44 U/L _1 Alk Phosphatase 38 - 126 U/L 124  139  156   Total Bilirubin 0.3 - 1.2 mg/dL 0.3  0.3  0.4  Lab Results  Component Value Date/Time   TSH 4.210 10/14/2019 09:00 AM       Latest Ref Rng & Units 11/14/2021    1:10 PM 10/24/2021    1:55 PM 04/26/2021    2:07 PM  CBC  WBC 3.4 - 10.8 x10E3/uL 9.8  11.6  8.7   Hemoglobin 11.1 - 15.9 g/dL 12.5  13.7  11.8   Hematocrit 34.0 - 46.6 % 37.5  42.0  35.0   Platelets 150 - 450 x10E3/uL 219  195  211     No results found for: "VD25OH"  Clinical ASCVD: No  The 10-year ASCVD risk score  (Arnett DK, et al., 2019) is: 51.4%   Values used to calculate the score:     Age: 25 years     Sex: Female     Is Non-Hispanic African American: No     Diabetic: Yes     Tobacco smoker: No     Systolic Blood Pressure: 382 mmHg     Is BP treated: Yes     HDL Cholesterol: 51 mg/dL     Total Cholesterol: 152 mg/dL    Other: (CHADS2VASc if Afib, PHQ9 if depression, MMRC or CAT for COPD, ACT, DEXA)  Social History   Tobacco Use  Smoking Status Former   Packs/day: 0.80   Years: 25.00   Total pack years: 20.00   Types: Cigarettes   Quit date: 05/06/1994   Years since quitting: 27.8  Smokeless Tobacco Never   BP Readings from Last 3 Encounters:  02/14/22 (!) 144/64  11/14/21 136/72  10/24/21 (!) 121/53   Pulse Readings from Last 3 Encounters:  02/14/22 87  11/14/21 73  10/24/21 83   Wt Readings from Last 3 Encounters:  02/14/22 281 lb (127.5 kg)  11/14/21 271 lb (122.9 kg)  10/24/21 267 lb (121.1 kg)    Assessment: Review of patient past medical history, allergies, medications, health status, including review of consultants reports, laboratory and other test data, was performed as part of comprehensive evaluation and provision of chronic care management services.   SDOH:  (Social Determinants of Health) assessments and interventions performed:  SDOH Interventions    Flowsheet Row Office Visit from 02/14/2022 in [place] from 09/24/2021 in [place] Visit from 07/25/2021 in [place] from 09/20/2020 in [place] Visit from 03/31/2019 in [place] Interventions       Food Insecurity Interventions -- Intervention Not Indicated -- -- --  Housing Interventions -- Intervention Not Indicated -- -- --  Transportation Interventions -- Intervention Not Indicated -- -- --  Depression Interventions/Treatment   Currently on Treatment Currently on Treatment Currently on Treatment, Medication Currently on Treatment Currently on Treatment  Financial Strain Interventions -- Intervention Not Indicated -- -- --  Physical Activity Interventions -- Intervention Not Indicated -- -- --  Stress Interventions -- Intervention Not Indicated -- -- --  Social Connections Interventions -- Patient Refused -- -- --       [place]  No Active Allergies  Medications Reviewed Today     Reviewed by Lavera Guise, [facility] (Pharmacist) on 03/01/22 at [phone]  Med List Status: <None>   Medication Order Taking? Sig Documenting Provider Last Dose Status Informant  amLODipine (NORVASC) 5 MG tablet [id number] No Take 1 tablet (5 mg total) by mouth daily. Dettinger, Fransisca Kaufmann, MD Taking Active Self  buPROPion (WELLBUTRIN XL) 150  MG 24 hr tablet [id number] No Take 1 tablet (150 mg total) by mouth daily. Dettinger, Fransisca Kaufmann, MD Taking Active Self  doxycycline (VIBRA-TABS) 100 MG tablet [id number] No Take 1 tablet (100 mg total) by mouth 2 (two) times daily. Felipa Furnace, DPM Taking Active   DULoxetine (CYMBALTA) 60 MG capsule [id number] No Take 2 capsules (120 mg total) by mouth daily.  Patient taking differently: Take 60 mg by mouth 2 (two) times daily.   Dettinger, Fransisca Kaufmann, MD Taking Active Self  fluticasone (FLONASE) 50 MCG/ACT nasal spray [id number] No Place 2 sprays into the nose daily as needed for rhinitis. [provider] Taking Active Self  furosemide (LASIX) 40 MG tablet [id number] No Take 1 tablet (40 mg total) by mouth 2 (two) times daily as needed.  Patient taking differently: Take 40 mg by mouth 2 (two) times daily as needed for fluid or edema.   Dettinger, Fransisca Kaufmann, MD Taking Active Self  gabapentin (NEURONTIN) 300 MG capsule [id number] No TAKE 1 CAPSULE BY MOUTH THREE TIMES A DAY Lorenda Peck, DPM Taking Active Self  gabapentin (NEURONTIN) 300 MG capsule [id number] No Take 1 capsule (300 mg total) by mouth  3 (three) times daily. Felipa Furnace, DPM Taking Active   ibuprofen (ADVIL) 800 MG tablet [id number] No Take 1 tablet (800 mg total) by mouth every 6 (six) hours as needed.  Patient taking differently: Take 800 mg by mouth every 6 (six) hours as needed for mild pain or moderate pain.   Felipa Furnace, DPM Taking Active Self  ibuprofen (ADVIL) 800 MG tablet [id number] No Take 1 tablet (800 mg total) by mouth every 6 (six) hours as needed. Felipa Furnace, DPM Taking Active   losartan (COZAAR) 100 MG tablet [id number] No Take 1 tablet (100 mg total) by mouth daily. Dettinger, Fransisca Kaufmann, MD Taking Active Self  omeprazole (PRILOSEC) 40 MG capsule [id number] No Take 40 mg by mouth daily before breakfast. [provider] Taking Active Self  pravastatin (PRAVACHOL) 80 MG tablet [id number] No Take 1 tablet (80 mg total) by mouth daily. Dettinger, Fransisca Kaufmann, MD Taking Active Self  Semaglutide, 1 MG/DOSE, 4 MG/3ML SOPN [id number]  Inject 1 mg as directed once a week. Dettinger, Fransisca Kaufmann, MD  Active             Patient Active Problem List   Diagnosis Date Noted   Stasis dermatitis of both legs 12/24/2018   Degenerative disc disease, lumbar 12/24/2018   Fibromyalgia 12/24/2018   Vitamin D deficiency 12/24/2018   Class 3 severe obesity with serious comorbidity and body mass index (BMI) of 40.0 to 44.9 in adult ([place]) 07/23/2017   Closed fracture of right inferior pubic ramus ([place]) 11/18/2016   Gastroesophageal reflux disease 06/06/2016   Recurrent major depressive disorder, in full remission ([place]) 06/06/2016   DM type 2 with diabetic dyslipidemia ([place]) 03/06/2016   Edema 09/14/2013   Precordial pain 08/20/2011   Essential hypertension, benign    Hyperlipidemia     Immunization History  Administered Date(s) Administered   Fluad Quad(high Dose 65+) 01/24/2020   Influenza, High Dose Seasonal PF 03/06/2016, 01/28/2017, 02/12/2018, 02/25/2019   Influenza,inj,Quad PF,6+ Mos 01/25/2021    Influenza,trivalent, recombinat, inj, PF 02/10/2014, 02/07/2015   Influenza-Unspecified 02/10/2014, 02/07/2015   Moderna Sars-Covid-2 Vaccination 06/30/2019, 07/28/2019, 03/07/2020   Pneumococcal Conjugate-13 07/03/2015   Pneumococcal Polysaccharide-23 08/05/2016   Td,absorbed, Preservative Free, Adult Use, Lf Unspecified 11/16/2004   Tdap 02/10/2014, 05/03/2019   Zoster Recombinat (Shingrix) 10/25/2020,  07/25/2021    Conditions to be addressed/monitored: HLD and DMII  Care Plan : PHARMD MEDICATION MANAGEMENT  Updates made by Lavera Guise, RPH since 03/01/2022 12:00 AM     Problem: DISEASE PROGRESSION PREVENTION      Long-Range Goal: T2DM, HLD   Note:   Current Barriers:  Unable to independently afford treatment regimen Unable to maintain control of T2DM, HLD  Pharmacist Clinical Goal(s):  patient will verbalize ability to afford treatment regimen maintain control of T2DM, HLD as evidenced by A1C<7%, LDL<70  through collaboration with PharmD and provider.    Interventions: 1:1 collaboration with Dettinger, Fransisca Kaufmann, MD regarding development and update of comprehensive plan of care as evidenced by provider attestation and co-signature Inter-disciplinary care team collaboration (see longitudinal plan of care) Comprehensive medication review performed; medication list updated in electronic medical record  Diabetes: New goal. Controlled; current treatment:OZEMPIC 0.5MG-->INCREASE TO 1MG WEEKLY, BUMP IN A1C ALMOST 1 PT;  COPAY IS $360/OVPCH APPLICATION SUBMITTED FOR NOVO [place] PATIENT ASSISTANCE (CAN TAKE UP TO 8 WEEKS TO ARRIVE) TOLERATING WELL/DENIES SIDE EFFECTS Denies personal and family history of Medullary thyroid cancer (MTC) Current glucose readings: fasting glucose: <135, post prandial glucose: N/A Denies hypoglycemic/hyperglycemic symptoms Discussed meal planning options and Plate method for healthy eating Avoid sugary drinks and desserts Incorporate balanced  protein, non starchy veggies, 1 serving of carbohydrate with each meal Increase water intake Increase physical activity as able Current exercise: KNEE PROBLEMS/NEEDS REPLACEMENT; ALSO MENTIONS FOOT PROBLEMS  Hyperlipidemia LDL 68, currently controlled Continue current statin and dietary heart healthy recommendations Lipid Panel     Component Value Date/Time   CHOL 152 11/14/2021 1310   TRIG 203 (H) 11/14/2021 1310   HDL 51 11/14/2021 1310   CHOLHDL 3.0 11/14/2021 1310   LDLCALC 68 11/14/2021 1310   LABVLDL 33 11/14/2021 1310    Patient Goals/Self-Care Activities patient will:  - take medications as prescribed as evidenced by patient report and record review check glucose DAILY/FASTING OR SYMPTOMATIC, document, and provide at future appointments collaborate with provider on medication access solutions engage in dietary modifications by . FOLLOWING A HEART HEALTHY DIET/HEALTHY PLATE METHOD       Medication Assistance: Application for ozempic  medication assistance program. in process.  Anticipated assistance start date tbd.  See plan of care for additional detail.  Patient's preferred pharmacy is:  [facility], [place] [address] Phone: [phone] Fax: [phone]  [id number] [id number]: [phone]: [phone]  Plan: Telephone follow up appointment with care management team member scheduled for:  3-6 months   JRegina Eck PharmD, BCPS Clinical Pharmacist, [place] Phone [phone]

## 2022-03-01 NOTE — Patient Instructions (Signed)
Visit Information  Following are the goals we discussed today:  Summary: Diabetes: New goal. Controlled; current treatment:OZEMPIC 0.'5MG'$ -->INCREASE TO '1MG'$  WEEKLY, BUMP IN A1C ALMOST 1 PT;  COPAY IS $741/OINOM APPLICATION SUBMITTED FOR NOVO [place]ISK PATIENT ASSISTANCE (CAN TAKE UP TO 8 WEEKS TO ARRIVE) TOLERATING WELL/DENIES SIDE EFFECTS Denies personal and family history of Medullary thyroid cancer (MTC) Current glucose readings: fasting glucose: <135, post prandial glucose: N/A Denies hypoglycemic/hyperglycemic symptoms Discussed meal planning options and Plate method for healthy eating Avoid sugary drinks and desserts Incorporate balanced protein, non starchy veggies, 1 serving of carbohydrate with each meal Increase water intake Increase physical activity as able Current exercise: KNEE PROBLEMS/NEEDS REPLACEMENT; ALSO MENTIONS FOOT PROBLEMS  Hyperlipidemia LDL 68, currently controlled Continue current statin and dietary heart healthy recommendations Lipid Panel     Component Value Date/Time   CHOL 152 11/14/2021 1310   TRIG 203 (H) 11/14/2021 1310   HDL 51 11/14/2021 1310   CHOLHDL 3.0 11/14/2021 1310   LDLCALC 68 11/14/2021 1310   LABVLDL 33 11/14/2021 1310   Patient Goals/Self-Care Activities patient will:  - take medications as prescribed as evidenced by patient report and record review check glucose DAILY/FASTING OR SYMPTOMATIC, document, and provide at future appointments collaborate with provider on medication access solutions engage in dietary modifications by .FOLLOWING A HEART HEALTHY DIET/HEALTHY PLATE METHOD  Plan: Telephone follow up appointment with care management team member scheduled for:  3-6 months  Signature Regina Eck, PharmD, BCPS Clinical Pharmacist, [place]  [facility] Phone [phone]   Please call the care guide team at [phone] if you need to cancel or reschedule your appointment.   The patient  verbalized understanding of instructions, educational materials, and care plan provided today and DECLINED offer to receive copy of patient instructions, educational materials, and care plan.

## 2022-03-04 DIAGNOSIS — K219 Gastro-esophageal reflux disease without esophagitis: Secondary | ICD-10-CM | POA: Diagnosis not present

## 2022-03-04 DIAGNOSIS — R7303 Prediabetes: Secondary | ICD-10-CM | POA: Diagnosis not present

## 2022-03-05 DIAGNOSIS — E785 Hyperlipidemia, unspecified: Secondary | ICD-10-CM

## 2022-03-05 DIAGNOSIS — E1169 Type 2 diabetes mellitus with other specified complication: Secondary | ICD-10-CM

## 2022-03-05 DIAGNOSIS — Z7985 Long-term (current) use of injectable non-insulin antidiabetic drugs: Secondary | ICD-10-CM

## 2022-03-21 ENCOUNTER — Telehealth: Payer: Self-pay | Admitting: Family Medicine

## 2022-03-21 DIAGNOSIS — N3281 Overactive bladder: Secondary | ICD-10-CM

## 2022-03-21 DIAGNOSIS — N3941 Urge incontinence: Secondary | ICD-10-CM

## 2022-03-22 ENCOUNTER — Ambulatory Visit: Payer: Medicare Other | Admitting: Podiatry

## 2022-03-22 MED ORDER — MIRABEGRON ER 25 MG PO TB24: 25.0000 mg | ORAL_TABLET | Freq: Every day | ORAL | 5 refills | Status: DC

## 2022-03-22 NOTE — Telephone Encounter (Signed)
Pt made aware and will call back if medication is too expensive.

## 2022-03-22 NOTE — Telephone Encounter (Signed)
Sent Myrbetriq in for her, she may have a new prescription assistance For this medicine

## 2022-03-27 DIAGNOSIS — M24571 Contracture, right ankle: Secondary | ICD-10-CM | POA: Diagnosis not present

## 2022-03-27 DIAGNOSIS — R2689 Other abnormalities of gait and mobility: Secondary | ICD-10-CM | POA: Diagnosis not present

## 2022-04-02 DIAGNOSIS — R7303 Prediabetes: Secondary | ICD-10-CM | POA: Diagnosis not present

## 2022-04-02 DIAGNOSIS — K5903 Drug induced constipation: Secondary | ICD-10-CM | POA: Diagnosis not present

## 2022-04-05 ENCOUNTER — Ambulatory Visit: Payer: Medicare Other | Admitting: Podiatry

## 2022-04-12 NOTE — Progress Notes (Signed)
[facility] Quality Team Note  Name: Tiffany Brown Date of Birth: 05/08/1945 MRN: [id number] Date: 04/12/2022  [facility] Quality Team has reviewed this patient's chart, please see recommendations below:  [facility] Quality Other; (KED GAP- KIDNEY HEALTH EVALUATION- PATIENT NEEDS URINE MICROALBUMIN/CREATININE RATIO TEST COMPLETED BEFORE END OF YEAR FOR GAP CLOSURE. TEST HAS BEEN ORDERED BUT NOT YET COMPLETED. )

## 2022-04-26 ENCOUNTER — Ambulatory Visit: Payer: Medicare Other | Admitting: Podiatry

## 2022-04-26 DIAGNOSIS — M7671 Peroneal tendinitis, right leg: Secondary | ICD-10-CM | POA: Diagnosis not present

## 2022-04-26 NOTE — Progress Notes (Signed)
Subjective:  Patient ID: Tiffany Brown, female    DOB: 07-Aug-1945,  MRN: [id number]  Chief Complaint  Patient presents with   Routine Post Op    DOS: 10/29/2021 Procedure: Right peroneal tendon repair with with posterior tibial tendon lengthening  76 y.o. female returns for post-op check.  Patient states she is doing okay.  She states the ice helps but she is not able to come out of the brace.  Denies any other acute complaints.  Review of Systems: Negative except as noted in the HPI. Denies N/V/F/Ch.  Past Medical History:  Diagnosis Date   Abnormal glucose    Depression with anxiety    Essential hypertension, benign    Fibromyalgia    GERD (gastroesophageal reflux disease)    Lymphedema of both lower extremities    and stomach   Melanoma ([place])    Near syncope    Other and unspecified hyperlipidemia    Pre-diabetes     Current Outpatient Medications:    amLODipine (NORVASC) 5 MG tablet, Take 1 tablet (5 mg total) by mouth daily., Disp: 90 tablet, Rfl: 3   buPROPion (WELLBUTRIN XL) 150 MG 24 hr tablet, Take 1 tablet (150 mg total) by mouth daily., Disp: 90 tablet, Rfl: 3   doxycycline (VIBRA-TABS) 100 MG tablet, Take 1 tablet (100 mg total) by mouth 2 (two) times daily., Disp: 28 tablet, Rfl: 0   DULoxetine (CYMBALTA) 60 MG capsule, Take 2 capsules (120 mg total) by mouth daily. (Patient taking differently: Take 60 mg by mouth 2 (two) times daily.), Disp: 180 capsule, Rfl: 3   fluticasone (FLONASE) 50 MCG/ACT nasal spray, Place 2 sprays into the nose daily as needed for rhinitis., Disp: , Rfl:    furosemide (LASIX) 40 MG tablet, Take 1 tablet (40 mg total) by mouth 2 (two) times daily as needed. (Patient taking differently: Take 40 mg by mouth 2 (two) times daily as needed for fluid or edema.), Disp: 180 tablet, Rfl: 3   gabapentin (NEURONTIN) 300 MG capsule, TAKE 1 CAPSULE BY MOUTH THREE TIMES A DAY, Disp: 90 capsule, Rfl: 3   gabapentin (NEURONTIN) 300 MG capsule, Take 1  capsule (300 mg total) by mouth 3 (three) times daily., Disp: 90 capsule, Rfl: 3   ibuprofen (ADVIL) 800 MG tablet, Take 1 tablet (800 mg total) by mouth every 6 (six) hours as needed. (Patient taking differently: Take 800 mg by mouth every 6 (six) hours as needed for mild pain or moderate pain.), Disp: 60 tablet, Rfl: 1   ibuprofen (ADVIL) 800 MG tablet, Take 1 tablet (800 mg total) by mouth every 6 (six) hours as needed., Disp: 60 tablet, Rfl: 1   losartan (COZAAR) 100 MG tablet, Take 1 tablet (100 mg total) by mouth daily., Disp: 90 tablet, Rfl: 3   mirabegron ER (MYRBETRIQ) 25 MG TB24 tablet, Take 1 tablet (25 mg total) by mouth daily., Disp: 30 tablet, Rfl: 5   omeprazole (PRILOSEC) 40 MG capsule, Take 40 mg by mouth daily before breakfast., Disp: , Rfl:    pravastatin (PRAVACHOL) 80 MG tablet, Take 1 tablet (80 mg total) by mouth daily., Disp: 90 tablet, Rfl: 3   Semaglutide, 1 MG/DOSE, 4 MG/3ML SOPN, Inject 1 mg as directed once a week., Disp: 9 mL, Rfl: 3  Social History   Tobacco Use  Smoking Status Former   Packs/day: 0.80   Years: 25.00   Total pack years: 20.00   Types: Cigarettes   Quit date: 05/06/1994   Years since  quitting: 28.0  Smokeless Tobacco Never    No Active Allergies Objective:  There were no vitals filed for this visit. There is no height or weight on file to calculate BMI. Constitutional Well developed. Well nourished.  Vascular Foot warm and well perfused. Capillary refill normal to all digits.   Neurologic Normal speech. Oriented to person, place, and time. Epicritic sensation to light touch grossly present bilaterally.  Dermatologic Peroneal tendon/lateral compartment strength 3 out of 5 posterior tibial/medial compartment strength 4 out of 5.  She still has some cavovarus foot structure present.  The current brace is giving her stability while ambulating  Orthopedic: Mild tenderness to palpation noted about the surgical site.   Radiographs:  None Assessment:   No diagnosis found.   Plan:  Patient was evaluated and treated and all questions answered.  S/p foot surgery right -Clinically she is able to wear the brace that was made by [facility] to give her stability.  I encouraged her that she will need to wear the brace for the rest of her life to give her stability while she is walking.  At this time patient states understanding and if any foot and ankle issues arise in the future she will come back and see me.  She is officially discharged from my care  No follow-ups on file.

## 2022-05-14 ENCOUNTER — Ambulatory Visit (INDEPENDENT_AMBULATORY_CARE_PROVIDER_SITE_OTHER): Payer: Medicare Other | Admitting: Pharmacist

## 2022-05-14 VITALS — BP 134/80

## 2022-05-14 DIAGNOSIS — E1169 Type 2 diabetes mellitus with other specified complication: Secondary | ICD-10-CM

## 2022-05-14 DIAGNOSIS — I10 Essential (primary) hypertension: Secondary | ICD-10-CM

## 2022-05-17 ENCOUNTER — Encounter: Payer: Self-pay | Admitting: Family Medicine

## 2022-05-17 ENCOUNTER — Other Ambulatory Visit: Payer: Self-pay

## 2022-05-17 ENCOUNTER — Telehealth: Payer: Self-pay | Admitting: Family Medicine

## 2022-05-17 ENCOUNTER — Ambulatory Visit (INDEPENDENT_AMBULATORY_CARE_PROVIDER_SITE_OTHER): Payer: Medicare Other | Admitting: Family Medicine

## 2022-05-17 VITALS — BP 145/78 | HR 85 | Temp 97.6°F | Ht 65.0 in | Wt 283.0 lb

## 2022-05-17 DIAGNOSIS — E785 Hyperlipidemia, unspecified: Secondary | ICD-10-CM | POA: Diagnosis not present

## 2022-05-17 DIAGNOSIS — F3342 Major depressive disorder, recurrent, in full remission: Secondary | ICD-10-CM | POA: Diagnosis not present

## 2022-05-17 DIAGNOSIS — M797 Fibromyalgia: Secondary | ICD-10-CM | POA: Diagnosis not present

## 2022-05-17 DIAGNOSIS — I10 Essential (primary) hypertension: Secondary | ICD-10-CM | POA: Diagnosis not present

## 2022-05-17 DIAGNOSIS — E1169 Type 2 diabetes mellitus with other specified complication: Secondary | ICD-10-CM

## 2022-05-17 DIAGNOSIS — E782 Mixed hyperlipidemia: Secondary | ICD-10-CM | POA: Diagnosis not present

## 2022-05-17 LAB — BAYER DCA HB A1C WAIVED: HB A1C (BAYER DCA - WAIVED): 6.5 % — ABNORMAL HIGH (ref 4.8–5.6)

## 2022-05-17 MED ORDER — FLUTICASONE PROPIONATE 50 MCG/ACT NA SUSP: 2.0000 | Freq: Every day | NASAL | 5 refills | Status: DC | PRN

## 2022-05-17 MED ORDER — SEMAGLUTIDE (1 MG/DOSE) 4 MG/3ML ~~LOC~~ SOPN: 1.0000 mg | PEN_INJECTOR | SUBCUTANEOUS | 3 refills | Status: DC

## 2022-05-17 MED ORDER — FUROSEMIDE 40 MG PO TABS: 40.0000 mg | ORAL_TABLET | Freq: Two times a day (BID) | ORAL | 3 refills | Status: DC | PRN

## 2022-05-17 MED ORDER — DULOXETINE HCL 60 MG PO CPEP: 120.0000 mg | ORAL_CAPSULE | Freq: Every day | ORAL | 3 refills | Status: DC

## 2022-05-17 MED ORDER — AMLODIPINE BESYLATE 5 MG PO TABS: 5.0000 mg | ORAL_TABLET | Freq: Every day | ORAL | 3 refills | Status: DC

## 2022-05-17 MED ORDER — LOSARTAN POTASSIUM 100 MG PO TABS: 100.0000 mg | ORAL_TABLET | Freq: Every day | ORAL | 3 refills | Status: DC

## 2022-05-17 MED ORDER — SOLIFENACIN SUCCINATE 10 MG PO TABS: 10.0000 mg | ORAL_TABLET | Freq: Every day | ORAL | 1 refills | Status: DC

## 2022-05-17 MED ORDER — PRAVASTATIN SODIUM 80 MG PO TABS: 80.0000 mg | ORAL_TABLET | Freq: Every day | ORAL | 3 refills | Status: DC

## 2022-05-17 NOTE — Telephone Encounter (Signed)
Yes I forgot to send that in, I sent in [place] for her now.

## 2022-05-17 NOTE — Telephone Encounter (Signed)
Pt informed and has no further concerns.

## 2022-05-17 NOTE — Telephone Encounter (Signed)
Pt had a visit with Dr Dettinger today. Says he was supposed to send in a Rx for her bladder but pharmacy says they didn't get any medicine for it. Pt says Dr Dettinger must have forgot. Wants Dr Dettinger to be reminded of this so he can send Rx in.

## 2022-05-17 NOTE — Progress Notes (Signed)
BP (!) 145/78   Pulse 85   Temp 97.6 F (36.4 C)   Ht '5\' 5"'$  (1.651 m)   Wt 283 lb (128.4 kg)   SpO2 99%   BMI 47.09 kg/m    Subjective:   Patient ID: Tiffany Brown, female    DOB: April 10, 1946, 77 y.o.   MRN: [id number]  HPI: Tiffany Brown is a 77 y.o. female presenting on 05/17/2022 for Medical Management of Chronic Issues, Hypertension, and Diabetes   HPI Type 2 diabetes mellitus Patient comes in today for recheck of his diabetes. Patient has been currently taking Ozempic although she just recently got back on the 0.5. Patient is currently on an ACE inhibitor/ARB. Patient has not seen an ophthalmologist this year. Patient denies any issues with their feet. The symptom started onset as an adult hypertension and hyperlipidemia ARE RELATED TO DM   Hypertension Patient is currently on amlodipine and furosemide and losartan, and their blood pressure today is 145/78. Patient denies any lightheadedness or dizziness. Patient denies headaches, blurred vision, chest pains, shortness of breath, or weakness. Denies any side effects from medication and is content with current medication. . Hyperlipidemia Patient is coming in for recheck of his hyperlipidemia. The patient is currently taking pravastatin. They deny any issues with myalgias or history of liver damage from it. They deny any focal numbness or weakness or chest pain.   Depression recheck Patient is coming in for depression recheck.  She is currently on Wellbutrin.  She says she is depressed but more has to do with her weight and she wants to help get her weight down.  We discussed options for getting weight down including getting her back on the Ozempic consistently and also getting into water aerobics.  Relevant past medical, surgical, family and social history reviewed and updated as indicated. Interim medical history since our last visit reviewed. Allergies and medications reviewed and updated.  Review of Systems   Constitutional:  Negative for chills and fever.  HENT:  Positive for congestion. Negative for ear discharge and ear pain.   Eyes:  Negative for redness and visual disturbance.  Respiratory:  Positive for cough. Negative for chest tightness and shortness of breath.   Cardiovascular:  Negative for chest pain and leg swelling.  Musculoskeletal:  Negative for back pain and gait problem.  Skin:  Negative for rash.  Neurological:  Negative for light-headedness and headaches.  Psychiatric/Behavioral:  Negative for agitation and behavioral problems.   All other systems reviewed and are negative.   Per HPI unless specifically indicated above   Allergies as of 05/17/2022   No Known Allergies      Medication List        Accurate as of May 17, 2022  1:53 PM. If you have any questions, ask your nurse or doctor.          amLODipine 5 MG tablet Commonly known as: NORVASC Take 1 tablet (5 mg total) by mouth daily.   buPROPion 150 MG 24 hr tablet Commonly known as: Wellbutrin XL Take 1 tablet (150 mg total) by mouth daily.   doxycycline 100 MG tablet Commonly known as: VIBRA-TABS Take 1 tablet (100 mg total) by mouth 2 (two) times daily.   DULoxetine 60 MG capsule Commonly known as: CYMBALTA Take 2 capsules (120 mg total) by mouth daily. What changed:  how much to take when to take this   fluticasone 50 MCG/ACT nasal spray Commonly known as: FLONASE Place 2 sprays into both  nostrils daily as needed for rhinitis. What changed: how to take this Changed by: Worthy Rancher, MD   furosemide 40 MG tablet Commonly known as: LASIX Take 1 tablet (40 mg total) by mouth 2 (two) times daily as needed. What changed: reasons to take this   gabapentin 300 MG capsule Commonly known as: NEURONTIN TAKE 1 CAPSULE BY MOUTH THREE TIMES A DAY   gabapentin 300 MG capsule Commonly known as: NEURONTIN Take 1 capsule (300 mg total) by mouth 3 (three) times daily.   ibuprofen 800 MG  tablet Commonly known as: ADVIL Take 1 tablet (800 mg total) by mouth every 6 (six) hours as needed. What changed: reasons to take this   ibuprofen 800 MG tablet Commonly known as: ADVIL Take 1 tablet (800 mg total) by mouth every 6 (six) hours as needed. What changed: Another medication with the same name was changed. Make sure you understand how and when to take each.   losartan 100 MG tablet Commonly known as: COZAAR Take 1 tablet (100 mg total) by mouth daily.   mirabegron ER 25 MG Tb24 tablet Commonly known as: Myrbetriq Take 1 tablet (25 mg total) by mouth daily.   omeprazole 40 MG capsule Commonly known as: PRILOSEC Take 40 mg by mouth daily before breakfast.   pravastatin 80 MG tablet Commonly known as: PRAVACHOL Take 1 tablet (80 mg total) by mouth daily.   Semaglutide (1 MG/DOSE) 4 MG/3ML Sopn Inject 1 mg as directed once a week.         Objective:   BP (!) 145/78   Pulse 85   Temp 97.6 F (36.4 C)   Ht '5\' 5"'$  (1.651 m)   Wt 283 lb (128.4 kg)   SpO2 99%   BMI 47.09 kg/m   Wt Readings from Last 3 Encounters:  05/17/22 283 lb (128.4 kg)  02/14/22 281 lb (127.5 kg)  11/14/21 271 lb (122.9 kg)    Physical Exam Vitals and nursing note reviewed.  Constitutional:      General: She is not in acute distress.    Appearance: She is well-developed. She is not diaphoretic.  Eyes:     Conjunctiva/sclera: Conjunctivae normal.  Cardiovascular:     Rate and Rhythm: Normal rate and regular rhythm.     Heart sounds: Normal heart sounds. No murmur heard. Pulmonary:     Effort: Pulmonary effort is normal. No respiratory distress.     Breath sounds: Normal breath sounds. No wheezing.  Musculoskeletal:        General: No swelling or tenderness. Normal range of motion.  Skin:    General: Skin is warm and dry.     Findings: No rash.  Neurological:     Mental Status: She is alert and oriented to person, place, and time.     Coordination: Coordination normal.   Psychiatric:        Behavior: Behavior normal.       Assessment & Plan:   Problem List Items Addressed This Visit       Cardiovascular and Mediastinum   Essential hypertension, benign   Relevant Medications   amLODipine (NORVASC) 5 MG tablet   furosemide (LASIX) 40 MG tablet   losartan (COZAAR) 100 MG tablet   pravastatin (PRAVACHOL) 80 MG tablet   Other Relevant Orders   CBC with Differential/Platelet   CMP14+EGFR   Lipid panel   Bayer DCA Hb A1c Waived     Endocrine   DM type 2 with diabetic dyslipidemia ([place]) -  Primary   Relevant Medications   losartan (COZAAR) 100 MG tablet   pravastatin (PRAVACHOL) 80 MG tablet   Semaglutide, 1 MG/DOSE, 4 MG/3ML SOPN   Other Relevant Orders   CBC with Differential/Platelet   CMP14+EGFR   Lipid panel   Bayer DCA Hb A1c Waived     Other   Hyperlipidemia   Relevant Medications   amLODipine (NORVASC) 5 MG tablet   furosemide (LASIX) 40 MG tablet   losartan (COZAAR) 100 MG tablet   pravastatin (PRAVACHOL) 80 MG tablet   Recurrent major depressive disorder, in full remission (HCC)   Relevant Medications   DULoxetine (CYMBALTA) 60 MG capsule   Fibromyalgia   Relevant Medications   DULoxetine (CYMBALTA) 60 MG capsule    Blood pressure slightly elevated but allowing somewhat permissive hypertension and she will keep a close eye on it at home.  She has not been checking it at home recently.  A1c is 6.5, no changes medication wise. Follow up plan: Return in about 3 months (around 08/16/2022), or if symptoms worsen or fail to improve, for Hypertension and diabetes and cholesterol.  Counseling provided for all of the vaccine components Orders Placed This Encounter  Procedures   CBC with Differential/Platelet   CMP14+EGFR   Lipid panel   Bayer DCA Hb A1c [place], MD [facility] 05/17/2022, 1:53 PM

## 2022-05-18 LAB — CMP14+EGFR
ALT: 22 IU/L (ref 0–32)
AST: 23 IU/L (ref 0–40)
Albumin/Globulin Ratio: 2 (ref 1.2–2.2)
Albumin: 4.4 g/dL (ref 3.8–4.8)
Alkaline Phosphatase: 149 IU/L — ABNORMAL HIGH (ref 44–121)
BUN/Creatinine Ratio: 29 — ABNORMAL HIGH (ref 12–28)
BUN: 20 mg/dL (ref 8–27)
Bilirubin Total: 0.3 mg/dL (ref 0.0–1.2)
CO2: 25 mmol/L (ref 20–29)
Calcium: 9.4 mg/dL (ref 8.7–10.3)
Chloride: 101 mmol/L (ref 96–106)
Creatinine, Ser: 0.68 mg/dL (ref 0.57–1.00)
Globulin, Total: 2.2 g/dL (ref 1.5–4.5)
Glucose: 149 mg/dL — ABNORMAL HIGH (ref 70–99)
Potassium: 4.1 mmol/L (ref 3.5–5.2)
Sodium: 142 mmol/L (ref 134–144)
Total Protein: 6.6 g/dL (ref 6.0–8.5)
eGFR: 90 mL/min/{1.73_m2} (ref >59)

## 2022-05-18 LAB — CBC WITH DIFFERENTIAL/PLATELET
Basophils Absolute: 0 10*3/uL (ref 0.0–0.2)
Basos: 0 %
EOS (ABSOLUTE): 0.3 10*3/uL (ref 0.0–0.4)
Eos: 3 %
Hematocrit: 38.8 % (ref 34.0–46.6)
Hemoglobin: 13 g/dL (ref 11.1–15.9)
Immature Grans (Abs): 0.1 10*3/uL (ref 0.0–0.1)
Immature Granulocytes: 1 %
Lymphocytes Absolute: 3 10*3/uL (ref 0.7–3.1)
Lymphs: 29 %
MCH: 31.2 pg (ref 26.6–33.0)
MCHC: 33.5 g/dL (ref 31.5–35.7)
MCV: 93 fL (ref 79–97)
Monocytes Absolute: 0.7 10*3/uL (ref 0.1–0.9)
Monocytes: 7 %
Neutrophils Absolute: 6.2 10*3/uL (ref 1.4–7.0)
Neutrophils: 60 %
Platelets: 183 10*3/uL (ref 150–450)
RBC: 4.17 x10E6/uL (ref 3.77–5.28)
RDW: 13.2 % (ref 11.7–15.4)
WBC: 10.3 10*3/uL (ref 3.4–10.8)

## 2022-05-18 LAB — LIPID PANEL
Chol/HDL Ratio: 3.2 ratio (ref 0.0–4.4)
Cholesterol, Total: 156 mg/dL (ref 100–199)
HDL: 49 mg/dL (ref >39)
LDL Chol Calc (NIH): 74 mg/dL (ref 0–99)
Triglycerides: 195 mg/dL — ABNORMAL HIGH (ref 0–149)
VLDL Cholesterol Cal: 33 mg/dL (ref 5–40)

## 2022-05-23 DIAGNOSIS — D225 Melanocytic nevi of trunk: Secondary | ICD-10-CM | POA: Diagnosis not present

## 2022-05-23 DIAGNOSIS — Z08 Encounter for follow-up examination after completed treatment for malignant neoplasm: Secondary | ICD-10-CM | POA: Diagnosis not present

## 2022-05-23 DIAGNOSIS — Z85820 Personal history of malignant melanoma of skin: Secondary | ICD-10-CM | POA: Diagnosis not present

## 2022-05-23 DIAGNOSIS — L578 Other skin changes due to chronic exposure to nonionizing radiation: Secondary | ICD-10-CM | POA: Diagnosis not present

## 2022-05-23 DIAGNOSIS — L910 Hypertrophic scar: Secondary | ICD-10-CM | POA: Diagnosis not present

## 2022-05-23 DIAGNOSIS — L821 Other seborrheic keratosis: Secondary | ICD-10-CM | POA: Diagnosis not present

## 2022-05-23 DIAGNOSIS — L814 Other melanin hyperpigmentation: Secondary | ICD-10-CM | POA: Diagnosis not present

## 2022-05-23 DIAGNOSIS — L570 Actinic keratosis: Secondary | ICD-10-CM | POA: Diagnosis not present

## 2022-05-26 ENCOUNTER — Other Ambulatory Visit: Payer: Self-pay | Admitting: Podiatry

## 2022-05-28 ENCOUNTER — Telehealth: Payer: Self-pay | Admitting: Podiatry

## 2022-05-28 MED ORDER — GABAPENTIN 100 MG PO CAPS: 100.0000 mg | ORAL_CAPSULE | Freq: Three times a day (TID) | ORAL | 3 refills | Status: DC

## 2022-05-28 MED ORDER — GABAPENTIN 300 MG PO CAPS: 300.0000 mg | ORAL_CAPSULE | Freq: Three times a day (TID) | ORAL | 3 refills | Status: DC

## 2022-05-28 NOTE — Telephone Encounter (Signed)
Pt requested a Rx refill on her Gabapentin. Please advise.

## 2022-05-29 ENCOUNTER — Other Ambulatory Visit: Payer: Self-pay

## 2022-05-29 MED ORDER — GABAPENTIN 300 MG PO CAPS: 300.0000 mg | ORAL_CAPSULE | Freq: Three times a day (TID) | ORAL | 3 refills | Status: DC

## 2022-06-04 ENCOUNTER — Encounter: Payer: Self-pay | Admitting: Pharmacist

## 2022-06-04 NOTE — Progress Notes (Signed)
Chronic Care Management Pharmacy Note  05/14/2022 Name:  Tiffany Brown MRN:  [id number] DOB:  August 29, 1945  Summary:   Diabetes: Goal on Track (progressing): YES. Controlled; current treatment:OZEMPIC INCREASE TO '1MG'$  WEEKLY APPLICATION APPROVED FOR NOVO Tiffany Brown (approved-patient received #3boxes this month) TOLERATING WELL/DENIES SIDE EFFECTS Denies personal and family history of Medullary thyroid cancer (MTC) Current glucose readings: fasting glucose: <135, post prandial glucose: N/A Denies hypoglycemic/hyperglycemic symptoms Discussed meal planning options and Plate method for healthy eating Avoid sugary drinks and desserts Incorporate balanced protein, non starchy veggies, 1 serving of carbohydrate with each meal Increase water intake Increase physical activity as able Current exercise: KNEE PROBLEMS/NEEDS REPLACEMENT; ALSO MENTIONS FOOT PROBLEMS  Hyperlipidemia LDL 68, currently controlled Continue current statin and dietary heart healthy recommendations Lipid Panel     Component Value Date/Time   CHOL 152 11/14/2021 1310   TRIG 203 (H) 11/14/2021 1310   HDL 51 11/14/2021 1310   CHOLHDL 3.0 11/14/2021 1310   LDLCALC 68 11/14/2021 1310   LABVLDL 33 11/14/2021 1310   Hypertension: BP<130s/80s per patient home report Compliant with medication Instructed patient to keep a check on this and report back if BP increases again Will continue to follow  Patient Goals/Self-Care Activities patient will:  - take medications as prescribed as evidenced by patient report and record review check glucose DAILY/FASTING OR SYMPTOMATIC, document, and provide at future appointments collaborate with provider on medication access solutions engage in dietary modifications by .FOLLOWING A HEART HEALTHY DIET/HEALTHY PLATE METHOD    Subjective: Tiffany Brown is an 77 y.o. year old female who is a primary patient of Dettinger, Fransisca Kaufmann, MD.  The patient was referred  to the Chronic Care Management team for Brown with care management needs subsequent to provider initiation of CCM services and plan of care.    Engaged with patient by telephone for follow up visit in response to provider referral for CCM services.   Objective:  LABS:    Lab Results  Component Value Date   CREATININE 0.68 05/17/2022   CREATININE 0.64 11/14/2021   CREATININE 0.74 10/24/2021     Lab Results  Component Value Date   HGBA1C 6.5 (H) 05/17/2022         Component Value Date/Time   CHOL 156 05/17/2022 1324   TRIG 195 (H) 05/17/2022 1324   HDL 49 05/17/2022 1324   CHOLHDL 3.2 05/17/2022 1324   LDLCALC 74 05/17/2022 1324     Clinical ASCVD: No   The 10-year ASCVD risk score (Arnett DK, et al., 2019) is: 46.2%   Values used to calculate the score:     Age: 24 years     Sex: Female     Is Non-Hispanic African American: No     Diabetic: Yes     Tobacco smoker: No     Systolic Blood Pressure: 732 mmHg     Is BP treated: Yes     HDL Cholesterol: 49 mg/dL     Total Cholesterol: 156 mg/dL    Other: (CHADS2VASc if Afib, PHQ9 if depression, MMRC or CAT for COPD, ACT, DEXA)    BP Readings from Last 3 Encounters:  05/17/22 (!) 145/78  06/04/22 134/80  02/14/22 (!) 144/64      SDOH:  (Social Determinants of Health) assessments and interventions performed:    No Known Allergies  Medications Reviewed Today     Reviewed by Lavera Guise, RPH (Pharmacist) on 06/04/22 at [place] List Status: <None>  Medication Order Taking? Sig Documenting Provider Last Dose Status Informant  amLODipine (NORVASC) 5 MG tablet [id number]  Take 1 tablet (5 mg total) by mouth daily. Dettinger, Fransisca Kaufmann, MD  Active   buPROPion (WELLBUTRIN XL) 150 MG 24 hr tablet [id number] No Take 1 tablet (150 mg total) by mouth daily. Dettinger, Fransisca Kaufmann, MD Taking Active Self  doxycycline (VIBRA-TABS) 100 MG tablet [id number] No Take 1 tablet (100 mg total) by mouth 2 (two) times daily. Felipa Furnace, DPM Taking Active   DULoxetine (CYMBALTA) 60 MG capsule [id number]  Take 2 capsules (120 mg total) by mouth daily. Dettinger, Fransisca Kaufmann, MD  Active   fluticasone Asencion Islam) 50 MCG/ACT nasal spray [id number]  Place 2 sprays into both nostrils daily as needed for rhinitis. Dettinger, Fransisca Kaufmann, MD  Active   furosemide (LASIX) 40 MG tablet [id number]  Take 1 tablet (40 mg total) by mouth 2 (two) times daily as needed. Dettinger, Fransisca Kaufmann, MD  Active   gabapentin (NEURONTIN) 300 MG capsule [id number] No TAKE 1 CAPSULE BY MOUTH THREE TIMES A DAY Lorenda Peck, DPM Taking Active Self  gabapentin (NEURONTIN) 300 MG capsule [id number] No Take 1 capsule (300 mg total) by mouth 3 (three) times daily. Felipa Furnace, DPM Taking Active   gabapentin (NEURONTIN) 300 MG capsule [id number]  Take 1 capsule (300 mg total) by mouth 3 (three) times daily. Felipa Furnace, DPM  Active   ibuprofen (ADVIL) 800 MG tablet [id number] No Take 1 tablet (800 mg total) by mouth every 6 (six) hours as needed.  Patient taking differently: Take 800 mg by mouth every 6 (six) hours as needed for mild pain or moderate pain.   Felipa Furnace, DPM Taking Active Self  ibuprofen (ADVIL) 800 MG tablet [id number] No Take 1 tablet (800 mg total) by mouth every 6 (six) hours as needed. Felipa Furnace, DPM Taking Active   losartan (COZAAR) 100 MG tablet [id number]  Take 1 tablet (100 mg total) by mouth daily. Dettinger, Fransisca Kaufmann, MD  Active   omeprazole (PRILOSEC) 40 MG capsule [id number] No Take 40 mg by mouth daily before breakfast. [provider] Taking Active Self  pravastatin (PRAVACHOL) 80 MG tablet [id number]  Take 1 tablet (80 mg total) by mouth daily. Dettinger, Fransisca Kaufmann, MD  Active   Semaglutide, 1 MG/DOSE, 4 MG/3ML SOPN [id number]  Inject 1 mg as directed once a week. Dettinger, Fransisca Kaufmann, MD  Active   solifenacin (VESICARE) 10 MG tablet [id number]  Take 1 tablet (10 mg total) by mouth daily. Dettinger, Fransisca Kaufmann, MD  Active                Goals Addressed               This Visit's Progress     Patient Stated     T2DM, HLD, HTN PHARMD (pt-stated)        Current Barriers:  Unable to independently afford treatment regimen Unable to maintain control of T2DM, HLD, HTN  Pharmacist Clinical Goal(s):  patient will verbalize ability to afford treatment regimen maintain control of T2DM, HLD, HTN as evidenced by A1C<7%, LDL<70, BP<130/80  through collaboration with PharmD and provider.    Interventions: 1:1 collaboration with Dettinger, Fransisca Kaufmann, MD regarding development and update of comprehensive plan of care as evidenced by provider attestation and co-signature Inter-disciplinary care team collaboration (see longitudinal plan of care) Comprehensive medication review performed; medication list updated in electronic medical record  Diabetes: Goal on  Track (progressing): YES. Controlled; current treatment:OZEMPIC INCREASE TO '1MG'$  WEEKLY APPLICATION SUBMITTED FOR NOVO [place] PATIENT Brown (approved-patient received #3boxes this month) TOLERATING WELL/DENIES SIDE EFFECTS Denies personal and family history of Medullary thyroid cancer (MTC) Current glucose readings: fasting glucose: <135, post prandial glucose: N/A Denies hypoglycemic/hyperglycemic symptoms Discussed meal planning options and Plate method for healthy eating Avoid sugary drinks and desserts Incorporate balanced protein, non starchy veggies, 1 serving of carbohydrate with each meal Increase water intake Increase physical activity as able Current exercise: KNEE PROBLEMS/NEEDS REPLACEMENT; ALSO MENTIONS FOOT PROBLEMS  Hyperlipidemia LDL 68, currently controlled Continue current statin and dietary heart healthy recommendations Lipid Panel     Component Value Date/Time   CHOL 152 11/14/2021 1310   TRIG 203 (H) 11/14/2021 1310   HDL 51 11/14/2021 1310   CHOLHDL 3.0 11/14/2021 1310   LDLCALC 68 11/14/2021 1310   LABVLDL 33 11/14/2021  1310  Hypertension: BP<130s/80s per patient home report Compliant with medication Instructed patient to keep a check on this and report back if BP increases again Will continue to follow  Patient Goals/Self-Care Activities patient will:  - take medications as prescribed as evidenced by patient report and record review check glucose DAILY/FASTING OR SYMPTOMATIC, document, and provide at future appointments collaborate with provider on medication access solutions engage in dietary modifications by .FOLLOWING A HEART HEALTHY DIET/HEALTHY PLATE METHOD           Regina Eck, PharmD, BCPS, BCACP Clinical Pharmacist, [place]  II  T [phone]

## 2022-06-05 DIAGNOSIS — E785 Hyperlipidemia, unspecified: Secondary | ICD-10-CM

## 2022-06-05 DIAGNOSIS — I10 Essential (primary) hypertension: Secondary | ICD-10-CM

## 2022-06-05 DIAGNOSIS — E1159 Type 2 diabetes mellitus with other circulatory complications: Secondary | ICD-10-CM

## 2022-06-21 DIAGNOSIS — M25571 Pain in right ankle and joints of right foot: Secondary | ICD-10-CM | POA: Diagnosis not present

## 2022-06-21 DIAGNOSIS — M19071 Primary osteoarthritis, right ankle and foot: Secondary | ICD-10-CM | POA: Diagnosis not present

## 2022-07-04 ENCOUNTER — Telehealth: Payer: Self-pay | Admitting: Podiatry

## 2022-07-04 NOTE — Telephone Encounter (Signed)
Pt called stating she had surgery in June 2023 and there is still a staple in her surgery foot. Appt was offered for 3.5 and pt could not come.  She also asked if she would have to pay for this appt or would it be billed under the surgery. I told her I was not sure she would have to discuss with provider.

## 2022-07-10 ENCOUNTER — Ambulatory Visit (INDEPENDENT_AMBULATORY_CARE_PROVIDER_SITE_OTHER): Payer: Medicare Other

## 2022-07-10 ENCOUNTER — Ambulatory Visit (INDEPENDENT_AMBULATORY_CARE_PROVIDER_SITE_OTHER): Payer: Medicare Other | Admitting: Podiatry

## 2022-07-10 DIAGNOSIS — M7671 Peroneal tendinitis, right leg: Secondary | ICD-10-CM

## 2022-07-10 NOTE — Progress Notes (Signed)
Subjective:  Patient ID: Tiffany Brown, female    DOB: 1946/02/05,  MRN: [id number]  Chief Complaint  Patient presents with   Routine Post Op    77 y.o. female presents with the above complaint.  Patient presents with concern for possible staple in the soft tissue..  Patient saw an orthopedic surgeon that mention that she had a staple in the foot likely from skin incision.  She is here to see if it could be removed if needed.  She denies any other acute does not cause her pain it is not causing any sinus tract.   Review of Systems: Negative except as noted in the HPI. Denies N/V/F/Ch.  Past Medical History:  Diagnosis Date   Abnormal glucose    Depression with anxiety    Essential hypertension, benign    Fibromyalgia    GERD (gastroesophageal reflux disease)    Lymphedema of both lower extremities    and stomach   Melanoma ([place])    Near syncope    Other and unspecified hyperlipidemia    Pre-diabetes     Current Outpatient Medications:    amLODipine (NORVASC) 5 MG tablet, Take 1 tablet (5 mg total) by mouth daily., Disp: 90 tablet, Rfl: 3   buPROPion (WELLBUTRIN XL) 150 MG 24 hr tablet, Take 1 tablet (150 mg total) by mouth daily., Disp: 90 tablet, Rfl: 3   DULoxetine (CYMBALTA) 60 MG capsule, Take 2 capsules (120 mg total) by mouth daily., Disp: 180 capsule, Rfl: 3   fluticasone (FLONASE) 50 MCG/ACT nasal spray, Place 2 sprays into both nostrils daily as needed for rhinitis., Disp: 16 g, Rfl: 5   furosemide (LASIX) 40 MG tablet, Take 1 tablet (40 mg total) by mouth 2 (two) times daily as needed., Disp: 180 tablet, Rfl: 3   gabapentin (NEURONTIN) 300 MG capsule, TAKE 1 CAPSULE BY MOUTH THREE TIMES A DAY, Disp: 90 capsule, Rfl: 3   gabapentin (NEURONTIN) 300 MG capsule, Take 1 capsule (300 mg total) by mouth 3 (three) times daily., Disp: 90 capsule, Rfl: 3   ibuprofen (ADVIL) 800 MG tablet, Take 1 tablet (800 mg total) by mouth every 6 (six) hours as needed. (Patient taking  differently: Take 800 mg by mouth every 6 (six) hours as needed for mild pain or moderate pain.), Disp: 60 tablet, Rfl: 1   ibuprofen (ADVIL) 800 MG tablet, Take 1 tablet (800 mg total) by mouth every 6 (six) hours as needed., Disp: 60 tablet, Rfl: 1   losartan (COZAAR) 100 MG tablet, Take 1 tablet (100 mg total) by mouth daily., Disp: 90 tablet, Rfl: 3   omeprazole (PRILOSEC) 40 MG capsule, Take 40 mg by mouth daily before breakfast., Disp: , Rfl:    pravastatin (PRAVACHOL) 80 MG tablet, Take 1 tablet (80 mg total) by mouth daily., Disp: 90 tablet, Rfl: 3   Semaglutide, 1 MG/DOSE, 4 MG/3ML SOPN, Inject 1 mg as directed once a week., Disp: 9 mL, Rfl: 3   solifenacin (VESICARE) 10 MG tablet, Take 1 tablet (10 mg total) by mouth daily., Disp: 90 tablet, Rfl: 1  Social History   Tobacco Use  Smoking Status Former   Packs/day: 0.80   Years: 25.00   Total pack years: 20.00   Types: Cigarettes   Quit date: 05/06/1994   Years since quitting: 28.1  Smokeless Tobacco Never    No Known Allergies Objective:  There were no vitals filed for this visit. There is no height or weight on file to calculate BMI. Constitutional  Well developed. Well nourished.  Vascular Dorsalis pedis pulses palpable bilaterally. Posterior tibial pulses palpable bilaterally. Capillary refill normal to all digits.  No cyanosis or clubbing noted. Pedal hair growth normal.  Neurologic Normal speech. Oriented to person, place, and time. Epicritic sensation to light touch grossly present bilaterally.  Dermatologic Nails well groomed and normal in appearance. No open wounds. No skin lesions.  Orthopedic: No pain on palpation near the staple.  No   Radiographs:  3 views of skeletally mature adult right foot: Previous staple noted appears to be buried within the subcutaneous tissue.  Does not appear to be near any structure.  Adductovarus/cavovarus foot deformity noted Assessment:   1. Peroneal tendinitis, right     Plan:  Patient was evaluated and treated and all questions answered.  Right cavovarus foot deformity -All Questions and concerns were discussed with the patient in extensive detail -At this time patient stable as well.  Within the subcu soft tissue not near any vital structure patient does not need a removal of skin staple at this time.  I discussed with the patient that it is not near any vital structures and is not causing any harm.  The skin staple may have incorporated with overlying healing skin after surgery.  Clinically there is no skin staples I cannot feel or see on the incision -She will benefit from [place] for reconstruction in the future with a possible ankle fusion to help with healing and returning the ankle to rectus alignment. -I will see her back as needed.  No follow-ups on file.

## 2022-07-14 ENCOUNTER — Other Ambulatory Visit: Payer: Self-pay | Admitting: Family Medicine

## 2022-07-14 DIAGNOSIS — F3342 Major depressive disorder, recurrent, in full remission: Secondary | ICD-10-CM

## 2022-07-26 ENCOUNTER — Other Ambulatory Visit: Payer: Self-pay | Admitting: Orthopaedic Surgery

## 2022-07-26 DIAGNOSIS — M19071 Primary osteoarthritis, right ankle and foot: Secondary | ICD-10-CM | POA: Diagnosis not present

## 2022-07-26 DIAGNOSIS — M25571 Pain in right ankle and joints of right foot: Secondary | ICD-10-CM

## 2022-08-16 ENCOUNTER — Ambulatory Visit (INDEPENDENT_AMBULATORY_CARE_PROVIDER_SITE_OTHER): Payer: Medicare Other | Admitting: Family Medicine

## 2022-08-16 ENCOUNTER — Encounter: Payer: Self-pay | Admitting: Family Medicine

## 2022-08-16 VITALS — BP 143/75 | HR 71 | Ht 65.0 in | Wt 286.0 lb

## 2022-08-16 DIAGNOSIS — E1169 Type 2 diabetes mellitus with other specified complication: Secondary | ICD-10-CM | POA: Diagnosis not present

## 2022-08-16 DIAGNOSIS — E1159 Type 2 diabetes mellitus with other circulatory complications: Secondary | ICD-10-CM | POA: Diagnosis not present

## 2022-08-16 DIAGNOSIS — E782 Mixed hyperlipidemia: Secondary | ICD-10-CM

## 2022-08-16 DIAGNOSIS — I10 Essential (primary) hypertension: Secondary | ICD-10-CM

## 2022-08-16 DIAGNOSIS — I152 Hypertension secondary to endocrine disorders: Secondary | ICD-10-CM | POA: Diagnosis not present

## 2022-08-16 LAB — BAYER DCA HB A1C WAIVED: HB A1C (BAYER DCA - WAIVED): 6.2 % — ABNORMAL HIGH (ref 4.8–5.6)

## 2022-08-16 NOTE — Progress Notes (Signed)
BP (!) 143/75   Pulse 71   Ht 5\' 5"  (1.651 m)   Wt 286 lb (129.7 kg)   SpO2 98%   BMI 47.59 kg/m    Subjective:   Patient ID: Tiffany Brown, female    DOB: 10/22/1945, 77 y.o.   MRN: [id number]  HPI: Tiffany Brown is a 77 y.o. female presenting on 08/16/2022 for Medical Management of Chronic Issues, Diabetes, Hypertension, Back Pain, and Leg Pain   HPI Type 2 diabetes mellitus Patient comes in today for recheck of his diabetes. Patient has been currently taking Ozempic. Patient is currently on an ACE inhibitor/ARB. Patient has not seen an ophthalmologist this year. Patient denies any new issues with their feet. The symptom started onset as an adult htn and hld ARE RELATED TO DM   Hypertension Patient is currently on losartan and amlodipine, and their blood pressure today is 162/83. Patient denies any lightheadedness or dizziness. Patient denies headaches, blurred vision, chest pains, shortness of breath, or weakness. Denies any side effects from medication and is content with current medication.   Hyperlipidemia Patient is coming in for recheck of his hyperlipidemia. The patient is currently taking Pravastatin. They deny any issues with myalgias or history of liver damage from it. They deny any focal numbness or weakness or chest pain.   Relevant past medical, surgical, family and social history reviewed and updated as indicated. Interim medical history since our last visit reviewed. Allergies and medications reviewed and updated.  Review of Systems  Constitutional:  Negative for chills and fever.  Eyes:  Negative for redness and visual disturbance.  Respiratory:  Negative for chest tightness and shortness of breath.   Cardiovascular:  Negative for chest pain and leg swelling.  Musculoskeletal:  Negative for back pain and gait problem.  Skin:  Negative for rash.  Neurological:  Negative for dizziness, light-headedness and headaches.  Psychiatric/Behavioral:  Negative for  agitation and behavioral problems.   All other systems reviewed and are negative.   Per HPI unless specifically indicated above   Allergies as of 08/16/2022   No Known Allergies      Medication List        Accurate as of August 16, 2022 10:51 AM. If you have any questions, ask your nurse or doctor.          amLODipine 5 MG tablet Commonly known as: NORVASC Take 1 tablet (5 mg total) by mouth daily.   buPROPion 150 MG 24 hr tablet Commonly known as: WELLBUTRIN XL TAKE 1 TABLET BY MOUTH EVERY DAY   DULoxetine 60 MG capsule Commonly known as: CYMBALTA Take 2 capsules (120 mg total) by mouth daily.   fluticasone 50 MCG/ACT nasal spray Commonly known as: FLONASE Place 2 sprays into both nostrils daily as needed for rhinitis.   furosemide 40 MG tablet Commonly known as: LASIX Take 1 tablet (40 mg total) by mouth 2 (two) times daily as needed.   gabapentin 300 MG capsule Commonly known as: NEURONTIN TAKE 1 CAPSULE BY MOUTH THREE TIMES A DAY   gabapentin 300 MG capsule Commonly known as: NEURONTIN Take 1 capsule (300 mg total) by mouth 3 (three) times daily.   ibuprofen 800 MG tablet Commonly known as: ADVIL Take 1 tablet (800 mg total) by mouth every 6 (six) hours as needed. What changed: reasons to take this   ibuprofen 800 MG tablet Commonly known as: ADVIL Take 1 tablet (800 mg total) by mouth every 6 (six) hours as needed.  What changed: Another medication with the same name was changed. Make sure you understand how and when to take each.   losartan 100 MG tablet Commonly known as: COZAAR Take 1 tablet (100 mg total) by mouth daily.   omeprazole 40 MG capsule Commonly known as: PRILOSEC Take 40 mg by mouth daily before breakfast.   pravastatin 80 MG tablet Commonly known as: PRAVACHOL Take 1 tablet (80 mg total) by mouth daily.   Semaglutide (1 MG/DOSE) 4 MG/3ML Sopn Inject 1 mg as directed once a week.   solifenacin 10 MG tablet Commonly known  as: VESICARE Take 1 tablet (10 mg total) by mouth daily.         Objective:   BP (!) 143/75   Pulse 71   Ht  (1.651 m)   Wt 286 lb (129.7 kg)   SpO2 98%   BMI 47.59 kg/m   Wt Readings from Last 3 Encounters:  08/16/22 286 lb (129.7 kg)  05/17/22 283 lb (128.4 kg)  02/14/22 281 lb (127.5 kg)    Physical Exam Vitals and nursing note reviewed.  Constitutional:      General: She is not in acute distress.    Appearance: She is well-developed. She is not diaphoretic.  Eyes:     Conjunctiva/sclera: Conjunctivae normal.  Cardiovascular:     Rate and Rhythm: Normal rate and regular rhythm.     Heart sounds: Normal heart sounds. No murmur heard. Pulmonary:     Effort: Pulmonary effort is normal. No respiratory distress.     Breath sounds: Normal breath sounds. No wheezing.  Musculoskeletal:        General: No swelling or tenderness. Normal range of motion.  Skin:    General: Skin is warm and dry.     Findings: No rash.  Neurological:     Mental Status: She is alert and oriented to person, place, and time.     Coordination: Coordination normal.  Psychiatric:        Behavior: Behavior normal.       Assessment & Plan:   Problem List Items Addressed This Visit       Cardiovascular and Mediastinum   Essential hypertension, benign     Endocrine   DM type 2 with diabetic dyslipidemia - Primary   Relevant Orders   Bayer DCA Hb A1c Waived     Other   Hyperlipidemia    A1c is 6.2.  Blood pressure and heart rate looks decent, will keep an eye on it closely.  Continue current medicine.  She is going to have a foot surgery soon and before that gets her up and moving around and recommended that she do the therapy after. Follow up plan: Return in about 3 months (around 11/15/2022), or if symptoms worsen or fail to improve, for Diabetes hypertension cholesterol.  Counseling provided for all of the vaccine components Orders Placed This Encounter  Procedures   Bayer  DCA Hb A1c Waived    Arville Care, MD [facility] 08/16/2022, 10:51 AM

## 2022-08-21 ENCOUNTER — Ambulatory Visit
Admission: RE | Admit: 2022-08-21 | Discharge: 2022-08-21 | Disposition: A | Discharge status: Home / Self Care | Payer: Medicare Other | Source: Ambulatory Visit | Attending: Orthopaedic Surgery | Admitting: Orthopaedic Surgery

## 2022-08-21 DIAGNOSIS — M25571 Pain in right ankle and joints of right foot: Secondary | ICD-10-CM

## 2022-08-21 DIAGNOSIS — R600 Localized edema: Secondary | ICD-10-CM | POA: Diagnosis not present

## 2022-08-21 DIAGNOSIS — Z01818 Encounter for other preprocedural examination: Secondary | ICD-10-CM | POA: Diagnosis not present

## 2022-08-26 ENCOUNTER — Telehealth: Payer: Self-pay | Admitting: Family Medicine

## 2022-08-26 NOTE — Telephone Encounter (Signed)
Pt called requesting to speak with Durward Mallard regarding her patient assistance for Ozempic.

## 2022-08-28 DIAGNOSIS — M19071 Primary osteoarthritis, right ankle and foot: Secondary | ICD-10-CM | POA: Diagnosis not present

## 2022-08-29 ENCOUNTER — Other Ambulatory Visit: Payer: Self-pay | Admitting: Orthopaedic Surgery

## 2022-08-29 DIAGNOSIS — M66879 Spontaneous rupture of other tendons, unspecified ankle and foot: Secondary | ICD-10-CM | POA: Diagnosis not present

## 2022-08-29 DIAGNOSIS — R262 Difficulty in walking, not elsewhere classified: Secondary | ICD-10-CM | POA: Diagnosis not present

## 2022-08-30 NOTE — Telephone Encounter (Signed)
Rec'd fax from [employer] [facility]. Application missing sig (directions). Will correct and fax back.

## 2022-09-04 NOTE — Telephone Encounter (Signed)
Re-submitted application for OZEMPIC to [employer] for patient assistance via online portal.   Phone: [phone]

## 2022-09-06 ENCOUNTER — Telehealth: Payer: Self-pay | Admitting: Family Medicine

## 2022-09-06 NOTE — Telephone Encounter (Signed)
Informed pt that she should have refills [facility] based off of her current med list. Pt will call pharmacy to verify and call us back if needed.

## 2022-09-09 ENCOUNTER — Encounter (HOSPITAL_COMMUNITY): Payer: Self-pay | Admitting: Orthopaedic Surgery

## 2022-09-09 ENCOUNTER — Other Ambulatory Visit: Payer: Self-pay

## 2022-09-09 NOTE — Progress Notes (Signed)
Spoke with pt for pre-op call. Pt denies cardiac history. Pt is treated for HTN. Pt states she is pre-diabetic. Pt is on Ozempic, she states she was not instructed to hold it prior to surgery. She takes it every Wednesday. Last dose was 09/04/22.   Shower/bath instructions given to pt

## 2022-09-09 NOTE — Telephone Encounter (Signed)
Pt called to let nurse know that she called the pharmacy and was told that her copay for Ozempic would be $443 which pt cannot afford. Needs advise.

## 2022-09-09 NOTE — Telephone Encounter (Signed)
Please help this patient, looks like she cannot pick it up at the pharmacy and is on prescription assistance with you, I do not know if she needs another appointment.

## 2022-09-10 ENCOUNTER — Ambulatory Visit (HOSPITAL_BASED_OUTPATIENT_CLINIC_OR_DEPARTMENT_OTHER): Payer: Medicare Other | Admitting: Physician Assistant

## 2022-09-10 ENCOUNTER — Other Ambulatory Visit: Payer: Self-pay

## 2022-09-10 ENCOUNTER — Ambulatory Visit (HOSPITAL_COMMUNITY): Payer: Medicare Other

## 2022-09-10 ENCOUNTER — Inpatient Hospital Stay (HOSPITAL_COMMUNITY)
Admission: RE | Admit: 2022-09-10 | Discharge: 2022-09-13 | DRG: 908 | Disposition: A | Discharge status: Skilled Nursing Facility | Payer: Medicare Other | Attending: Orthopaedic Surgery | Admitting: Orthopaedic Surgery

## 2022-09-10 ENCOUNTER — Encounter (HOSPITAL_COMMUNITY): Payer: Self-pay | Admitting: Orthopaedic Surgery

## 2022-09-10 ENCOUNTER — Encounter (HOSPITAL_COMMUNITY)
Admission: RE | Disposition: A | Discharge status: Skilled Nursing Facility | Payer: Self-pay | Source: Home / Self Care | Attending: Orthopaedic Surgery

## 2022-09-10 ENCOUNTER — Ambulatory Visit (HOSPITAL_COMMUNITY): Payer: Medicare Other | Admitting: Physician Assistant

## 2022-09-10 DIAGNOSIS — I10 Essential (primary) hypertension: Secondary | ICD-10-CM | POA: Diagnosis present

## 2022-09-10 DIAGNOSIS — E119 Type 2 diabetes mellitus without complications: Secondary | ICD-10-CM | POA: Diagnosis present

## 2022-09-10 DIAGNOSIS — M25571 Pain in right ankle and joints of right foot: Secondary | ICD-10-CM | POA: Diagnosis not present

## 2022-09-10 DIAGNOSIS — Z471 Aftercare following joint replacement surgery: Secondary | ICD-10-CM | POA: Diagnosis not present

## 2022-09-10 DIAGNOSIS — Z6841 Body Mass Index (BMI) 40.0 and over, adult: Secondary | ICD-10-CM

## 2022-09-10 DIAGNOSIS — Z7401 Bed confinement status: Secondary | ICD-10-CM | POA: Diagnosis not present

## 2022-09-10 DIAGNOSIS — Z96661 Presence of right artificial ankle joint: Secondary | ICD-10-CM | POA: Diagnosis not present

## 2022-09-10 DIAGNOSIS — M62471 Contracture of muscle, right ankle and foot: Secondary | ICD-10-CM | POA: Diagnosis not present

## 2022-09-10 DIAGNOSIS — Z96651 Presence of right artificial knee joint: Secondary | ICD-10-CM | POA: Diagnosis not present

## 2022-09-10 DIAGNOSIS — S9304XA Dislocation of right ankle joint, initial encounter: Secondary | ICD-10-CM | POA: Diagnosis present

## 2022-09-10 DIAGNOSIS — Z87891 Personal history of nicotine dependence: Secondary | ICD-10-CM

## 2022-09-10 DIAGNOSIS — Z801 Family history of malignant neoplasm of trachea, bronchus and lung: Secondary | ICD-10-CM | POA: Diagnosis not present

## 2022-09-10 DIAGNOSIS — Z807 Family history of other malignant neoplasms of lymphoid, hematopoietic and related tissues: Secondary | ICD-10-CM | POA: Diagnosis not present

## 2022-09-10 DIAGNOSIS — Z79899 Other long term (current) drug therapy: Secondary | ICD-10-CM

## 2022-09-10 DIAGNOSIS — M19071 Primary osteoarthritis, right ankle and foot: Secondary | ICD-10-CM | POA: Diagnosis not present

## 2022-09-10 DIAGNOSIS — F32A Depression, unspecified: Secondary | ICD-10-CM | POA: Diagnosis present

## 2022-09-10 DIAGNOSIS — M79671 Pain in right foot: Secondary | ICD-10-CM | POA: Diagnosis present

## 2022-09-10 DIAGNOSIS — Z7985 Long-term (current) use of injectable non-insulin antidiabetic drugs: Secondary | ICD-10-CM

## 2022-09-10 DIAGNOSIS — R6889 Other general symptoms and signs: Secondary | ICD-10-CM | POA: Diagnosis not present

## 2022-09-10 DIAGNOSIS — Z743 Need for continuous supervision: Secondary | ICD-10-CM | POA: Diagnosis not present

## 2022-09-10 DIAGNOSIS — M216X1 Other acquired deformities of right foot: Secondary | ICD-10-CM | POA: Diagnosis not present

## 2022-09-10 DIAGNOSIS — Z9049 Acquired absence of other specified parts of digestive tract: Secondary | ICD-10-CM

## 2022-09-10 DIAGNOSIS — M24574 Contracture, right foot: Secondary | ICD-10-CM | POA: Diagnosis not present

## 2022-09-10 DIAGNOSIS — M9689 Other intraoperative and postprocedural complications and disorders of the musculoskeletal system: Principal | ICD-10-CM | POA: Diagnosis present

## 2022-09-10 DIAGNOSIS — Z8249 Family history of ischemic heart disease and other diseases of the circulatory system: Secondary | ICD-10-CM | POA: Diagnosis not present

## 2022-09-10 DIAGNOSIS — Z85820 Personal history of malignant melanoma of skin: Secondary | ICD-10-CM

## 2022-09-10 DIAGNOSIS — M797 Fibromyalgia: Secondary | ICD-10-CM | POA: Diagnosis present

## 2022-09-10 DIAGNOSIS — G8918 Other acute postprocedural pain: Secondary | ICD-10-CM | POA: Diagnosis not present

## 2022-09-10 DIAGNOSIS — E785 Hyperlipidemia, unspecified: Secondary | ICD-10-CM | POA: Diagnosis present

## 2022-09-10 DIAGNOSIS — S93314A Dislocation of tarsal joint of right foot, initial encounter: Secondary | ICD-10-CM

## 2022-09-10 DIAGNOSIS — K219 Gastro-esophageal reflux disease without esophagitis: Secondary | ICD-10-CM | POA: Diagnosis not present

## 2022-09-10 DIAGNOSIS — E7849 Other hyperlipidemia: Secondary | ICD-10-CM | POA: Diagnosis not present

## 2022-09-10 DIAGNOSIS — S8291XA Unspecified fracture of right lower leg, initial encounter for closed fracture: Secondary | ICD-10-CM | POA: Diagnosis not present

## 2022-09-10 DIAGNOSIS — F419 Anxiety disorder, unspecified: Secondary | ICD-10-CM | POA: Diagnosis present

## 2022-09-10 DIAGNOSIS — R339 Retention of urine, unspecified: Secondary | ICD-10-CM | POA: Diagnosis not present

## 2022-09-10 DIAGNOSIS — I890 Lymphedema, not elsewhere classified: Secondary | ICD-10-CM | POA: Diagnosis present

## 2022-09-10 DIAGNOSIS — S93334A Other dislocation of right foot, initial encounter: Secondary | ICD-10-CM | POA: Diagnosis not present

## 2022-09-10 HISTORY — PX: ANKLE ARTHROSCOPY WITH ARTHRODESIS: SHX5579

## 2022-09-10 HISTORY — DX: Unspecified osteoarthritis, unspecified site: M19.90

## 2022-09-10 LAB — BASIC METABOLIC PANEL
Anion gap: 7 (ref 5–15)
BUN: 15 mg/dL (ref 8–23)
CO2: 31 mmol/L (ref 22–32)
Calcium: 9 mg/dL (ref 8.9–10.3)
Chloride: 100 mmol/L (ref 98–111)
Creatinine, Ser: 0.78 mg/dL (ref 0.44–1.00)
GFR, Estimated: >60 mL/min (ref >60)
Glucose, Bld: 110 mg/dL — ABNORMAL HIGH (ref 70–99)
Potassium: 3.9 mmol/L (ref 3.5–5.1)
Sodium: 138 mmol/L (ref 135–145)

## 2022-09-10 LAB — CBC
HCT: 38.5 % (ref 36.0–46.0)
Hemoglobin: 13 g/dL (ref 12.0–15.0)
MCH: 31.7 pg (ref 26.0–34.0)
MCHC: 33.8 g/dL (ref 30.0–36.0)
MCV: 93.9 fL (ref 80.0–100.0)
Platelets: 176 10*3/uL (ref 150–400)
RBC: 4.1 MIL/uL (ref 3.87–5.11)
RDW: 14.4 % (ref 11.5–15.5)
WBC: 11.5 10*3/uL — ABNORMAL HIGH (ref 4.0–10.5)
nRBC: 0 % (ref 0.0–0.2)

## 2022-09-10 LAB — SURGICAL PCR SCREEN
MRSA, PCR: NEGATIVE
Staphylococcus aureus: POSITIVE — AB

## 2022-09-10 SURGERY — ARTHROSCOPY, ANKLE, WITH FUSION
Anesthesia: General | Site: Ankle | Laterality: Right

## 2022-09-10 MED ORDER — DOCUSATE SODIUM 100 MG PO CAPS
100.0000 mg | ORAL_CAPSULE | Freq: Two times a day (BID) | ORAL | Status: DC
  Administered 2022-09-10 – 2022-09-13 (×7): 100 mg via ORAL
  Filled 2022-09-10 (×7): qty 1

## 2022-09-10 MED ORDER — METHOCARBAMOL 1000 MG/10ML IJ SOLN: 500.0000 mg | Freq: Four times a day (QID) | INTRAVENOUS | Status: DC | PRN

## 2022-09-10 MED ORDER — ORAL CARE MOUTH RINSE: 15.0000 mL | Freq: Once | OROMUCOSAL | Status: AC

## 2022-09-10 MED ORDER — CEFAZOLIN IN SODIUM CHLORIDE 3-0.9 GM/100ML-% IV SOLN
3.0000 g | INTRAVENOUS | Status: AC
  Administered 2022-09-10: 3 g via INTRAVENOUS
  Filled 2022-09-10: qty 100

## 2022-09-10 MED ORDER — METOCLOPRAMIDE HCL 5 MG PO TABS: 5.0000 mg | ORAL_TABLET | Freq: Three times a day (TID) | ORAL | Status: DC | PRN

## 2022-09-10 MED ORDER — PRAVASTATIN SODIUM 40 MG PO TABS
80.0000 mg | ORAL_TABLET | Freq: Every day | ORAL | Status: DC
  Administered 2022-09-11 – 2022-09-13 (×3): 80 mg via ORAL
  Filled 2022-09-10 (×3): qty 2

## 2022-09-10 MED ORDER — ONDANSETRON HCL 4 MG/2ML IJ SOLN
INTRAMUSCULAR | Status: DC | PRN
  Administered 2022-09-10: 4 mg via INTRAVENOUS

## 2022-09-10 MED ORDER — LIDOCAINE 2% (20 MG/ML) 5 ML SYRINGE
INTRAMUSCULAR | Status: AC
  Filled 2022-09-10: qty 5

## 2022-09-10 MED ORDER — OXYCODONE HCL 5 MG PO TABS: 5.0000 mg | ORAL_TABLET | Freq: Once | ORAL | Status: DC | PRN

## 2022-09-10 MED ORDER — AMISULPRIDE (ANTIEMETIC) 5 MG/2ML IV SOLN: 10.0000 mg | Freq: Once | INTRAVENOUS | Status: DC | PRN

## 2022-09-10 MED ORDER — LIDOCAINE 2% (20 MG/ML) 5 ML SYRINGE
INTRAMUSCULAR | Status: DC | PRN
  Administered 2022-09-10: 60 mg via INTRAVENOUS

## 2022-09-10 MED ORDER — MEPERIDINE HCL 25 MG/ML IJ SOLN: 6.2500 mg | INTRAMUSCULAR | Status: DC | PRN

## 2022-09-10 MED ORDER — HYDROMORPHONE HCL 1 MG/ML IJ SOLN
0.2500 mg | INTRAMUSCULAR | Status: DC | PRN
  Administered 2022-09-10: 0.25 mg via INTRAVENOUS

## 2022-09-10 MED ORDER — MORPHINE SULFATE (PF) 2 MG/ML IV SOLN
0.5000 mg | INTRAVENOUS | Status: DC | PRN
  Administered 2022-09-11: 1 mg via INTRAVENOUS
  Filled 2022-09-10: qty 1

## 2022-09-10 MED ORDER — HYDROCODONE-ACETAMINOPHEN 5-325 MG PO TABS
1.0000 | ORAL_TABLET | ORAL | Status: DC | PRN
  Administered 2022-09-11 – 2022-09-13 (×6): 2 via ORAL
  Filled 2022-09-10 (×6): qty 2

## 2022-09-10 MED ORDER — PROMETHAZINE HCL 25 MG/ML IJ SOLN: 6.2500 mg | INTRAMUSCULAR | Status: DC | PRN

## 2022-09-10 MED ORDER — CEFAZOLIN IN SODIUM CHLORIDE 3-0.9 GM/100ML-% IV SOLN
INTRAVENOUS | Status: AC
  Filled 2022-09-10: qty 100

## 2022-09-10 MED ORDER — DEXAMETHASONE SODIUM PHOSPHATE 10 MG/ML IJ SOLN
INTRAMUSCULAR | Status: AC
  Filled 2022-09-10: qty 1

## 2022-09-10 MED ORDER — PROPOFOL 10 MG/ML IV BOLUS
INTRAVENOUS | Status: DC | PRN
  Administered 2022-09-10: 150 mg via INTRAVENOUS

## 2022-09-10 MED ORDER — CHLORHEXIDINE GLUCONATE 0.12 % MT SOLN
15.0000 mL | Freq: Once | OROMUCOSAL | Status: AC
  Administered 2022-09-10: 15 mL via OROMUCOSAL
  Filled 2022-09-10: qty 15

## 2022-09-10 MED ORDER — SUGAMMADEX SODIUM 200 MG/2ML IV SOLN
INTRAVENOUS | Status: DC | PRN
  Administered 2022-09-10: 200 mg via INTRAVENOUS

## 2022-09-10 MED ORDER — FENTANYL CITRATE (PF) 250 MCG/5ML IJ SOLN
INTRAMUSCULAR | Status: AC
  Filled 2022-09-10: qty 5

## 2022-09-10 MED ORDER — PROPOFOL 10 MG/ML IV BOLUS
INTRAVENOUS | Status: AC
  Filled 2022-09-10: qty 20

## 2022-09-10 MED ORDER — ONDANSETRON HCL 4 MG/2ML IJ SOLN: 4.0000 mg | Freq: Four times a day (QID) | INTRAMUSCULAR | Status: DC | PRN

## 2022-09-10 MED ORDER — FESOTERODINE FUMARATE ER 4 MG PO TB24
4.0000 mg | ORAL_TABLET | Freq: Every day | ORAL | Status: DC
  Administered 2022-09-11 – 2022-09-13 (×3): 4 mg via ORAL
  Filled 2022-09-10 (×3): qty 1

## 2022-09-10 MED ORDER — AMLODIPINE BESYLATE 5 MG PO TABS
5.0000 mg | ORAL_TABLET | Freq: Every day | ORAL | Status: DC
  Administered 2022-09-10 – 2022-09-11 (×2): 5 mg via ORAL
  Filled 2022-09-10 (×3): qty 1

## 2022-09-10 MED ORDER — FENTANYL CITRATE (PF) 250 MCG/5ML IJ SOLN
INTRAMUSCULAR | Status: DC | PRN
  Administered 2022-09-10 (×3): 50 ug via INTRAVENOUS
  Administered 2022-09-10: 25 ug via INTRAVENOUS
  Administered 2022-09-10: 50 ug via INTRAVENOUS
  Administered 2022-09-10: 25 ug via INTRAVENOUS

## 2022-09-10 MED ORDER — OXYCODONE HCL 5 MG/5ML PO SOLN: 5.0000 mg | Freq: Once | ORAL | Status: DC | PRN

## 2022-09-10 MED ORDER — ROCURONIUM BROMIDE 10 MG/ML (PF) SYRINGE
PREFILLED_SYRINGE | INTRAVENOUS | Status: DC | PRN
  Administered 2022-09-10: 100 mg via INTRAVENOUS

## 2022-09-10 MED ORDER — CEFAZOLIN SODIUM-DEXTROSE 2-4 GM/100ML-% IV SOLN
2.0000 g | Freq: Four times a day (QID) | INTRAVENOUS | Status: AC
  Administered 2022-09-10 – 2022-09-11 (×3): 2 g via INTRAVENOUS
  Filled 2022-09-10 (×3): qty 100

## 2022-09-10 MED ORDER — METHOCARBAMOL 500 MG PO TABS: 500.0000 mg | ORAL_TABLET | Freq: Four times a day (QID) | ORAL | Status: DC | PRN

## 2022-09-10 MED ORDER — ACETAMINOPHEN 325 MG PO TABS
325.0000 mg | ORAL_TABLET | Freq: Four times a day (QID) | ORAL | Status: DC | PRN
  Administered 2022-09-11: 650 mg via ORAL
  Filled 2022-09-10: qty 2

## 2022-09-10 MED ORDER — LOSARTAN POTASSIUM 50 MG PO TABS
100.0000 mg | ORAL_TABLET | Freq: Every day | ORAL | Status: DC
  Administered 2022-09-10 – 2022-09-13 (×4): 100 mg via ORAL
  Filled 2022-09-10 (×4): qty 2

## 2022-09-10 MED ORDER — METOCLOPRAMIDE HCL 5 MG/ML IJ SOLN: 5.0000 mg | Freq: Three times a day (TID) | INTRAMUSCULAR | Status: DC | PRN

## 2022-09-10 MED ORDER — LACTATED RINGERS IV SOLN: INTRAVENOUS | Status: DC

## 2022-09-10 MED ORDER — ASPIRIN 325 MG PO TABS
325.0000 mg | ORAL_TABLET | Freq: Every day | ORAL | Status: DC
  Administered 2022-09-11 – 2022-09-13 (×3): 325 mg via ORAL
  Filled 2022-09-10 (×3): qty 1

## 2022-09-10 MED ORDER — BUPROPION HCL ER (XL) 150 MG PO TB24
150.0000 mg | ORAL_TABLET | Freq: Every day | ORAL | Status: DC
  Administered 2022-09-10 – 2022-09-12 (×3): 150 mg via ORAL
  Filled 2022-09-10 (×3): qty 1

## 2022-09-10 MED ORDER — DIPHENHYDRAMINE HCL 12.5 MG/5ML PO ELIX: 12.5000 mg | ORAL_SOLUTION | ORAL | Status: DC | PRN

## 2022-09-10 MED ORDER — VANCOMYCIN HCL 1000 MG IV SOLR
INTRAVENOUS | Status: DC | PRN
  Administered 2022-09-10: 1000 mg via TOPICAL

## 2022-09-10 MED ORDER — ROCURONIUM BROMIDE 10 MG/ML (PF) SYRINGE
PREFILLED_SYRINGE | INTRAVENOUS | Status: AC
  Filled 2022-09-10: qty 10

## 2022-09-10 MED ORDER — GABAPENTIN 300 MG PO CAPS
300.0000 mg | ORAL_CAPSULE | Freq: Three times a day (TID) | ORAL | Status: DC
  Administered 2022-09-10 – 2022-09-13 (×9): 300 mg via ORAL
  Filled 2022-09-10 (×9): qty 1

## 2022-09-10 MED ORDER — PHENYLEPHRINE HCL-NACL 20-0.9 MG/250ML-% IV SOLN
INTRAVENOUS | Status: DC | PRN
  Administered 2022-09-10: 40 ug/min via INTRAVENOUS

## 2022-09-10 MED ORDER — MUPIROCIN 2 % EX OINT
1.0000 | TOPICAL_OINTMENT | Freq: Two times a day (BID) | CUTANEOUS | Status: DC
  Administered 2022-09-10 – 2022-09-13 (×6): 1 via NASAL
  Filled 2022-09-10 (×3): qty 22

## 2022-09-10 MED ORDER — DEXAMETHASONE SODIUM PHOSPHATE 10 MG/ML IJ SOLN
INTRAMUSCULAR | Status: DC | PRN
  Administered 2022-09-10: 5 mg via INTRAVENOUS

## 2022-09-10 MED ORDER — CHLORHEXIDINE GLUCONATE CLOTH 2 % EX PADS
6.0000 | MEDICATED_PAD | Freq: Every day | CUTANEOUS | Status: DC
  Administered 2022-09-11 – 2022-09-13 (×3): 6 via TOPICAL

## 2022-09-10 MED ORDER — VANCOMYCIN HCL 1000 MG IV SOLR
INTRAVENOUS | Status: AC
  Filled 2022-09-10: qty 20

## 2022-09-10 MED ORDER — ONDANSETRON HCL 4 MG PO TABS: 4.0000 mg | ORAL_TABLET | Freq: Four times a day (QID) | ORAL | Status: DC | PRN

## 2022-09-10 MED ORDER — ROPIVACAINE HCL 5 MG/ML IJ SOLN
INTRAMUSCULAR | Status: DC | PRN
  Administered 2022-09-10: 50 mL via PERINEURAL

## 2022-09-10 MED ORDER — NAPROXEN 250 MG PO TABS
250.0000 mg | ORAL_TABLET | Freq: Two times a day (BID) | ORAL | Status: DC
  Administered 2022-09-10 – 2022-09-13 (×6): 250 mg via ORAL
  Filled 2022-09-10 (×6): qty 1

## 2022-09-10 MED ORDER — HYDROCODONE-ACETAMINOPHEN 7.5-325 MG PO TABS: 1.0000 | ORAL_TABLET | ORAL | Status: DC | PRN

## 2022-09-10 MED ORDER — ONDANSETRON HCL 4 MG/2ML IJ SOLN
INTRAMUSCULAR | Status: AC
  Filled 2022-09-10: qty 2

## 2022-09-10 MED ORDER — HYDROMORPHONE HCL 1 MG/ML IJ SOLN
INTRAMUSCULAR | Status: AC
  Filled 2022-09-10: qty 1

## 2022-09-10 MED ORDER — FUROSEMIDE 40 MG PO TABS: 40.0000 mg | ORAL_TABLET | Freq: Two times a day (BID) | ORAL | Status: DC | PRN

## 2022-09-10 MED ORDER — DULOXETINE HCL 60 MG PO CPEP
60.0000 mg | ORAL_CAPSULE | Freq: Two times a day (BID) | ORAL | Status: DC
  Administered 2022-09-10 – 2022-09-13 (×6): 60 mg via ORAL
  Filled 2022-09-10 (×6): qty 1

## 2022-09-10 MED ORDER — 0.9 % SODIUM CHLORIDE (POUR BTL) OPTIME
TOPICAL | Status: DC | PRN
  Administered 2022-09-10: 1000 mL

## 2022-09-10 SURGICAL SUPPLY — 101 items
APL PRP STRL LF DISP 70% ISPRP (MISCELLANEOUS) ×4
BAG COUNTER SPONGE SURGICOUNT (BAG) ×2 IMPLANT
BAG SPNG CNTER NS LX DISP (BAG) ×2
BANDAGE ESMARK 6X9 LF (GAUZE/BANDAGES/DRESSINGS) IMPLANT
BIT DRILL 4.8X200 CANN (BIT) ×1 IMPLANT
BIT DRILL CANN 3.5MM (DRILL) ×1 IMPLANT
BLADE SURG 15 STRL LF DISP TIS (BLADE) ×7 IMPLANT
BLADE SURG 15 STRL SS (BLADE) ×12
BNDG CMPR 5X6 CHSV STRCH STRL (GAUZE/BANDAGES/DRESSINGS)
BNDG CMPR 9X6 STRL LF SNTH (GAUZE/BANDAGES/DRESSINGS)
BNDG CMPR MED 10X6 ELC LF (GAUZE/BANDAGES/DRESSINGS) ×2
BNDG CMPR MED 15X6 ELC VLCR LF (GAUZE/BANDAGES/DRESSINGS) ×2
BNDG CMPR STD VLCR NS LF 5.8X4 (GAUZE/BANDAGES/DRESSINGS) ×2
BNDG COHESIVE 4X5 TAN STRL (GAUZE/BANDAGES/DRESSINGS) IMPLANT
BNDG COHESIVE 6X5 TAN ST LF (GAUZE/BANDAGES/DRESSINGS) IMPLANT
BNDG ELASTIC 4X5.8 VLCR NS LF (GAUZE/BANDAGES/DRESSINGS) ×1 IMPLANT
BNDG ELASTIC 6X10 VLCR STRL LF (GAUZE/BANDAGES/DRESSINGS) ×2 IMPLANT
BNDG ELASTIC 6X15 VLCR STRL LF (GAUZE/BANDAGES/DRESSINGS) ×1 IMPLANT
BNDG ESMARK 6X9 LF (GAUZE/BANDAGES/DRESSINGS)
BULB SUCTION JP 400 (SUCTIONS) ×1 IMPLANT
CANISTER SUCT 3000ML PPV (MISCELLANEOUS) ×2 IMPLANT
CHLORAPREP W/TINT 26 (MISCELLANEOUS) ×4 IMPLANT
COOLER ICEMAN CLASSIC (MISCELLANEOUS) ×1 IMPLANT
COVER SURGICAL LIGHT HANDLE (MISCELLANEOUS) ×2 IMPLANT
CUFF TOURN SGL QUICK 34 (TOURNIQUET CUFF) ×2
CUFF TOURN SGL QUICK 42 (TOURNIQUET CUFF) IMPLANT
CUFF TRNQT CYL 34X4.125X (TOURNIQUET CUFF) ×2 IMPLANT
DRAIN CHANNEL 19F RND (DRAIN) ×1 IMPLANT
DRAPE ARTHROSCOPY W/POUCH 114 (DRAPES) ×2 IMPLANT
DRAPE C-ARM 42X72 X-RAY (DRAPES) ×1 IMPLANT
DRAPE U-SHAPE 47X51 STRL (DRAPES) ×2 IMPLANT
DRILL CANN 3.5MM (DRILL) ×2
DRSG MEPITEL 4X7.2 (GAUZE/BANDAGES/DRESSINGS) ×2 IMPLANT
DRSG TEGADERM 2-3/8X2-3/4 SM (GAUZE/BANDAGES/DRESSINGS) ×1 IMPLANT
DRSG XEROFORM 1X8 (GAUZE/BANDAGES/DRESSINGS) ×3 IMPLANT
ELECT REM PT RETURN 9FT ADLT (ELECTROSURGICAL) ×2
ELECTRODE REM PT RTRN 9FT ADLT (ELECTROSURGICAL) ×2 IMPLANT
EXCALIBUR 3.8MM X 13CM (MISCELLANEOUS) ×2 IMPLANT
GAUZE PAD ABD 8X10 STRL (GAUZE/BANDAGES/DRESSINGS) ×4 IMPLANT
GAUZE SPONGE 4X4 12PLY STRL (GAUZE/BANDAGES/DRESSINGS) ×2 IMPLANT
GAUZE SPONGE 4X4 12PLY STRL LF (GAUZE/BANDAGES/DRESSINGS) ×2 IMPLANT
GAUZE XEROFORM 1X8 LF (GAUZE/BANDAGES/DRESSINGS) ×2 IMPLANT
GLOVE BIOGEL M STRL SZ7.5 (GLOVE) ×2 IMPLANT
GLOVE BIOGEL PI IND STRL 8 (GLOVE) ×2 IMPLANT
GLOVE SRG 8 PF TXTR STRL LF DI (GLOVE) ×2 IMPLANT
GLOVE SURG ENC TEXT LTX SZ7.5 (GLOVE) ×2 IMPLANT
GLOVE SURG UNDER POLY LF SZ8 (GLOVE) ×2
GOWN STRL REUS W/ TWL LRG LVL3 (GOWN DISPOSABLE) ×2 IMPLANT
GOWN STRL REUS W/ TWL XL LVL3 (GOWN DISPOSABLE) ×4 IMPLANT
GOWN STRL REUS W/TWL LRG LVL3 (GOWN DISPOSABLE) ×2
GOWN STRL REUS W/TWL XL LVL3 (GOWN DISPOSABLE) ×4
K-WIRE 1.8 (WIRE) ×2
K-WIRE FX200X1.8XTROC TIP (WIRE) ×2
KIT BASIN OR (CUSTOM PROCEDURE TRAY) ×2 IMPLANT
KIT TURNOVER KIT B (KITS) ×2 IMPLANT
KWIRE FX200X1.8XTROC TIP (WIRE) ×1 IMPLANT
NDL 18GX1X1/2 (RX/OR ONLY) (NEEDLE) ×1 IMPLANT
NEEDLE 18GX1X1/2 (RX/OR ONLY) (NEEDLE) ×2 IMPLANT
NS IRRIG 1000ML POUR BTL (IV SOLUTION) ×2 IMPLANT
PACK ORTHO EXTREMITY (CUSTOM PROCEDURE TRAY) ×2 IMPLANT
PAD ARMBOARD 7.5X6 YLW CONV (MISCELLANEOUS) ×4 IMPLANT
PAD CAST 4YDX4 CTTN HI CHSV (CAST SUPPLIES) ×2 IMPLANT
PAD COLD SHLDR UNI XL WRAP-ON (PAD) ×2
PAD COLD UNI XL WRAP-ON (PAD) ×1 IMPLANT
PADDING CAST ABS COTTON 4X4 ST (CAST SUPPLIES) ×1 IMPLANT
PADDING CAST COTTON 4X4 STRL (CAST SUPPLIES) ×2
PIN GUIDE DRIL TIP 2.8X300 STE (PIN) ×2 IMPLANT
SCREW CANN 5.0X50MM (Screw) ×2 IMPLANT
SCREW CANN PT 50X5XCANN NS MTP (Screw) ×2 IMPLANT
SCREW CANN PT 5X50 NS (Screw) IMPLANT
SCREW CANNULATED 8.0X45MM (Screw) ×1 IMPLANT
SCREW CANNULATED HIP 8.0X75MM (Screw) ×1 IMPLANT
SCREW P/T IMPL CANN 5.0X36 (Screw) ×1 IMPLANT
SCREW PT 5.0X46MM (Screw) ×1 IMPLANT
SPIKE FLUID TRANSFER (MISCELLANEOUS) IMPLANT
SPONGE T-LAP 18X18 ~~LOC~~+RFID (SPONGE) ×2 IMPLANT
STRAP ANKLE FOOT DISTRACTOR (ORTHOPEDIC SUPPLIES) ×2 IMPLANT
STRIP CLOSURE SKIN 1/2X4 (GAUZE/BANDAGES/DRESSINGS) IMPLANT
SUCTION FRAZIER HANDLE 10FR (MISCELLANEOUS) ×2
SUCTION FRAZIER TIP 8 FR DISP (SUCTIONS) ×2
SUCTION TUBE FRAZIER 10FR DISP (MISCELLANEOUS) ×2 IMPLANT
SUCTION TUBE FRAZIER 8FR DISP (SUCTIONS) ×1 IMPLANT
SUT 3-0 VICRYL UNDYED CT1 36 (SUTURE) ×4
SUT ETHILON 2 0 FS 18 (SUTURE) ×4 IMPLANT
SUT ETHILON 3 0 PS 1 (SUTURE) ×2 IMPLANT
SUT MNCRL AB 3-0 PS2 18 (SUTURE) ×3 IMPLANT
SUT MNCRL AB 3-0 PS2 27 (SUTURE) ×2 IMPLANT
SUT PDS AB 2-0 CT1 27 (SUTURE) ×2 IMPLANT
SUT PDS AB 2-0 CT2 27 (SUTURE) ×2 IMPLANT
SUT VIC AB 2-0 CT1 27 (SUTURE) ×4
SUT VIC AB 2-0 CT1 TAPERPNT 27 (SUTURE) ×4 IMPLANT
SUT VIC AB 3-0 FS2 27 (SUTURE) IMPLANT
SUT VICRYL 2-0 COATED 36IN (SUTURE) ×2 IMPLANT
SUTURE 3-0 VICRYL UNDYD CT1 36 (SUTURE) ×2 IMPLANT
SYR 20ML LL LF (SYRINGE) ×2 IMPLANT
TOWEL GREEN STERILE (TOWEL DISPOSABLE) ×2 IMPLANT
TOWEL GREEN STERILE FF (TOWEL DISPOSABLE) ×2 IMPLANT
TUBE CONNECTING 12X1/4 (SUCTIONS) ×3 IMPLANT
TUBE CONNECTING 20X1/4 (TUBING) ×2 IMPLANT
TUBING ARTHROSCOPY IRRIG 16FT (MISCELLANEOUS) ×2 IMPLANT
UNDERPAD 30X36 HEAVY ABSORB (UNDERPADS AND DIAPERS) ×2 IMPLANT

## 2022-09-10 NOTE — Evaluation (Signed)
Physical Therapy Evaluation Patient Details Name: Tiffany Brown MRN: [id number] DOB: 02/06/1946 Today's Date: 09/10/2022  History of Present Illness  Patient is 77 y.o. female presents for surgery to address diagnosis of right equina cavovarus contracture. Pt now s/p extensive posterior medial capsular and tendinous release of right ankle and foot, Achilles lengthening, OR of talonavicular joint, and subtalar and talonavicular arthrodesis on 09/10/22. Patient now NWB on Rt LE. PMH significant for OA, depression.anxiety, fibromyalgia, GERD, lymphedema, HTN, Rt TKA.   Clinical Impression  Tiffany Brown is 77 y.o. female admitted with above HPI and diagnosis. Patient is currently limited by functional impairments below (see PT problem list). Patient lives with her husband and is significant limited at baseline due to Rt LE pain. Pt has been completing sit<>stand transfers with lift chair primarily and requires Max/Total assist for ADL's form her husband. Pt currently requires +2 Max assist for bed mobility and was unable to complete sit<>stand on with RW from elevated EOB. Pt greatly limited by NWB of Rt LE. Patient will benefit from continued skilled PT interventions to address impairments and progress independence with mobility. Acute PT will follow and progress as able.        Recommendations for follow up therapy are one component of a multi-disciplinary discharge planning process, led by the attending physician.  Recommendations may be updated based on patient status, additional functional criteria and insurance authorization.  Follow Up Recommendations Can patient physically be transported by private vehicle: No     Assistance Recommended at Discharge Frequent or constant Supervision/Assistance  Patient can return home with the following  Two people to help with walking and/or transfers;Two people to help with bathing/dressing/bathroom;Assistance with cooking/housework;Direct  supervision/assist for medications management;Assist for transportation;Help with stairs or ramp for entrance    Equipment Recommendations  (defer to facility)  Recommendations for Other Services       Functional Status Assessment Patient has had a recent decline in their functional status and/or demonstrates limited ability to make significant improvements in function in a reasonable and predictable amount of time     Precautions / Restrictions Precautions Precautions: Fall Restrictions Weight Bearing Restrictions: Yes RLE Weight Bearing: Non weight bearing      Mobility  Bed Mobility Overal bed mobility: Needs Assistance Bed Mobility: Supine to Sit, Sit to Supine, Rolling Rolling: Mod assist   Supine to sit: Max assist, +2 for physical assistance, +2 for safety/equipment, HOB elevated Sit to supine: Max assist, +2 for safety/equipment, HOB elevated, +2 for physical assistance        Transfers Overall transfer level: Needs assistance Equipment used: Rolling walker (2 wheels) Transfers: Sit to/from Stand Sit to Stand: Max assist, Total assist, +2 physical assistance, +2 safety/equipment, From elevated surface           General transfer comment: attempted sit<>stand x3 from slighlty elevated EOB. pt required max +2 for safety and physical assist to achieve partial lift off EOB. would need Total Assist to stand.    Ambulation/Gait                  Stairs            Wheelchair Mobility    Modified Rankin (Stroke Patients Only)       Balance Overall balance assessment: Needs assistance Sitting-balance support: Feet supported, Bilateral upper extremity supported Sitting balance-Leahy Scale: Fair       Standing balance-Leahy Scale: Zero  Pertinent Vitals/Pain Pain Assessment Pain Assessment: Faces Faces Pain Scale: Hurts a little bit Pain Location: Rt foot Pain Descriptors / Indicators:  Discomfort Pain Intervention(s): Limited activity within patient's tolerance, Monitored during session, Repositioned, Ice applied    Home Living Family/patient expects to be discharged to:: Private residence Living Arrangements: Spouse/significant other Available Help at Discharge: Family Type of Home: House Home Access: Stairs to enter Entrance Stairs-Rails: Left Entrance Stairs-Number of Steps: 3   Home Layout: One level Home Equipment: Agricultural consultant (2 wheels);Rollator (4 wheels);Shower seat;Wheelchair - manual;Grab bars - tub/shower (stand up rollator, lift chair)      Prior Function Prior Level of Function : Needs assist       Physical Assist : Mobility (physical);ADLs (physical) Mobility (physical): Gait;Transfers;Stairs;Bed mobility ADLs (physical): Grooming;Bathing;Dressing;Toileting;IADLs Mobility Comments: using WC for community, pt was walking with upright rollator in home but in the last several weeks/months pt has been unable to do more than sit<>stand with lift chair to change her brief. ADLs Comments: husband helps with dressing, bathing (sponge bath). he does all homemaking for at least the last year. pt has been using depends and voiding on herself then stands to remove and sit to don new brief from chair.     Hand Dominance   Dominant Hand: Right    Extremity/Trunk Assessment   Upper Extremity Assessment Upper Extremity Assessment: Defer to OT evaluation    Lower Extremity Assessment Lower Extremity Assessment: Generalized weakness;RLE deficits/detail;LLE deficits/detail RLE Deficits / Details: splint in place on Rt LE. edema and errythema noted superior to splint. LLE Deficits / Details: mild errythema and edema of lower Lt LE    Cervical / Trunk Assessment Cervical / Trunk Assessment: Other exceptions Cervical / Trunk Exceptions: habitus  Communication   Communication: No difficulties  Cognition Arousal/Alertness: Awake/alert Behavior During  Therapy: WFL for tasks assessed/performed Overall Cognitive Status: Within Functional Limits for tasks assessed                                          General Comments      Exercises     Assessment/Plan    PT Assessment Patient needs continued PT services  PT Problem List Decreased strength;Decreased activity tolerance;Decreased range of motion;Decreased balance;Decreased mobility;Decreased knowledge of use of DME;Decreased safety awareness;Decreased knowledge of precautions;Cardiopulmonary status limiting activity;Obesity;Decreased skin integrity;Pain;Impaired sensation       PT Treatment Interventions DME instruction;Patient/family education;Neuromuscular re-education;Balance training;Therapeutic exercise;Therapeutic activities;Functional mobility training;Stair training;Gait training    PT Goals (Current goals can be found in the Care Plan section)  Acute Rehab PT Goals Patient Stated Goal: regain mobility PT Goal Formulation: With patient Time For Goal Achievement: 09/24/22 Potential to Achieve Goals: Fair    Frequency Min 3X/week     Co-evaluation PT/OT/SLP Co-Evaluation/Treatment: Yes Reason for Co-Treatment: For patient/therapist safety;To address functional/ADL transfers PT goals addressed during session: Mobility/safety with mobility;Proper use of DME;Balance         AM-PAC PT "6 Clicks" Mobility  Outcome Measure Help needed turning from your back to your side while in a flat bed without using bedrails?: Total Help needed moving from lying on your back to sitting on the side of a flat bed without using bedrails?: Total Help needed moving to and from a bed to a chair (including a wheelchair)?: Total Help needed standing up from a chair using your arms (e.g., wheelchair or bedside chair)?: Total Help  needed to walk in hospital room?: Total Help needed climbing 3-5 steps with a railing? : Total 6 Click Score: 6    End of Session Equipment  Utilized During Treatment: Gait belt Activity Tolerance: Patient tolerated treatment well Patient left: in bed;with call bell/phone within reach;with bed alarm set Nurse Communication: Mobility status;Need for lift equipment PT Visit Diagnosis: Other abnormalities of gait and mobility (R26.89);Muscle weakness (generalized) (M62.81);Difficulty in walking, not elsewhere classified (R26.2);Pain Pain - Right/Left: Right Pain - part of body: Leg;Ankle and joints of foot    Time: 1444-1510 PT Time Calculation (min) (ACUTE ONLY): 26 min   Charges:   PT Evaluation $PT Eval Moderate Complexity: 1 Mod          Wynn Maudlin, DPT Acute Rehabilitation Services Office [phone]  09/10/22 3:35 PM

## 2022-09-10 NOTE — Anesthesia Procedure Notes (Signed)
Anesthesia Regional Block: Popliteal block   Pre-Anesthetic Checklist: , timeout performed,  Correct Patient, Correct Site, Correct Laterality,  Correct Procedure, Correct Position, site marked,  Risks and benefits discussed,  Surgical consent,  Pre-op evaluation,  At surgeon's request and post-op pain management  Laterality: Right  Prep: chloraprep       Needles:  Injection technique: Single-shot  Needle Type: Stimiplex     Needle Length: 9cm  Needle Gauge: 21     Additional Needles:   Procedures:,,,, ultrasound used (permanent image in chart),,    Narrative:  Start time: 09/10/2022 7:23 AM End time: 09/10/2022 7:28 AM Injection made incrementally with aspirations every 5 mL.  Performed by: Personally  Anesthesiologist: Lowella Curb, MD

## 2022-09-10 NOTE — Transfer of Care (Signed)
Immediate Anesthesia Transfer of Care Note  Patient: Tiffany Brown  Procedure(s) Performed: RIGHT EXTENSIVE MIDFOOT CAPSULOTOMY WITH POSTERIOR TIBIAL TENDON LENGTHENING AND ANKLE CAPSULAR RELEASE,  TIBIAL TALOCALCANEAL ARTHRODESIS VERSUS DOUBLE ARTHRODESIS (Right: Ankle)  Patient Location: PACU  Anesthesia Type:GA combined with regional for post-op pain  Level of Consciousness: awake and alert   Airway & Oxygen Therapy: Patient Spontanous Breathing and Patient connected to face mask oxygen  Post-op Assessment: Report given to RN and Post -op Vital signs reviewed and stable  Post vital signs: Reviewed and stable  Last Vitals:  Vitals Value Taken Time  BP    Temp    Pulse 94 09/10/22 1047  Resp 12 09/10/22 1047  SpO2 96 % 09/10/22 1047  Vitals shown include unvalidated device data.  Last Pain:  Vitals:   09/10/22 0644  TempSrc:   PainSc: 7          Complications: No notable events documented.

## 2022-09-10 NOTE — Anesthesia Preprocedure Evaluation (Addendum)
Anesthesia Evaluation  Patient identified by MRN, date of birth, ID band Patient awake    Reviewed: Allergy & Precautions, NPO status , Patient's Chart, lab work & pertinent test results  Airway Mallampati: III  TM Distance: >3 FB Neck ROM: Full    Dental  (+) Dental Advisory Given, Loose,    Pulmonary former smoker   Pulmonary exam normal breath sounds clear to auscultation       Cardiovascular hypertension, Pt. on medications Normal cardiovascular exam Rhythm:Regular Rate:Normal     Neuro/Psych  PSYCHIATRIC DISORDERS Anxiety Depression    negative neurological ROS     GI/Hepatic Neg liver ROS,GERD  Medicated and Controlled,,  Endo/Other  diabetes, Type 2  Morbid obesity (BMI 44)  Renal/GU negative Renal ROS  negative genitourinary   Musculoskeletal  (+) Arthritis , Osteoarthritis,  Fibromyalgia -  Abdominal  (+) + obese  Peds  Hematology negative hematology ROS (+)   Anesthesia Other Findings   Reproductive/Obstetrics                              Anesthesia Physical Anesthesia Plan  ASA: 3  Anesthesia Plan: General   Post-op Pain Management: Dilaudid IV   Induction: Intravenous  PONV Risk Score and Plan: 3 and Ondansetron, Dexamethasone, Treatment may vary due to age or medical condition and Midazolam  Airway Management Planned: LMA and Oral ETT  Additional Equipment:   Intra-op Plan:   Post-operative Plan: Extubation in OR  Informed Consent: I have reviewed the patients History and Physical, chart, labs and discussed the procedure including the risks, benefits and alternatives for the proposed anesthesia with the patient or authorized representative who has indicated his/her understanding and acceptance.     Dental advisory given  Plan Discussed with: CRNA  Anesthesia Plan Comments:          Anesthesia Quick Evaluation

## 2022-09-10 NOTE — H&P (Signed)
PREOPERATIVE H&P  Chief Complaint: Right equino cavovarus contracture  HPI: Tiffany Brown is a 77 y.o. female who presents for preoperative history and physical with a diagnosis of right equina cavovarus contracture.  She underwent podiatric surgery for apparent peroneal tendon repair and developed an equine cavovarus contracture postoperatively.  She then went for soft tissue release but had persistent deformity.  She does have active ankle dorsiflexion but has significant equinus and cavus as well as varus of her hindfoot and is not able to get her foot flat on the ground for walking.  She is here today for to correct the deformity.. Symptoms are rated as moderate to severe, and have been worsening.  This is significantly impairing activities of daily living.  She has elected for surgical management.   Past Medical History:  Diagnosis Date   Abnormal glucose    Arthritis    Depression with anxiety    Essential hypertension, benign    Fibromyalgia    GERD (gastroesophageal reflux disease)    Lymphedema of both lower extremities    and stomach   Melanoma (HCC)    left arm and right arm   Near syncope    Other and unspecified hyperlipidemia    Pre-diabetes    Past Surgical History:  Procedure Laterality Date   ABDOMINAL HYSTERECTOMY     BREAST SURGERY     LEFT   CHOLECYSTECTOMY     ELBOW SURGERY     X's 3 Left   IR ABLATE LIVER CRYOABLATION  02/15/2021   IR RADIOLOGIST EVAL & MGMT  02/08/2021   IR RADIOLOGIST EVAL & MGMT  03/14/2021   MELANOMA EXCISION Left 10/24/2021   Procedure: WIDE LOCAL EXCISION ADVANCEMENT FLAP CLOSURE LEFT UYPPER ARM MELANOMA;  Surgeon: Almond Lint, MD;  Location: [facility] OR;  Service: General;  Laterality: Left;   RIGHT DISTAL RADIUS FRACTURE     TOTAL KNEE ARTHROPLASTY     RIGHT   Social History   Socioeconomic History   Marital status: Married    Spouse name: jr   Number of children: 3   Years of education: 12   Highest education level: 12th grade   Occupational History   Occupation: Homemaker  Tobacco Use   Smoking status: Former    Packs/day: 0.80    Years: 25.00    Additional pack years: 0.00    Total pack years: 20.00    Types: Cigarettes    Quit date: 05/06/1994    Years since quitting: 28.3    Passive exposure: Past   Smokeless tobacco: Never  Vaping Use   Vaping Use: Never used  Substance and Sexual Activity   Alcohol use: Never   Drug use: Never   Sexual activity: Yes    Birth control/protection: Post-menopausal    Comment: Married since 1964  Other Topics Concern   Not on file  Social History Narrative   Has 3 children. Lives with husband. One daughter lives close by   Social Determinants of Health   Financial Resource Strain: Low Risk  (09/24/2021)   Overall Financial Resource Strain (CARDIA)    Difficulty of Paying Living Expenses: Not hard at all  Food Insecurity: No Food Insecurity (09/24/2021)   Hunger Vital Sign    Worried About Running Out of Food in the Last Year: Never true    Ran Out of Food in the Last Year: Never true  Transportation Needs: No Transportation Needs (09/24/2021)   PRAPARE - Administrator, Civil Service (Medical):  No    Lack of Transportation (Non-Medical): No  Physical Activity: Sufficiently Active (09/24/2021)   Exercise Vital Sign    Days of Exercise per Week: 7 days    Minutes of Exercise per Session: 40 min  Stress: No Stress Concern Present (09/24/2021)   [employer] of Occupational Health - Occupational Stress Questionnaire    Feeling of Stress : Only a little  Social Connections: Moderately Isolated (09/24/2021)   Social Connection and Isolation Panel [NHANES]    Frequency of Communication with Friends and Family: More than three times a week    Frequency of Social Gatherings with Friends and Family: Once a week    Attends Religious Services: Never    Database administrator or Organizations: No    Attends Engineer, structural: Never    Marital  Status: Married   Family History  Problem Relation Age of Onset   Cancer Mother        lymphoma   Suicidality Father    Lung cancer Sister    Heart disease Maternal Grandmother        no details   Breast cancer Neg Hx    No Known Allergies Prior to Admission medications   Medication Sig Start Date End Date Taking? Authorizing Provider  amLODipine (NORVASC) 5 MG tablet Take 1 tablet (5 mg total) by mouth daily. Patient taking differently: Take 5 mg by mouth at bedtime. 05/17/22  Yes Dettinger, Elige Radon, MD  buPROPion (WELLBUTRIN XL) 150 MG 24 hr tablet TAKE 1 TABLET BY MOUTH EVERY DAY Patient taking differently: Take 150 mg by mouth at bedtime. 07/15/22  Yes Dettinger, Elige Radon, MD  DULoxetine (CYMBALTA) 60 MG capsule Take 2 capsules (120 mg total) by mouth daily. Patient taking differently: Take 60 mg by mouth 2 (two) times daily. Morning and lunch 05/17/22  Yes Dettinger, Elige Radon, MD  fluticasone (FLONASE) 50 MCG/ACT nasal spray Place 2 sprays into both nostrils daily as needed for rhinitis. 05/17/22  Yes Dettinger, Elige Radon, MD  furosemide (LASIX) 40 MG tablet Take 1 tablet (40 mg total) by mouth 2 (two) times daily as needed. 05/17/22  Yes Dettinger, Elige Radon, MD  gabapentin (NEURONTIN) 300 MG capsule Take 1 capsule (300 mg total) by mouth 3 (three) times daily. 05/29/22  Yes Candelaria Stagers, DPM  ibuprofen (ADVIL) 800 MG tablet Take 1 tablet (800 mg total) by mouth every 6 (six) hours as needed. Patient taking differently: Take 800 mg by mouth every 6 (six) hours as needed for mild pain or moderate pain. 10/29/21  Yes Candelaria Stagers, DPM  losartan (COZAAR) 100 MG tablet Take 1 tablet (100 mg total) by mouth daily. 05/17/22  Yes Dettinger, Elige Radon, MD  pravastatin (PRAVACHOL) 80 MG tablet Take 1 tablet (80 mg total) by mouth daily. 05/17/22  Yes Dettinger, Elige Radon, MD  Semaglutide, 1 MG/DOSE, 4 MG/3ML SOPN Inject 1 mg as directed once a week. 05/17/22  Yes Dettinger, Elige Radon, MD  solifenacin  (VESICARE) 10 MG tablet Take 1 tablet (10 mg total) by mouth daily. 05/17/22  Yes Dettinger, Elige Radon, MD     Positive ROS: All other systems have been reviewed and were otherwise negative with the exception of those mentioned in the HPI and as above.  Physical Exam:  Vitals:   09/10/22 0602  BP: (!) 141/55  Pulse: 74  Resp: 20  Temp: 98.4 F (36.9 C)  SpO2: 96%   General: Alert, no acute distress Cardiovascular: No  pedal edema Respiratory: No cyanosis, no use of accessory musculature GI: No organomegaly, abdomen is soft and non-tender Skin: No lesions in the area of chief complaint Neurologic: Sensation intact distally Psychiatric: Patient is competent for consent with normal mood and affect Lymphatic: No axillary or cervical lymphadenopathy  MUSCULOSKELETAL: Right ankle demonstrates equinus and hindfoot varus with cavus.  She has significant lymphedema changes about her leg.  No significant swelling about the foot.  She endorses sensation light touch.  She does have active ankle dorsiflexion.  Assessment: Right equina cavovarus contracture   Plan: Plan for extensive posterior medial capsular releases with posterior tibial tendon release and subtalar arthrodesis versus subtalar and talonavicular joint arthrodesis.  She will be admitted for observation and prolonged postop recovery afterwards and may require SNF placement.  We discussed the risks, benefits and alternatives of surgery which include but are not limited to wound healing complications, infection, nonunion, malunion, need for further surgery, damage to surrounding structures and continued pain.  They understand there is no guarantees to an acceptable outcome.  After weighing these risks they opted to proceed with surgery.     Terance Hart, MD    09/10/2022 7:03 AM

## 2022-09-10 NOTE — Anesthesia Procedure Notes (Signed)
Procedure Name: Intubation Date/Time: 09/10/2022 7:37 AM  Performed by: Nadara Mustard, CRNAPre-anesthesia Checklist: Patient identified, Emergency Drugs available, Suction available and Patient being monitored Patient Re-evaluated:Patient Re-evaluated prior to induction Oxygen Delivery Method: Circle system utilized Preoxygenation: Pre-oxygenation with 100% oxygen Induction Type: IV induction Ventilation: Mask ventilation without difficulty Laryngoscope Size: Mac and 4 Grade View: Grade III Tube type: Oral Tube size: 7.0 mm Number of attempts: 1 Airway Equipment and Method: Stylet and Oral airway Placement Confirmation: ETT inserted through vocal cords under direct vision, positive ETCO2 and breath sounds checked- equal and bilateral Secured at: 21 cm Tube secured with: Tape Dental Injury: Teeth and Oropharynx as per pre-operative assessment

## 2022-09-10 NOTE — Anesthesia Postprocedure Evaluation (Signed)
Anesthesia Post Note  Patient: Tiffany Brown  Procedure(s) Performed: RIGHT EXTENSIVE MIDFOOT CAPSULOTOMY WITH POSTERIOR TIBIAL TENDON LENGTHENING AND ANKLE CAPSULAR RELEASE,  TIBIAL TALOCALCANEAL ARTHRODESIS VERSUS DOUBLE ARTHRODESIS (Right: Ankle)     Patient location during evaluation: PACU Anesthesia Type: General Level of consciousness: awake and alert Pain management: pain level controlled Vital Signs Assessment: post-procedure vital signs reviewed and stable Respiratory status: spontaneous breathing, nonlabored ventilation and respiratory function stable Cardiovascular status: blood pressure returned to baseline and stable Postop Assessment: no apparent nausea or vomiting Anesthetic complications: no   No notable events documented.  Last Vitals:  Vitals:   09/10/22 1145 09/10/22 1200  BP: (!) 120/49 (!) 109/56  Pulse: 78 77  Resp: 16 16  Temp:  36.4 C  SpO2: 93% 94%    Last Pain:  Vitals:   09/10/22 1200  TempSrc:   PainSc: Asleep                 Lowella Curb

## 2022-09-10 NOTE — Anesthesia Procedure Notes (Signed)
Anesthesia Regional Block: Adductor canal block   Pre-Anesthetic Checklist: , timeout performed,  Correct Patient, Correct Site, Correct Laterality,  Correct Procedure, Correct Position, site marked,  Risks and benefits discussed,  Surgical consent,  Pre-op evaluation,  At surgeon's request and post-op pain management  Laterality: Right  Prep: chloraprep       Needles:  Injection technique: Single-shot  Needle Type: Stimiplex     Needle Length: 9cm  Needle Gauge: 21     Additional Needles:   Procedures:,,,, ultrasound used (permanent image in chart),,    Narrative:  Start time: 09/10/2022 7:21 AM End time: 09/10/2022 7:26 AM Injection made incrementally with aspirations every 5 mL.  Performed by: Personally  Anesthesiologist: Lowella Curb, MD

## 2022-09-10 NOTE — Progress Notes (Signed)
Report received and care assumed. RN received in report Pt was due to void and bladder scanned at 338 ml PVR. Pt voided 100 ml. Awaiting orders for in and out cat  Contact precautions started for positive staph lab result. Pt education completed.

## 2022-09-10 NOTE — Evaluation (Signed)
Occupational Therapy Evaluation Patient Details Name: Tiffany Brown MRN: [id number] DOB: [birth year] Today's Date: 09/10/2022   History of Present Illness Patient is 77 y.o. female presents for surgery to address diagnosis of right equina cavovarus contracture. Pt now s/p extensive posterior medial capsular and tendinous release of right ankle and foot, Achilles lengthening, OR of talonavicular joint, and subtalar and talonavicular arthrodesis on 09/10/22. Patient now NWB on Rt LE. PMH significant for OA, depression.anxiety, fibromyalgia, GERD, lymphedema, HTN, Rt TKA.   Clinical Impression   Pt admitted with the above diagnosis. Pt currently with functional limitations due to the deficits listed below (see OT Problem List). Prior to admit, pt reports that for the past year she began to have more difficulty managing BADL tasks due to her right foot. She spent most of the time in her recliner with limited mobility. She used a manual w/c in the community and a Rolator in the home. Husband was able to assist with retrieving supplies that she needed to complete BADL tasks. Pt will benefit from acute skilled OT to increase their safety and independence with ADL and functional mobility for ADL to facilitate discharge. Patient will benefit from continued inpatient follow up therapy, <3 hours/day. OT will continue to follow patient acutely.         Recommendations for follow up therapy are one component of a multi-disciplinary discharge planning process, led by the attending physician.  Recommendations may be updated based on patient status, additional functional criteria and insurance authorization.   Assistance Recommended at Discharge Frequent or constant Supervision/Assistance  Patient can return home with the following Two people to help with walking and/or transfers;Two people to help with bathing/dressing/bathroom;Assistance with cooking/housework;Assist for transportation;Help with stairs or ramp for  entrance    Functional Status Assessment  Patient has had a recent decline in their functional status and demonstrates the ability to make significant improvements in function in a reasonable and predictable amount of time.  Equipment Recommendations  Other (comment) (TBD)       Precautions / Restrictions Precautions Precautions: Fall Restrictions Weight Bearing Restrictions: Yes RLE Weight Bearing: Non weight bearing      Mobility Bed Mobility Overal bed mobility: Needs Assistance Bed Mobility: Supine to Sit, Sit to Supine, Rolling Rolling: Mod assist   Supine to sit: Max assist, +2 for physical assistance, +2 for safety/equipment, HOB elevated Sit to supine: Max assist, +2 for safety/equipment, HOB elevated, +2 for physical assistance   General bed mobility comments: VC for hand placement, sequencing, and using bed rails to assist. Patient Response: Cooperative  Transfers Overall transfer level: Needs assistance Equipment used: Rolling walker (2 wheels) Transfers: Sit to/from Stand Sit to Stand: Max assist, Total assist, +2 physical assistance, +2 safety/equipment, From elevated surface           General transfer comment: attempted sit<>stand x3 from slighlty elevated EOB. pt required max +2 for safety and physical assist to achieve partial lift off EOB. would need Total Assist to stand.      Balance Overall balance assessment: Needs assistance Sitting-balance support: Bilateral upper extremity supported, Feet supported (LLE supported only) Sitting balance-Leahy Scale: Fair Sitting balance - Comments: sitting EOB     Standing balance-Leahy Scale: Zero Standing balance comment: Unable to achieve full standing position at EOB with 2 person assist.        ADL either performed or assessed with clinical judgement   ADL Overall ADL's : Needs assistance/impaired Eating/Feeding: Set up;Bed level   Grooming: Wash/dry hands;Wash/dry  face;Oral care;Applying  deodorant;Set up;Sitting   Upper Body Bathing: Minimal assistance;Sitting   Lower Body Bathing: Total assistance;Bed level;+2 for physical assistance   Upper Body Dressing : Minimal assistance;Sitting   Lower Body Dressing: Total assistance;+2 for physical assistance;Bed level     Toilet Transfer Details (indicate cue type and reason): unable to transfer safely to toilet at evaluation.         Vision Baseline Vision/History: 0 No visual deficits Ability to See in Adequate Light: 0 Adequate Patient Visual Report: No change from baseline Vision Assessment?: No apparent visual deficits            Pertinent Vitals/Pain Pain Assessment Pain Assessment: Faces Faces Pain Scale: Hurts a little bit Pain Location: Rt foot Pain Descriptors / Indicators: Discomfort, Grimacing Pain Intervention(s): Limited activity within patient's tolerance, Monitored during session, Repositioned, Ice applied     Hand Dominance Right   Extremity/Trunk Assessment Upper Extremity Assessment Upper Extremity Assessment: Generalized weakness   Lower Extremity Assessment Lower Extremity Assessment: Defer to PT evaluation RLE Deficits / Details: splint in place on Rt LE. edema and errythema noted superior to splint. LLE Deficits / Details: mild errythema and edema of lower Lt LE   Cervical / Trunk Assessment Cervical / Trunk Assessment: Other exceptions Cervical / Trunk Exceptions: habitus   Communication Communication Communication: No difficulties   Cognition Arousal/Alertness: Awake/alert Behavior During Therapy: WFL for tasks assessed/performed Overall Cognitive Status: Within Functional Limits for tasks assessed         General Comments  Splint and drain intact on RLE during session. On RA, pt SpO2 remained at mid-low 90's. Nasal canula was replaced at end of session.            Home Living Family/patient expects to be discharged to:: Private residence Living Arrangements:  Spouse/significant other Available Help at Discharge: Family;Available PRN/intermittently (Husband is not able to provide physical assistance.) Type of Home: House Home Access: Stairs to enter [employer] of Steps: 3 Entrance Stairs-Rails: Left Home Layout: One level     Bathroom Shower/Tub: Producer, television/film/video: Handicapped height Bathroom Accessibility: Yes   Home Equipment: Agricultural consultant (2 wheels);Rollator (4 wheels);Shower seat;Wheelchair - manual;Grab bars - tub/shower;Other (comment) (stand up rolator, lift chair)   Additional Comments: Pt reports that she slept in lift chair      Prior Functioning/Environment Prior Level of Function : Needs assist       Physical Assist : Mobility (physical);ADLs (physical) Mobility (physical): Gait;Transfers;Stairs;Bed mobility ADLs (physical): Grooming;Bathing;Dressing;Toileting;IADLs Mobility Comments: using WC for community, pt was walking with upright rollator in home but in the last several weeks/months pt has been unable to do more than sit<>stand with lift chair to change her brief. ADLs Comments: husband helps with dressing, bathing (sponge bath). he does all homemaking for at least the last year. pt has been using depends and voiding on herself then stands to remove and sit to don new brief from chair.        OT Problem List: Decreased strength;Pain;Decreased activity tolerance;Obesity;Decreased knowledge of use of DME or AE;Impaired balance (sitting and/or standing);Decreased knowledge of precautions      OT Treatment/Interventions: Self-care/ADL training;Therapeutic exercise;Therapeutic activities;Energy conservation;DME and/or AE instruction;Patient/family education;Balance training;Modalities;Manual therapy    OT Goals(Current goals can be found in the care plan section) Acute Rehab OT Goals Patient Stated Goal: none stated OT Goal Formulation: Patient unable to participate in goal setting Time For  Goal Achievement: 09/24/22 Potential to Achieve Goals: Good  OT Frequency:  Min 2X/week    Co-evaluation PT/OT/SLP Co-Evaluation/Treatment: Yes Reason for Co-Treatment: For patient/therapist safety;To address functional/ADL transfers PT goals addressed during session: Mobility/safety with mobility;Proper use of DME;Balance OT goals addressed during session: ADL's and self-care;Strengthening/ROM;Proper use of Adaptive equipment and DME      AM-PAC OT "6 Clicks" Daily Activity     Outcome Measure Help from another person eating meals?: A Little Help from another person taking care of personal grooming?: A Little Help from another person toileting, which includes using toliet, bedpan, or urinal?: Total Help from another person bathing (including washing, rinsing, drying)?: Total Help from another person to put on and taking off regular upper body clothing?: A Little Help from another person to put on and taking off regular lower body clothing?: Total 6 Click Score: 12   End of Session Equipment Utilized During Treatment: Gait belt;Rolling walker (2 wheels)  Activity Tolerance: Patient tolerated treatment well Patient left: in bed;with call bell/phone within reach;with bed alarm set  OT Visit Diagnosis: Unsteadiness on feet (R26.81);Muscle weakness (generalized) (M62.81)                Time: 1191-4782 OT Time Calculation (min): 24 min Charges:  OT General Charges $OT Visit: 1 Visit OT Evaluation $OT Eval Moderate Complexity: 1 Mod  [employer], OTR/L,CBIS  Supplemental OT - [facility] and [facility] Secure Chat Preferred    Ayinde Swim, Charisse March 09/10/2022, 3:54 PM

## 2022-09-10 NOTE — Op Note (Signed)
Tiffany Brown female 77 y.o. 09/10/2022  PreOperative Diagnosis: Right equina cavovarus contracture Right talonavicular joint dislocation Right subtalar joint arthrosis  PostOperative Diagnosis: Right equina cavovarus contracture Right talonavicular joint dislocation Right subtalar joint arthrosis   PROCEDURE: Extensive posterior medial capsular and tendinous release right ankle and foot Tendo Achilles lengthening Open reduction of talonavicular joint Subtalar joint arthrodesis Talonavicular joint arthrodesis   SURGEON: Dub Mikes, MD  ASSISTANT: Jesse Sw[place], PA-C was necessary for patient positioning, prep, drape, assistance with retraction and placement of hardware, closure and splinting  ANESTHESIA: General endotracheal tube anesthesia with peripheral nerve block  FINDINGS: See below  IMPLANTS: [employer] 8.0 mm partially-threaded cannulated screws, 5.0 mm partially-threaded cannulated screws  INDICATIONS:76 y.o. female underwent podiatric surgery for peroneal tendon issues and developed an equina cavovarus contracture.  She had subsequent posterior tibial tendon lengthening but had continued contracture and was unable to position her foot correctly in a brace.  She had significant pain and lateral overload and was developing varus through her ankle joint.  She had advanced imaging studies consistent with talonavicular joint dislocation and subtalar joint arthrosis.  She was indicated for surgery.   Patient understood the risks, benefits and alternatives to surgery which include but are not limited to wound healing complications, infection, nonunion, malunion, need for further surgery as well as damage to surrounding structures.  The patient had significant amount of venous stasis swelling and thickening of her skin bilaterally.  She understood in the setting of venous stasis ulcers the risk of postoperative swelling and the possibility of increased risk of  infection due to the swelling.  She also understood that if she fails the surgery there is a chance definitive surgery would require amputation.  They also understood the potential for continued pain in that there were no guarantees of acceptable outcome After weighing these risks the patient opted to proceed with surgery.  PROCEDURE: Patient was identified in the preoperative holding area.  The right leg was marked by myself.  Consent was signed by myself and the patient.  Block was performed by anesthesia in the preoperative holding area.  Patient was taken to the operative suite and placed supine on the operative table.  General endotracheal tube anesthesia was induced without difficulty. Bump was placed under the operative hip and bone foam was used.  All bony prominences were well padded.  Tourniquet was placed on the operative thigh.  Preoperative antibiotics were given. The extremity was prepped and draped in the usual sterile fashion and surgical timeout was performed.  The limb was elevated and the tourniquet was inflated to 250 mmHg.  We began by making a longitudinal incision overlying the previously made medial incision.  There is significant soft tissue contracture including skin and subcutaneous tissue in this area.  We are able to sharply dissect down to the underlying scar tissue.  The scar tissue was released medially from the distal aspect of the navicular proximally along the posterior aspect of the medial malleolus.  Blunt dissection as well as sharp dissection was then used to mobilize the scarred skin, subcutaneous tissue gain access to the underlying structures.  We encountered the posterior tibial tendon.  There is evidence of the suture material within the sheath and within the tendon.  The posterior tibial tendon was released and allowed to retract.  A 2 to 3 cm section of the posterior tibial tendon was completely removed and discarded.  Below this was the flexor digitorum longus  tendon that was also  encountered and was significantly contracted.  The tendon was released and allowed to retract.  We found the capsular tissue to the talonavicular joint and the dorsally, medially and plantarly.  This was released sharply with a 15 blade.  The talonavicular joint was significantly subluxated medially and plantarly.  We then proceeded to release the deltoid ligament and the spring ligament structures from the medial malleolus down to the navicular bone.  This allowed for more mobilization of the talonavicular joint.  We then proceeded to continue along to the joint capsular tissue medially along the medial malleolus and deltoid ligament complex superficially.  We were able to release the medial and posterior medial aspect of the ankle capsular tissue.  After these releases it was noted that she had an equinus contracture.  The subtalar joint and ankle joint were able to be position better in a more valgus than neutral position and the talonavicular joint was further mobilized.  We also performed release of the capsular and plantar based fibrous and scar tissue along the talonavicular joint and along the course of the medial cuneiform and plantar aspect of the talar head.  After release of these tissues were able to position her foot and more neutral and valgus position through the hindfoot and transverse tarsal joints.  We then proceeded with tendo Achilles lengthening.  With the ankle and hindfoot held in a maximally dorsiflexed position an 11 blade was used to perform a percutaneous tendo Achilles lengthening and a triple hemisection fashion.  This was done to release the tendon and we are able to gain ankle range of motion past neutral dorsiflexion after the release.  This was done in the medial and lateral to medial direction.  We then turned our attention to reducing the talonavicular joint.  A separate incision was made along the dorsal aspect of the talonavicular joint approximately 5  to 6 cm from the medial incision was sharply carried through skin and subcutaneous tissue.  The tibialis anterior tendon was identified and retracted medially.  Then the incision was carried down to the talonavicular joint.  The talonavicular joint capsular tissue was identified.  It was incised in line with the joint transversely.  Then the talonavicular joint was reduced under direct visualization and held provisionally with K wire fixation.  The subtalar joint remained in slight valgus position and had a propensity to fall into valgus and therefore decision was made for subtalar arthrodesis.  We made a longitudinal incision overlying the previously made lateral incision along the course of the peroneal tendons.  It was carried sharply through skin and subcutaneous tissue.  Blunt dissection was used to mobilize skin flaps and the peroneal tendons were identified.  There is suture material within the tendons and they were retracted and protected.  The sinus Tarsi was identified and a subtalar joint arthrotomy was performed to gain access to the subtalar joint.  Then a rondure was used to clear out any fibrous tissue and scarred and fatty tissue within the sinus Tarsi to gain access.  A lamina spreader was then placed within the joint and the interosseous ligaments were transected sharply.  Then using an osteotome and a curette the subtalar joint posterior facet and medial facet were removed of any cartilage.  Once adequate cartilage was removed the bony surfaces were trephinated.  Care was taken to appropriate position the subtalar joint and then 2 K wires were placed from the dorsal aspect of the talar neck across the posterior facet of the subtalar  joint and a second wire from the lateral aspect of the calcaneus in a retrograde fashion into the talar body.  This stabilized the subtalar joint and position was acceptable clinically and radiographically.  We then proceeded to place two 8 mm partially-threaded  cannulated screws to stabilize the subtalar joint.  We then proceeded to prepare the talonavicular joint for arthrodesis.  The wire holding the joint reduced was removed.  A curette, osteotome and rondure was used to remove the cartilage surfaces from the distal aspect of the talar head and the proximal aspect of the navicular bone.  Once the cartilage was removed a drill was used to trephinate the bony surfaces.  She had very soft bone.  We then proceeded to stabilize and reduce the talonavicular joint.   The talonavicular joint was then translated laterally and dorsally to correct the forefoot position.  We noted persistent stiffness about the calcaneocuboid joint therefore we turned our attention the lateral joint and the calcaneocuboid joint was identified through the lateral incision and a capsular release was performed of the calcaneocuboid joint to aid with better reduction of the talonavicular joint for arthrodesis.  Two K wires were placed from the medial pole of the navicular across the talonavicular joint and the second from the dorsal aspect of the navicular across the talonavicular joint.  Then once fluoroscopy confirmed appropriate position of the joint 2 partially-threaded cannulated screws were placed across the talonavicular joint to stabilize it for arthrodesis purposes.  Then remaining K wires were removed.  Final fluoroscopic images were obtained.  There is good position of the subtalar joint talonavicular joint and clinically the foot was better position overall.  The ankle was in a neutral position.  We are able to get the foot past neutral dorsiflexion passively.  Then a JP drain was placed in the lateral incision and run out the lateral aspect of the foot through a stab incision.  Then the wounds were copiously irrigated with normal saline.  Vancomycin powder was placed in all 3 wounds.  The tourniquet was released.  Then the wounds were closed in a layered fashion using 2-0 PDS, 3-0  Monocryl, 2-0 nylon and 3-0 nylon.  There was a decent amount of tension on the medial wound but the skin edges did approximate well.  She was placed in a soft dressing.  Short leg splint was placed.  She was awakened from anesthesia and taken recovery in stable condition.  No complications.  Counts were correct.   POST OPERATIVE INSTRUCTIONS: Nonweightbearing to operative extremity. Keep splint intact and dry She will be brought in for extended postoperative recovery and will likely need SNF placement  TOURNIQUET TIME: Less than 2 hours  BLOOD LOSS:  200 mL         DRAINS: none         SPECIMEN: none       COMPLICATIONS:  * No complications entered in OR log *         Disposition: PACU - hemodynamically stable.         Condition: stable

## 2022-09-11 ENCOUNTER — Encounter (HOSPITAL_COMMUNITY): Payer: Self-pay | Admitting: Orthopaedic Surgery

## 2022-09-11 LAB — CBC WITH DIFFERENTIAL/PLATELET
Abs Immature Granulocytes: 0.07 10*3/uL (ref 0.00–0.07)
Basophils Absolute: 0 10*3/uL (ref 0.0–0.1)
Basophils Relative: 0 %
Eosinophils Absolute: 0.1 10*3/uL (ref 0.0–0.5)
Eosinophils Relative: 1 %
HCT: 28.5 % — ABNORMAL LOW (ref 36.0–46.0)
Hemoglobin: 9.8 g/dL — ABNORMAL LOW (ref 12.0–15.0)
Immature Granulocytes: 0 %
Lymphocytes Relative: 15 %
Lymphs Abs: 2.4 10*3/uL (ref 0.7–4.0)
MCH: 32.5 pg (ref 26.0–34.0)
MCHC: 34.4 g/dL (ref 30.0–36.0)
MCV: 94.4 fL (ref 80.0–100.0)
Monocytes Absolute: 1.5 10*3/uL — ABNORMAL HIGH (ref 0.1–1.0)
Monocytes Relative: 10 %
Neutro Abs: 11.7 10*3/uL — ABNORMAL HIGH (ref 1.7–7.7)
Neutrophils Relative %: 74 %
Platelets: 164 10*3/uL (ref 150–400)
RBC: 3.02 MIL/uL — ABNORMAL LOW (ref 3.87–5.11)
RDW: 14.6 % (ref 11.5–15.5)
WBC: 15.9 10*3/uL — ABNORMAL HIGH (ref 4.0–10.5)
nRBC: 0 % (ref 0.0–0.2)

## 2022-09-11 NOTE — Social Work (Signed)
Please be advised that the above-named patient will require a short-term nursing home stay-anticipated 30 days or less for rehabilitation and strengthening. The plan is for return home.  

## 2022-09-11 NOTE — TOC Initial Note (Addendum)
Transition of Care [facility]) - Initial/Assessment Note    Patient Details  Name: Tiffany Brown MRN: [id number] Date of Birth: 16-Oct-1945  Transition of Care [facility]) CM/SW Contact:    Carley Hammed, LCSW Phone Number: 09/11/2022, 10:32 AM  Clinical Narrative:                 CSW spoke with pt and advised of SNF recommendation. Pt is agreeable as she notes her spouse has had issues for caring for her at home. Pt notes her mobility started becoming an issue about a year and a half ago, due to her foot pain.  Pt notes she has received PT in [place] prior. Unsure if this was SNF or OPPT. CSW explained this would be in a facility, pt agreeable to a short term stay. Pt would prefer a facility in [place]. Pt's Passr pending review, will upload when cosigned by MD. TOC will continue to follow for DC needs.    PASRR documentation uploaded. Expected Discharge Plan: Skilled Nursing Facility Barriers to Discharge: Continued Medical Work up, English as a second language teacher, SNF Pending bed offer   Patient Goals and CMS Choice Patient states their goals for this hospitalization and ongoing recovery are:: Pt wants to be able to regain some independence. CMS Medicare.gov Compare Post Acute Care list provided to:: Patient Choice offered to / list presented to : Patient      Expected Discharge Plan and Services In-house Referral: Clinical Social Work   Post Acute Care Choice: Skilled Nursing Facility Living arrangements for the past 2 months: Single Family Home                                      Prior Living Arrangements/Services Living arrangements for the past 2 months: Single Family Home Lives with:: Spouse Patient language and need for interpreter reviewed:: Yes Do you feel safe going back to the place where you live?: Yes      Need for Family Participation in Patient Care: Yes (Comment) Care giver support system in place?: Yes (comment) Current home services: DME Criminal  Activity/Legal Involvement Pertinent to Current Situation/Hospitalization: No - Comment as needed  Activities of Daily Living Home Assistive Devices/Equipment: Bedside commode/3-in-1 ADL Screening (condition at time of admission) Patient's cognitive ability adequate to safely complete daily activities?: Yes Is the patient deaf or have difficulty hearing?: No Does the patient have difficulty seeing, even when wearing glasses/contacts?: No Does the patient have difficulty concentrating, remembering, or making decisions?: No Patient able to express need for assistance with ADLs?: Yes Does the patient have difficulty dressing or bathing?: Yes Independently performs ADLs?: No Communication: Independent Dressing (OT): Appropriate for developmental age Grooming: Independent Feeding: Independent Bathing: Appropriate for developmental age Toileting: Independent In/Out Bed: Independent Walks in Home: Independent with device (comment) Does the patient have difficulty walking or climbing stairs?: Yes Weakness of Legs: Both Weakness of Arms/Hands: Both  Permission Sought/Granted Permission sought to share information with : Family Supports Permission granted to share information with : Yes, Verbal Permission Granted  Share Information with NAME: Junior Dahlen     Permission granted to share info w Relationship: Spouse     Emotional Assessment Appearance:: Appears stated age Attitude/Demeanor/Rapport: Engaged Affect (typically observed): Appropriate Orientation: : Oriented to Self, Oriented to Place, Oriented to  Time, Oriented to Situation Alcohol / Substance Use: Not Applicable Psych Involvement: No (comment)  Admission diagnosis:  Right foot pain [  M79.671] Patient Active Problem List   Diagnosis Date Noted   Right foot pain 09/10/2022   Stasis dermatitis of both legs 12/24/2018   Degenerative disc disease, lumbar 12/24/2018   Fibromyalgia 12/24/2018   Vitamin D deficiency  12/24/2018   Class 3 severe obesity with serious comorbidity and body mass index (BMI) of 40.0 to 44.9 in adult (HCC) 07/23/2017   Closed fracture of right inferior pubic ramus (HCC) 11/18/2016   Gastroesophageal reflux disease 06/06/2016   Recurrent major depressive disorder, in full remission (HCC) 06/06/2016   DM type 2 with diabetic dyslipidemia (HCC) 03/06/2016   Edema 09/14/2013   Precordial pain 08/20/2011   Essential hypertension, benign    Hyperlipidemia    PCP:  Dettinger, Elige Radon, MD Pharmacy:   [phone] - [place], [place] - [address] [place] [address] [place] [address] Phone: [phone] Fax: [phone]  [id number] - [place] [place] - [address] [place] [address] Phone: [phone] Fax: [phone]     Social Determinants of Health (SDOH) Social History: SDOH Screenings   Food Insecurity: No Food Insecurity (09/24/2021)  Housing: Low Risk  (09/24/2021)  Transportation Needs: No Transportation Needs (09/24/2021)  Alcohol Screen: Low Risk  (09/24/2021)  Depression (PHQ2-9): High Risk (08/16/2022)  Financial Resource Strain: Low Risk  (09/24/2021)  Physical Activity: Sufficiently Active (09/24/2021)  Social Connections: Moderately Isolated (09/24/2021)  Stress: No Stress Concern Present (09/24/2021)  Tobacco Use: Medium Risk (09/10/2022)   SDOH Interventions:     Readmission Risk Interventions     No data to display

## 2022-09-11 NOTE — Progress Notes (Signed)
     Tiffany Brown is a 77 y.o. female   Orthopaedic diagnosis:postop day 1 status post double arthrodesis, tendo Achilles lengthening and medial foot capsular release, extensive  Subjective: Patient is resting comfortably.  Denies significant pain.  Denies issues with the drain.  She notes her sensation in her foot has returned.  Has not worked with physical therapy.  Objectyive: Vitals:   09/11/22 0056 09/11/22 0515  BP: (!) 114/47 (!) 118/41  Pulse: 98 87  Resp: 16 16  Temp: 98 F (36.7 C) 99.4 F (37.4 C)  SpO2: 95% 92%     Exam: Awake and alert Respirations even and unlabored No acute distress  Right foot demonstrates a splint in place.  Exposed forefoot is warm and well perfused.  Splint is well positioned.  Drain with some drainage noted.  She endorses sensation to her toes.  Assessment: Postop day 1 status post above, doing well   Plan: Continue nonweightbearing for right lower extremity.  We will keep the drain in place for now and may remove it prior to discharge.  She will work with physical therapy today and likely need SNF placement.  Continue aspirin for DVT prophylaxis Finish postoperative antibiotics Regular diet.   Nicki [place], MD

## 2022-09-11 NOTE — NC FL2 (Signed)
[place] MEDICAID FL2 LEVEL OF CARE FORM     IDENTIFICATION  Patient Name: Tiffany Brown Birthdate: 07/25/1945 Sex: female Admission Date (Current Location): 09/10/2022  [facility] and [place] Number:  Producer, television/film/video and Address:  [facility] [place], [address] [place], [place] [address]      Provider Number: [id number]  Attending Physician Name and Address:  Terance Hart, MD  Relative Name and Phone Number:  Junior Llanas  [phone]    Current Level of Care: Hospital Recommended Level of Care: Skilled Nursing Facility Prior Approval Number:    Date Approved/Denied:   PASRR Number: Pending  Discharge Plan: SNF    Current Diagnoses: Patient Active Problem List   Diagnosis Date Noted   Right foot pain 09/10/2022   Stasis dermatitis of both legs 12/24/2018   Degenerative disc disease, lumbar 12/24/2018   Fibromyalgia 12/24/2018   Vitamin D deficiency 12/24/2018   Class 3 severe obesity with serious comorbidity and body mass index (BMI) of 40.0 to 44.9 in adult [facility]) 07/23/2017   Closed fracture of right inferior pubic ramus (HCC) 11/18/2016   Gastroesophageal reflux disease 06/06/2016   Recurrent major depressive disorder, in full remission (HCC) 06/06/2016   DM type 2 with diabetic dyslipidemia (HCC) 03/06/2016   Edema 09/14/2013   Precordial pain 08/20/2011   Essential hypertension, benign    Hyperlipidemia     Orientation RESPIRATION BLADDER Height & Weight     Self, Time, Situation, Place  O2 ([place] 2L) Continent Weight: 275 lb (124.7 kg) Height:  5\' 5"  (165.1 cm)  BEHAVIORAL SYMPTOMS/MOOD NEUROLOGICAL BOWEL NUTRITION STATUS      Continent Diet (See DC summary)  AMBULATORY STATUS COMMUNICATION OF NEEDS Skin   Extensive Assist Verbally Surgical wounds (R foot incision)                       Personal Care Assistance Level of Assistance  Bathing, Dressing, Feeding Bathing Assistance: Maximum assistance Feeding  assistance: Limited assistance Dressing Assistance: Maximum assistance     Functional Limitations Info  Sight, Hearing, Speech Sight Info: Adequate Hearing Info: Adequate Speech Info: Adequate    SPECIAL CARE FACTORS FREQUENCY  PT (By licensed PT), OT (By licensed OT)     PT Frequency: 5X week OT Frequency: 5x week            Contractures Contractures Info: Not present    Additional Factors Info  Code Status, Allergies, Psychotropic Code Status Info: Full Allergies Info: NKA Psychotropic Info: Bupropion, Duloxetine         Current Medications (09/11/2022):  This is the current hospital active medication list Current Facility-Administered Medications  Medication Dose Route Frequency Provider Last Rate Last Admin   acetaminophen (TYLENOL) tablet 325-650 mg  325-650 mg Oral Q6H PRN [place], Jesse J, PA-C   650 mg at 09/11/22 0522   amLODipine (NORVASC) tablet 5 mg  5 mg Oral QHS [place], Jesse J, [place]   5 mg at 09/10/22 2151   aspirin tablet 325 mg  325 mg Oral Daily [place], Jesse J, PA-C       buPROPion (WELLBUTRIN XL) 24 hr tablet 150 mg  150 mg Oral QHS [place], Jesse J, [place]   150 mg at 09/10/22 2151   Chlorhexidine Gluconate Cloth 2 % PADS 6 each  6 each Topical Q0600 Terance Hart, MD   6 each at 09/11/22 0224   diphenhydrAMINE (BENADRYL) 12.5 MG/5ML elixir 12.5-25 mg  12.5-25  mg Oral Q4H PRN [place], Jesse J, PA-C       docusate sodium (COLACE) capsule 100 mg  100 mg Oral BID [place], Jesse J, PA-C   100 mg at 09/10/22 2151   DULoxetine (CYMBALTA) DR capsule 60 mg  60 mg Oral BID [place], Jesse J, [place]   60 mg at 09/10/22 2151   fesoterodine (TOVIAZ) tablet 4 mg  4 mg Oral Daily [place], Jesse J, PA-C       furosemide (LASIX) tablet 40 mg  40 mg Oral BID PRN [place], Jesse J, PA-C       gabapentin (NEURONTIN) capsule 300 mg  300 mg Oral TID [place], Jesse J, PA-C   300 mg at 09/10/22 2151   HYDROcodone-acetaminophen (NORCO) 7.5-325 MG per tablet 1-2 tablet  1-2 tablet  Oral Q4H PRN [place], Jesse J, PA-C       HYDROcodone-acetaminophen (NORCO/VICODIN) 5-325 MG per tablet 1-2 tablet  1-2 tablet Oral Q4H PRN [place], Jesse J, PA-C   2 tablet at 09/11/22 0152   losartan (COZAAR) tablet 100 mg  100 mg Oral Daily [place], Jesse J, PA-C   100 mg at 09/10/22 1510   methocarbamol (ROBAXIN) tablet 500 mg  500 mg Oral Q6H PRN [place], Jesse J, PA-C       Or   methocarbamol (ROBAXIN) 500 mg in dextrose 5 % 50 mL IVPB  500 mg Intravenous Q6H PRN [place], Jesse J, PA-C       metoCLOPramide (REGLAN) tablet 5-10 mg  5-10 mg Oral Q8H PRN [place], Jesse J, PA-C       Or   metoCLOPramide (REGLAN) injection 5-10 mg  5-10 mg Intravenous Q8H PRN [place], Jesse J, PA-C       morphine (PF) 2 MG/ML injection 0.5-1 mg  0.5-1 mg Intravenous Q2H PRN [place], Jesse J, PA-C       mupirocin ointment (BACTROBAN) 2 % 1 Application  1 Application Nasal BID Terance Hart, MD   1 Application at 09/10/22 2330   naproxen (NAPROSYN) tablet 250 mg  250 mg Oral BID WC [place], Jesse J, PA-C   250 mg at 09/10/22 1800   ondansetron (ZOFRAN) tablet 4 mg  4 mg Oral Q6H PRN [place], Jesse J, PA-C       Or   ondansetron [place]) injection 4 mg  4 mg Intravenous Q6H PRN [place], Jesse J, PA-C       pravastatin (PRAVACHOL) tablet 80 mg  80 mg Oral Daily [place], Jesse J, [place]         Discharge Medications: Please see discharge summary for a list of discharge medications.  Relevant Imaging Results:  Relevant Lab Results:   Additional Information SS# [id number], [place]

## 2022-09-11 NOTE — Telephone Encounter (Signed)
Received notification from [employer] regarding approval for [employer]. Patient assistance approved from 09/11/22 to 05/06/23.  Medication will ship to office in 10-14 business days  Phone: [phone]

## 2022-09-12 DIAGNOSIS — Z8249 Family history of ischemic heart disease and other diseases of the circulatory system: Secondary | ICD-10-CM | POA: Diagnosis not present

## 2022-09-12 DIAGNOSIS — F419 Anxiety disorder, unspecified: Secondary | ICD-10-CM | POA: Diagnosis present

## 2022-09-12 DIAGNOSIS — M79671 Pain in right foot: Secondary | ICD-10-CM | POA: Diagnosis present

## 2022-09-12 DIAGNOSIS — Z87891 Personal history of nicotine dependence: Secondary | ICD-10-CM | POA: Diagnosis not present

## 2022-09-12 DIAGNOSIS — S9304XA Dislocation of right ankle joint, initial encounter: Secondary | ICD-10-CM | POA: Diagnosis present

## 2022-09-12 DIAGNOSIS — Z96651 Presence of right artificial knee joint: Secondary | ICD-10-CM | POA: Diagnosis present

## 2022-09-12 DIAGNOSIS — M797 Fibromyalgia: Secondary | ICD-10-CM | POA: Diagnosis present

## 2022-09-12 DIAGNOSIS — F32A Depression, unspecified: Secondary | ICD-10-CM | POA: Diagnosis present

## 2022-09-12 DIAGNOSIS — Z7985 Long-term (current) use of injectable non-insulin antidiabetic drugs: Secondary | ICD-10-CM | POA: Diagnosis not present

## 2022-09-12 DIAGNOSIS — Z801 Family history of malignant neoplasm of trachea, bronchus and lung: Secondary | ICD-10-CM | POA: Diagnosis not present

## 2022-09-12 DIAGNOSIS — Z807 Family history of other malignant neoplasms of lymphoid, hematopoietic and related tissues: Secondary | ICD-10-CM | POA: Diagnosis not present

## 2022-09-12 DIAGNOSIS — E785 Hyperlipidemia, unspecified: Secondary | ICD-10-CM | POA: Diagnosis present

## 2022-09-12 DIAGNOSIS — I10 Essential (primary) hypertension: Secondary | ICD-10-CM | POA: Diagnosis present

## 2022-09-12 DIAGNOSIS — M19071 Primary osteoarthritis, right ankle and foot: Secondary | ICD-10-CM | POA: Diagnosis present

## 2022-09-12 DIAGNOSIS — E119 Type 2 diabetes mellitus without complications: Secondary | ICD-10-CM | POA: Diagnosis present

## 2022-09-12 DIAGNOSIS — Z6841 Body Mass Index (BMI) 40.0 and over, adult: Secondary | ICD-10-CM | POA: Diagnosis not present

## 2022-09-12 DIAGNOSIS — M62471 Contracture of muscle, right ankle and foot: Secondary | ICD-10-CM | POA: Diagnosis present

## 2022-09-12 DIAGNOSIS — M9689 Other intraoperative and postprocedural complications and disorders of the musculoskeletal system: Secondary | ICD-10-CM | POA: Diagnosis present

## 2022-09-12 DIAGNOSIS — I890 Lymphedema, not elsewhere classified: Secondary | ICD-10-CM | POA: Diagnosis present

## 2022-09-12 DIAGNOSIS — R339 Retention of urine, unspecified: Secondary | ICD-10-CM | POA: Diagnosis not present

## 2022-09-12 DIAGNOSIS — Z9049 Acquired absence of other specified parts of digestive tract: Secondary | ICD-10-CM | POA: Diagnosis not present

## 2022-09-12 DIAGNOSIS — Z85820 Personal history of malignant melanoma of skin: Secondary | ICD-10-CM | POA: Diagnosis not present

## 2022-09-12 DIAGNOSIS — Z79899 Other long term (current) drug therapy: Secondary | ICD-10-CM | POA: Diagnosis not present

## 2022-09-12 MED ORDER — HYDROCODONE-ACETAMINOPHEN 5-325 MG PO TABS: ORAL_TABLET | ORAL | 0 refills | Status: DC

## 2022-09-12 MED ORDER — TAMSULOSIN HCL 0.4 MG PO CAPS
0.4000 mg | ORAL_CAPSULE | Freq: Every day | ORAL | Status: DC
  Administered 2022-09-12 – 2022-09-13 (×2): 0.4 mg via ORAL
  Filled 2022-09-12 (×2): qty 1

## 2022-09-12 NOTE — TOC Progression Note (Addendum)
Transition of Care [facility]) - Progression Note    Patient Details  Name: AIKA DEPIES MRN: [id number] Date of Birth: 04/24/1946  Transition of Care [facility]) CM/SW Contact  Carley Hammed, LCSW Phone Number: 09/12/2022, 11:12 AM  Clinical Narrative:    CSW provided bed offers to pt, CSW to discuss with pt after she reviews. PASRR approved [id number] E. TOC will continue to follow for bed choice and to start insurance auth.   Pt states she has chosen [employer], CSW notified facility and started authorization. TOC will continue to follow for DC needs.  Expected Discharge Plan: Skilled Nursing Facility Barriers to Discharge: Continued Medical Work up, English as a second language teacher, SNF Pending bed offer  Expected Discharge Plan and Services In-house Referral: Clinical Social Work   Post Acute Care Choice: Skilled Nursing Facility Living arrangements for the past 2 months: Single Family Home                                       Social Determinants of Health (SDOH) Interventions SDOH Screenings   Food Insecurity: No Food Insecurity (09/24/2021)  Housing: Low Risk  (09/24/2021)  Transportation Needs: No Transportation Needs (09/24/2021)  Alcohol Screen: Low Risk  (09/24/2021)  Depression (PHQ2-9): High Risk (08/16/2022)  Financial Resource Strain: Low Risk  (09/24/2021)  Physical Activity: Sufficiently Active (09/24/2021)  Social Connections: Moderately Isolated (09/24/2021)  Stress: No Stress Concern Present (09/24/2021)  Tobacco Use: Medium Risk (09/11/2022)    Readmission Risk Interventions     No data to display

## 2022-09-12 NOTE — Progress Notes (Addendum)
Physical Therapy Treatment Patient Details Name: Tiffany Brown MRN: [id number] DOB: Feb 08, 1946 Today's Date: 09/12/2022   History of Present Illness Patient is 77 y.o. female presents for surgery to address diagnosis of right equina cavovarus contracture. Pt now s/p extensive posterior medial capsular and tendinous release of right ankle and foot, Achilles lengthening, OR of talonavicular joint, and subtalar and talonavicular arthrodesis on 09/10/22. Patient now NWB on Rt LE. PMH significant for OA, depression.anxiety, fibromyalgia, GERD, lymphedema, HTN, Rt TKA.    PT Comments    Patient agreeable to PT session. Spouse at the bedside. The patient required only one person assistance today for bed mobility. She continues to be limited by generalized weakness and is fatigued with minimal activity. Incremental scooting performed with maximal assistance with frequent rest breaks required. Unable to stand at this time with one person assistance. Recommend to continue PT to maximize independence and decrease caregiver burden. Anticipate the need for frequent supervision/assistance required at discharge. Spouse is hopeful that patient can get to a rehab facility soon.    Recommendations for follow up therapy are one component of a multi-disciplinary discharge planning process, led by the attending physician.  Recommendations may be updated based on patient status, additional functional criteria and insurance authorization.  Follow Up Recommendations  Can patient physically be transported by private vehicle: No    Assistance Recommended at Discharge Frequent or constant Supervision/Assistance  Patient can return home with the following Two people to help with walking and/or transfers;Two people to help with bathing/dressing/bathroom;Assistance with cooking/housework;Direct supervision/assist for medications management;Assist for transportation;Help with stairs or ramp for entrance   Equipment  Recommendations   (to be determined at next level of care)    Recommendations for Other Services       Precautions / Restrictions Precautions Precautions: Fall Restrictions Weight Bearing Restrictions: Yes RLE Weight Bearing: Non weight bearing     Mobility  Bed Mobility Overal bed mobility: Needs Assistance Bed Mobility: Supine to Sit, Sit to Supine     Supine to sit: Max assist, HOB elevated Sit to supine: Max assist, HOB elevated   General bed mobility comments: verbal cues for sequencing and technique. assistance for BLE and trunk support. increased time and effort required with heavy use of bed rails    Transfers Overall transfer level: Needs assistance   Transfers: Bed to chair/wheelchair/BSC            Lateral/Scoot Transfers: Max assist General transfer comment: With verbal cues and maximal assistance, patient performed incremental lateral scooting transfer to the right (2 bouts of 3 scoots) with frequent rest breaks required. Cues for technique/positioning of right leg to maintain NWB. The patient is unable to stand while maintaining NWB of RLE and is fatigued with minimal activity    Ambulation/Gait               General Gait Details: unable to stand this date   Stairs             Wheelchair Mobility    Modified Rankin (Stroke Patients Only)       Balance Overall balance assessment: Needs assistance Sitting-balance support: Bilateral upper extremity supported Sitting balance-Leahy Scale: Fair Sitting balance - Comments: eyes closed periodically with reported mild dizziness initially with sitting upright and with position changes                                    Cognition  Arousal/Alertness: Awake/alert Behavior During Therapy: WFL for tasks assessed/performed Overall Cognitive Status: Within Functional Limits for tasks assessed                                          Exercises      General  Comments General comments (skin integrity, edema, etc.): one supine in bed, patient is able to assist with repositioning using BUE support on overhead bed rail with bed in trendelenburg position      Pertinent Vitals/Pain Pain Assessment Pain Assessment: 0-10 Pain Score: 6  Pain Location: right foot Pain Descriptors / Indicators: Discomfort, Grimacing, Sore Pain Intervention(s): Limited activity within patient's tolerance, Monitored during session, Premedicated before session, Repositioned (polar care re-applied at end of session)    Home Living                          Prior Function            PT Goals (current goals can now be found in the care plan section) Acute Rehab PT Goals Patient Stated Goal: regain mobility PT Goal Formulation: With patient Time For Goal Achievement: 09/24/22 Potential to Achieve Goals: Fair Progress towards PT goals: Progressing toward goals    Frequency    Min 3X/week      PT Plan Current plan remains appropriate    Co-evaluation              AM-PAC PT "6 Clicks" Mobility   Outcome Measure  Help needed turning from your back to your side while in a flat bed without using bedrails?: A Lot Help needed moving from lying on your back to sitting on the side of a flat bed without using bedrails?: A Lot Help needed moving to and from a bed to a chair (including a wheelchair)?: A Lot Help needed standing up from a chair using your arms (e.g., wheelchair or bedside chair)?: Total Help needed to walk in hospital room?: Total Help needed climbing 3-5 steps with a railing? : Total 6 Click Score: 9    End of Session Equipment Utilized During Treatment: Oxygen Activity Tolerance: Patient limited by fatigue Patient left: in bed;with call bell/phone within reach;with bed alarm set Nurse Communication: Mobility status PT Visit Diagnosis: Other abnormalities of gait and mobility (R26.89);Muscle weakness (generalized)  (M62.81);Difficulty in walking, not elsewhere classified (R26.2);Pain Pain - Right/Left: Right Pain - part of body: Leg;Ankle and joints of foot     Time: 1610-9604 PT Time Calculation (min) (ACUTE ONLY): 20 min  Charges:  $Therapeutic Activity: 8-22 mins                     Donna Bernard, PT, MPT    Tiffany Brown 09/12/2022, 10:27 AM

## 2022-09-12 NOTE — Progress Notes (Addendum)
     Tiffany Brown is a 77 y.o. female   Orthopaedic diagnosis:postop day 1 status post double arthrodesis, tendo Achilles lengthening and medial foot capsular release, extensive  Subjective: Patient appears comfortable in bed.  Family at bedside.  Pain controlled on current regimen.  She has developed urinary retention postoperatively over the last 24 hours.  She required 2 in and out caths.  She did not have any history of urinary retention preoperatively.  Reports uneventful bowel movement.  She is working with physical therapy.  SNF placement pending.  Notes output from drain.  Objectyive: Vitals:   09/12/22 0621 09/12/22 0825  BP: (!) 114/45 (!) 106/46  Pulse: 80 78  Resp: 17 17  Temp: 98.3 F (36.8 C) 98.1 F (36.7 C)  SpO2: 93% 91%     Exam: Awake and alert Respirations even and unlabored No acute distress  Right lower extremity demonstrates well-fitted, clean, dry lower extremity splint.  Drain in place with output noted.  Splint was not removed.  Exposed skin is benign.  She is able to wiggle the toes.  Endorses intact sensation light touch in the toes.  Proximal calf soft nontender.  Tolerates gentle range of motion at the knee.  Toes are warm and well-perfused distally.  Assessment: Postop day 2 status post above, doing well Postoperative urinary retention  Plan: Foley catheter ordered if she remains unable to void.  This was discussed with nursing staff.  Will touch base with urology to determine next steps.  We will plan to keep drain in place until discharge.  -Nonweightbearing right lower extremity in splint -Keep splint clean, dry, and intact -Pain control as needed.  She is tolerating Norco well and patient.  Prescription printed and signed for Norco to take to SNF. -Continue aspirin for DVT prophylaxis x 30 days postoperatively -Continue PT/OT while in house   Suitable for discharge when SNF placement obtained.  Plan for outpatient follow-up 2 weeks from  surgery for splint removal, suture removal appropriate, and radiographs.  Likely placement of a cast at that time.  Plan for nonweightbearing 6 to 8 weeks.  Addendum: Discussed urinary retention with Dr. Benancio Deeds with [facility].  Recommends adding Flomax and discharge with Foley in place. Outpatient follow up 1 week at [facility] [phone]) for further evaluation and management.     Tiffany Dyches J. [place], PA-C

## 2022-09-12 NOTE — Progress Notes (Signed)
Occupational Therapy Treatment Patient Details Name: Tiffany Brown MRN: [id number] DOB: 1946-03-27 Today's Date: 09/12/2022   History of present illness Patient is 77 y.o. female presents for surgery to address diagnosis of right equina cavovarus contracture. Pt now s/p extensive posterior medial capsular and tendinous release of right ankle and foot, Achilles lengthening, OR of talonavicular joint, and subtalar and talonavicular arthrodesis on 09/10/22. Patient now NWB on Rt LE. PMH significant for OA, depression.anxiety, fibromyalgia, GERD, lymphedema, HTN, Rt TKA.   OT comments  Pt motivated to improve function, limited by fatigue and weakness. Pt attempted to use Stedy today, unable to use, decreased strength in LLE, NWB RLE, very tired. Pt able to perform exercises at EOB, increased need for rest breaks in between each set of 10, 30 reps total per exercise. Pt SOB but able to tolerate sitting EOB unsupported as needed. Pt assisted back to bed, help for BLEs mod A bed mobility. Pt would greatly benefit from continued skilled therapy to improve endurance and overall strength.   Recommendations for follow up therapy are one component of a multi-disciplinary discharge planning process, led by the attending physician.  Recommendations may be updated based on patient status, additional functional criteria and insurance authorization.    Assistance Recommended at Discharge Frequent or constant Supervision/Assistance  Patient can return home with the following  Two people to help with walking and/or transfers;Two people to help with bathing/dressing/bathroom;Assistance with cooking/housework;Assist for transportation;Help with stairs or ramp for entrance   Equipment Recommendations  Other (comment) (defer)    Recommendations for Other Services      Precautions / Restrictions Precautions Precautions: Fall Restrictions Weight Bearing Restrictions: Yes RLE Weight Bearing: Non weight bearing        Mobility Bed Mobility Overal bed mobility: Needs Assistance Bed Mobility: Supine to Sit, Sit to Supine Rolling: Mod assist   Supine to sit: Mod assist, HOB elevated Sit to supine: Mod assist, HOB elevated   General bed mobility comments: increased time, assistance with BLEs, HHA for sitting up    Transfers Overall transfer level: Needs assistance Equipment used: Rolling walker (2 wheels) Transfers: Sit to/from Stand Sit to Stand: Max assist, +2 physical assistance, +2 safety/equipment           General transfer comment: attempted with use of STEDY, unable to complete sit to stand, decreased strength in LLE, NWB RLE.     Balance                                           ADL either performed or assessed with clinical judgement   ADL                                              Extremity/Trunk Assessment Upper Extremity Assessment Upper Extremity Assessment: Generalized weakness            Vision       Perception     Praxis      Cognition Arousal/Alertness: Awake/alert Behavior During Therapy: WFL for tasks assessed/performed Overall Cognitive Status: Within Functional Limits for tasks assessed  Exercises Exercises: General Upper Extremity General Exercises - Upper Extremity Shoulder Flexion: AROM, Both, 20 reps, Theraband Theraband Level (Shoulder Flexion): Level 2 (Red) Shoulder Horizontal ABduction: AROM, 20 reps, Theraband Theraband Level (Shoulder Horizontal Abduction): Level 2 (Red) Elbow Flexion: AROM, 20 reps, Theraband Theraband Level (Elbow Flexion): Level 2 (Red) Elbow Extension: AROM, 20 reps, Theraband Theraband Level (Elbow Extension): Level 2 (Red)    Shoulder Instructions       General Comments      Pertinent Vitals/ Pain       Pain Assessment Pain Assessment: 0-10 Pain Score: 5  Faces Pain Scale: Hurts little more Pain  Descriptors / Indicators: Discomfort, Grimacing, Sore Pain Intervention(s): Monitored during session  Home Living                                          Prior Functioning/Environment              Frequency  Min 2X/week        Progress Toward Goals  OT Goals(current goals can now be found in the care plan section)  Progress towards OT goals: Progressing toward goals  Acute Rehab OT Goals Patient Stated Goal: to improve overall strength OT Goal Formulation: With patient Time For Goal Achievement: 09/24/22 Potential to Achieve Goals: Good ADL Goals Pt Will Perform Grooming: with modified independence;standing Pt Will Perform Upper Body Bathing: with set-up;sitting Pt Will Transfer to Toilet: with mod assist;stand pivot transfer;bedside commode Pt Will Perform Toileting - Clothing Manipulation and hygiene: with min guard assist;sitting/lateral leans;sit to/from stand Pt/caregiver will Perform Home Exercise Program: Increased strength;Both right and left upper extremity;With theraband;Independently;With written HEP provided Additional ADL Goal #1: Pt will complete sit to stand transition with Min guard utilizing RW in order to progress out of bed activity and participate in self care task.  Plan Discharge plan remains appropriate    Co-evaluation                 AM-PAC OT "6 Clicks" Daily Activity     Outcome Measure   Help from another person eating meals?: A Little Help from another person taking care of personal grooming?: A Little Help from another person toileting, which includes using toliet, bedpan, or urinal?: Total Help from another person bathing (including washing, rinsing, drying)?: Total Help from another person to put on and taking off regular upper body clothing?: A Little Help from another person to put on and taking off regular lower body clothing?: Total 6 Click Score: 12    End of Session    OT Visit Diagnosis:  Unsteadiness on feet (R26.81);Muscle weakness (generalized) (M62.81)   Activity Tolerance Patient tolerated treatment well   Patient Left in bed;with bed alarm set;with call bell/phone within reach   Nurse Communication Mobility status        Time: 0981-1914 OT Time Calculation (min): 24 min  Charges: OT General Charges $OT Visit: 1 Visit OT Treatments $Therapeutic Activity: 8-22 mins $Therapeutic Exercise: 8-22 mins  General Wearing, OTR/L   Alexis Goodell 09/12/2022, 4:26 PM

## 2022-09-12 NOTE — Progress Notes (Signed)
Per report, pt has not voided and had about 200s mL of urine. Bladder Scan at 2130 shows 645 mL. Pt attempted to urinate but unable to . On call provider notified, who requested for the pt have straight  cath every 6 hrs PRN. At 2250 removed 1000 mL of amber, clear urine. At 645. Bladder Scan shows 333 mL of urine. In and Out Cath performed and  removed 300 mL of amber, clear urine.

## 2022-09-13 DIAGNOSIS — Z9889 Other specified postprocedural states: Secondary | ICD-10-CM | POA: Diagnosis not present

## 2022-09-13 DIAGNOSIS — F32A Depression, unspecified: Secondary | ICD-10-CM | POA: Diagnosis not present

## 2022-09-13 DIAGNOSIS — Z0000 Encounter for general adult medical examination without abnormal findings: Secondary | ICD-10-CM | POA: Diagnosis not present

## 2022-09-13 DIAGNOSIS — M25571 Pain in right ankle and joints of right foot: Secondary | ICD-10-CM | POA: Diagnosis not present

## 2022-09-13 DIAGNOSIS — E785 Hyperlipidemia, unspecified: Secondary | ICD-10-CM | POA: Diagnosis not present

## 2022-09-13 DIAGNOSIS — Z743 Need for continuous supervision: Secondary | ICD-10-CM | POA: Diagnosis not present

## 2022-09-13 DIAGNOSIS — Z4889 Encounter for other specified surgical aftercare: Secondary | ICD-10-CM | POA: Diagnosis not present

## 2022-09-13 DIAGNOSIS — M24574 Contracture, right foot: Secondary | ICD-10-CM | POA: Diagnosis not present

## 2022-09-13 DIAGNOSIS — M7989 Other specified soft tissue disorders: Secondary | ICD-10-CM | POA: Diagnosis not present

## 2022-09-13 DIAGNOSIS — Z960 Presence of urogenital implants: Secondary | ICD-10-CM | POA: Diagnosis not present

## 2022-09-13 DIAGNOSIS — R339 Retention of urine, unspecified: Secondary | ICD-10-CM | POA: Diagnosis not present

## 2022-09-13 DIAGNOSIS — G4700 Insomnia, unspecified: Secondary | ICD-10-CM | POA: Diagnosis not present

## 2022-09-13 DIAGNOSIS — M6281 Muscle weakness (generalized): Secondary | ICD-10-CM | POA: Diagnosis not present

## 2022-09-13 DIAGNOSIS — R52 Pain, unspecified: Secondary | ICD-10-CM | POA: Diagnosis not present

## 2022-09-13 DIAGNOSIS — R338 Other retention of urine: Secondary | ICD-10-CM | POA: Diagnosis not present

## 2022-09-13 DIAGNOSIS — R109 Unspecified abdominal pain: Secondary | ICD-10-CM | POA: Diagnosis not present

## 2022-09-13 DIAGNOSIS — M79605 Pain in left leg: Secondary | ICD-10-CM | POA: Diagnosis not present

## 2022-09-13 DIAGNOSIS — M1712 Unilateral primary osteoarthritis, left knee: Secondary | ICD-10-CM | POA: Diagnosis not present

## 2022-09-13 DIAGNOSIS — M79662 Pain in left lower leg: Secondary | ICD-10-CM | POA: Diagnosis not present

## 2022-09-13 DIAGNOSIS — I890 Lymphedema, not elsewhere classified: Secondary | ICD-10-CM | POA: Diagnosis not present

## 2022-09-13 DIAGNOSIS — N390 Urinary tract infection, site not specified: Secondary | ICD-10-CM | POA: Diagnosis not present

## 2022-09-13 DIAGNOSIS — Z4789 Encounter for other orthopedic aftercare: Secondary | ICD-10-CM | POA: Diagnosis not present

## 2022-09-13 DIAGNOSIS — I10 Essential (primary) hypertension: Secondary | ICD-10-CM | POA: Diagnosis not present

## 2022-09-13 DIAGNOSIS — M797 Fibromyalgia: Secondary | ICD-10-CM | POA: Diagnosis not present

## 2022-09-13 DIAGNOSIS — B964 Proteus (mirabilis) (morganii) as the cause of diseases classified elsewhere: Secondary | ICD-10-CM | POA: Diagnosis not present

## 2022-09-13 DIAGNOSIS — M170 Bilateral primary osteoarthritis of knee: Secondary | ICD-10-CM | POA: Diagnosis not present

## 2022-09-13 DIAGNOSIS — E119 Type 2 diabetes mellitus without complications: Secondary | ICD-10-CM | POA: Diagnosis not present

## 2022-09-13 DIAGNOSIS — E7849 Other hyperlipidemia: Secondary | ICD-10-CM | POA: Diagnosis not present

## 2022-09-13 DIAGNOSIS — M79671 Pain in right foot: Secondary | ICD-10-CM | POA: Diagnosis not present

## 2022-09-13 DIAGNOSIS — K219 Gastro-esophageal reflux disease without esophagitis: Secondary | ICD-10-CM | POA: Diagnosis not present

## 2022-09-13 DIAGNOSIS — Z7982 Long term (current) use of aspirin: Secondary | ICD-10-CM | POA: Diagnosis not present

## 2022-09-13 DIAGNOSIS — Z96661 Presence of right artificial ankle joint: Secondary | ICD-10-CM | POA: Diagnosis not present

## 2022-09-13 DIAGNOSIS — D649 Anemia, unspecified: Secondary | ICD-10-CM | POA: Diagnosis not present

## 2022-09-13 DIAGNOSIS — Z471 Aftercare following joint replacement surgery: Secondary | ICD-10-CM | POA: Diagnosis not present

## 2022-09-13 DIAGNOSIS — R6889 Other general symptoms and signs: Secondary | ICD-10-CM | POA: Diagnosis not present

## 2022-09-13 DIAGNOSIS — Z7401 Bed confinement status: Secondary | ICD-10-CM | POA: Diagnosis not present

## 2022-09-13 DIAGNOSIS — M19071 Primary osteoarthritis, right ankle and foot: Secondary | ICD-10-CM | POA: Diagnosis not present

## 2022-09-13 DIAGNOSIS — K5900 Constipation, unspecified: Secondary | ICD-10-CM | POA: Diagnosis not present

## 2022-09-13 DIAGNOSIS — I82492 Acute embolism and thrombosis of other specified deep vein of left lower extremity: Secondary | ICD-10-CM | POA: Diagnosis not present

## 2022-09-13 LAB — CBC
HCT: 25.6 % — ABNORMAL LOW (ref 36.0–46.0)
Hemoglobin: 8.5 g/dL — ABNORMAL LOW (ref 12.0–15.0)
MCH: 31.4 pg (ref 26.0–34.0)
MCHC: 33.2 g/dL (ref 30.0–36.0)
MCV: 94.5 fL (ref 80.0–100.0)
Platelets: 134 10*3/uL — ABNORMAL LOW (ref 150–400)
RBC: 2.71 MIL/uL — ABNORMAL LOW (ref 3.87–5.11)
RDW: 14.6 % (ref 11.5–15.5)
WBC: 10.9 10*3/uL — ABNORMAL HIGH (ref 4.0–10.5)
nRBC: 0 % (ref 0.0–0.2)

## 2022-09-13 MED ORDER — ASPIRIN 325 MG PO TABS: ORAL_TABLET | ORAL | 0 refills | Status: DC

## 2022-09-13 NOTE — Discharge Summary (Signed)
Patient ID: Tiffany Brown MRN: [id number] DOB/AGE: 1945/12/05 77 y.o.  Admit date: 09/10/2022 Discharge date: 09/13/2022  Admission Diagnoses:  Principal Problem:   Right foot pain   Discharge Diagnoses:  Same  Past Medical History:  Diagnosis Date   Abnormal glucose    Arthritis    Depression with anxiety    Essential hypertension, benign    Fibromyalgia    GERD (gastroesophageal reflux disease)    Lymphedema of both lower extremities    and stomach   Melanoma (HCC)    left arm and right arm   Near syncope    Other and unspecified hyperlipidemia    Pre-diabetes     Surgeries: Procedure(s): RIGHT EXTENSIVE MIDFOOT CAPSULOTOMY WITH POSTERIOR TIBIAL TENDON LENGTHENING AND ANKLE CAPSULAR RELEASE,  TIBIAL TALOCALCANEAL ARTHRODESIS VERSUS DOUBLE ARTHRODESIS on 09/10/2022   Consultants:   Discharged Condition: Improved  Hospital Course: Tiffany Brown is an 77 y.o. female who was admitted 09/10/2022 for operative treatment ofRight foot pain. Patient has severe unremitting pain that affects sleep, daily activities, and work/hobbies. After pre-op clearance the patient was taken to the operating room on 09/10/2022 and underwent  Procedure(s): RIGHT EXTENSIVE MIDFOOT CAPSULOTOMY WITH POSTERIOR TIBIAL TENDON LENGTHENING AND ANKLE CAPSULAR RELEASE,  TIBIAL TALOCALCANEAL ARTHRODESIS VERSUS DOUBLE ARTHRODESIS.    Patient was given perioperative antibiotics:  Anti-infectives (From admission, onward)    Start     Dose/Rate Route Frequency Ordered Stop   09/10/22 1415  ceFAZolin (ANCEF) IVPB 2g/100 mL premix        2 g 200 mL/hr over 30 Minutes Intravenous Every 6 hours 09/10/22 1329 09/11/22 0300   09/10/22 0950  vancomycin (VANCOCIN) powder  Status:  Discontinued          As needed 09/10/22 0956 09/10/22 1040   09/10/22 0606  ceFAZolin (ANCEF) 3-0.9 GM/100ML-% IVPB       Note to Pharmacy: Kathrene Bongo D: cabinet override      09/10/22 0606 09/10/22 0753   09/10/22 0600   ceFAZolin (ANCEF) IVPB 3g/100 mL premix        3 g 200 mL/hr over 30 Minutes Intravenous On call to O.R. 09/10/22 0559 09/10/22 0745        Patient was given sequential compression devices, early ambulation, and chemoprophylaxis to prevent DVT.  Patient benefited maximally from hospital stay and there were no complications.    Recent vital signs: Patient Vitals for the past 24 hrs:  BP Temp Temp src Pulse Resp SpO2  09/13/22 0814 (!) 108/45 98.7 F (37.1 C) Oral 78 17 92 %  09/13/22 0422 (!) 117/44 98.2 F (36.8 C) Oral 82 18 95 %  09/12/22 2039 (!) 101/45 98.3 F (36.8 C) Oral 79 18 96 %  09/12/22 1822 (!) 106/46 98.3 F (36.8 C) Oral 82 16 92 %     Recent laboratory studies:  Recent Labs    09/11/22 0039 09/13/22 0324  WBC 15.9* 10.9*  HGB 9.8* 8.5*  HCT 28.5* 25.6*  PLT 164 134*     Discharge Medications:   Allergies as of 09/13/2022   No Known Allergies      Medication List     TAKE these medications    amLODipine 5 MG tablet Commonly known as: NORVASC Take 1 tablet (5 mg total) by mouth daily. What changed: when to take this   aspirin 325 MG tablet Take 1 tablet by mouth for 30 days for blood clot prevention Start taking on: Sep 14, 2022   buPROPion 150  MG 24 hr tablet Commonly known as: WELLBUTRIN XL TAKE 1 TABLET BY MOUTH EVERY DAY What changed: when to take this   DULoxetine 60 MG capsule Commonly known as: CYMBALTA Take 2 capsules (120 mg total) by mouth daily. What changed:  how much to take when to take this additional instructions   fluticasone 50 MCG/ACT nasal spray Commonly known as: FLONASE Place 2 sprays into both nostrils daily as needed for rhinitis.   furosemide 40 MG tablet Commonly known as: LASIX Take 1 tablet (40 mg total) by mouth 2 (two) times daily as needed.   gabapentin 300 MG capsule Commonly known as: NEURONTIN Take 1 capsule (300 mg total) by mouth 3 (three) times daily.   HYDROcodone-acetaminophen 5-325 MG  tablet Commonly known as: NORCO/VICODIN Take 1 tablet by mouth every 4-6 hours as needed for pain   ibuprofen 800 MG tablet Commonly known as: ADVIL Take 1 tablet (800 mg total) by mouth every 6 (six) hours as needed. What changed: reasons to take this   losartan 100 MG tablet Commonly known as: COZAAR Take 1 tablet (100 mg total) by mouth daily.   pravastatin 80 MG tablet Commonly known as: PRAVACHOL Take 1 tablet (80 mg total) by mouth daily.   Semaglutide (1 MG/DOSE) 4 MG/3ML Sopn Inject 1 mg as directed once a week.   solifenacin 10 MG tablet Commonly known as: VESICARE Take 1 tablet (10 mg total) by mouth daily.               Durable Medical Equipment  (From admission, onward)           Start     Ordered   09/10/22 1330  DME 3 n 1  Once        09/10/22 1329              Discharge Care Instructions  (From admission, onward)           Start     Ordered   09/13/22 0000  Non weight bearing       Question Answer Comment  Laterality right   Extremity Lower      09/13/22 1100            Diagnostic Studies: DG Ankle Complete Right  Result Date: 09/10/2022 CLINICAL DATA:  Right ankle ORIF EXAM: RIGHT ANKLE - COMPLETE 3+ VIEW COMPARISON:  CT examination dated August 21, 2022 FINDINGS: Multiple intraoperative fluoroscopic images for arthrodesis at subtalar and talonavicular joints. Degenerative changes of the ankle joint with osteophytes. Total fluoroscopic time was 43 seconds and fluoroscopic dose was 1.53 mGy. IMPRESSION: Intraoperative fluoroscopic images for arthrodesis at the subtalar and talonavicular joints. Electronically Signed   By: Larose Hires D.O.   On: 09/10/2022 13:23   DG C-Arm 1-60 Min-No Report  Result Date: 09/10/2022 Fluoroscopy was utilized by the requesting physician.  No radiographic interpretation.   DG C-Arm 1-60 Min-No Report  Result Date: 09/10/2022 Fluoroscopy was utilized by the requesting physician.  No radiographic  interpretation.   CT ANKLE RIGHT WO CONTRAST  Result Date: 08/25/2022 CLINICAL DATA:  Right ankle pain.  Preoperative evaluation. EXAM: CT OF THE RIGHT ANKLE WITHOUT CONTRAST TECHNIQUE: Multidetector CT imaging of the right ankle was performed according to the standard protocol. Multiplanar CT image reconstructions were also generated. RADIATION DOSE REDUCTION: This exam was performed according to the departmental dose-optimization program which includes automated exposure control, adjustment of the mA and/or kV according to patient size and/or use of iterative reconstruction  technique. COMPARISON:  MR ankle 09/23/2021 FINDINGS: Bones/Joint/Cartilage Generalized osteopenia. No acute fracture or dislocation. Severe joint space narrowing of the medial tibiotalar joint consistent with osteoarthritis. Mild osteoarthritis of the posterior subtalar joint. Moderate osteoarthritis of the talonavicular joint. Mild osteoarthritis of the calcaneocuboid joint. Mild osteoarthritis of the second, third, fourth and fifth tarsometatarsal joints. No joint effusion. Ligaments Ligaments are suboptimally evaluated by CT. Muscles and Tendons Muscles are normal. No muscle atrophy. No intramuscular fluid collection or hematoma. Flexor, extensor and Achilles tendons are intact. Peroneus longus tendon is thickened at the level of the lateral malleolus concerning for tendinosis and postsurgical changes with the distal portion attenuated concerning for a high-grade partial tear. Soft tissue No fluid collection or hematoma. No soft tissue mass. Soft tissue edema overlying the medial and lateral malleolus. Surgical clips in the medial soft tissues. IMPRESSION: 1. Severe osteoarthritis of the medial tibiotalar joint. 2. Mild osteoarthritis of the posterior subtalar joint. 3. Moderate osteoarthritis of the talonavicular joint. 4. Mild osteoarthritis of the calcaneocuboid joint. 5. Mild osteoarthritis of the second, third, fourth and fifth  tarsometatarsal joints. 6. Peroneus longus tendon is thickened at the level of the lateral malleolus concerning for tendinosis and postsurgical changes with the distal portion attenuated concerning for a high-grade partial tear. Electronically Signed   By: Elige Ko M.D.   On: 08/25/2022 07:39    Disposition: Discharge disposition: 03-Skilled Nursing Facility      Postoperative urinary retention - Keep Foley in place.  Flomax started.  Follow-up 1 week with [facility]    -Nonweightbearing right lower extremity in splint -Keep splint clean, dry, and intact -Pain control as needed.  She is tolerating Norco well and patient.  Prescription printed and signed for Norco to take to SNF. -Continue aspirin for DVT prophylaxis x 30 days postoperatively -Continue PT/OT while in house    Plan for outpatient follow-up 2 weeks from surgery for splint removal, suture removal appropriate, and radiographs.  Likely placement of a cast at that time.  Plan for nonweightbearing 6 to 8 weeks   Discharge Instructions     Call MD / Call 911   Complete by: As directed    If you experience chest pain or shortness of breath, CALL 911 and be transported to the hospital emergency room.  If you develope a fever above 101 F, pus (white drainage) or increased drainage or redness at the wound, or calf pain, call your surgeon's office.   Constipation Prevention   Complete by: As directed    Drink plenty of fluids.  Prune juice may be helpful.  You may use a stool softener, such as Colace (over the counter) 100 mg twice a day.  Use MiraLax (over the counter) for constipation as needed.   Diet - low sodium heart healthy   Complete by: As directed    Non weight bearing   Complete by: As directed    Laterality: right   Extremity: Lower   Post-operative opioid taper instructions:   Complete by: As directed    POST-OPERATIVE OPIOID TAPER INSTRUCTIONS: It is important to wean off of your opioid medication as soon  as possible. If you do not need pain medication after your surgery it is ok to stop day one. Opioids include: Codeine, Hydrocodone(Norco, Vicodin), Oxycodone(Percocet, oxycontin) and hydromorphone amongst others.  Long term and even short term use of opiods can cause: Increased pain response Dependence Constipation Depression Respiratory depression And more.  Withdrawal symptoms can include Flu like symptoms Nausea,  vomiting And more Techniques to manage these symptoms Hydrate well Eat regular healthy meals Stay active Use relaxation techniques(deep breathing, meditating, yoga) Do Not substitute Alcohol to help with tapering If you have been on opioids for less than two weeks and do not have pain than it is ok to stop all together.  Plan to wean off of opioids This plan should start within one week post op of your joint replacement. Maintain the same interval or time between taking each dose and first decrease the dose.  Cut the total daily intake of opioids by one tablet each day Next start to increase the time between doses. The last dose that should be eliminated is the evening dose.           Contact information for follow-up providers     Terance Hart, MD Follow up in 2 week(s).   Specialty: Orthopedic Surgery Contact information: [address] [place] [address] [phone]         [facility] Follow up in 1 week(s).   Why: Call [phone] for appointment Contact information: [address] [place] [address] [place] [address] [phone]             Contact information for after-discharge care     Destination     [facility] .   Service: Skilled Nursing Contact information: [address] [place] [address] [phone]                      Signed: Kaori Jumper J [place] 09/13/2022, 11:01 AM

## 2022-09-13 NOTE — Progress Notes (Signed)
     Tiffany Brown is a 77 y.o. female   Orthopaedic diagnosis:postop day 1 status post double arthrodesis, tendo Achilles lengthening and medial foot capsular release, extensive  Subjective: Patient resting comfortably.  She is ready for discharge to SNF.  Foley in place.  Does complain of some pain right lower extremity mostly in the thigh region.  Drain output has slowed.  Eating and drinking well.  No bowel movement yet.  No abdominal pain.  Hemoglobin down to 8.5 today but she states she feels well.  Objectyive: Vitals:   09/12/22 2039 09/13/22 0422  BP: (!) 101/45 (!) 117/44  Pulse: 79 82  Resp: 18 18  Temp: 98.3 F (36.8 C) 98.2 F (36.8 C)  SpO2: 96% 95%      Exam: Awake and alert Respirations even and unlabored No acute distress   Right lower extremity demonstrates well-fitted, clean, dry lower extremity splint.  Drain was removed uneventfully.  Splint was not removed.  Exposed skin is benign.  She is able to wiggle the toes.  Endorses intact sensation light touch in the toes.  Proximal calf soft nontender.  Tolerates gentle range of motion at the knee.  Toes are warm and well-perfused distally.   Assessment: Postop day 3 status post above, doing well Postoperative urinary retention - Keep Foley in place.  Flomax started.  Follow-up 1 week with [facility]    Plan:  -Nonweightbearing right lower extremity in splint -Keep splint clean, dry, and intact -Pain control as needed.  She is tolerating Norco well and patient.  Prescription printed and signed for Norco to take to SNF. -Continue aspirin for DVT prophylaxis x 30 days postoperatively -Continue PT/OT while in house    Suitable for discharge when SNF placement obtained.  Plan for outpatient follow-up 2 weeks from surgery for splint removal, suture removal appropriate, and radiographs.  Likely placement of a cast at that time.  Plan for nonweightbearing 6 to 8 weeks   Jung Ingerson J. [place], PA-C

## 2022-09-13 NOTE — TOC Transition Note (Signed)
Transition of Care [facility]) - CM/SW Discharge Note   Patient Details  Name: Tiffany Brown MRN: [id number] Date of Birth: Nov 03, 1945  Transition of [facility]) CM/SW Contact:  Carley Hammed, LCSW Phone Number: 09/13/2022, 12:21 PM   Clinical Narrative:    Pt to be transported to [facility] via [facility]. Nurse to call report to [phone].   Final next level of care: Skilled Nursing Facility Barriers to Discharge: Barriers Resolved   Patient Goals and CMS Choice CMS Medicare.gov Compare Post Acute Care list provided to:: Patient Choice offered to / list presented to : Patient  Discharge Placement                Patient chooses bed at: [facility] Patient to be transferred to facility by: [facility] Name of family member notified: Patient/ Spouse Patient and family notified of of transfer: 09/13/22  Discharge Plan and Services Additional resources added to the After Visit Summary for   In-house Referral: Clinical Social Work   Post Acute Care Choice: Skilled Nursing Facility                               Social Determinants of Health (SDOH) Interventions SDOH Screenings   Food Insecurity: No Food Insecurity (09/24/2021)  Housing: Low Risk  (09/24/2021)  Transportation Needs: No Transportation Needs (09/24/2021)  Alcohol Screen: Low Risk  (09/24/2021)  Depression (PHQ2-9): High Risk (08/16/2022)  Financial Resource Strain: Low Risk  (09/24/2021)  Physical Activity: Sufficiently Active (09/24/2021)  Social Connections: Moderately Isolated (09/24/2021)  Stress: No Stress Concern Present (09/24/2021)  Tobacco Use: Medium Risk (09/11/2022)     Readmission Risk Interventions     No data to display

## 2022-09-13 NOTE — Discharge Instructions (Signed)

## 2022-09-13 NOTE — Progress Notes (Signed)
Pt left by [facility], called report to Hannah Beat, Inetta Fermo, RN, pt belongings sent with her, called pt husband, Junior, to alert him she has left [place].

## 2022-09-17 DIAGNOSIS — M170 Bilateral primary osteoarthritis of knee: Secondary | ICD-10-CM | POA: Diagnosis not present

## 2022-09-17 DIAGNOSIS — Z9889 Other specified postprocedural states: Secondary | ICD-10-CM | POA: Diagnosis not present

## 2022-09-17 DIAGNOSIS — Z4889 Encounter for other specified surgical aftercare: Secondary | ICD-10-CM | POA: Diagnosis not present

## 2022-09-17 DIAGNOSIS — K219 Gastro-esophageal reflux disease without esophagitis: Secondary | ICD-10-CM | POA: Diagnosis not present

## 2022-09-17 DIAGNOSIS — D649 Anemia, unspecified: Secondary | ICD-10-CM | POA: Diagnosis not present

## 2022-09-17 DIAGNOSIS — E785 Hyperlipidemia, unspecified: Secondary | ICD-10-CM | POA: Diagnosis not present

## 2022-09-17 DIAGNOSIS — I10 Essential (primary) hypertension: Secondary | ICD-10-CM | POA: Diagnosis not present

## 2022-09-17 DIAGNOSIS — M797 Fibromyalgia: Secondary | ICD-10-CM | POA: Diagnosis not present

## 2022-09-23 DIAGNOSIS — M79662 Pain in left lower leg: Secondary | ICD-10-CM | POA: Diagnosis not present

## 2022-09-23 DIAGNOSIS — M797 Fibromyalgia: Secondary | ICD-10-CM | POA: Diagnosis not present

## 2022-09-23 DIAGNOSIS — M79605 Pain in left leg: Secondary | ICD-10-CM | POA: Diagnosis not present

## 2022-09-23 DIAGNOSIS — Z4789 Encounter for other orthopedic aftercare: Secondary | ICD-10-CM | POA: Diagnosis not present

## 2022-09-23 DIAGNOSIS — R109 Unspecified abdominal pain: Secondary | ICD-10-CM | POA: Diagnosis not present

## 2022-09-23 DIAGNOSIS — Z7982 Long term (current) use of aspirin: Secondary | ICD-10-CM | POA: Diagnosis not present

## 2022-09-23 DIAGNOSIS — I10 Essential (primary) hypertension: Secondary | ICD-10-CM | POA: Diagnosis not present

## 2022-09-23 DIAGNOSIS — M7989 Other specified soft tissue disorders: Secondary | ICD-10-CM | POA: Diagnosis not present

## 2022-09-24 DIAGNOSIS — I82492 Acute embolism and thrombosis of other specified deep vein of left lower extremity: Secondary | ICD-10-CM | POA: Diagnosis not present

## 2022-09-24 DIAGNOSIS — M79605 Pain in left leg: Secondary | ICD-10-CM | POA: Diagnosis not present

## 2022-09-25 DIAGNOSIS — M1712 Unilateral primary osteoarthritis, left knee: Secondary | ICD-10-CM | POA: Diagnosis not present

## 2022-09-25 DIAGNOSIS — M19071 Primary osteoarthritis, right ankle and foot: Secondary | ICD-10-CM | POA: Diagnosis not present

## 2022-09-26 ENCOUNTER — Ambulatory Visit (INDEPENDENT_AMBULATORY_CARE_PROVIDER_SITE_OTHER): Payer: Medicare Other

## 2022-09-26 VITALS — Ht 64.0 in | Wt 275.0 lb

## 2022-09-26 DIAGNOSIS — Z0000 Encounter for general adult medical examination without abnormal findings: Secondary | ICD-10-CM | POA: Diagnosis not present

## 2022-09-26 NOTE — Progress Notes (Signed)
Subjective:   Tiffany Brown is a 77 y.o. female who presents for Medicare Annual (Subsequent) preventive examination. I connected with  Ornella C Riebe on 09/26/22 by a audio enabled telemedicine application and verified that I am speaking with the correct person using two identifiers.  Patient Location: Home  Provider Location: Home Office  I discussed the limitations of evaluation and management by telemedicine. The patient expressed understanding and agreed to proceed.  Review of Systems     Cardiac Risk Factors include: advanced age (>64men, >43 women);hypertension;dyslipidemia;diabetes mellitus     Objective:    Today's Vitals   09/26/22 1444  Weight: 275 lb (124.7 kg)  Height: 5\' 4"  (1.626 m)   Body mass index is 47.2 kg/m.     09/26/2022    2:47 PM 09/10/2022    6:41 AM 12/11/2021   11:45 AM 10/24/2021    1:17 PM 09/24/2021    3:47 PM 07/31/2021   12:29 PM 05/01/2021    2:15 PM  Advanced Directives  Does Patient Have a Medical Advance Directive? No No No No No No No  Would patient like information on creating a medical advance directive? No - Patient declined No - Patient declined  No - Patient declined No - Patient declined      Current Medications (verified) Outpatient Encounter Medications as of 09/26/2022  Medication Sig   amLODipine (NORVASC) 5 MG tablet Take 1 tablet (5 mg total) by mouth daily. (Patient taking differently: Take 5 mg by mouth at bedtime.)   aspirin 325 MG tablet Take 1 tablet by mouth for 30 days for blood clot prevention   buPROPion (WELLBUTRIN XL) 150 MG 24 hr tablet TAKE 1 TABLET BY MOUTH EVERY DAY (Patient taking differently: Take 150 mg by mouth at bedtime.)   DULoxetine (CYMBALTA) 60 MG capsule Take 2 capsules (120 mg total) by mouth daily. (Patient taking differently: Take 60 mg by mouth 2 (two) times daily. Morning and lunch)   fluticasone (FLONASE) 50 MCG/ACT nasal spray Place 2 sprays into both nostrils daily as needed for rhinitis.    furosemide (LASIX) 40 MG tablet Take 1 tablet (40 mg total) by mouth 2 (two) times daily as needed.   gabapentin (NEURONTIN) 300 MG capsule Take 1 capsule (300 mg total) by mouth 3 (three) times daily.   HYDROcodone-acetaminophen (NORCO/VICODIN) 5-325 MG tablet Take 1 tablet by mouth every 4-6 hours as needed for pain   ibuprofen (ADVIL) 800 MG tablet Take 1 tablet (800 mg total) by mouth every 6 (six) hours as needed. (Patient taking differently: Take 800 mg by mouth every 6 (six) hours as needed for mild pain or moderate pain.)   losartan (COZAAR) 100 MG tablet Take 1 tablet (100 mg total) by mouth daily.   pravastatin (PRAVACHOL) 80 MG tablet Take 1 tablet (80 mg total) by mouth daily.   Semaglutide, 1 MG/DOSE, 4 MG/3ML SOPN Inject 1 mg as directed once a week.   solifenacin (VESICARE) 10 MG tablet Take 1 tablet (10 mg total) by mouth daily.   No facility-administered encounter medications on file as of 09/26/2022.    Allergies (verified) Patient has no known allergies.   History: Past Medical History:  Diagnosis Date   Abnormal glucose    Arthritis    Depression with anxiety    Essential hypertension, benign    Fibromyalgia    GERD (gastroesophageal reflux disease)    Lymphedema of both lower extremities    and stomach   Melanoma (HCC)  left arm and right arm   Near syncope    Other and unspecified hyperlipidemia    Pre-diabetes    Past Surgical History:  Procedure Laterality Date   ABDOMINAL HYSTERECTOMY     ANKLE ARTHROSCOPY WITH ARTHRODESIS Right 09/10/2022   Procedure: RIGHT EXTENSIVE MIDFOOT CAPSULOTOMY WITH POSTERIOR TIBIAL TENDON LENGTHENING AND ANKLE CAPSULAR RELEASE,  TIBIAL TALOCALCANEAL ARTHRODESIS VERSUS DOUBLE ARTHRODESIS;  Surgeon: Terance Hart, MD;  Location: [facility] OR;  Service: Orthopedics;  Laterality: Right;  LENGTH OF SURGERY: 180 MINUTES   BREAST SURGERY     LEFT   CHOLECYSTECTOMY     ELBOW SURGERY     X's 3 Left   IR ABLATE LIVER CRYOABLATION   02/15/2021   IR RADIOLOGIST EVAL & MGMT  02/08/2021   IR RADIOLOGIST EVAL & MGMT  03/14/2021   MELANOMA EXCISION Left 10/24/2021   Procedure: WIDE LOCAL EXCISION ADVANCEMENT FLAP CLOSURE LEFT UYPPER ARM MELANOMA;  Surgeon: Almond Lint, MD;  Location: [facility] OR;  Service: General;  Laterality: Left;   RIGHT DISTAL RADIUS FRACTURE     TOTAL KNEE ARTHROPLASTY     RIGHT   Family History  Problem Relation Age of Onset   Cancer Mother        lymphoma   Suicidality Father    Lung cancer Sister    Heart disease Maternal Grandmother        no details   Breast cancer Neg Hx    Social History   Socioeconomic History   Marital status: Married    Spouse name: jr   Number of children: 3   Years of education: 12   Highest education level: 12th grade  Occupational History   Occupation: Homemaker  Tobacco Use   Smoking status: Former    Packs/day: 0.80    Years: 25.00    Additional pack years: 0.00    Total pack years: 20.00    Types: Cigarettes    Quit date: 05/06/1994    Years since quitting: 28.4    Passive exposure: Past   Smokeless tobacco: Never  Vaping Use   Vaping Use: Never used  Substance and Sexual Activity   Alcohol use: Never   Drug use: Never   Sexual activity: Yes    Birth control/protection: Post-menopausal    Comment: Married since 1964  Other Topics Concern   Not on file  Social History Narrative   Has 3 children. Lives with husband. One daughter lives close by   Social Determinants of Health   Financial Resource Strain: Low Risk  (09/26/2022)   Overall Financial Resource Strain (CARDIA)    Difficulty of Paying Living Expenses: Not hard at all  Food Insecurity: No Food Insecurity (09/26/2022)   Hunger Vital Sign    Worried About Running Out of Food in the Last Year: Never true    Ran Out of Food in the Last Year: Never true  Transportation Needs: No Transportation Needs (09/26/2022)   PRAPARE - Administrator, Civil Service (Medical): No    Lack of  Transportation (Non-Medical): No  Physical Activity: Insufficiently Active (09/26/2022)   Exercise Vital Sign    Days of Exercise per Week: 3 days    Minutes of Exercise per Session: 30 min  Stress: No Stress Concern Present (09/26/2022)   [employer] of Occupational Health - Occupational Stress Questionnaire    Feeling of Stress : Not at all  Social Connections: Moderately Isolated (09/26/2022)   Social Connection and Isolation Panel [NHANES]  Frequency of Communication with Friends and Family: More than three times a week    Frequency of Social Gatherings with Friends and Family: More than three times a week    Attends Religious Services: Never    Database administrator or Organizations: No    Attends Engineer, structural: Never    Marital Status: Married    Tobacco Counseling Counseling given: Not Answered   Clinical Intake:  Pre-visit preparation completed: Yes  Pain : No/denies pain     Nutritional Risks: None Diabetes: No  How often do you need to have someone help you when you read instructions, pamphlets, or other written materials from your doctor or pharmacy?: 1 - Never  Diabetic?yes  Nutrition Risk Assessment:  Has the patient had any N/V/D within the last 2 months?  No  Does the patient have any non-healing wounds?  No  Has the patient had any unintentional weight loss or weight gain?  No   Diabetes:  Is the patient diabetic?  Yes  If diabetic, was a CBG obtained today?  No  Did the patient bring in their glucometer from home?  No  How often do you monitor your CBG's? Never .   Financial Strains and Diabetes Management:  Are you having any financial strains with the device, your supplies or your medication? No .  Does the patient want to be seen by Chronic Care Management for management of their diabetes?  No  Would the patient like to be referred to a Nutritionist or for Diabetic Management?  No   Diabetic Exams:  Diabetic Eye Exam:  Overdue for diabetic eye exam. Pt has been advised about the importance in completing this exam. Patient advised to call and schedule an eye exam. Diabetic Foot Exam: Overdue, Pt has been advised about the importance in completing this exam. Pt is scheduled for diabetic foot exam on next office visit .   Interpreter Needed?: No  Information entered by :: Renie Ora, LPN   Activities of Daily Living    09/26/2022    2:47 PM 09/10/2022    6:48 AM  In your present state of health, do you have any difficulty performing the following activities:  Hearing? 0   Vision? 0   Difficulty concentrating or making decisions? 0   Walking or climbing stairs? 0   Dressing or bathing? 0   Doing errands, shopping? 0 1  Preparing Food and eating ? N   Using the Toilet? N   In the past six months, have you accidently leaked urine? N   Do you have problems with loss of bowel control? N   Managing your Medications? N   Managing your Finances? N   Housekeeping or managing your Housekeeping? N     Patient Care Team: Dettinger, Elige Radon, MD as PCP - General (Family Medicine) Regal, Kirstie Peri, DPM as Consulting Physician (Podiatry) Allena Katz Gillie Manners, DPM as Consulting Physician (Podiatry) Danella Maiers, [facility] as Pharmacist (Family Medicine)  Indicate any recent Medical Services you may have received from other than [facility] providers in the past year (date may be approximate).     Assessment:   This is a routine wellness examination for Tiffany Brown.  Hearing/Vision screen Vision Screening - Comments:: Patient will schedule with [facility]  Dietary issues and exercise activities discussed: Current Exercise Habits: Home exercise routine, Type of exercise: strength training/weights, Time (Minutes): 30, Frequency (Times/Week): 3, Weekly Exercise (Minutes/Week): 90, Intensity: Mild, Exercise limited by: orthopedic condition(s)  Goals Addressed             This Visit's Progress    DIET - INCREASE WATER  INTAKE         Depression Screen    09/26/2022    2:46 PM 08/16/2022   10:33 AM 05/17/2022    1:38 PM 02/14/2022    1:16 PM 11/14/2021    1:00 PM 09/24/2021    3:40 PM 07/25/2021    1:32 PM  PHQ 2/9 Scores  PHQ - 2 Score 0 4 2 4 2 2 6   PHQ- 9 Score 0 14 11 16 9 7 26     Fall Risk    09/26/2022    2:45 PM 08/16/2022   10:33 AM 05/17/2022    1:38 PM 02/14/2022    1:16 PM 11/14/2021    1:00 PM  Fall Risk   Falls in the past year? 1 0 0 1 1  Number falls in past yr: 1   1 1   Injury with Fall? 1   0 0  Risk for fall due to : History of fall(s);Impaired balance/gait;Orthopedic patient   Impaired mobility;Impaired balance/gait;Orthopedic patient History of fall(s);Impaired balance/gait  Follow up Education provided;Falls prevention discussed   Falls evaluation completed Education provided;Falls evaluation completed    FALL RISK PREVENTION PERTAINING TO THE HOME:  Any stairs in or around the home? Yes  If so, are there any without handrails? No  Home free of loose throw rugs in walkways, pet beds, electrical cords, etc? Yes  Adequate lighting in your home to reduce risk of falls? Yes   ASSISTIVE DEVICES UTILIZED TO PREVENT FALLS:  Life alert? No  Use of a cane, walker or w/c? Yes  Grab bars in the bathroom? No  Shower chair or bench in shower? No  Elevated toilet seat or a handicapped toilet? No          09/26/2022    2:48 PM 09/24/2021    3:42 PM 07/22/2019    9:55 AM 07/22/2019    9:42 AM  6CIT Screen  What Year? 0 points 0 points 0 points 0 points  What month? 0 points 0 points 0 points 0 points  What time? 0 points 0 points 0 points   Count back from 20 0 points 0 points 0 points   Months in reverse 0 points 0 points 2 points   Repeat phrase 0 points 2 points 0 points   Total Score 0 points 2 points 2 points     Immunizations Immunization History  Administered Date(s) Administered   Fluad Quad(high Dose 65+) 01/24/2020, 01/10/2022   Influenza, High Dose Seasonal PF  03/06/2016, 01/28/2017, 02/12/2018, 02/25/2019   Influenza,inj,Quad PF,6+ Mos 01/25/2021   Influenza,trivalent, recombinat, inj, PF 02/10/2014, 02/07/2015   Influenza-Unspecified 02/10/2014, 02/07/2015   Moderna Sars-Covid-2 Vaccination 06/30/2019, 07/28/2019, 03/07/2020   Pneumococcal Conjugate-13 07/03/2015   Pneumococcal Polysaccharide-23 08/05/2016   Td,absorbed, Preservative Free, Adult Use, Lf Unspecified 11/16/2004   Tdap 02/10/2014, 05/03/2019   Zoster Recombinat (Shingrix) 10/25/2020, 07/25/2021    TDAP status: Up to date  Flu Vaccine status: Up to date  Pneumococcal vaccine status: Up to date  Covid-19 vaccine status: Completed vaccines  Qualifies for Shingles Vaccine? Yes   Zostavax completed Yes   Shingrix Completed?: Yes  Screening Tests Health Maintenance  Topic Date Due   Diabetic kidney evaluation - Urine ACR  Never done   Hepatitis C Screening  Never done   COVID-19 Vaccine (4 - 2023-24 season) 01/04/2022  MAMMOGRAM  08/14/2022   DEXA SCAN  05/18/2023 (Originally 05/02/2022)   INFLUENZA VACCINE  12/05/2022   HEMOGLOBIN A1C  02/15/2023   Diabetic kidney evaluation - eGFR measurement  09/10/2023   Medicare Annual Wellness (AWV)  09/26/2023   DTaP/Tdap/Td (3 - Td or Tdap) 05/02/2029   Pneumonia Vaccine 28+ Years old  Completed   Zoster Vaccines- Shingrix  Completed   HPV VACCINES  Aged Out   FOOT EXAM  Discontinued   OPHTHALMOLOGY EXAM  Discontinued   Colonoscopy  Discontinued    Health Maintenance  Health Maintenance Due  Topic Date Due   Diabetic kidney evaluation - Urine ACR  Never done   Hepatitis C Screening  Never done   COVID-19 Vaccine (4 - 2023-24 season) 01/04/2022   MAMMOGRAM  08/14/2022    Colorectal cancer screening: No longer required.   Mammogram status: No longer required due to age.  Bone Density status: Ordered declined . Pt provided with contact info and advised to call to schedule appt.  Lung Cancer Screening: (Low Dose  CT Chest recommended if Age 12-80 years, 30 pack-year currently smoking OR have quit w/in 15years.) does not qualify.   Lung Cancer Screening Referral: n/a  Additional Screening:  Hepatitis C Screening: does not qualify;   Vision Screening: Recommended annual ophthalmology exams for early detection of glaucoma and other disorders of the eye. Is the patient up to date with their annual eye exam?  No  Who is the provider or what is the name of the office in which the patient attends annual eye exams? Dr[facility]  If pt is not established with a provider, would they like to be referred to a provider to establish care? No .   Dental Screening: Recommended annual dental exams for proper oral hygiene  Community Resource Referral / Chronic Care Management: CRR required this visit?  No   CCM required this visit?  No      Plan:     I have personally reviewed and noted the following in the patient's chart:   Medical and social history Use of alcohol, tobacco or illicit drugs  Current medications and supplements including opioid prescriptions. Patient is not currently taking opioid prescriptions. Functional ability and status Nutritional status Physical activity Advanced directives List of other physicians Hospitalizations, surgeries, and ER visits in previous 12 months Vitals Screenings to include cognitive, depression, and falls Referrals and appointments  In addition, I have reviewed and discussed with patient certain preventive protocols, quality metrics, and best practice recommendations. A written personalized care plan for preventive services as well as general preventive health recommendations were provided to patient.     Lorrene Reid, LPN   [id number]/61/0960   Nurse Notes: none

## 2022-09-26 NOTE — Patient Instructions (Signed)
Tiffany Brown , Thank you for taking time to come for your Medicare Wellness Visit. I appreciate your ongoing commitment to your health goals. Please review the following plan we discussed and let me know if I can assist you in the future.   These are the goals we discussed:  Goals       DIET - INCREASE WATER INTAKE      Increase physical activity (pt-stated)      Gradually as Lymphedema gets better.      Prevent falls (pt-stated)      Now walking with walker, be aware of surroundings - Increase exercise as tolerated, lose weight , be able to get around easier without walker      T2DM, HLD, HTN PHARMD (pt-stated)      Current Barriers:  Unable to independently afford treatment regimen Unable to maintain control of T2DM, HLD, HTN  Pharmacist Clinical Goal(s):  patient will verbalize ability to afford treatment regimen maintain control of T2DM, HLD, HTN as evidenced by A1C<7%, LDL<70, BP<130/80  through collaboration with PharmD and provider.    Interventions: 1:1 collaboration with Dettinger, Elige Radon, MD regarding development and update of comprehensive plan of care as evidenced by provider attestation and co-signature Inter-disciplinary care team collaboration (see longitudinal plan of care) Comprehensive medication review performed; medication list updated in electronic medical record  Diabetes: Goal on Track (progressing): YES. Controlled; current treatment:OZEMPIC INCREASE TO 1MG  WEEKLY APPLICATION SUBMITTED FOR [facility] PATIENT ASSISTANCE (approved-patient received #3boxes this month) TOLERATING WELL/DENIES SIDE EFFECTS Denies personal and family history of Medullary thyroid cancer (MTC) Current glucose readings: fasting glucose: <135, post prandial glucose: N/A Denies hypoglycemic/hyperglycemic symptoms Discussed meal planning options and Plate method for healthy eating Avoid sugary drinks and desserts Incorporate balanced protein, non starchy veggies, 1 serving of  carbohydrate with each meal Increase water intake Increase physical activity as able Current exercise: KNEE PROBLEMS/NEEDS REPLACEMENT; ALSO MENTIONS FOOT PROBLEMS  Hyperlipidemia LDL 68, currently controlled Continue current statin and dietary heart healthy recommendations Lipid Panel     Component Value Date/Time   CHOL 152 11/14/2021 1310   TRIG 203 (H) 11/14/2021 1310   HDL 51 11/14/2021 1310   CHOLHDL 3.0 11/14/2021 1310   LDLCALC 68 11/14/2021 1310   LABVLDL 33 11/14/2021 1310  Hypertension: BP<130s/80s per patient home report Compliant with medication Instructed patient to keep a check on this and report back if BP increases again Will continue to follow  Patient Goals/Self-Care Activities patient will:  - take medications as prescribed as evidenced by patient report and record review check glucose DAILY/FASTING OR SYMPTOMATIC, document, and provide at future appointments collaborate with provider on medication access solutions engage in dietary modifications by .FOLLOWING A HEART HEALTHY DIET/HEALTHY PLATE METHOD          This is a list of the screening recommended for you and due dates:  Health Maintenance  Topic Date Due   Yearly kidney health urinalysis for diabetes  Never done   Hepatitis C Screening  Never done   COVID-19 Vaccine (4 - 2023-24 season) 01/04/2022   Mammogram  08/14/2022   DEXA scan (bone density measurement)  05/18/2023*   Flu Shot  12/05/2022   Hemoglobin A1C  02/15/2023   Yearly kidney function blood test for diabetes  09/10/2023   Medicare Annual Wellness Visit  09/26/2023   DTaP/Tdap/Td vaccine (3 - Td or Tdap) 05/02/2029   Pneumonia Vaccine  Completed   Zoster (Shingles) Vaccine  Completed   HPV Vaccine  Aged  Out   Complete foot exam   Discontinued   Eye exam for diabetics  Discontinued   Colon Cancer Screening  Discontinued  *Topic was postponed. The date shown is not the original due date.    Advanced directives: Advance  directive discussed with you today. I have provided a copy for you to complete at home and have notarized. Once this is complete please bring a copy in to our office so we can scan it into your chart.   Conditions/risks identified: Aim for 30 minutes of exercise or brisk walking, 6-8 glasses of water, and 5 servings of fruits and vegetables each day.   Next appointment: Follow up in one year for your annual wellness visit    Preventive Care 65 Years and Older, Female Preventive care refers to lifestyle choices and visits with your health care provider that can promote health and wellness. What does preventive care include? A yearly physical exam. This is also called an annual well check. Dental exams once or twice a year. Routine eye exams. Ask your health care provider how often you should have your eyes checked. Personal lifestyle choices, including: Daily care of your teeth and gums. Regular physical activity. Eating a healthy diet. Avoiding tobacco and drug use. Limiting alcohol use. Practicing safe sex. Taking low-dose aspirin every day. Taking vitamin and mineral supplements as recommended by your health care provider. What happens during an annual well check? The services and screenings done by your health care provider during your annual well check will depend on your age, overall health, lifestyle risk factors, and family history of disease. Counseling  Your health care provider may ask you questions about your: Alcohol use. Tobacco use. Drug use. Emotional well-being. Home and relationship well-being. Sexual activity. Eating habits. History of falls. Memory and ability to understand (cognition). Work and work Astronomer. Reproductive health. Screening  You may have the following tests or measurements: Height, weight, and BMI. Blood pressure. Lipid and cholesterol levels. These may be checked every 5 years, or more frequently if you are over 76 years old. Skin  check. Lung cancer screening. You may have this screening every year starting at age 64 if you have a 30-pack-year history of smoking and currently smoke or have quit within the past 15 years. Fecal occult blood test (FOBT) of the stool. You may have this test every year starting at age 66. Flexible sigmoidoscopy or colonoscopy. You may have a sigmoidoscopy every 5 years or a colonoscopy every 10 years starting at age 48. Hepatitis C blood test. Hepatitis B blood test. Sexually transmitted disease (STD) testing. Diabetes screening. This is done by checking your blood sugar (glucose) after you have not eaten for a while (fasting). You may have this done every 1-3 years. Bone density scan. This is done to screen for osteoporosis. You may have this done starting at age 44. Mammogram. This may be done every 1-2 years. Talk to your health care provider about how often you should have regular mammograms. Talk with your health care provider about your test results, treatment options, and if necessary, the need for more tests. Vaccines  Your health care provider may recommend certain vaccines, such as: Influenza vaccine. This is recommended every year. Tetanus, diphtheria, and acellular pertussis (Tdap, Td) vaccine. You may need a Td booster every 10 years. Zoster vaccine. You may need this after age 87. Pneumococcal 13-valent conjugate (PCV13) vaccine. One dose is recommended after age 11. Pneumococcal polysaccharide (PPSV23) vaccine. One dose is recommended after  age 72. Talk to your health care provider about which screenings and vaccines you need and how often you need them. This information is not intended to replace advice given to you by your health care provider. Make sure you discuss any questions you have with your health care provider. Document Released: 05/19/2015 Document Revised: 01/10/2016 Document Reviewed: 02/21/2015 Elsevier Interactive Patient Education  2017 [employer].  Fall  Prevention in the Home Falls can cause injuries. They can happen to people of all ages. There are many things you can do to make your home safe and to help prevent falls. What can I do on the outside of my home? Regularly fix the edges of walkways and driveways and fix any cracks. Remove anything that might make you trip as you walk through a door, such as a raised step or threshold. Trim any bushes or trees on the path to your home. Use bright outdoor lighting. Clear any walking paths of anything that might make someone trip, such as rocks or tools. Regularly check to see if handrails are loose or broken. Make sure that both sides of any steps have handrails. Any raised decks and porches should have guardrails on the edges. Have any leaves, snow, or ice cleared regularly. Use sand or salt on walking paths during winter. Clean up any spills in your garage right away. This includes oil or grease spills. What can I do in the bathroom? Use night lights. Install grab bars by the toilet and in the tub and shower. Do not use towel bars as grab bars. Use non-skid mats or decals in the tub or shower. If you need to sit down in the shower, use a plastic, non-slip stool. Keep the floor dry. Clean up any water that spills on the floor as soon as it happens. Remove soap buildup in the tub or shower regularly. Attach bath mats securely with double-sided non-slip rug tape. Do not have throw rugs and other things on the floor that can make you trip. What can I do in the bedroom? Use night lights. Make sure that you have a light by your bed that is easy to reach. Do not use any sheets or blankets that are too big for your bed. They should not hang down onto the floor. Have a firm chair that has side arms. You can use this for support while you get dressed. Do not have throw rugs and other things on the floor that can make you trip. What can I do in the kitchen? Clean up any spills right away. Avoid  walking on wet floors. Keep items that you use a lot in easy-to-reach places. If you need to reach something above you, use a strong step stool that has a grab bar. Keep electrical cords out of the way. Do not use floor polish or wax that makes floors slippery. If you must use wax, use non-skid floor wax. Do not have throw rugs and other things on the floor that can make you trip. What can I do with my stairs? Do not leave any items on the stairs. Make sure that there are handrails on both sides of the stairs and use them. Fix handrails that are broken or loose. Make sure that handrails are as long as the stairways. Check any carpeting to make sure that it is firmly attached to the stairs. Fix any carpet that is loose or worn. Avoid having throw rugs at the top or bottom of the stairs. If you do  have throw rugs, attach them to the floor with carpet tape. Make sure that you have a light switch at the top of the stairs and the bottom of the stairs. If you do not have them, ask someone to add them for you. What else can I do to help prevent falls? Wear shoes that: Do not have high heels. Have rubber bottoms. Are comfortable and fit you well. Are closed at the toe. Do not wear sandals. If you use a stepladder: Make sure that it is fully opened. Do not climb a closed stepladder. Make sure that both sides of the stepladder are locked into place. Ask someone to hold it for you, if possible. Clearly mark and make sure that you can see: Any grab bars or handrails. First and last steps. Where the edge of each step is. Use tools that help you move around (mobility aids) if they are needed. These include: Canes. Walkers. Scooters. Crutches. Turn on the lights when you go into a dark area. Replace any light bulbs as soon as they burn out. Set up your furniture so you have a clear path. Avoid moving your furniture around. If any of your floors are uneven, fix them. If there are any pets around  you, be aware of where they are. Review your medicines with your doctor. Some medicines can make you feel dizzy. This can increase your chance of falling. Ask your doctor what other things that you can do to help prevent falls. This information is not intended to replace advice given to you by your health care provider. Make sure you discuss any questions you have with your health care provider. Document Released: 02/16/2009 Document Revised: 09/28/2015 Document Reviewed: 05/27/2014 Elsevier Interactive Patient Education  2017 [employer].

## 2022-10-01 DIAGNOSIS — R338 Other retention of urine: Secondary | ICD-10-CM | POA: Diagnosis not present

## 2022-10-02 DIAGNOSIS — I10 Essential (primary) hypertension: Secondary | ICD-10-CM | POA: Diagnosis not present

## 2022-10-02 DIAGNOSIS — N390 Urinary tract infection, site not specified: Secondary | ICD-10-CM | POA: Diagnosis not present

## 2022-10-02 DIAGNOSIS — G4700 Insomnia, unspecified: Secondary | ICD-10-CM | POA: Diagnosis not present

## 2022-10-02 DIAGNOSIS — B964 Proteus (mirabilis) (morganii) as the cause of diseases classified elsewhere: Secondary | ICD-10-CM | POA: Diagnosis not present

## 2022-10-02 DIAGNOSIS — Z9889 Other specified postprocedural states: Secondary | ICD-10-CM | POA: Diagnosis not present

## 2022-10-04 DIAGNOSIS — M19071 Primary osteoarthritis, right ankle and foot: Secondary | ICD-10-CM | POA: Diagnosis not present

## 2022-10-05 DIAGNOSIS — N390 Urinary tract infection, site not specified: Secondary | ICD-10-CM | POA: Diagnosis not present

## 2022-10-05 DIAGNOSIS — R339 Retention of urine, unspecified: Secondary | ICD-10-CM | POA: Diagnosis not present

## 2022-10-14 DIAGNOSIS — M797 Fibromyalgia: Secondary | ICD-10-CM | POA: Diagnosis not present

## 2022-10-14 DIAGNOSIS — D649 Anemia, unspecified: Secondary | ICD-10-CM | POA: Diagnosis not present

## 2022-10-14 DIAGNOSIS — E785 Hyperlipidemia, unspecified: Secondary | ICD-10-CM | POA: Diagnosis not present

## 2022-10-14 DIAGNOSIS — K219 Gastro-esophageal reflux disease without esophagitis: Secondary | ICD-10-CM | POA: Diagnosis not present

## 2022-10-14 DIAGNOSIS — I10 Essential (primary) hypertension: Secondary | ICD-10-CM | POA: Diagnosis not present

## 2022-10-14 DIAGNOSIS — Z9889 Other specified postprocedural states: Secondary | ICD-10-CM | POA: Diagnosis not present

## 2022-10-15 DIAGNOSIS — D649 Anemia, unspecified: Secondary | ICD-10-CM | POA: Diagnosis not present

## 2022-10-15 DIAGNOSIS — N390 Urinary tract infection, site not specified: Secondary | ICD-10-CM | POA: Diagnosis not present

## 2022-10-15 DIAGNOSIS — I10 Essential (primary) hypertension: Secondary | ICD-10-CM | POA: Diagnosis not present

## 2022-10-17 DIAGNOSIS — M6281 Muscle weakness (generalized): Secondary | ICD-10-CM | POA: Diagnosis not present

## 2022-10-17 DIAGNOSIS — M24574 Contracture, right foot: Secondary | ICD-10-CM | POA: Diagnosis not present

## 2022-10-17 DIAGNOSIS — M79671 Pain in right foot: Secondary | ICD-10-CM | POA: Diagnosis not present

## 2022-10-18 DIAGNOSIS — M79671 Pain in right foot: Secondary | ICD-10-CM | POA: Diagnosis not present

## 2022-10-19 DIAGNOSIS — N390 Urinary tract infection, site not specified: Secondary | ICD-10-CM | POA: Diagnosis not present

## 2022-10-19 DIAGNOSIS — R52 Pain, unspecified: Secondary | ICD-10-CM | POA: Diagnosis not present

## 2022-10-19 DIAGNOSIS — R339 Retention of urine, unspecified: Secondary | ICD-10-CM | POA: Diagnosis not present

## 2022-10-21 DIAGNOSIS — Z9889 Other specified postprocedural states: Secondary | ICD-10-CM | POA: Diagnosis not present

## 2022-10-21 DIAGNOSIS — N390 Urinary tract infection, site not specified: Secondary | ICD-10-CM | POA: Diagnosis not present

## 2022-10-21 DIAGNOSIS — B964 Proteus (mirabilis) (morganii) as the cause of diseases classified elsewhere: Secondary | ICD-10-CM | POA: Diagnosis not present

## 2022-10-21 DIAGNOSIS — Z960 Presence of urogenital implants: Secondary | ICD-10-CM | POA: Diagnosis not present

## 2022-10-21 DIAGNOSIS — R339 Retention of urine, unspecified: Secondary | ICD-10-CM | POA: Diagnosis not present

## 2022-10-23 ENCOUNTER — Telehealth: Payer: Self-pay | Admitting: Family Medicine

## 2022-10-23 DIAGNOSIS — Z0279 Encounter for issue of other medical certificate: Secondary | ICD-10-CM

## 2022-10-23 NOTE — Telephone Encounter (Signed)
Junior Miner dropped off handicap forms to be completed and signed.  Form Fee Paid? (Y/N)    YES        If YES, then form will be placed in the RX/HH Nurse Coordinators box for completion.

## 2022-10-28 DIAGNOSIS — R338 Other retention of urine: Secondary | ICD-10-CM | POA: Diagnosis not present

## 2022-10-28 DIAGNOSIS — K5900 Constipation, unspecified: Secondary | ICD-10-CM | POA: Diagnosis not present

## 2022-10-28 NOTE — Telephone Encounter (Signed)
Aware handicap form ready to be picked up.

## 2022-11-13 DIAGNOSIS — M24574 Contracture, right foot: Secondary | ICD-10-CM | POA: Diagnosis not present

## 2022-11-13 DIAGNOSIS — M6281 Muscle weakness (generalized): Secondary | ICD-10-CM | POA: Diagnosis not present

## 2022-11-13 DIAGNOSIS — M79671 Pain in right foot: Secondary | ICD-10-CM | POA: Diagnosis not present

## 2022-11-13 DIAGNOSIS — Z96661 Presence of right artificial ankle joint: Secondary | ICD-10-CM | POA: Diagnosis not present

## 2022-11-14 DIAGNOSIS — M24574 Contracture, right foot: Secondary | ICD-10-CM | POA: Diagnosis not present

## 2022-11-14 DIAGNOSIS — M6281 Muscle weakness (generalized): Secondary | ICD-10-CM | POA: Diagnosis not present

## 2022-11-14 DIAGNOSIS — Z96661 Presence of right artificial ankle joint: Secondary | ICD-10-CM | POA: Diagnosis not present

## 2022-11-15 ENCOUNTER — Ambulatory Visit: Payer: Medicare Other | Admitting: Family Medicine

## 2022-11-15 DIAGNOSIS — M24574 Contracture, right foot: Secondary | ICD-10-CM | POA: Diagnosis not present

## 2022-11-15 DIAGNOSIS — M6281 Muscle weakness (generalized): Secondary | ICD-10-CM | POA: Diagnosis not present

## 2022-11-15 DIAGNOSIS — Z96661 Presence of right artificial ankle joint: Secondary | ICD-10-CM | POA: Diagnosis not present

## 2022-11-18 DIAGNOSIS — M6281 Muscle weakness (generalized): Secondary | ICD-10-CM | POA: Diagnosis not present

## 2022-11-18 DIAGNOSIS — Z96661 Presence of right artificial ankle joint: Secondary | ICD-10-CM | POA: Diagnosis not present

## 2022-11-18 DIAGNOSIS — M24574 Contracture, right foot: Secondary | ICD-10-CM | POA: Diagnosis not present

## 2022-11-19 DIAGNOSIS — Z96661 Presence of right artificial ankle joint: Secondary | ICD-10-CM | POA: Diagnosis not present

## 2022-11-19 DIAGNOSIS — M24574 Contracture, right foot: Secondary | ICD-10-CM | POA: Diagnosis not present

## 2022-11-19 DIAGNOSIS — M6281 Muscle weakness (generalized): Secondary | ICD-10-CM | POA: Diagnosis not present

## 2022-11-20 DIAGNOSIS — Z96661 Presence of right artificial ankle joint: Secondary | ICD-10-CM | POA: Diagnosis not present

## 2022-11-20 DIAGNOSIS — M6281 Muscle weakness (generalized): Secondary | ICD-10-CM | POA: Diagnosis not present

## 2022-11-20 DIAGNOSIS — M24574 Contracture, right foot: Secondary | ICD-10-CM | POA: Diagnosis not present

## 2022-11-21 DIAGNOSIS — Z96661 Presence of right artificial ankle joint: Secondary | ICD-10-CM | POA: Diagnosis not present

## 2022-11-21 DIAGNOSIS — M24574 Contracture, right foot: Secondary | ICD-10-CM | POA: Diagnosis not present

## 2022-11-21 DIAGNOSIS — M6281 Muscle weakness (generalized): Secondary | ICD-10-CM | POA: Diagnosis not present

## 2022-11-22 DIAGNOSIS — Z96661 Presence of right artificial ankle joint: Secondary | ICD-10-CM | POA: Diagnosis not present

## 2022-11-22 DIAGNOSIS — M24574 Contracture, right foot: Secondary | ICD-10-CM | POA: Diagnosis not present

## 2022-11-22 DIAGNOSIS — M6281 Muscle weakness (generalized): Secondary | ICD-10-CM | POA: Diagnosis not present

## 2022-11-27 DIAGNOSIS — M79671 Pain in right foot: Secondary | ICD-10-CM | POA: Diagnosis not present

## 2022-11-27 DIAGNOSIS — M19071 Primary osteoarthritis, right ankle and foot: Secondary | ICD-10-CM | POA: Diagnosis not present

## 2022-11-28 DIAGNOSIS — Z981 Arthrodesis status: Secondary | ICD-10-CM | POA: Diagnosis not present

## 2022-11-28 DIAGNOSIS — Z4789 Encounter for other orthopedic aftercare: Secondary | ICD-10-CM | POA: Diagnosis not present

## 2022-11-28 DIAGNOSIS — Z7985 Long-term (current) use of injectable non-insulin antidiabetic drugs: Secondary | ICD-10-CM | POA: Diagnosis not present

## 2022-11-28 DIAGNOSIS — Z556 Problems related to health literacy: Secondary | ICD-10-CM | POA: Diagnosis not present

## 2022-11-28 DIAGNOSIS — Z604 Social exclusion and rejection: Secondary | ICD-10-CM | POA: Diagnosis not present

## 2022-11-28 DIAGNOSIS — I10 Essential (primary) hypertension: Secondary | ICD-10-CM | POA: Diagnosis not present

## 2022-11-28 DIAGNOSIS — M797 Fibromyalgia: Secondary | ICD-10-CM | POA: Diagnosis not present

## 2022-11-28 DIAGNOSIS — Z993 Dependence on wheelchair: Secondary | ICD-10-CM | POA: Diagnosis not present

## 2022-11-28 DIAGNOSIS — Z96651 Presence of right artificial knee joint: Secondary | ICD-10-CM | POA: Diagnosis not present

## 2022-11-28 DIAGNOSIS — M19071 Primary osteoarthritis, right ankle and foot: Secondary | ICD-10-CM | POA: Diagnosis not present

## 2022-12-02 ENCOUNTER — Inpatient Hospital Stay: Payer: Medicare Other | Admitting: Family Medicine

## 2022-12-03 ENCOUNTER — Other Ambulatory Visit: Payer: Self-pay

## 2022-12-03 ENCOUNTER — Emergency Department (HOSPITAL_COMMUNITY): Payer: Medicare Other

## 2022-12-03 ENCOUNTER — Encounter (HOSPITAL_COMMUNITY): Payer: Self-pay

## 2022-12-03 ENCOUNTER — Inpatient Hospital Stay (HOSPITAL_COMMUNITY)
Admission: EM | Admit: 2022-12-03 | Discharge: 2022-12-10 | DRG: 392 | Disposition: A | Discharge status: Skilled Nursing Facility | Payer: Medicare Other | Attending: Internal Medicine | Admitting: Internal Medicine

## 2022-12-03 DIAGNOSIS — N3281 Overactive bladder: Secondary | ICD-10-CM | POA: Diagnosis not present

## 2022-12-03 DIAGNOSIS — E1169 Type 2 diabetes mellitus with other specified complication: Secondary | ICD-10-CM | POA: Diagnosis not present

## 2022-12-03 DIAGNOSIS — K219 Gastro-esophageal reflux disease without esophagitis: Secondary | ICD-10-CM | POA: Diagnosis present

## 2022-12-03 DIAGNOSIS — I10 Essential (primary) hypertension: Secondary | ICD-10-CM | POA: Diagnosis present

## 2022-12-03 DIAGNOSIS — Z743 Need for continuous supervision: Secondary | ICD-10-CM | POA: Diagnosis not present

## 2022-12-03 DIAGNOSIS — Z96651 Presence of right artificial knee joint: Secondary | ICD-10-CM | POA: Diagnosis not present

## 2022-12-03 DIAGNOSIS — Z85820 Personal history of malignant melanoma of skin: Secondary | ICD-10-CM | POA: Diagnosis not present

## 2022-12-03 DIAGNOSIS — Z801 Family history of malignant neoplasm of trachea, bronchus and lung: Secondary | ICD-10-CM | POA: Diagnosis not present

## 2022-12-03 DIAGNOSIS — B9620 Unspecified Escherichia coli [E. coli] as the cause of diseases classified elsewhere: Secondary | ICD-10-CM | POA: Diagnosis present

## 2022-12-03 DIAGNOSIS — R110 Nausea: Secondary | ICD-10-CM | POA: Diagnosis not present

## 2022-12-03 DIAGNOSIS — R262 Difficulty in walking, not elsewhere classified: Secondary | ICD-10-CM | POA: Diagnosis not present

## 2022-12-03 DIAGNOSIS — F3342 Major depressive disorder, recurrent, in full remission: Secondary | ICD-10-CM | POA: Diagnosis present

## 2022-12-03 DIAGNOSIS — Z7401 Bed confinement status: Secondary | ICD-10-CM

## 2022-12-03 DIAGNOSIS — Z7985 Long-term (current) use of injectable non-insulin antidiabetic drugs: Secondary | ICD-10-CM

## 2022-12-03 DIAGNOSIS — R54 Age-related physical debility: Secondary | ICD-10-CM | POA: Diagnosis not present

## 2022-12-03 DIAGNOSIS — R1030 Lower abdominal pain, unspecified: Secondary | ICD-10-CM | POA: Diagnosis not present

## 2022-12-03 DIAGNOSIS — Z87891 Personal history of nicotine dependence: Secondary | ICD-10-CM

## 2022-12-03 DIAGNOSIS — N3090 Cystitis, unspecified without hematuria: Secondary | ICD-10-CM

## 2022-12-03 DIAGNOSIS — Z79899 Other long term (current) drug therapy: Secondary | ICD-10-CM

## 2022-12-03 DIAGNOSIS — K529 Noninfective gastroenteritis and colitis, unspecified: Principal | ICD-10-CM | POA: Diagnosis present

## 2022-12-03 DIAGNOSIS — F32A Depression, unspecified: Secondary | ICD-10-CM | POA: Diagnosis not present

## 2022-12-03 DIAGNOSIS — E785 Hyperlipidemia, unspecified: Secondary | ICD-10-CM | POA: Diagnosis present

## 2022-12-03 DIAGNOSIS — Z8249 Family history of ischemic heart disease and other diseases of the circulatory system: Secondary | ICD-10-CM | POA: Diagnosis not present

## 2022-12-03 DIAGNOSIS — E7800 Pure hypercholesterolemia, unspecified: Secondary | ICD-10-CM | POA: Diagnosis present

## 2022-12-03 DIAGNOSIS — E876 Hypokalemia: Secondary | ICD-10-CM

## 2022-12-03 DIAGNOSIS — Z7982 Long term (current) use of aspirin: Secondary | ICD-10-CM

## 2022-12-03 DIAGNOSIS — Z807 Family history of other malignant neoplasms of lymphoid, hematopoietic and related tissues: Secondary | ICD-10-CM

## 2022-12-03 DIAGNOSIS — M797 Fibromyalgia: Secondary | ICD-10-CM | POA: Diagnosis not present

## 2022-12-03 DIAGNOSIS — N200 Calculus of kidney: Secondary | ICD-10-CM | POA: Diagnosis not present

## 2022-12-03 DIAGNOSIS — D649 Anemia, unspecified: Secondary | ICD-10-CM | POA: Diagnosis not present

## 2022-12-03 DIAGNOSIS — D509 Iron deficiency anemia, unspecified: Secondary | ICD-10-CM | POA: Diagnosis not present

## 2022-12-03 DIAGNOSIS — R1032 Left lower quadrant pain: Secondary | ICD-10-CM | POA: Diagnosis not present

## 2022-12-03 DIAGNOSIS — I152 Hypertension secondary to endocrine disorders: Secondary | ICD-10-CM | POA: Diagnosis present

## 2022-12-03 DIAGNOSIS — R109 Unspecified abdominal pain: Secondary | ICD-10-CM | POA: Diagnosis not present

## 2022-12-03 DIAGNOSIS — R6889 Other general symptoms and signs: Secondary | ICD-10-CM | POA: Diagnosis not present

## 2022-12-03 DIAGNOSIS — Z6841 Body Mass Index (BMI) 40.0 and over, adult: Secondary | ICD-10-CM

## 2022-12-03 LAB — COMPREHENSIVE METABOLIC PANEL
ALT: 14 U/L (ref 0–44)
AST: 12 U/L — ABNORMAL LOW (ref 15–41)
Albumin: 3 g/dL — ABNORMAL LOW (ref 3.5–5.0)
Alkaline Phosphatase: 99 U/L (ref 38–126)
Anion gap: 8 (ref 5–15)
BUN: 10 mg/dL (ref 8–23)
CO2: 25 mmol/L (ref 22–32)
Calcium: 8.3 mg/dL — ABNORMAL LOW (ref 8.9–10.3)
Chloride: 105 mmol/L (ref 98–111)
Creatinine, Ser: 0.59 mg/dL (ref 0.44–1.00)
GFR, Estimated: >60 mL/min (ref >60)
Glucose, Bld: 98 mg/dL (ref 70–99)
Potassium: 2.9 mmol/L — ABNORMAL LOW (ref 3.5–5.1)
Sodium: 138 mmol/L (ref 135–145)
Total Bilirubin: 0.3 mg/dL (ref 0.3–1.2)
Total Protein: 6.4 g/dL — ABNORMAL LOW (ref 6.5–8.1)

## 2022-12-03 LAB — CBC WITH DIFFERENTIAL/PLATELET
Abs Immature Granulocytes: 0.06 10*3/uL (ref 0.00–0.07)
Basophils Absolute: 0 10*3/uL (ref 0.0–0.1)
Basophils Relative: 0 %
Eosinophils Absolute: 0.3 10*3/uL (ref 0.0–0.5)
Eosinophils Relative: 2 %
HCT: 34.9 % — ABNORMAL LOW (ref 36.0–46.0)
Hemoglobin: 10.6 g/dL — ABNORMAL LOW (ref 12.0–15.0)
Immature Granulocytes: 1 %
Lymphocytes Relative: 24 %
Lymphs Abs: 2.8 10*3/uL (ref 0.7–4.0)
MCH: 24.6 pg — ABNORMAL LOW (ref 26.0–34.0)
MCHC: 30.4 g/dL (ref 30.0–36.0)
MCV: 81 fL (ref 80.0–100.0)
Monocytes Absolute: 1.1 10*3/uL — ABNORMAL HIGH (ref 0.1–1.0)
Monocytes Relative: 9 %
Neutro Abs: 7.5 10*3/uL (ref 1.7–7.7)
Neutrophils Relative %: 64 %
Platelets: 210 10*3/uL (ref 150–400)
RBC: 4.31 MIL/uL (ref 3.87–5.11)
RDW: 16.8 % — ABNORMAL HIGH (ref 11.5–15.5)
WBC: 11.6 10*3/uL — ABNORMAL HIGH (ref 4.0–10.5)
nRBC: 0 % (ref 0.0–0.2)

## 2022-12-03 LAB — FERRITIN: Ferritin: 41 ng/mL (ref 11–307)

## 2022-12-03 LAB — C DIFFICILE QUICK SCREEN W PCR REFLEX
C Diff antigen: INVALID — AB
C Diff antigen: NEGATIVE
C Diff interpretation: INVALID
C Diff interpretation: NOT DETECTED
C Diff toxin: INVALID — AB
C Diff toxin: NEGATIVE

## 2022-12-03 LAB — MAGNESIUM: Magnesium: 1.9 mg/dL (ref 1.7–2.4)

## 2022-12-03 LAB — URINALYSIS, W/ REFLEX TO CULTURE (INFECTION SUSPECTED)
Bilirubin Urine: NEGATIVE
Glucose, UA: NEGATIVE mg/dL
Hgb urine dipstick: NEGATIVE
Ketones, ur: NEGATIVE mg/dL
Nitrite: POSITIVE — AB
Protein, ur: NEGATIVE mg/dL
Specific Gravity, Urine: >1.046 — ABNORMAL HIGH (ref 1.005–1.030)
pH: 5 (ref 5.0–8.0)

## 2022-12-03 LAB — IRON AND TIBC
Iron: 14 ug/dL — ABNORMAL LOW (ref 28–170)
Saturation Ratios: 4 % — ABNORMAL LOW (ref 10.4–31.8)
TIBC: 321 ug/dL (ref 250–450)
UIBC: 307 ug/dL

## 2022-12-03 MED ORDER — POTASSIUM CHLORIDE CRYS ER 20 MEQ PO TBCR
40.0000 meq | EXTENDED_RELEASE_TABLET | Freq: Once | ORAL | Status: AC
  Administered 2022-12-03: 40 meq via ORAL
  Filled 2022-12-03: qty 2

## 2022-12-03 MED ORDER — SODIUM CHLORIDE 0.9 % IV BOLUS
500.0000 mL | Freq: Once | INTRAVENOUS | Status: AC
  Administered 2022-12-03: 500 mL via INTRAVENOUS

## 2022-12-03 MED ORDER — AMLODIPINE BESYLATE 5 MG PO TABS
5.0000 mg | ORAL_TABLET | Freq: Every day | ORAL | Status: DC
  Administered 2022-12-03 – 2022-12-09 (×7): 5 mg via ORAL
  Filled 2022-12-03 (×7): qty 1

## 2022-12-03 MED ORDER — FESOTERODINE FUMARATE ER 4 MG PO TB24
4.0000 mg | ORAL_TABLET | Freq: Every day | ORAL | Status: DC
  Filled 2022-12-03: qty 1

## 2022-12-03 MED ORDER — ONDANSETRON HCL 4 MG/2ML IJ SOLN
4.0000 mg | Freq: Once | INTRAMUSCULAR | Status: AC
  Administered 2022-12-03: 4 mg via INTRAVENOUS
  Filled 2022-12-03: qty 2

## 2022-12-03 MED ORDER — ACETAMINOPHEN 650 MG RE SUPP: 650.0000 mg | Freq: Four times a day (QID) | RECTAL | Status: DC | PRN

## 2022-12-03 MED ORDER — ONDANSETRON HCL 4 MG PO TABS: 4.0000 mg | ORAL_TABLET | Freq: Four times a day (QID) | ORAL | Status: DC | PRN

## 2022-12-03 MED ORDER — PRAVASTATIN SODIUM 40 MG PO TABS
80.0000 mg | ORAL_TABLET | Freq: Every day | ORAL | Status: DC
  Administered 2022-12-04: 80 mg via ORAL
  Filled 2022-12-03: qty 2

## 2022-12-03 MED ORDER — LOSARTAN POTASSIUM 50 MG PO TABS
100.0000 mg | ORAL_TABLET | Freq: Every day | ORAL | Status: DC
  Administered 2022-12-04 – 2022-12-10 (×7): 100 mg via ORAL
  Filled 2022-12-03 (×7): qty 2

## 2022-12-03 MED ORDER — GABAPENTIN 300 MG PO CAPS
300.0000 mg | ORAL_CAPSULE | Freq: Three times a day (TID) | ORAL | Status: DC
  Administered 2022-12-03 – 2022-12-10 (×20): 300 mg via ORAL
  Filled 2022-12-03 (×20): qty 1

## 2022-12-03 MED ORDER — ENOXAPARIN SODIUM 40 MG/0.4ML IJ SOSY
40.0000 mg | PREFILLED_SYRINGE | INTRAMUSCULAR | Status: DC
  Administered 2022-12-04 – 2022-12-10 (×7): 40 mg via SUBCUTANEOUS
  Filled 2022-12-03 (×7): qty 0.4

## 2022-12-03 MED ORDER — ACETAMINOPHEN 325 MG PO TABS
650.0000 mg | ORAL_TABLET | Freq: Four times a day (QID) | ORAL | Status: DC | PRN
  Administered 2022-12-03 – 2022-12-08 (×7): 650 mg via ORAL
  Filled 2022-12-03 (×7): qty 2

## 2022-12-03 MED ORDER — DULOXETINE HCL 60 MG PO CPEP
60.0000 mg | ORAL_CAPSULE | Freq: Two times a day (BID) | ORAL | Status: DC
  Administered 2022-12-04 – 2022-12-10 (×13): 60 mg via ORAL
  Filled 2022-12-03 (×13): qty 1

## 2022-12-03 MED ORDER — ONDANSETRON HCL 4 MG/2ML IJ SOLN: 4.0000 mg | Freq: Four times a day (QID) | INTRAMUSCULAR | Status: DC | PRN

## 2022-12-03 MED ORDER — IOHEXOL 300 MG/ML  SOLN
100.0000 mL | Freq: Once | INTRAMUSCULAR | Status: AC | PRN
  Administered 2022-12-03: 100 mL via INTRAVENOUS

## 2022-12-03 MED ORDER — BUPROPION HCL ER (XL) 150 MG PO TB24
150.0000 mg | ORAL_TABLET | Freq: Every day | ORAL | Status: DC
  Administered 2022-12-03 – 2022-12-09 (×7): 150 mg via ORAL
  Filled 2022-12-03 (×7): qty 1

## 2022-12-03 MED ORDER — SODIUM CHLORIDE 0.9 % IV SOLN: INTRAVENOUS | Status: DC

## 2022-12-03 MED ORDER — SODIUM CHLORIDE 0.9 % IV SOLN
1.0000 g | Freq: Once | INTRAVENOUS | Status: AC
  Administered 2022-12-03: 1 g via INTRAVENOUS
  Filled 2022-12-03: qty 10

## 2022-12-03 MED ORDER — POTASSIUM CHLORIDE 10 MEQ/100ML IV SOLN
10.0000 meq | INTRAVENOUS | Status: AC
  Administered 2022-12-03 (×3): 10 meq via INTRAVENOUS
  Filled 2022-12-03 (×3): qty 100

## 2022-12-03 NOTE — ED Provider Notes (Signed)
[place] EMERGENCY DEPARTMENT AT [facility] Provider Note   CSN: [id number] Arrival date & time: 12/03/22  1243     History  Chief Complaint  Patient presents with   Nausea   Diarrhea    Tiffany Brown is a 77 y.o. female.  She has PMH of hypertension, high cholesterol, venous stasis, type 2 diabetes, lymphedema. She comes the ER today complaining of generalized weakness with 3 days of diarrhea with nausea, no vomiting.  No blood in her stool.  Was discharged home from rehab recently, had been treated for UTI with antibiotics about 2 weeks ago.  Denies syncope, no chest pain or shortness of breath, complains of mild lower abdominal pain.   Diarrhea      Home Medications Prior to Admission medications   Medication Sig Start Date End Date Taking? Authorizing Provider  amLODipine (NORVASC) 5 MG tablet Take 1 tablet (5 mg total) by mouth daily. Patient taking differently: Take 5 mg by mouth at bedtime. 05/17/22   Dettinger, Elige Radon, MD  aspirin 325 MG tablet Take 1 tablet by mouth for 30 days for blood clot prevention 09/14/22   [place], Jesse J, PA-C  buPROPion (WELLBUTRIN XL) 150 MG 24 hr tablet TAKE 1 TABLET BY MOUTH EVERY DAY Patient taking differently: Take 150 mg by mouth at bedtime. 07/15/22   Dettinger, Elige Radon, MD  DULoxetine (CYMBALTA) 60 MG capsule Take 2 capsules (120 mg total) by mouth daily. Patient taking differently: Take 60 mg by mouth 2 (two) times daily. Morning and lunch 05/17/22   Dettinger, Elige Radon, MD  fluticasone (FLONASE) 50 MCG/ACT nasal spray Place 2 sprays into both nostrils daily as needed for rhinitis. 05/17/22   Dettinger, Elige Radon, MD  furosemide (LASIX) 40 MG tablet Take 1 tablet (40 mg total) by mouth 2 (two) times daily as needed. 05/17/22   Dettinger, Elige Radon, MD  gabapentin (NEURONTIN) 300 MG capsule Take 1 capsule (300 mg total) by mouth 3 (three) times daily. 05/29/22   Candelaria Stagers, DPM  HYDROcodone-acetaminophen (NORCO/VICODIN)  5-325 MG tablet Take 1 tablet by mouth every 4-6 hours as needed for pain 09/12/22   [place], Jonna Munro, PA-C  ibuprofen (ADVIL) 800 MG tablet Take 1 tablet (800 mg total) by mouth every 6 (six) hours as needed. Patient taking differently: Take 800 mg by mouth every 6 (six) hours as needed for mild pain or moderate pain. 10/29/21   Candelaria Stagers, DPM  losartan (COZAAR) 100 MG tablet Take 1 tablet (100 mg total) by mouth daily. 05/17/22   Dettinger, Elige Radon, MD  pravastatin (PRAVACHOL) 80 MG tablet Take 1 tablet (80 mg total) by mouth daily. 05/17/22   Dettinger, Elige Radon, MD  Semaglutide, 1 MG/DOSE, 4 MG/3ML SOPN Inject 1 mg as directed once a week. 05/17/22   Dettinger, Elige Radon, MD  solifenacin (VESICARE) 10 MG tablet Take 1 tablet (10 mg total) by mouth daily. 05/17/22   Dettinger, Elige Radon, MD      Allergies    Patient has no known allergies.    Review of Systems   Review of Systems  Gastrointestinal:  Positive for diarrhea.    Physical Exam Updated Vital Signs BP 126/65   Pulse 77   Temp 98.8 F (37.1 C) (Oral)   Resp (!) 22   Ht 5\' 5"  (1.651 m)   Wt 128.4 kg   SpO2 96%   BMI 47.09 kg/m  Physical Exam Vitals and nursing note reviewed.  Constitutional:  General: She is not in acute distress.    Appearance: She is well-developed.  HENT:     Head: Normocephalic and atraumatic.     Mouth/Throat:     Mouth: Mucous membranes are moist.  Eyes:     Conjunctiva/sclera: Conjunctivae normal.  Cardiovascular:     Rate and Rhythm: Normal rate and regular rhythm.     Heart sounds: No murmur heard. Pulmonary:     Effort: Pulmonary effort is normal. No respiratory distress.     Breath sounds: Normal breath sounds.  Abdominal:     Palpations: Abdomen is soft.     Tenderness: There is abdominal tenderness in the suprapubic area and left lower quadrant. There is no guarding or rebound.  Musculoskeletal:        General: No swelling.     Cervical back: Neck supple.  Skin:    General:  Skin is warm and dry.     Capillary Refill: Capillary refill takes less than 2 seconds.  Neurological:     General: No focal deficit present.     Mental Status: She is alert and oriented to person, place, and time.  Psychiatric:        Mood and Affect: Mood normal.     ED Results / Procedures / Treatments   Labs (all labs ordered are listed, but only abnormal results are displayed) Labs Reviewed  C DIFFICILE QUICK SCREEN W PCR REFLEX   - Abnormal; Notable for the following components:      Result Value   C Diff antigen   (*)    Value: INVALID, POSSIBLE SAMPLE INTEGRITY COMPROMISED, TEST WAS REPEATED   C Diff toxin   (*)    Value: INVALID, POSSIBLE SAMPLE INTEGRITY COMPROMISED, TEST WAS REPEATED   All other components within normal limits  CBC WITH DIFFERENTIAL/PLATELET - Abnormal; Notable for the following components:   WBC 11.6 (*)    Hemoglobin 10.6 (*)    HCT 34.9 (*)    MCH 24.6 (*)    RDW 16.8 (*)    Monocytes Absolute 1.1 (*)    All other components within normal limits  COMPREHENSIVE METABOLIC PANEL - Abnormal; Notable for the following components:   Potassium 2.9 (*)    Calcium 8.3 (*)    Total Protein 6.4 (*)    Albumin 3.0 (*)    AST 12 (*)    All other components within normal limits  URINALYSIS, W/ REFLEX TO CULTURE (INFECTION SUSPECTED) - Abnormal; Notable for the following components:   Specific Gravity, Urine >1.046 (*)    Nitrite POSITIVE (*)    Leukocytes,Ua SMALL (*)    Bacteria, UA RARE (*)    All other components within normal limits  GASTROINTESTINAL PANEL BY PCR, STOOL (REPLACES STOOL CULTURE)  URINE CULTURE  MAGNESIUM  IRON AND TIBC  FERRITIN    EKG None  Radiology CT ABDOMEN PELVIS W CONTRAST  Result Date: 12/03/2022 CLINICAL DATA:  Left lower quadrant pain, nausea, and diarrhea for 3 days. EXAM: CT ABDOMEN AND PELVIS WITH CONTRAST TECHNIQUE: Multidetector CT imaging of the abdomen and pelvis was performed using the standard protocol  following bolus administration of intravenous contrast. RADIATION DOSE REDUCTION: This exam was performed according to the departmental dose-optimization program which includes automated exposure control, adjustment of the mA and/or kV according to patient size and/or use of iterative reconstruction technique. CONTRAST:  OMNIPAQUE IOHEXOL 300 MG/ML  SOLN COMPARISON:  07/13/2019 FINDINGS: Lower Chest: No acute findings. Hepatobiliary: No suspicious hepatic masses  identified. Prior cholecystectomy. No evidence of biliary obstruction. Pancreas:  No mass or inflammatory changes. Spleen: Within normal limits in size and appearance. Adrenals/Urinary Tract: No suspicious masses identified. 2 mm right renal calculus noted. No evidence of ureteral calculi or hydronephrosis. Stomach/Bowel: Long segment colonic wall thickening is seen involving the majority of the descending and sigmoid colon, with mild pericolonic soft tissue stranding. This is consistent with moderate colitis. No No evidence of abscess or bowel obstruction. Normal appendix visualized. Vascular/Lymphatic: No pathologically enlarged lymph nodes. No acute vascular findings. Reproductive: Prior hysterectomy noted. Adnexal regions are unremarkable in appearance. Other:  None. Musculoskeletal:  No suspicious bone lesions identified. IMPRESSION: Moderate colitis involving the descending and sigmoid colon. No evidence of abscess or bowel obstruction. Tiny nonobstructing right renal calculus. No evidence of ureteral calculi or hydronephrosis. Electronically Signed   By: Danae Orleans M.D.   On: 12/03/2022 16:08    Procedures Procedures    Medications Ordered in ED Medications  potassium chloride 10 mEq in 100 mL IVPB (10 mEq Intravenous New Bag/Given 12/03/22 1756)  sodium chloride 0.9 % bolus 500 mL (0 mLs Intravenous Stopped 12/03/22 1530)  ondansetron (ZOFRAN) injection 4 mg (4 mg Intravenous Given 12/03/22 1402)  iohexol (OMNIPAQUE) 300 MG/ML  solution 100 mL (100 mLs Intravenous Contrast Given 12/03/22 1459)  potassium chloride SA (KLOR-CON M) CR tablet 40 mEq (40 mEq Oral Given 12/03/22 1634)    ED Course/ Medical Decision Making/ A&P                                 Medical Decision Making This patient presents to the ED for concern of diarrhea, nausea, generalized weakness with lower abdominal pain, this involves an extensive number of treatment options, and is a complaint that carries with it a high risk of complications and morbidity.  The differential diagnosis includes colitis, diverticulitis, C. difficile colitis, dehydration, electrolyte abnormality, other   Co morbidities that complicate the patient evaluation :   Diabetes, osteoarthritis   Additional history obtained:  Additional history obtained from EMR External records from outside source obtained and reviewed including notes   Lab Tests:  I Ordered, and personally interpreted labs.  The pertinent results include: CBC shows mild leukocytosis 11.6, mild anemia at 10.6 which is around her baseline, CMP shows hypokalemia at 2.9, urinalysis has positive nitrate and small leuks with 11-20 white blood cells and rare bacteria   Imaging Studies ordered:  I ordered imaging studies including CT abdomen pelvis I independently visualized and interpreted imaging which showed colitis of descending and sigmoid colon I agree with the radiologist interpretation   Cardiac Monitoring: / EKG:  The patient was maintained on a cardiac monitor.  I personally viewed and interpreted the cardiac monitored which showed an underlying rhythm of: Sinus rhythm   Consultations Obtained:  I requested consultation with the hospitalist,  and discussed lab and imaging findings as well as pertinent plan - they recommend: Admit patient for observation as she is having a lot of trouble getting around at home not able to take care of herself in the setting of colitis   Problem List / ED  Course / Critical interventions / Medication management  Diarrhea-patient on recent antibiotics for UTI, having significant diarrhea and lower abdominal pain.  She has colitis on her CT.  Antibiotics on hold until her C. difficile test is back due to recent antibiotics.  Labs that show hypokalemia which  is being repleted.  Renal function is normal, urinalysis shows positive nitrate small leuks and 11-20 white blood cells.  She has no UTI symptoms.  No dysuria frequency urgency.  Sent for culture. Patient did not feel comfortable going home, she still not able to ambulate, had to leave the nursing home early due to running out of days, husband's has not been able to take care of her.  Discussed with hospitalist who was willing to admit.  Understands may only be observation but was nonetheless happy with this decision I ordered medication including Zofran, IV fluids, potassium for nausea, generalized weakness Reevaluation of the patient after these medicines showed that the patient improved mildly I have reviewed the patients home medicines and have made adjustments as needed   Social Determinants of Health:  Lives at home     Amount and/or Complexity of Data Reviewed Labs: ordered. Radiology: ordered.  Risk Prescription drug management. Decision regarding hospitalization.           Final Clinical Impression(s) / ED Diagnoses Final diagnoses:  Colitis  Hypokalemia    Rx / DC Orders ED Discharge Orders     None         Josem Kaufmann 12/03/22 1847    Gerhard Munch, MD 12/04/22 [phone]

## 2022-12-03 NOTE — Assessment & Plan Note (Signed)
Secondary to exogenous losses from diarrhea Check magnesium Repleted in ED Telemetry and trend

## 2022-12-03 NOTE — ED Triage Notes (Signed)
Pt [facility] for nausea x 2 days and diarrhea x 3 days. Just got out of rehab a week ago. Was being treated for UTI and had a foley catheter until about 2 weeks ago and did antibiotic treatment for the UTI. VS WNL at bedside .

## 2022-12-03 NOTE — Assessment & Plan Note (Signed)
TTP suprapubic/lower quadrants, could be colitis UA with +nitrite F/u on culture Given one dose of rocephin

## 2022-12-03 NOTE — Assessment & Plan Note (Signed)
Continue losartan 100mg  daily and norvasc 5mg  daily  Holding her lasix in setting of intractable diarrhea

## 2022-12-03 NOTE — Assessment & Plan Note (Signed)
Likely from surgery Hgb trending upward Check iron studies

## 2022-12-03 NOTE — ED Notes (Signed)
Pt states she is unable to provide urine or stool sample at this time.

## 2022-12-03 NOTE — Assessment & Plan Note (Addendum)
Left rehab one week ago due to funds no longer covering LTC rehab.  Bed bound and husband can not get her to bathroom or take care of her on his own with her current status  Debilitated PT/OT eval. May need more rehab  SW consult

## 2022-12-03 NOTE — ED Notes (Signed)
Patient transported to CT. Will start next IV Potassium when she returns.

## 2022-12-03 NOTE — ED Notes (Signed)
Received call from pharmacy that not enough sample to perform C-diff testing.

## 2022-12-03 NOTE — Assessment & Plan Note (Signed)
Continue cymbalta  

## 2022-12-03 NOTE — Assessment & Plan Note (Signed)
A1C in April 2024 was 6.2 On GLP-1 outpatient  SSI and accucheck QAC/HS

## 2022-12-03 NOTE — H&P (Signed)
History and Physical    Patient: Tiffany Brown ZOX:[id number] DOB: [birth year] DOA: 12/03/2022 DOS: the patient was seen and examined on 12/03/2022 PCP: Dettinger, Elige Radon, MD  Patient coming from: Home - lives with her husband. Bed bound. Prior to surgery she was up and moving.    Chief Complaint: 3 day history of nausea and diarrhea   HPI: Tiffany Brown is a 77 y.o. female with medical history significant of depression and anxiety, HTN, GERD, fibromyalgia, melanoma, HLD, prediabetes who presented to Ed with complaints of nausea and diarrhea x 3 days.  She denies any blood in her stool and states just copious brown water. She states it is foul smelling. She denies eating any raw or undercooked foods or left overs. No travel.  She has intermittent lower abdomen pain. She states the pain is better after a BM. Pain rated as an 8/10 and is sharp in nature. No radiation. Eating or drinking seems to make the pain worse. She has been unable to get to the bathroom at home and can not get to the bathroom quick enough. She is pretty much bed bound. She was non weight bearing x 6-8 weeks. After this she was only supposed to have light touch and go.   She had surgery on her right foot on May 7.2024 by Dr. Susa Simmonds.  She went to rehab following surgery, [facility]. She doesn't feel like she was getting very much rehab there and was discharged home on 11/25/22.  Her insurance would no longer pay for her to stay. Her husband is having a hard time taking care of her. She also states she was treated for a UTI about 3 weeks ago at her rehab. She was symptomatic at that time.    Denies any fever/chills, vision changes/headaches, chest pain or palpitations, shortness of breath or cough, dysuria or leg swelling.    ER Course:  vitals: afebrile, bp: 126/65, HR: 77, RR: 22, oxygen: 96%RA Pertinent labs: wbc: 11.6, hgb: 10.6, potassium: 2.9, UA: +nitrite, small LE,  CT abdomen/pelvis: moderate colitis involving the  descending and sigmoid colon. No abscess/obstruction. Tiny non obstructing right renal calculus.  In ED: stool study/c.diff pending. Enteric precautions. Given 500cc bolus, potassium replacement and TRH asked to admit.    Review of Systems: As mentioned in the history of present illness. All other systems reviewed and are negative. Past Medical History:  Diagnosis Date   Abnormal glucose    Arthritis    Depression with anxiety    Essential hypertension, benign    Fibromyalgia    GERD (gastroesophageal reflux disease)    Lymphedema of both lower extremities    and stomach   Melanoma (HCC)    left arm and right arm   Near syncope    Other and unspecified hyperlipidemia    Pre-diabetes    Past Surgical History:  Procedure Laterality Date   ABDOMINAL HYSTERECTOMY     ANKLE ARTHROSCOPY WITH ARTHRODESIS Right 09/10/2022   Procedure: RIGHT EXTENSIVE MIDFOOT CAPSULOTOMY WITH POSTERIOR TIBIAL TENDON LENGTHENING AND ANKLE CAPSULAR RELEASE,  TIBIAL TALOCALCANEAL ARTHRODESIS VERSUS DOUBLE ARTHRODESIS;  Surgeon: Terance Hart, MD;  Location: [facility] OR;  Service: Orthopedics;  Laterality: Right;  LENGTH OF SURGERY: 180 MINUTES   BREAST SURGERY     LEFT   CHOLECYSTECTOMY     ELBOW SURGERY     X's 3 Left   IR ABLATE LIVER CRYOABLATION  02/15/2021   IR RADIOLOGIST EVAL & MGMT  02/08/2021   IR RADIOLOGIST  EVAL & MGMT  03/14/2021   MELANOMA EXCISION Left 10/24/2021   Procedure: WIDE LOCAL EXCISION ADVANCEMENT FLAP CLOSURE LEFT UYPPER ARM MELANOMA;  Surgeon: Almond Lint, MD;  Location: [facility] OR;  Service: General;  Laterality: Left;   RIGHT DISTAL RADIUS FRACTURE     TOTAL KNEE ARTHROPLASTY     RIGHT   Social History:  reports that she quit smoking about 28 years ago. Her smoking use included cigarettes. She started smoking about 53 years ago. She has a 20 pack-year smoking history. She has been exposed to tobacco smoke. She has never used smokeless tobacco. She reports that she does not drink  alcohol and does not use drugs.  No Known Allergies  Family History  Problem Relation Age of Onset   Cancer Mother        lymphoma   Suicidality Father    Lung cancer Sister    Heart disease Maternal Grandmother        no details   Breast cancer Neg Hx     Prior to Admission medications   Medication Sig Start Date End Date Taking? Authorizing Provider  amLODipine (NORVASC) 5 MG tablet Take 1 tablet (5 mg total) by mouth daily. Patient taking differently: Take 5 mg by mouth at bedtime. 05/17/22   Dettinger, Elige Radon, MD  aspirin 325 MG tablet Take 1 tablet by mouth for 30 days for blood clot prevention 09/14/22   [place], Jesse J, PA-C  buPROPion (WELLBUTRIN XL) 150 MG 24 hr tablet TAKE 1 TABLET BY MOUTH EVERY DAY Patient taking differently: Take 150 mg by mouth at bedtime. 07/15/22   Dettinger, Elige Radon, MD  DULoxetine (CYMBALTA) 60 MG capsule Take 2 capsules (120 mg total) by mouth daily. Patient taking differently: Take 60 mg by mouth 2 (two) times daily. Morning and lunch 05/17/22   Dettinger, Elige Radon, MD  fluticasone (FLONASE) 50 MCG/ACT nasal spray Place 2 sprays into both nostrils daily as needed for rhinitis. 05/17/22   Dettinger, Elige Radon, MD  furosemide (LASIX) 40 MG tablet Take 1 tablet (40 mg total) by mouth 2 (two) times daily as needed. 05/17/22   Dettinger, Elige Radon, MD  gabapentin (NEURONTIN) 300 MG capsule Take 1 capsule (300 mg total) by mouth 3 (three) times daily. 05/29/22   Candelaria Stagers, DPM  HYDROcodone-acetaminophen (NORCO/VICODIN) 5-325 MG tablet Take 1 tablet by mouth every 4-6 hours as needed for pain 09/12/22   [place], Jonna Munro, PA-C  ibuprofen (ADVIL) 800 MG tablet Take 1 tablet (800 mg total) by mouth every 6 (six) hours as needed. Patient taking differently: Take 800 mg by mouth every 6 (six) hours as needed for mild pain or moderate pain. 10/29/21   Candelaria Stagers, DPM  losartan (COZAAR) 100 MG tablet Take 1 tablet (100 mg total) by mouth daily. 05/17/22   Dettinger,  Elige Radon, MD  pravastatin (PRAVACHOL) 80 MG tablet Take 1 tablet (80 mg total) by mouth daily. 05/17/22   Dettinger, Elige Radon, MD  Semaglutide, 1 MG/DOSE, 4 MG/3ML SOPN Inject 1 mg as directed once a week. 05/17/22   Dettinger, Elige Radon, MD  solifenacin (VESICARE) 10 MG tablet Take 1 tablet (10 mg total) by mouth daily. 05/17/22   Dettinger, Elige Radon, MD    Physical Exam: Vitals:   12/03/22 1730 12/03/22 1736 12/03/22 1743 12/03/22 1940  BP: 126/65     Pulse: 77     Resp:      Temp:  98.6 F (37 C) 98.8 F (  37.1 C)   TempSrc:  Oral Oral   SpO2: 96%   96%  Weight:      Height:       General:  Appears calm and comfortable and is in NAD. Obese Eyes:  PERRL, EOMI, normal lids, iris ENT:  grossly normal hearing, lips & tongue, dry mucous membranes; appropriate dentition Neck:  no LAD, masses or thyromegaly; no carotid bruits Cardiovascular:  RRR, no m/r/g. 1+ LE edema.  Respiratory:   CTA bilaterally with no wheezes/rales/rhonchi.  Normal respiratory effort. Abdomen:  soft, TTP suprapubic/lower quadrants, ND, NABS Back:   normal alignment, no CVAT Skin:  no rash or induration seen on limited exam Musculoskeletal:  grossly normal tone BUE/BLE, good ROM, no bony abnormality Lower extremity:   Limited foot exam with no ulcerations.  2+ distal pulses. Psychiatric:  grossly normal mood and affect, speech fluent and appropriate, AOx3 Neurologic:  CN 2-12 grossly intact, moves all extremities in coordinated fashion, sensation intact   Radiological Exams on Admission: Independently reviewed - see discussion in A/P where applicable  CT ABDOMEN PELVIS W CONTRAST  Result Date: 12/03/2022 CLINICAL DATA:  Left lower quadrant pain, nausea, and diarrhea for 3 days. EXAM: CT ABDOMEN AND PELVIS WITH CONTRAST TECHNIQUE: Multidetector CT imaging of the abdomen and pelvis was performed using the standard protocol following bolus administration of intravenous contrast. RADIATION DOSE REDUCTION: This exam  was performed according to the departmental dose-optimization program which includes automated exposure control, adjustment of the mA and/or kV according to patient size and/or use of iterative reconstruction technique. CONTRAST:  OMNIPAQUE IOHEXOL 300 MG/ML  SOLN COMPARISON:  07/13/2019 FINDINGS: Lower Chest: No acute findings. Hepatobiliary: No suspicious hepatic masses identified. Prior cholecystectomy. No evidence of biliary obstruction. Pancreas:  No mass or inflammatory changes. Spleen: Within normal limits in size and appearance. Adrenals/Urinary Tract: No suspicious masses identified. 2 mm right renal calculus noted. No evidence of ureteral calculi or hydronephrosis. Stomach/Bowel: Long segment colonic wall thickening is seen involving the majority of the descending and sigmoid colon, with mild pericolonic soft tissue stranding. This is consistent with moderate colitis. No No evidence of abscess or bowel obstruction. Normal appendix visualized. Vascular/Lymphatic: No pathologically enlarged lymph nodes. No acute vascular findings. Reproductive: Prior hysterectomy noted. Adnexal regions are unremarkable in appearance. Other:  None. Musculoskeletal:  No suspicious bone lesions identified. IMPRESSION: Moderate colitis involving the descending and sigmoid colon. No evidence of abscess or bowel obstruction. Tiny nonobstructing right renal calculus. No evidence of ureteral calculi or hydronephrosis. Electronically Signed   By: Danae Orleans M.D.   On: 12/03/2022 16:08    EKG: Independently reviewed.  NSR with rate 78; nonspecific ST changes with no evidence of acute ischemia   Labs on Admission: I have personally reviewed the available labs and imaging studies at the time of the admission.  Pertinent labs:   wbc: 11.6,  hgb: 10.6,  potassium: 2.9,  UA: +nitrite, small LE,  Assessment and Plan: Principal Problem:   Colitis Active Problems:   Hypokalemia   Normocytic anemia   Cystitis    Ambulatory dysfunction   DM type 2 with diabetic dyslipidemia (HCC)   Essential hypertension, benign   Hyperlipidemia   Recurrent major depressive disorder, in full remission (HCC)   Fibromyalgia    Assessment and Plan: * Colitis 77 year old female presenting with 3 day history of copious amounts of diarrhea and nausea found to have colitis on imaging with subsequent weakness and debility  -obs to tele -gentle  IVF -stool PCR and c.diff pending -enteric precautions -just got out of rehab for foot surgery about one week ago, c.diff high on differential.  -CT abdomen/pelvis with no abscess/obstruction. Moderate colitis  Supportive care, f/u on stool studies -anti-emetics   Hypokalemia Secondary to exogenous losses from diarrhea Check magnesium Repleted in ED Telemetry and trend   Cystitis TTP suprapubic/lower quadrants, could be colitis UA with +nitrite F/u on culture Given one dose of rocephin   Normocytic anemia Likely from surgery Hgb trending upward Check iron studies   Ambulatory dysfunction Left rehab one week ago due to funds no longer covering LTC rehab.  Bed bound and husband can not get her to bathroom or take care of her on his own with her current status  Debilitated PT/OT eval. May need more rehab  SW consult   DM type 2 with diabetic dyslipidemia (HCC) A1C in April 2024 was 6.2 On GLP-1 outpatient  SSI and accucheck QAC/HS   Essential hypertension, benign Continue losartan 100mg  daily and norvasc 5mg  daily  Holding her lasix in setting of intractable diarrhea   Hyperlipidemia Continue pravastatin 80mg    Recurrent major depressive disorder, in full remission (HCC) Continue cymbalta and wellbutrin   Fibromyalgia Continue cymbalta     Advance Care Planning:   Code Status: Full Code   Consults: PT/OT/SW   DVT Prophylaxis: lovenox   Family Communication: updated her husband by phone. Junior Macchia   Severity of Illness: The appropriate  patient status for this patient is OBSERVATION. Observation status is judged to be reasonable and necessary in order to provide the required intensity of service to ensure the patient's safety. The patient's presenting symptoms, physical exam findings, and initial radiographic and laboratory data in the context of their medical condition is felt to place them at decreased risk for further clinical deterioration. Furthermore, it is anticipated that the patient will be medically stable for discharge from the hospital within 2 midnights of admission.   Author: Orland Mustard, MD 12/03/2022 8:05 PM  For on call review [email or web address].

## 2022-12-03 NOTE — Assessment & Plan Note (Signed)
77 year old female presenting with 3 day history of copious amounts of diarrhea and nausea found to have colitis on imaging with subsequent weakness and debility  -obs to tele -gentle IVF -stool PCR and c.diff pending -enteric precautions -just got out of rehab for foot surgery about one week ago, c.diff high on differential.  -CT abdomen/pelvis with no abscess/obstruction. Moderate colitis  Supportive care, f/u on stool studies -anti-emetics

## 2022-12-03 NOTE — Assessment & Plan Note (Signed)
Continue cymbalta and wellbutrin

## 2022-12-03 NOTE — Assessment & Plan Note (Signed)
Continue pravastatin 80mg 

## 2022-12-04 ENCOUNTER — Observation Stay (HOSPITAL_COMMUNITY): Payer: Medicare Other

## 2022-12-04 DIAGNOSIS — Z7401 Bed confinement status: Secondary | ICD-10-CM | POA: Diagnosis not present

## 2022-12-04 DIAGNOSIS — Z981 Arthrodesis status: Secondary | ICD-10-CM | POA: Diagnosis not present

## 2022-12-04 DIAGNOSIS — N3281 Overactive bladder: Secondary | ICD-10-CM | POA: Diagnosis present

## 2022-12-04 DIAGNOSIS — D509 Iron deficiency anemia, unspecified: Secondary | ICD-10-CM | POA: Diagnosis present

## 2022-12-04 DIAGNOSIS — M797 Fibromyalgia: Secondary | ICD-10-CM | POA: Diagnosis not present

## 2022-12-04 DIAGNOSIS — E1169 Type 2 diabetes mellitus with other specified complication: Secondary | ICD-10-CM | POA: Diagnosis not present

## 2022-12-04 DIAGNOSIS — B9620 Unspecified Escherichia coli [E. coli] as the cause of diseases classified elsewhere: Secondary | ICD-10-CM | POA: Diagnosis present

## 2022-12-04 DIAGNOSIS — J9601 Acute respiratory failure with hypoxia: Secondary | ICD-10-CM | POA: Diagnosis not present

## 2022-12-04 DIAGNOSIS — E7800 Pure hypercholesterolemia, unspecified: Secondary | ICD-10-CM | POA: Diagnosis present

## 2022-12-04 DIAGNOSIS — Z801 Family history of malignant neoplasm of trachea, bronchus and lung: Secondary | ICD-10-CM | POA: Diagnosis not present

## 2022-12-04 DIAGNOSIS — K219 Gastro-esophageal reflux disease without esophagitis: Secondary | ICD-10-CM | POA: Diagnosis present

## 2022-12-04 DIAGNOSIS — M5136 Other intervertebral disc degeneration, lumbar region: Secondary | ICD-10-CM | POA: Diagnosis not present

## 2022-12-04 DIAGNOSIS — Z6841 Body Mass Index (BMI) 40.0 and over, adult: Secondary | ICD-10-CM | POA: Diagnosis not present

## 2022-12-04 DIAGNOSIS — M67874 Other specified disorders of tendon, left ankle and foot: Secondary | ICD-10-CM | POA: Diagnosis not present

## 2022-12-04 DIAGNOSIS — M67873 Other specified disorders of tendon, right ankle and foot: Secondary | ICD-10-CM | POA: Diagnosis not present

## 2022-12-04 DIAGNOSIS — Z85820 Personal history of malignant melanoma of skin: Secondary | ICD-10-CM | POA: Diagnosis not present

## 2022-12-04 DIAGNOSIS — R54 Age-related physical debility: Secondary | ICD-10-CM | POA: Diagnosis present

## 2022-12-04 DIAGNOSIS — Z96651 Presence of right artificial knee joint: Secondary | ICD-10-CM | POA: Diagnosis present

## 2022-12-04 DIAGNOSIS — Z87891 Personal history of nicotine dependence: Secondary | ICD-10-CM | POA: Diagnosis not present

## 2022-12-04 DIAGNOSIS — F32A Depression, unspecified: Secondary | ICD-10-CM | POA: Diagnosis present

## 2022-12-04 DIAGNOSIS — R262 Difficulty in walking, not elsewhere classified: Secondary | ICD-10-CM | POA: Diagnosis not present

## 2022-12-04 DIAGNOSIS — M79671 Pain in right foot: Secondary | ICD-10-CM | POA: Diagnosis not present

## 2022-12-04 DIAGNOSIS — Z8249 Family history of ischemic heart disease and other diseases of the circulatory system: Secondary | ICD-10-CM | POA: Diagnosis not present

## 2022-12-04 DIAGNOSIS — D649 Anemia, unspecified: Secondary | ICD-10-CM | POA: Diagnosis not present

## 2022-12-04 DIAGNOSIS — K529 Noninfective gastroenteritis and colitis, unspecified: Secondary | ICD-10-CM | POA: Diagnosis not present

## 2022-12-04 DIAGNOSIS — Z807 Family history of other malignant neoplasms of lymphoid, hematopoietic and related tissues: Secondary | ICD-10-CM | POA: Diagnosis not present

## 2022-12-04 DIAGNOSIS — E785 Hyperlipidemia, unspecified: Secondary | ICD-10-CM | POA: Diagnosis not present

## 2022-12-04 DIAGNOSIS — E876 Hypokalemia: Secondary | ICD-10-CM | POA: Diagnosis not present

## 2022-12-04 DIAGNOSIS — N3090 Cystitis, unspecified without hematuria: Secondary | ICD-10-CM | POA: Diagnosis not present

## 2022-12-04 DIAGNOSIS — R6889 Other general symptoms and signs: Secondary | ICD-10-CM | POA: Diagnosis not present

## 2022-12-04 DIAGNOSIS — R9389 Abnormal findings on diagnostic imaging of other specified body structures: Secondary | ICD-10-CM | POA: Diagnosis not present

## 2022-12-04 DIAGNOSIS — I10 Essential (primary) hypertension: Secondary | ICD-10-CM | POA: Diagnosis not present

## 2022-12-04 DIAGNOSIS — M6281 Muscle weakness (generalized): Secondary | ICD-10-CM | POA: Diagnosis not present

## 2022-12-04 DIAGNOSIS — I872 Venous insufficiency (chronic) (peripheral): Secondary | ICD-10-CM | POA: Diagnosis not present

## 2022-12-04 DIAGNOSIS — Z79899 Other long term (current) drug therapy: Secondary | ICD-10-CM | POA: Diagnosis not present

## 2022-12-04 LAB — OCCULT BLOOD X 1 CARD TO LAB, STOOL: Fecal Occult Bld: POSITIVE — AB

## 2022-12-04 MED ORDER — PRAVASTATIN SODIUM 40 MG PO TABS
80.0000 mg | ORAL_TABLET | Freq: Every day | ORAL | Status: DC
  Administered 2022-12-05 – 2022-12-09 (×5): 80 mg via ORAL
  Filled 2022-12-04 (×5): qty 2

## 2022-12-04 MED ORDER — LOPERAMIDE HCL 2 MG PO CAPS
2.0000 mg | ORAL_CAPSULE | Freq: Three times a day (TID) | ORAL | Status: DC
  Administered 2022-12-04 – 2022-12-06 (×5): 2 mg via ORAL
  Filled 2022-12-04 (×5): qty 1

## 2022-12-04 MED ORDER — KCL IN DEXTROSE-NACL 40-5-0.45 MEQ/L-%-% IV SOLN: INTRAVENOUS | Status: DC

## 2022-12-04 MED ORDER — SODIUM CHLORIDE 0.9 % IV SOLN
2.0000 g | INTRAVENOUS | Status: DC
  Administered 2022-12-04 – 2022-12-06 (×3): 2 g via INTRAVENOUS
  Filled 2022-12-04 (×3): qty 20

## 2022-12-04 MED ORDER — METRONIDAZOLE 500 MG PO TABS
500.0000 mg | ORAL_TABLET | Freq: Two times a day (BID) | ORAL | Status: DC
  Administered 2022-12-04 – 2022-12-07 (×7): 500 mg via ORAL
  Filled 2022-12-04 (×7): qty 1

## 2022-12-04 MED ORDER — SODIUM CHLORIDE 0.45 % IV SOLN: INTRAVENOUS | Status: DC

## 2022-12-04 NOTE — Progress Notes (Signed)
[facility] PROGRESS NOTE  Tiffany Brown (DOB: 1945-11-23) UEA:[id number] PCP: Dettinger, Elige Radon, MD  Brief Narrative: Tiffany Brown is a 77 y.o. female with a history of depression and anxiety, HTN, GERD, fibromyalgia, melanoma, HLD, prediabetes who presented to the ED on 12/03/2022 with 3 days of nausea and watery diarrhea. She had been at rehab after foot surgery 5/7 - 7/22, recently discharged home, was treated for UTI approximately 3 weeks ago, and still noticed significant debility. In the ED she was afebrile with leukocytosis (WBC 11.6k), hypokalemic, nitrite-positive pyuria on UA, and CT abd/pelvis demonstrating moderate descending/sigmoid colitis. GI pathogen panel and C. diff assay collected and the patient was admitted. She has profuse watery diarrhea requiring rectal tube, though studies have been negative. Ceftriaxone and flagyl as well as IVF started.  Subjective: Still with loose stool requiring rectal tube. No fever now or previously. No current abd pain, +nausea, will be trying to eat later today.   Objective: BP (!) 103/51 (BP Location: Left Arm)   Pulse 70   Temp 98.1 F (36.7 C) (Oral)   Resp 16   Ht 5\' 5"  (1.651 m)   Wt 128.4 kg   SpO2 94%   BMI 47.09 kg/m   Gen: Obese, frail female in no acute distress Pulm: Clear, nonlabored  CV: RRR, no MRG or pitting edema GI: Soft, minimally tender on left without rebound or guarding. Adiposity does limit exam. +BS Neuro: Alert and oriented. No new focal deficits. Ext: Warm, no deformities. Skin: No new rashes, lesions or ulcers on visualized skin   Assessment & Plan: Descending, sigmoid colitis: Negative GI panel and C. diff, though has recent healthcare exposure and abx. No acidosis, reassuring exam. No bleeding. - Continue ceftriaxone, flagyl for now. WBC normalized, Tmax 99.42F  - Start imodium TID  UTI: Presumed based on symptoms and pyuria. >100k E. coli growing.  - Ceftriaxone already planned as above.    Hypokalemia: Primarily suspect GI losses, inadequate po replenishment  - Supplemented, will start continuous K supp in maintenance IVF. Monitor in AM with Mg  Hypochromic anemia: With ferritin normal (41), iron (14) and % sat (4%) are low.  - Would likely benefit from iron supplementation once infection is treated.  - Recheck CBC in AM to confirm stability (hgb 10.6 > 9.7 w/hemodilution) - Continue VTE ppx lovenox for now, check FOBT  HTN:  - Cotninue losartan, norvasc; hold loop diuretic   Right foot/ankle surgery 5/7, now w/gait impairment, debility:  - PT/OT consulted > SNF to be pursued, TOC aware.  - Pain control - Orthopedics follow up per routine  Depression:  - Continue wellbutrin, cymbalta  HLD:  - Continue statin  OAB:  - Pt had hx urinary retention, will hold fesoterodine for now.   Morbid obesity: Body mass index is 47.09 kg/m.   Tyrone Nine, MD [facility] [email or web address] 12/04/2022, 4:34 PM

## 2022-12-04 NOTE — ED Notes (Signed)
Patient status is over 40 minutes after bed assigned. 300 secretary called to report pt will be brought up to her room in 306.

## 2022-12-04 NOTE — Progress Notes (Signed)
RE: Tiffany Brown  Date Of Birth: [birth year] Date: 12/04/2022  MUST ID: [id number]  To Whom It May Concern:   Please be advised that the above name patient will require a short-term nursing home stay - anticipated 30 days or less rehabilitation and strengthening. The plan is for return home.

## 2022-12-04 NOTE — ED Notes (Addendum)
ED TO INPATIENT HANDOFF REPORT  ED Nurse Name and Phone #: Fransico Him, RN [phone]  S Name/Age/Gender Tiffany Brown 77 y.o. female Room/Bed: APA02/APA02  Code Status   Code Status: Full Code  Home/SNF/Other Rehab Patient oriented to: self, place, time, and situation Is this baseline? Yes   Triage Complete: Triage complete  Chief Complaint Colitis [K52.9]  Triage Note [facility] for nausea x 2 days and diarrhea x 3 days. Just got out of rehab a week ago. Was being treated for UTI and had a foley catheter until about 2 weeks ago and did antibiotic treatment for the UTI. VS WNL at bedside .   Allergies No Known Allergies  Level of Care/Admitting Diagnosis ED Disposition     ED Disposition  Admit   Condition  --   Comment  Hospital Area: [facility] [[id number]]  Level of Care: Telemetry [5]  Covid Evaluation: Asymptomatic - no recent exposure (last 10 days) testing not required  Diagnosis: Colitis [[id number]]  Admitting Physician: Orland Mustard [[id number]]  Attending Physician: Orland Mustard [[id number]]          B Medical/Surgery History Past Medical History:  Diagnosis Date   Abnormal glucose    Arthritis    Depression with anxiety    Essential hypertension, benign    Fibromyalgia    GERD (gastroesophageal reflux disease)    Lymphedema of both lower extremities    and stomach   Melanoma (HCC)    left arm and right arm   Near syncope    Other and unspecified hyperlipidemia    Pre-diabetes    Past Surgical History:  Procedure Laterality Date   ABDOMINAL HYSTERECTOMY     ANKLE ARTHROSCOPY WITH ARTHRODESIS Right 09/10/2022   Procedure: RIGHT EXTENSIVE MIDFOOT CAPSULOTOMY WITH POSTERIOR TIBIAL TENDON LENGTHENING AND ANKLE CAPSULAR RELEASE,  TIBIAL TALOCALCANEAL ARTHRODESIS VERSUS DOUBLE ARTHRODESIS;  Surgeon: Terance Hart, MD;  Location: [facility] OR;  Service: Orthopedics;  Laterality: Right;  LENGTH OF SURGERY: 180 MINUTES   BREAST SURGERY      LEFT   CHOLECYSTECTOMY     ELBOW SURGERY     X's 3 Left   IR ABLATE LIVER CRYOABLATION  02/15/2021   IR RADIOLOGIST EVAL & MGMT  02/08/2021   IR RADIOLOGIST EVAL & MGMT  03/14/2021   MELANOMA EXCISION Left 10/24/2021   Procedure: WIDE LOCAL EXCISION ADVANCEMENT FLAP CLOSURE LEFT UYPPER ARM MELANOMA;  Surgeon: Almond Lint, MD;  Location: [facility] OR;  Service: General;  Laterality: Left;   RIGHT DISTAL RADIUS FRACTURE     TOTAL KNEE ARTHROPLASTY     RIGHT     A IV Location/Drains/Wounds Patient Lines/Drains/Airways Status     Active Line/Drains/Airways     Name Placement date Placement time Site Days   Peripheral IV 12/03/22 22 G 1" Posterior;Right Hand 12/03/22  1359  Hand  1   Closed System Drain Rectal 12/03/22  2100  Rectal  1   External Urinary Catheter 12/03/22  2100  --  1            Intake/Output Last 24 hours  Intake/Output Summary (Last 24 hours) at 12/04/2022 0809 Last data filed at 12/04/2022 0630 Gross per 24 hour  Intake 100 ml  Output 1050 ml  Net -950 ml    Labs/Imaging Results for orders placed or performed during the hospital encounter of 12/03/22 (from the past 48 hour(s))  CBC with Differential     Status: Abnormal   Collection Time: 12/03/22  1:46 PM  Result Value Ref Range   WBC 11.6 (H) 4.0 - 10.5 K/uL   RBC 4.31 3.87 - 5.11 MIL/uL   Hemoglobin 10.6 (L) 12.0 - 15.0 g/dL   HCT 62.9 (L) 52.8 - 41.3 %   MCV 81.0 80.0 - 100.0 fL   MCH 24.6 (L) 26.0 - 34.0 pg   MCHC 30.4 30.0 - 36.0 g/dL   RDW 24.4 (H) 01.0 - 27.2 %   Platelets 210 150 - 400 K/uL   nRBC 0.0 0.0 - 0.2 %   Neutrophils Relative % 64 %   Neutro Abs 7.5 1.7 - 7.7 K/uL   Lymphocytes Relative 24 %   Lymphs Abs 2.8 0.7 - 4.0 K/uL   Monocytes Relative 9 %   Monocytes Absolute 1.1 (H) 0.1 - 1.0 K/uL   Eosinophils Relative 2 %   Eosinophils Absolute 0.3 0.0 - 0.5 K/uL   Basophils Relative 0 %   Basophils Absolute 0.0 0.0 - 0.1 K/uL   Immature Granulocytes 1 %   Abs Immature  Granulocytes 0.06 0.00 - 0.07 K/uL    Comment: Performed at [facility], [address] [place], [place] [address]  Comprehensive metabolic panel     Status: Abnormal   Collection Time: 12/03/22  1:46 PM  Result Value Ref Range   Sodium 138 135 - 145 mmol/L   Potassium 2.9 (L) 3.5 - 5.1 mmol/L   Chloride 105 98 - 111 mmol/L   CO2 25 22 - 32 mmol/L   Glucose, Bld 98 70 - 99 mg/dL    Comment: Glucose reference range applies only to samples taken after fasting for at least 8 hours.   BUN 10 8 - 23 mg/dL   Creatinine, Ser 4.03 0.44 - 1.00 mg/dL   Calcium 8.3 (L) 8.9 - 10.3 mg/dL   Total Protein 6.4 (L) 6.5 - 8.1 g/dL   Albumin 3.0 (L) 3.5 - 5.0 g/dL   AST 12 (L) 15 - 41 U/L   ALT 14 0 - 44 U/L   Alkaline Phosphatase 99 38 - 126 U/L   Total Bilirubin 0.3 0.3 - 1.2 mg/dL   GFR, Estimated >47 >42 mL/min    Comment: (NOTE) Calculated using the CKD-EPI Creatinine Equation (2021)    Anion gap 8 5 - 15    Comment: Performed at [facility], [address], [place], [place] [address]  Magnesium     Status: None   Collection Time: 12/03/22  1:46 PM  Result Value Ref Range   Magnesium 1.9 1.7 - 2.4 mg/dL    Comment: Performed at [facility], [address] [place], [place] [address]  Iron and TIBC     Status: Abnormal   Collection Time: 12/03/22  1:46 PM  Result Value Ref Range   Iron 14 (L) 28 - 170 ug/dL   TIBC 332 951 - 884 ug/dL   Saturation Ratios 4 (L) 10.4 - 31.8 %   UIBC 307 ug/dL    Comment: Performed at [facility], [address], [place], [place] [address]  Ferritin     Status: None   Collection Time: 12/03/22  1:46 PM  Result Value Ref Range   Ferritin 41 11 - 307 ng/mL    Comment: Performed at [facility], [address], [place], [place] [address]  Urinalysis, w/ Reflex to Culture (Infection Suspected) -Urine, Clean Catch     Status: Abnormal   Collection Time: 12/03/22  5:04 PM  Result Value Ref Range   Specimen Source  URINE, CLEAN CATCH    Color, Urine YELLOW  YELLOW   APPearance CLEAR CLEAR   Specific Gravity, Urine >1.046 (H) 1.005 - 1.030   pH 5.0 5.0 - 8.0   Glucose, UA NEGATIVE NEGATIVE mg/dL   Hgb urine dipstick NEGATIVE NEGATIVE   Bilirubin Urine NEGATIVE NEGATIVE   Ketones, ur NEGATIVE NEGATIVE mg/dL   Protein, ur NEGATIVE NEGATIVE mg/dL   Nitrite POSITIVE (A) NEGATIVE   Leukocytes,Ua SMALL (A) NEGATIVE   RBC / HPF 0-5 0 - 5 RBC/hpf   WBC, UA 11-20 0 - 5 WBC/hpf    Comment:        Reflex urine culture not performed if WBC <=10, OR if Squamous epithelial cells >5. If Squamous epithelial cells >5 suggest recollection.    Bacteria, UA RARE (A) NONE SEEN   Squamous Epithelial / HPF 0-5 0 - 5 /HPF   WBC Clumps PRESENT     Comment: Performed at [facility], [address], [place], [place] [address]  C Difficile Quick Screen w PCR reflex     Status: Abnormal   Collection Time: 12/03/22  5:04 PM   Specimen: In/Out Cath Urine; Stool  Result Value Ref Range   C Diff antigen (A) NEGATIVE    INVALID, POSSIBLE SAMPLE INTEGRITY COMPROMISED, TEST WAS REPEATED    Comment: RESULT CALLED TO, READ BACK BY AND VERIFIED WITH: A.FLETCHER AT 1845 ON 07.30.24 BY ADGER J     C Diff toxin (A) NEGATIVE    INVALID, POSSIBLE SAMPLE INTEGRITY COMPROMISED, TEST WAS REPEATED    Comment: RESULT CALLED TO, READ BACK BY AND VERIFIED WITH: A. FLETCHER AT 1845 ON 07.30.24 BY ADGER J     C Diff interpretation      INVALID, POSSIBLE SAMPLE INTEGRITY COMPROMISED, TEST WAS REPEATED    Comment: RESULT CALLED TO, READ BACK BY AND VERIFIED WITH: A.FLETCHER AT 1845 ON 07.30.24 BY ADGER J  Performed at [facility], [address] [place], [place] [address]   C Difficile Quick Screen w PCR reflex     Status: None   Collection Time: 12/03/22  5:54 PM   Specimen: STOOL  Result Value Ref Range   C Diff antigen NEGATIVE NEGATIVE   C Diff toxin NEGATIVE NEGATIVE   C Diff interpretation No C. difficile detected.     Comment: Performed at [facility], [address], [place], [place] [address]  Basic metabolic panel     Status: Abnormal   Collection Time: 12/04/22  3:36 AM  Result Value Ref Range   Sodium 140 135 - 145 mmol/L   Potassium 3.1 (L) 3.5 - 5.1 mmol/L   Chloride 107 98 - 111 mmol/L   CO2 25 22 - 32 mmol/L   Glucose, Bld 103 (H) 70 - 99 mg/dL    Comment: Glucose reference range applies only to samples taken after fasting for at least 8 hours.   BUN 7 (L) 8 - 23 mg/dL   Creatinine, Ser 5.28 0.44 - 1.00 mg/dL   Calcium 8.2 (L) 8.9 - 10.3 mg/dL   GFR, Estimated >41 >32 mL/min    Comment: (NOTE) Calculated using the CKD-EPI Creatinine Equation (2021)    Anion gap 8 5 - 15    Comment: Performed at [facility], [address] [place], [place] [address]  CBC     Status: Abnormal   Collection Time: 12/04/22  3:36 AM  Result Value Ref Range   WBC 9.9 4.0 - 10.5 K/uL   RBC 3.83 (L)  3.87 - 5.11 MIL/uL   Hemoglobin 9.7 (L) 12.0 - 15.0 g/dL   HCT 82.9 (L) 56.2 - 13.0 %   MCV 81.5 80.0 - 100.0 fL   MCH 25.3 (L) 26.0 - 34.0 pg   MCHC 31.1 30.0 - 36.0 g/dL   RDW 86.5 (H) 78.4 - 69.6 %   Platelets 224 150 - 400 K/uL   nRBC 0.0 0.0 - 0.2 %    Comment: Performed at [facility], [address] [place], [place] [address]   CT ABDOMEN PELVIS W CONTRAST  Result Date: 12/03/2022 CLINICAL DATA:  Left lower quadrant pain, nausea, and diarrhea for 3 days. EXAM: CT ABDOMEN AND PELVIS WITH CONTRAST TECHNIQUE: Multidetector CT imaging of the abdomen and pelvis was performed using the standard protocol following bolus administration of intravenous contrast. RADIATION DOSE REDUCTION: This exam was performed according to the departmental dose-optimization program which includes automated exposure control, adjustment of the mA and/or kV according to patient size and/or use of iterative reconstruction technique. CONTRAST:  OMNIPAQUE IOHEXOL 300 MG/ML  SOLN COMPARISON:  07/13/2019 FINDINGS: Lower Chest: No acute findings. Hepatobiliary: No suspicious  hepatic masses identified. Prior cholecystectomy. No evidence of biliary obstruction. Pancreas:  No mass or inflammatory changes. Spleen: Within normal limits in size and appearance. Adrenals/Urinary Tract: No suspicious masses identified. 2 mm right renal calculus noted. No evidence of ureteral calculi or hydronephrosis. Stomach/Bowel: Long segment colonic wall thickening is seen involving the majority of the descending and sigmoid colon, with mild pericolonic soft tissue stranding. This is consistent with moderate colitis. No No evidence of abscess or bowel obstruction. Normal appendix visualized. Vascular/Lymphatic: No pathologically enlarged lymph nodes. No acute vascular findings. Reproductive: Prior hysterectomy noted. Adnexal regions are unremarkable in appearance. Other:  None. Musculoskeletal:  No suspicious bone lesions identified. IMPRESSION: Moderate colitis involving the descending and sigmoid colon. No evidence of abscess or bowel obstruction. Tiny nonobstructing right renal calculus. No evidence of ureteral calculi or hydronephrosis. Electronically Signed   By: Danae Orleans M.D.   On: 12/03/2022 16:08    Pending Labs Unresulted Labs (From admission, onward)     Start     Ordered   12/03/22 1704  Urine Culture  Once,   R        12/03/22 1704   12/03/22 1320  Gastrointestinal Panel by PCR , Stool  (Gastrointestinal Panel by PCR, Stool                                                                                                                                                     **Does Not include CLOSTRIDIUM DIFFICILE testing. **If CDIFF testing is needed, place order from the "C Difficile Testing" order set.**)  Once,   URGENT        12/03/22 1319   Pending  SARS Coronavirus 2 by RT  PCR (hospital order, performed in [facility] hospital lab) *cepheid single result test* Anterior Nasal Swab  (Tier 2 - SARS Coronavirus 2 by RT PCR (hospital order, performed in [facility] hospital lab)  *cepheid single result test*)  Once,   R        Pending            Vitals/Pain Today's Vitals   12/04/22 0500 12/04/22 0600 12/04/22 0630 12/04/22 0800  BP: (!) 115/50 106/89  (!) 117/46  Pulse: 72 71  72  Resp: 20 20  (!) 23  Temp:  98.1 F (36.7 C)    TempSrc:  Oral    SpO2: 95% 94%  93%  Weight:      Height:      PainSc:   Asleep     Isolation Precautions Enteric precautions (UV disinfection)  Medications Medications  enoxaparin (LOVENOX) injection 40 mg (has no administration in time range)  0.9 %  sodium chloride infusion ( Intravenous Rate/Dose Verify 12/04/22 0808)  acetaminophen (TYLENOL) tablet 650 mg (650 mg Oral Given 12/03/22 2325)    Or  acetaminophen (TYLENOL) suppository 650 mg ( Rectal See Alternative 12/03/22 2325)  ondansetron (ZOFRAN) tablet 4 mg (has no administration in time range)    Or  ondansetron (ZOFRAN) injection 4 mg (has no administration in time range)  gabapentin (NEURONTIN) capsule 300 mg (300 mg Oral Given 12/03/22 2325)  DULoxetine (CYMBALTA) DR capsule 60 mg (has no administration in time range)  pravastatin (PRAVACHOL) tablet 80 mg (has no administration in time range)  losartan (COZAAR) tablet 100 mg (has no administration in time range)  amLODipine (NORVASC) tablet 5 mg (5 mg Oral Given 12/03/22 2325)  fesoterodine (TOVIAZ) tablet 4 mg (has no administration in time range)  buPROPion (WELLBUTRIN XL) 24 hr tablet 150 mg (150 mg Oral Given 12/03/22 2325)  sodium chloride 0.9 % bolus 500 mL (0 mLs Intravenous Stopped 12/03/22 1530)  ondansetron (ZOFRAN) injection 4 mg (4 mg Intravenous Given 12/03/22 1402)  iohexol (OMNIPAQUE) 300 MG/ML solution 100 mL (100 mLs Intravenous Contrast Given 12/03/22 1459)  potassium chloride 10 mEq in 100 mL IVPB (0 mEq Intravenous Stopped 12/03/22 2049)  potassium chloride SA (KLOR-CON M) CR tablet 40 mEq (40 mEq Oral Given 12/03/22 1634)  cefTRIAXone (ROCEPHIN) 1 g in sodium chloride 0.9 % 100 mL IVPB (0 g  Intravenous Stopped 12/03/22 2140)    Mobility walks with device     Focused Assessments Head to Toe Assessment documented at 0015 by Forest Gleason, night shift ED RN   R Recommendations: See Admitting Provider Note  Report given to:   Additional Notes: Pt has boot for right foot since surgery on 5/6. Daughter reports she was originally supposed to be non-weight bearing but with the boot she is able to tolerate weight and ambulate with the walker.

## 2022-12-04 NOTE — Evaluation (Signed)
Occupational Therapy Evaluation Patient Details Name: Tiffany Brown MRN: [id number] DOB: [birth year] Today's Date: 12/04/2022   History of Present Illness Tiffany Brown is a 77 y.o. female with medical history significant of depression and anxiety, HTN, GERD, fibromyalgia, melanoma, HLD, prediabetes who presented to Ed with complaints of nausea and diarrhea x 3 days.  She denies any blood in her stool and states just copious brown water. She states it is foul smelling. She denies eating any raw or undercooked foods or left overs. No travel.  She has intermittent lower abdomen pain. She states the pain is better after a BM. Pain rated as an 8/10 and is sharp in nature. No radiation. Eating or drinking seems to make the pain worse. She has been unable to get to the bathroom at home and can not get to the bathroom quick enough. She is pretty much bed bound. She was non weight bearing x 6-8 weeks. After this she was only supposed to have light touch and go. (per MD)   Clinical Impression   Pt was agreeable to OT and PT co-evaluation. Pt lives with husband who is struggling to give pt the level of support needed at home, per pt's report. Pt has been staying in the lift chair with most mobility coming when standing for a diaper change. Today pt required mod to max A for step pivot to the chair with RW. Pt requires assist for bathing, dressing, and toileting. Pt's L UE is limited some at baseline and pt is generally weak overall. Pt was left in the chair with call bell within reach. Supplemental O2 removed during session due to pt's O2 saturation being in the mid 90s without it. Pt will benefit from continued OT in the hospital and recommended venue below to increase strength, balance, and endurance for safe ADL's.         Recommendations for follow up therapy are one component of a multi-disciplinary discharge planning process, led by the attending physician.  Recommendations may be updated based on  patient status, additional functional criteria and insurance authorization.   Assistance Recommended at Discharge Intermittent Supervision/Assistance  Patient can return home with the following A lot of help with walking and/or transfers;A lot of help with bathing/dressing/bathroom;Assistance with cooking/housework;Assist for transportation;Help with stairs or ramp for entrance    Functional Status Assessment  Patient has had a recent decline in their functional status and demonstrates the ability to make significant improvements in function in a reasonable and predictable amount of time.  Equipment Recommendations  None recommended by OT    Recommendations for Other Services       Precautions / Restrictions Precautions Precautions: Fall Precaution Comments: R walking boot. Required Braces or Orthoses: Other Brace Other Brace: R boot Restrictions Weight Bearing Restrictions: Yes RLE Weight Bearing: Touchdown weight bearing      Mobility Bed Mobility Overal bed mobility: Needs Assistance Bed Mobility: Supine to Sit     Supine to sit: Min assist, Mod assist     General bed mobility comments: slow labored effort; use of bed rails    Transfers Overall transfer level: Needs assistance Equipment used: Rolling walker (2 wheels) Transfers: Sit to/from Stand, Bed to chair/wheelchair/BSC Sit to Stand: Mod assist, Max assist     Step pivot transfers: Mod assist     General transfer comment: Verbal cuing for to maintain weight bearing status with R LE; labored effort. Unsteady in standing with increased pain.      Balance Overall balance  assessment: Needs assistance Sitting-balance support: No upper extremity supported, Feet supported Sitting balance-Leahy Scale: Good Sitting balance - Comments: seated EOB   Standing balance support: Bilateral upper extremity supported, During functional activity, Reliant on assistive device for balance Standing balance-Leahy Scale:  Poor Standing balance comment: using RW                           ADL either performed or assessed with clinical judgement   ADL Overall ADL's : Needs assistance/impaired     Grooming: Set up;Sitting   Upper Body Bathing: Minimal assistance;Sitting   Lower Body Bathing: Maximal assistance;Sitting/lateral leans   Upper Body Dressing : Minimal assistance;Sitting   Lower Body Dressing: Maximal assistance;Sitting/lateral leans Lower Body Dressing Details (indicate cue type and reason): Assisted to don socks boot and shoe at EOB. Toilet Transfer: Moderate assistance;Stand-pivot;Rolling walker (2 wheels);Maximal assistance Toilet Transfer Details (indicate cue type and reason): simulated via EOB to chair transfer with RW Toileting- Clothing Manipulation and Hygiene: Total assistance;Maximal assistance;Bed level       Functional mobility during ADLs: Moderate assistance;Maximal assistance;Rolling walker (2 wheels)       Vision Baseline Vision/History: 0 No visual deficits Ability to See in Adequate Light: 0 Adequate Patient Visual Report: No change from baseline Vision Assessment?: No apparent visual deficits                Pertinent Vitals/Pain Pain Assessment Pain Assessment: 0-10 Pain Score: 3  Pain Location: low back; reported pain in B LE during transfer. Pain Descriptors / Indicators: Dull, Aching Pain Intervention(s): Limited activity within patient's tolerance, Monitored during session, Repositioned     Hand Dominance Right   Extremity/Trunk Assessment Upper Extremity Assessment Upper Extremity Assessment: LUE deficits/detail;RUE deficits/detail RUE Deficits / Details: Generally weak. LUE Deficits / Details: Baseline deficits. Limited to ~75% available A/ROM of shoulder flexion. Limited by at least 15% of available range in elbow flexion.   Lower Extremity Assessment Lower Extremity Assessment: Defer to PT evaluation   Cervical / Trunk  Assessment Cervical / Trunk Assessment: Kyphotic   Communication Communication Communication: No difficulties   Cognition Arousal/Alertness: Awake/alert Behavior During Therapy: WFL for tasks assessed/performed Overall Cognitive Status: Within Functional Limits for tasks assessed                                                        Home Living Family/patient expects to be discharged to:: Private residence Living Arrangements: Spouse/significant other Available Help at Discharge: Family;Available PRN/intermittently Type of Home: House Home Access: Stairs to enter [employer] of Steps: 3 Entrance Stairs-Rails: Left Home Layout: One level     Bathroom Shower/Tub: Producer, television/film/video: Handicapped height Bathroom Accessibility: Yes   Home Equipment: Agricultural consultant (2 wheels);Rollator (4 wheels);Shower seat;Wheelchair - manual;Grab bars - tub/shower;Other (comment) (stand up rollator; lift chair)   Additional Comments: Husband struggles to physically assist pt.      Prior Functioning/Environment Prior Level of Function : Needs assist       Physical Assist : Mobility (physical);ADLs (physical) Mobility (physical): Transfers;Gait;Bed mobility;Stairs ADLs (physical): Bathing;Dressing;Toileting;IADLs Mobility Comments: Pt has been staying in the lift chair with assist to stand for diaper changes. ADLs Comments: Assisted for bathing, dressing, and toileting by husband. Able to groom and feed.  OT Problem List: Decreased strength;Decreased range of motion;Decreased activity tolerance;Impaired balance (sitting and/or standing);Obesity;Pain      OT Treatment/Interventions: Self-care/ADL training;Therapeutic exercise;Therapeutic activities;Patient/family education;Balance training    OT Goals(Current goals can be found in the care plan section) Acute Rehab OT Goals Patient Stated Goal: return home OT Goal Formulation: With  patient Time For Goal Achievement: 12/18/22 Potential to Achieve Goals: Good  OT Frequency: Min 2X/week    Co-evaluation PT/OT/SLP Co-Evaluation/Treatment: Yes Reason for Co-Treatment: To address functional/ADL transfers   OT goals addressed during session: ADL's and self-care      AM-PAC OT "6 Clicks" Daily Activity     Outcome Measure Help from another person eating meals?: None Help from another person taking care of personal grooming?: A Little Help from another person toileting, which includes using toliet, bedpan, or urinal?: A Lot Help from another person bathing (including washing, rinsing, drying)?: A Lot Help from another person to put on and taking off regular upper body clothing?: A Little Help from another person to put on and taking off regular lower body clothing?: A Lot 6 Click Score: 16   End of Session Equipment Utilized During Treatment: Rolling walker (2 wheels);Gait belt Nurse Communication: Mobility status  Activity Tolerance: Patient tolerated treatment well Patient left: in chair;with call bell/phone within reach  OT Visit Diagnosis: Unsteadiness on feet (R26.81);Other abnormalities of gait and mobility (R26.89);Muscle weakness (generalized) (M62.81)                Time: 5784-6962 OT Time Calculation (min): 23 min Charges:  OT General Charges $OT Visit: 1 Visit OT Evaluation $OT Eval Low Complexity: 1 Low  Esteven Overfelt OT, MOT   Danie Chandler 12/04/2022, 10:13 AM

## 2022-12-04 NOTE — Plan of Care (Signed)
  Problem: Acute Rehab PT Goals(only PT should resolve) Goal: Pt Will Go Supine/Side To Sit Outcome: Progressing Flowsheets (Taken 12/04/2022 1215) Pt will go Supine/Side to Sit:  with min guard assist  with minimal assist Goal: Patient Will Transfer Sit To/From Stand Outcome: Progressing Flowsheets (Taken 12/04/2022 1215) Patient will transfer sit to/from stand:  with minimal assist  with moderate assist Goal: Pt Will Transfer Bed To Chair/Chair To Bed Outcome: Progressing Flowsheets (Taken 12/04/2022 1215) Pt will Transfer Bed to Chair/Chair to Bed:  with min assist  with mod assist Goal: Pt Will Ambulate Outcome: Progressing Flowsheets (Taken 12/04/2022 1215) Pt will Ambulate:  10 feet  with moderate assist  with rolling walker   12:16 PM, 12/04/22 Ocie Bob, MPT Physical Therapist with [facility] 336 [phone] office [phone] mobile phone

## 2022-12-04 NOTE — TOC Initial Note (Signed)
Transition of Care [facility]) - Initial/Assessment Note    Patient Details  Name: Tiffany Brown MRN: [id number] Date of Birth: January 24, 1946  Transition of Care [facility]) CM/SW Contact:    Villa Herb, LCSWA Phone Number: 12/04/2022, 10:40 AM  Clinical Narrative:                 [facility] consulted for SNF placement. PT recommending SNF for pt at D/C. CSW spoke with pts spouse who is at bedside about SNF recommendation. Pt and spouse are agreeable to SNF referral being sent out to facilities for review. CSW to complete referral and send out for review. TOC to follow.   Expected Discharge Plan: Skilled Nursing Facility Barriers to Discharge: Continued Medical Work up   Patient Goals and CMS Choice Patient states their goals for this hospitalization and ongoing recovery are:: return home CMS Medicare.gov Compare Post Acute Care list provided to:: Patient Choice offered to / list presented to : Patient, Spouse [employer] ownership interest in [facility].provided to:: Patient    Expected Discharge Plan and Services In-house Referral: Clinical Social Work Discharge Planning Services: CM Consult Post Acute Care Choice: Skilled Nursing Facility Living arrangements for the past 2 months: Single Family Home                                      Prior Living Arrangements/Services Living arrangements for the past 2 months: Single Family Home Lives with:: Spouse Patient language and need for interpreter reviewed:: Yes Do you feel safe going back to the place where you live?: Yes      Need for Family Participation in Patient Care: Yes (Comment) Care giver support system in place?: Yes (comment)   Criminal Activity/Legal Involvement Pertinent to Current Situation/Hospitalization: No - Comment as needed  Activities of Daily Living Home Assistive Devices/Equipment: Bedside commode/3-in-1, Brace (specify type), Shower chair without back, Grab bars in shower (cam walker) ADL  Screening (condition at time of admission) Patient's cognitive ability adequate to safely complete daily activities?: Yes Is the patient deaf or have difficulty hearing?: No Does the patient have difficulty seeing, even when wearing glasses/contacts?: No Does the patient have difficulty concentrating, remembering, or making decisions?: No Patient able to express need for assistance with ADLs?: Yes Does the patient have difficulty dressing or bathing?: Yes Independently performs ADLs?: No Communication: Independent Dressing (OT): Needs assistance (assisted by husband) Is this a change from baseline?: Pre-admission baseline Grooming: Independent Feeding: Independent Bathing: Needs assistance Is this a change from baseline?: Pre-admission baseline Toileting: Needs assistance (bedside commode at home) Is this a change from baseline?: Pre-admission baseline In/Out Bed: Needs assistance Is this a change from baseline?: Pre-admission baseline Walks in Home: Dependent (pt states that she does not walk at home) Is this a change from baseline?: Pre-admission baseline Does the patient have difficulty walking or climbing stairs?: Yes Weakness of Legs: None Weakness of Arms/Hands: None  Permission Sought/Granted                  Emotional Assessment Appearance:: Appears stated age Attitude/Demeanor/Rapport: Engaged Affect (typically observed): Accepting Orientation: : Oriented to Self, Oriented to Place, Oriented to  Time, Oriented to Situation Alcohol / Substance Use: Not Applicable Psych Involvement: No (comment)  Admission diagnosis:  Hypokalemia [E87.6] Colitis [K52.9] Patient Active Problem List   Diagnosis Date Noted   Colitis 12/03/2022   Hypokalemia 12/03/2022   Normocytic  anemia 12/03/2022   Ambulatory dysfunction 12/03/2022   Cystitis 12/03/2022   Right foot pain 09/10/2022   Stasis dermatitis of both legs 12/24/2018   Degenerative disc disease, lumbar 12/24/2018    Fibromyalgia 12/24/2018   Vitamin D deficiency 12/24/2018   Class 3 severe obesity with serious comorbidity and body mass index (BMI) of 40.0 to 44.9 in adult (HCC) 07/23/2017   Closed fracture of right inferior pubic ramus (HCC) 11/18/2016   Recurrent major depressive disorder, in full remission (HCC) 06/06/2016   DM type 2 with diabetic dyslipidemia (HCC) 03/06/2016   Edema 09/14/2013   Precordial pain 08/20/2011   Essential hypertension, benign    Hyperlipidemia    PCP:  Dettinger, Elige Radon, MD Pharmacy:   [phone] - [place] - [address] [place] [address] Phone: [phone] Fax: [phone]  [id number] - [place], [place] - [address] [place] [address] Phone: [phone] Fax: [phone]     Social Determinants of Health (SDOH) Social History: SDOH Screenings   Food Insecurity: No Food Insecurity (12/04/2022)  Housing: Low Risk  (12/04/2022)  Transportation Needs: No Transportation Needs (12/04/2022)  Utilities: Not At Risk (12/04/2022)  Alcohol Screen: Low Risk  (09/26/2022)  Depression (PHQ2-9): Low Risk  (09/26/2022)  Recent Concern: Depression (PHQ2-9) - High Risk (08/16/2022)  Financial Resource Strain: Low Risk  (09/26/2022)  Physical Activity: Insufficiently Active (09/26/2022)  Social Connections: Moderately Isolated (09/26/2022)  Stress: No Stress Concern Present (09/26/2022)  Tobacco Use: Medium Risk (12/03/2022)   SDOH Interventions:     Readmission Risk Interventions     No data to display

## 2022-12-04 NOTE — Evaluation (Signed)
Physical Therapy Evaluation Patient Details Name: Tiffany Brown MRN: [id number] DOB: 02-Dec-1945 Today's Date: 12/04/2022  History of Present Illness  Tiffany Brown is a 77 y.o. female with medical history significant of depression and anxiety, HTN, GERD, fibromyalgia, melanoma, HLD, prediabetes who presented to Ed with complaints of nausea and diarrhea x 3 days.  She denies any blood in her stool and states just copious brown water. She states it is foul smelling. She denies eating any raw or undercooked foods or left overs. No travel.  She has intermittent lower abdomen pain. She states the pain is better after a BM. Pain rated as an 8/10 and is sharp in nature. No radiation. Eating or drinking seems to make the pain worse. She has been unable to get to the bathroom at home and can not get to the bathroom quick enough. She is pretty much bed bound. She was non weight bearing x 6-8 weeks. After this she was only supposed to have light touch and go.   Clinical Impression  Patient demonstrates slow labored movement for sitting up at bedside with difficulty moving RLE due to increased pain, required assistance for donning RLE walking boot, very unsteady on feet and limited to a few side steps with fair/poor carryover for maintaining TTWB on RLE due to weakness and tolerated sitting up in chair after therapy.  Patient will benefit from continued skilled physical therapy in hospital and recommended venue below to increase strength, balance, endurance for safe ADLs and gait.          If plan is discharge home, recommend the following: A lot of help with bathing/dressing/bathroom;A lot of help with walking and/or transfers;Help with stairs or ramp for entrance;Assistance with cooking/housework   Can travel by private vehicle   No    Equipment Recommendations None recommended by PT  Recommendations for Other Services       Functional Status Assessment Patient has had a recent decline in their  functional status and demonstrates the ability to make significant improvements in function in a reasonable and predictable amount of time.     Precautions / Restrictions Precautions Precautions: Fall Precaution Comments: R walking boot. Required Braces or Orthoses: Other Brace Other Brace: R boot Restrictions Weight Bearing Restrictions: Yes RLE Weight Bearing: Touchdown weight bearing      Mobility  Bed Mobility Overal bed mobility: Needs Assistance Bed Mobility: Supine to Sit     Supine to sit: Min assist, Mod assist, HOB elevated     General bed mobility comments: increased time, labored movement, had to use bed rails and HOB raised    Transfers Overall transfer level: Needs assistance Equipment used: Rolling walker (2 wheels) Transfers: Sit to/from Stand, Bed to chair/wheelchair/BSC Sit to Stand: Mod assist, Max assist   Step pivot transfers: Mod assist       General transfer comment: unsteady labored movement with fair/poor carryover for TTWB wearing walking boot on RLE    Ambulation/Gait Ambulation/Gait assistance: Mod assist, Max assist Gait Distance (Feet): 4 Feet Assistive device: Rolling walker (2 wheels) Gait Pattern/deviations: Decreased step length - right, Decreased step length - left, Decreased stride length, Antalgic, Trunk flexed, Shuffle Gait velocity: slow     General Gait Details: limited to a few slow labored unsteady side steps with mostly shuffling of RLE due to weakness  Stairs            Wheelchair Mobility     Tilt Bed    Modified Rankin (Stroke Patients Only)  Balance Overall balance assessment: Needs assistance Sitting-balance support: Feet supported, No upper extremity supported Sitting balance-Leahy Scale: Good Sitting balance - Comments: seated EOB   Standing balance support: Bilateral upper extremity supported, During functional activity, Reliant on assistive device for balance Standing balance-Leahy Scale:  Poor Standing balance comment: using RW                             Pertinent Vitals/Pain Pain Assessment Pain Assessment: 0-10 Pain Score: 3  Pain Location: low back; reported pain in B LE during transfer. Pain Descriptors / Indicators: Dull, Aching Pain Intervention(s): Limited activity within patient's tolerance, Monitored during session, Repositioned    Home Living Family/patient expects to be discharged to:: Private residence Living Arrangements: Spouse/significant other Available Help at Discharge: Family;Available PRN/intermittently Type of Home: House Home Access: Stairs to enter Entrance Stairs-Rails: Left Entrance Stairs-Number of Steps: 3   Home Layout: One level Home Equipment: Agricultural consultant (2 wheels);Rollator (4 wheels);Shower seat;Wheelchair - manual;Grab bars - tub/shower;Other (comment) Additional Comments: Husband struggles to physically assist pt.    Prior Function Prior Level of Function : Needs assist       Physical Assist : Mobility (physical);ADLs (physical) Mobility (physical): Transfers;Gait;Bed mobility;Stairs ADLs (physical): Bathing;Dressing;Toileting;IADLs Mobility Comments: Pt has been staying in the lift chair with assist to stand for diaper changes, prior to right foot surgery was walking ADLs Comments: Assisted for bathing, dressing, and toileting by husband. Able to groom and feed.     Hand Dominance   Dominant Hand: Right    Extremity/Trunk Assessment   Upper Extremity Assessment Upper Extremity Assessment: Defer to OT evaluation RUE Deficits / Details: Generally weak. LUE Deficits / Details: Baseline deficits. Limited to ~75% available A/ROM of shoulder flexion. Limited by at least 15% of available range in elbow flexion.    Lower Extremity Assessment Lower Extremity Assessment: Generalized weakness;RLE deficits/detail RLE Deficits / Details: grossly -3/5 except right foot not tested RLE: Unable to fully assess due to  pain RLE Sensation: WNL RLE Coordination: WNL    Cervical / Trunk Assessment Cervical / Trunk Assessment: Kyphotic  Communication   Communication: No difficulties  Cognition Arousal/Alertness: Awake/alert Behavior During Therapy: WFL for tasks assessed/performed Overall Cognitive Status: Within Functional Limits for tasks assessed                                          General Comments      Exercises     Assessment/Plan    PT Assessment Patient needs continued PT services  PT Problem List Decreased strength;Decreased activity tolerance;Decreased balance;Decreased mobility       PT Treatment Interventions DME instruction;Gait training;Functional mobility training;Therapeutic activities;Therapeutic exercise;Patient/family education;Balance training;Wheelchair mobility training    PT Goals (Current goals can be found in the Care Plan section)  Acute Rehab PT Goals Patient Stated Goal: return home with family to assist after reahb PT Goal Formulation: With patient Time For Goal Achievement: 12/18/22 Potential to Achieve Goals: Good    Frequency Min 3X/week     Co-evaluation PT/OT/SLP Co-Evaluation/Treatment: Yes Reason for Co-Treatment: To address functional/ADL transfers PT goals addressed during session: Mobility/safety with mobility;Balance;Proper use of DME OT goals addressed during session: ADL's and self-care       AM-PAC PT "6 Clicks" Mobility  Outcome Measure Help needed turning from your back to your side while in a  flat bed without using bedrails?: A Little Help needed moving from lying on your back to sitting on the side of a flat bed without using bedrails?: A Little Help needed moving to and from a bed to a chair (including a wheelchair)?: A Lot Help needed standing up from a chair using your arms (e.g., wheelchair or bedside chair)?: A Lot Help needed to walk in hospital room?: A Lot Help needed climbing 3-5 steps with a railing? :  Total 6 Click Score: 13    End of Session   Activity Tolerance: Patient tolerated treatment well;Patient limited by fatigue;Patient limited by pain Patient left: in chair;with call bell/phone within reach Nurse Communication: Mobility status PT Visit Diagnosis: Unsteadiness on feet (R26.81);Other abnormalities of gait and mobility (R26.89);Muscle weakness (generalized) (M62.81)    Time: 1610-9604 PT Time Calculation (min) (ACUTE ONLY): 30 min   Charges:   PT Evaluation $PT Eval Moderate Complexity: 1 Mod PT Treatments $Therapeutic Activity: 23-37 mins PT General Charges $$ ACUTE PT VISIT: 1 Visit         12:12 PM, 12/04/22 Ocie Bob, MPT Physical Therapist with [facility] [phone] office [phone] mobile phone

## 2022-12-04 NOTE — [place] FL2 (Addendum)
[place] MEDICAID FL2 LEVEL OF CARE FORM     IDENTIFICATION  Patient Name: Tiffany Brown Birthdate: December 03, 1945 Sex: female Admission Date (Current Location): 12/03/2022  [facility] and [place] Number:  [employer] and Address:  [facility],  [address] [place] [address]      Provider Number: [id number]  Attending Physician Name and Address:  Hazeline Junker, MD  Relative Name and Phone Number:       Current Level of Care: Hospital Recommended Level of Care: Skilled Nursing Facility Prior Approval Number:    Date Approved/Denied:   PASRR Number:    Discharge Plan: SNF    Current Diagnoses: Patient Active Problem List   Diagnosis Date Noted   Colitis 12/03/2022   Hypokalemia 12/03/2022   Normocytic anemia 12/03/2022   Ambulatory dysfunction 12/03/2022   Cystitis 12/03/2022   Right foot pain 09/10/2022   Stasis dermatitis of both legs 12/24/2018   Degenerative disc disease, lumbar 12/24/2018   Fibromyalgia 12/24/2018   Vitamin D deficiency 12/24/2018   Class 3 severe obesity with serious comorbidity and body mass index (BMI) of 40.0 to 44.9 in adult [facility]) 07/23/2017   Closed fracture of right inferior pubic ramus (HCC) 11/18/2016   Recurrent major depressive disorder, in full remission (HCC) 06/06/2016   DM type 2 with diabetic dyslipidemia (HCC) 03/06/2016   Edema 09/14/2013   Precordial pain 08/20/2011   Essential hypertension, benign    Hyperlipidemia     Orientation RESPIRATION BLADDER Height & Weight     Self, Time, Situation, Place  O2 (2L) Incontinent Weight: 283 lb (128.4 kg) Height:  5\' 5"  (165.1 cm)  BEHAVIORAL SYMPTOMS/MOOD NEUROLOGICAL BOWEL NUTRITION STATUS      Incontinent Diet (Heart healthy)  AMBULATORY STATUS COMMUNICATION OF NEEDS Skin   Extensive Assist Verbally Normal                       Personal Care Assistance Level of Assistance  Bathing, Feeding, Dressing Bathing Assistance: Maximum  assistance Feeding assistance: Independent Dressing Assistance: Maximum assistance     Functional Limitations Info  Sight, Hearing, Speech Sight Info: Adequate Hearing Info: Adequate Speech Info: Adequate    SPECIAL CARE FACTORS FREQUENCY  PT (By licensed PT), OT (By licensed OT)     PT Frequency: 5 times weekly OT Frequency: 5 times weekly            Contractures Contractures Info: Not present    Additional Factors Info  Code Status, Allergies Code Status Info: FULL Allergies Info: NKA           Current Medications (12/04/2022):  This is the current hospital active medication list Current Facility-Administered Medications  Medication Dose Route Frequency Provider Last Rate Last Admin   acetaminophen (TYLENOL) tablet 650 mg  650 mg Oral Q6H PRN Orland Mustard, MD   650 mg at 12/04/22 1031   Or   acetaminophen (TYLENOL) suppository 650 mg  650 mg Rectal Q6H PRN Orland Mustard, MD       amLODipine (NORVASC) tablet 5 mg  5 mg Oral QHS Orland Mustard, MD   5 mg at 12/03/22 2325   buPROPion (WELLBUTRIN XL) 24 hr tablet 150 mg  150 mg Oral QHS Orland Mustard, MD   150 mg at 12/03/22 2325   DULoxetine (CYMBALTA) DR capsule 60 mg  60 mg Oral BID Orland Mustard, MD   60 mg at 12/04/22 1031   enoxaparin (LOVENOX) injection 40 mg  40 mg  Subcutaneous Q24H Orland Mustard, MD   40 mg at 12/04/22 1031   fesoterodine (TOVIAZ) tablet 4 mg  4 mg Oral Daily Orland Mustard, MD       gabapentin (NEURONTIN) capsule 300 mg  300 mg Oral TID Orland Mustard, MD   300 mg at 12/04/22 1031   losartan (COZAAR) tablet 100 mg  100 mg Oral Daily Orland Mustard, MD   100 mg at 12/04/22 1031   ondansetron (ZOFRAN) tablet 4 mg  4 mg Oral Q6H PRN Orland Mustard, MD       Or   ondansetron [facility]) injection 4 mg  4 mg Intravenous Q6H PRN Orland Mustard, MD       [START ON 12/05/2022] pravastatin (PRAVACHOL) tablet 80 mg  80 mg Oral Daily Orson Aloe, [facility]         Discharge Medications: Please see  discharge summary for a list of discharge medications.  Relevant Imaging Results:  Relevant Lab Results:   Additional Information SSN: [id number], [place]

## 2022-12-05 DIAGNOSIS — K529 Noninfective gastroenteritis and colitis, unspecified: Secondary | ICD-10-CM | POA: Diagnosis not present

## 2022-12-05 MED ORDER — MUSCLE RUB 10-15 % EX CREA
TOPICAL_CREAM | CUTANEOUS | Status: DC | PRN
  Filled 2022-12-05: qty 85

## 2022-12-05 NOTE — Plan of Care (Signed)
  Problem: Education: Goal: Knowledge of General Education information will improve Description: Including pain rating scale, medication(s)/side effects and non-pharmacologic comfort measures Outcome: Progressing   Problem: Health Behavior/Discharge Planning: Goal: Ability to manage health-related needs will improve Outcome: Progressing   Problem: Coping: Goal: Level of anxiety will decrease Outcome: Progressing   Problem: Nutrition: Goal: Adequate nutrition will be maintained Outcome: Progressing   Problem: Activity: Goal: Risk for activity intolerance will decrease Outcome: Progressing   Problem: Elimination: Goal: Will not experience complications related to bowel motility Outcome: Progressing Goal: Will not experience complications related to urinary retention Outcome: Progressing

## 2022-12-05 NOTE — Progress Notes (Signed)
Rectal tube removed, pt tolerated well.

## 2022-12-05 NOTE — Progress Notes (Signed)
[facility] PROGRESS NOTE  Tiffany Brown (DOB: 1945-10-14) WUJ:[id number] PCP: Dettinger, Elige Radon, MD  Brief Narrative: Tiffany Brown is a 77 y.o. female with a history of depression and anxiety, HTN, GERD, fibromyalgia, melanoma, HLD, prediabetes who presented to the ED on 12/03/2022 with 3 days of nausea and watery diarrhea. She had been at rehab after foot surgery 5/7 - 7/22, recently discharged home, was treated for UTI approximately 3 weeks ago, and still noticed significant debility. In the ED she was afebrile with leukocytosis (WBC 11.6k), hypokalemic, nitrite-positive pyuria on UA, and CT abd/pelvis demonstrating moderate descending/sigmoid colitis. GI pathogen panel and C. diff assay collected and the patient was admitted. She has profuse watery diarrhea requiring rectal tube, though studies have been negative. Ceftriaxone and flagyl as well as IVF started.  Subjective: Wants rectal tube out, abd pain improved, feels less weak.  Objective: BP (!) 127/59   Pulse 73   Temp 98.6 F (37 C) (Oral)   Resp 20   Ht 5\' 5"  (1.651 m)   Wt 128.4 kg   SpO2 96%   BMI 47.09 kg/m   Gen: Frail pleasant pale female in no distress Pulm: Clear, nonlabored  CV: RRR, no MRG or edema GI: Soft, NT, ND, +BS. Limited by adiposity, perhaps some mild tenderness on left side. Neuro: Alert and oriented. No new focal deficits. Ext: Warm, no deformities. Skin: No acute rashes, lesions or ulcers on visualized skin   Assessment & Plan: Descending, sigmoid colitis: Negative GI panel and C. diff, though has recent healthcare exposure and abx. No acidosis, reassuring exam. No bleeding. - Continue ceftriaxone, flagyl for now. WBC normalized, Tmax 99.5F / 24hrs - Started imodium TID, abd exam remains benign. DC rectal tube.  E. coli UTI:  - Ceftriaxone already planned as above.   Hypokalemia: Primarily suspect GI losses, inadequate po replenishment  - Supplemented, will start continuous K supp  in maintenance IVF. Monitor in AM with Mg  Hypochromic anemia: With ferritin normal (41), iron (14) and % sat (4%) are low.  - Would likely benefit from iron supplementation once infection is treated.  - Recheck CBC in AM to confirm stability (hgb 10.6 > 9.7 > 9.1). Will stop VTE ppx as FOBT is positive. If hgb down further, may need GI consult.  HTN:  - Cotninue losartan, norvasc; hold loop diuretic   Right foot/ankle surgery 5/7, now w/gait impairment, debility:  - PT/OT consulted > SNF to be pursued, TOC aware.  - Pain control - Orthopedics follow up per routine  Depression:  - Continue wellbutrin, cymbalta  HLD:  - Continue statin  OAB:  - Pt had hx urinary retention, will hold fesoterodine, trospium for now.   Morbid obesity: Body mass index is 47.09 kg/m.   Tyrone Nine, MD [facility] [email or web address] 12/05/2022, 4:05 PM

## 2022-12-05 NOTE — TOC Progression Note (Signed)
Transition of Care [facility]) - Progression Note    Patient Details  Name: Tiffany Brown MRN: [id number] Date of Birth: Apr 20, 1946  Transition of Care [facility]) CM/SW Contact  Elliot Gault, LCSW Phone Number: 12/05/2022, 3:04 PM  Clinical Narrative:     TOC following. Bed offers reviewed with pt. Pt accepts [employer]. Insurance auth updated. Berkley Harvey is still pending. Likely dc tomorrow.  Expected Discharge Plan: Skilled Nursing Facility Barriers to Discharge: Continued Medical Work up  Expected Discharge Plan and Services In-house Referral: Clinical Social Work Discharge Planning Services: CM Consult Post Acute Care Choice: Skilled Nursing Facility Living arrangements for the past 2 months: Single Family Home                                       Social Determinants of Health (SDOH) Interventions SDOH Screenings   Food Insecurity: No Food Insecurity (12/04/2022)  Housing: Low Risk  (12/04/2022)  Transportation Needs: No Transportation Needs (12/04/2022)  Utilities: Not At Risk (12/04/2022)  Alcohol Screen: Low Risk  (09/26/2022)  Depression (PHQ2-9): Low Risk  (09/26/2022)  Recent Concern: Depression (PHQ2-9) - High Risk (08/16/2022)  Financial Resource Strain: Low Risk  (09/26/2022)  Physical Activity: Insufficiently Active (09/26/2022)  Social Connections: Moderately Isolated (09/26/2022)  Stress: No Stress Concern Present (09/26/2022)  Tobacco Use: Medium Risk (12/03/2022)    Readmission Risk Interventions     No data to display

## 2022-12-05 NOTE — Progress Notes (Signed)
Physical Therapy Treatment Patient Details Name: Tiffany Brown MRN: [id number] DOB: [birth year] Today's Date: 12/05/2022   History of Present Illness Tiffany Brown is a 77 y.o. female with medical history significant of depression and anxiety, HTN, GERD, fibromyalgia, melanoma, HLD, prediabetes who presented to Ed with complaints of nausea and diarrhea x 3 days.  She denies any blood in her stool and states just copious brown water. She states it is foul smelling. She denies eating any raw or undercooked foods or left overs. No travel.  She has intermittent lower abdomen pain. She states the pain is better after a BM. Pain rated as an 8/10 and is sharp in nature. No radiation. Eating or drinking seems to make the pain worse. She has been unable to get to the bathroom at home and can not get to the bathroom quick enough. She is pretty much bed bound. She was non weight bearing x 6-8 weeks. After this she was only supposed to have light touch and go.    PT Comments  Pt's activity participation limited due to B LE pain and weakness. Required mod-max assist to transfer and mod/max assist to side step to the chair using RW. Pt expressed greater pain in the L knee and ankle most prominently during transfer from bed to chair. Continues to wear boot on R LE as directed by physician. Pt tolerated sitting up in chair at end of session. Patient will benefit from continued skilled physical therapy in hospital and recommended venue below to increase strength, balance, endurance for safe ADLs and gait.    If plan is discharge home, recommend the following: A lot of help with bathing/dressing/bathroom;A lot of help with walking and/or transfers;Help with stairs or ramp for entrance;Assistance with cooking/housework   Can travel by private vehicle     No  Equipment Recommendations  None recommended by PT    Recommendations for Other Services       Precautions / Restrictions Precautions Precautions:  Fall Precaution Comments: R walking boot. Required Braces or Orthoses: Other Brace Other Brace: R boot Restrictions Weight Bearing Restrictions: Yes RLE Weight Bearing: Touchdown weight bearing     Mobility  Bed Mobility Overal bed mobility: Needs Assistance Bed Mobility: Supine to Sit     Supine to sit: Min assist, Mod assist, HOB elevated     General bed mobility comments: increased time, labored movement, had to use bed rails and HOB raised    Transfers Overall transfer level: Needs assistance Equipment used: Rolling walker (2 wheels) Transfers: Sit to/from Stand, Bed to chair/wheelchair/BSC Sit to Stand: Mod assist, Max assist   Step pivot transfers: Mod assist       General transfer comment: unsteady labored movement with fair/poor carryover for TTWB wearing walking boot on RLE, limited due to pain of B LE    Ambulation/Gait Ambulation/Gait assistance: Mod assist, Max assist Gait Distance (Feet): 4 Feet Assistive device: Rolling walker (2 wheels) Gait Pattern/deviations: Decreased step length - right, Decreased step length - left, Decreased stride length, Antalgic, Trunk flexed, Shuffle Gait velocity: slow     General Gait Details: limited to a few slow labored unsteady side steps with mostly shuffling of RLE due to weakness   Stairs             Wheelchair Mobility     Tilt Bed    Modified Rankin (Stroke Patients Only)       Balance Overall balance assessment: Needs assistance Sitting-balance support: Feet supported, No upper extremity  supported Sitting balance-Leahy Scale: Good Sitting balance - Comments: seated EOB   Standing balance support: Bilateral upper extremity supported, During functional activity, Reliant on assistive device for balance Standing balance-Leahy Scale: Poor Standing balance comment: using RW                            Cognition Arousal/Alertness: Awake/alert Behavior During Therapy: WFL for tasks  assessed/performed Overall Cognitive Status: Within Functional Limits for tasks assessed                                          Exercises General Exercises - Lower Extremity Ankle Circles/Pumps: AROM, Seated, Strengthening, Right, Left, 10 reps Long Arc Quad: AROM, Seated, Strengthening, Right, Left, 10 reps Hip Flexion/Marching: AROM, Seated, Strengthening, Right, Left, 10 reps Toe Raises: AROM, Seated, Right, Left, 10 reps Heel Raises: AROM, Seated, Strengthening, Right, Left, 10 reps    General Comments        Pertinent Vitals/Pain Pain Assessment Pain Assessment: Faces Faces Pain Scale: Hurts even more Pain Location: pain in B LE during transfer, L knee hurts the worst Pain Descriptors / Indicators: Discomfort, Grimacing, Aching Pain Intervention(s): Limited activity within patient's tolerance, Monitored during session    Home Living                          Prior Function            PT Goals (current goals can now be found in the care plan section) Acute Rehab PT Goals Patient Stated Goal: return home with family to assist after reahb PT Goal Formulation: With patient Time For Goal Achievement: 12/18/22 Potential to Achieve Goals: Good Progress towards PT goals: Progressing toward goals    Frequency    Min 3X/week      PT Plan Current plan remains appropriate    Co-evaluation              AM-PAC PT "6 Clicks" Mobility   Outcome Measure  Help needed turning from your back to your side while in a flat bed without using bedrails?: A Little Help needed moving from lying on your back to sitting on the side of a flat bed without using bedrails?: A Little Help needed moving to and from a bed to a chair (including a wheelchair)?: A Lot Help needed standing up from a chair using your arms (e.g., wheelchair or bedside chair)?: A Lot Help needed to walk in hospital room?: A Lot Help needed climbing 3-5 steps with a railing? :  Total 6 Click Score: 13    End of Session Equipment Utilized During Treatment: Gait belt Activity Tolerance: Patient tolerated treatment well;Patient limited by fatigue;Patient limited by pain Patient left: in chair;with call bell/phone within reach;with family/visitor present Nurse Communication: Mobility status PT Visit Diagnosis: Unsteadiness on feet (R26.81);Other abnormalities of gait and mobility (R26.89);Muscle weakness (generalized) (M62.81)     Time: 5409-8119 PT Time Calculation (min) (ACUTE ONLY): 20 min  Charges:    $Therapeutic Exercise: 8-22 mins $Therapeutic Activity: 8-22 mins PT General Charges $$ ACUTE PT VISIT: 1 Visit                    Anysha Frappier SPT [place], DPT Program

## 2022-12-06 DIAGNOSIS — K529 Noninfective gastroenteritis and colitis, unspecified: Secondary | ICD-10-CM | POA: Diagnosis not present

## 2022-12-06 LAB — CBC
HCT: 31.4 % — ABNORMAL LOW (ref 36.0–46.0)
Hemoglobin: 9.4 g/dL — ABNORMAL LOW (ref 12.0–15.0)
MCH: 24.8 pg — ABNORMAL LOW (ref 26.0–34.0)
MCHC: 29.9 g/dL — ABNORMAL LOW (ref 30.0–36.0)
MCV: 82.8 fL (ref 80.0–100.0)
Platelets: 261 10*3/uL (ref 150–400)
RBC: 3.79 MIL/uL — ABNORMAL LOW (ref 3.87–5.11)
RDW: 16.8 % — ABNORMAL HIGH (ref 11.5–15.5)
WBC: 8.1 10*3/uL (ref 4.0–10.5)
nRBC: 0 % (ref 0.0–0.2)

## 2022-12-06 NOTE — Progress Notes (Addendum)
[facility] PROGRESS NOTE  Tiffany Brown (DOB: 1946-01-09) [id number]:[id number] PCP: Dettinger, Elige Radon, MD  Brief Narrative: Tiffany Brown is a 77 y.o. female with a history of depression and anxiety, HTN, GERD, fibromyalgia, melanoma, HLD, prediabetes who presented to the ED on 12/03/2022 with 3 days of nausea and watery diarrhea. She had been at rehab after foot surgery 5/7 - 7/22, recently discharged home, was treated for UTI approximately 3 weeks ago, and still noticed significant debility. In the ED she was afebrile with leukocytosis (WBC 11.6k), hypokalemic, nitrite-positive pyuria on UA, and CT abd/pelvis demonstrating moderate descending/sigmoid colitis. GI pathogen panel and C. diff assay collected and the patient was admitted. She has profuse watery diarrhea requiring rectal tube, though studies have been negative. Ceftriaxone and flagyl as well as IVF started.  Subjective: No abd pain, no nausea, vomiting, diarrhea. No fevers.   Objective: BP 131/61 (BP Location: Left Arm)   Pulse 76   Temp 98.3 F (36.8 C)   Resp 17   Ht 5\' 5"  (1.651 m)   Wt 128.4 kg   SpO2 92%   BMI 47.09 kg/m   Gen: Frail female in no distress Pulm: Clear, nonlabored  CV: RRR, no MRG or edema GI: Soft, NT, ND, +BS   Assessment & Plan: Descending, sigmoid colitis: Negative GI panel and C. diff, though has recent healthcare exposure and abx. No acidosis, reassuring exam. No gross bleeding. Diarrhea and abdominal pain resolved. - Continue antibiotics, can convert to po at this time. WBC normalized. - Recommend outpatient GI evaluation after at least 4-6 weeks.  E. coli UTI:  - Ceftriaxone given as above. Has completed the recommended duration of therapy for this indication.   Hypokalemia: Primarily suspect GI losses, inadequate po replenishment. Has durably resolved, no longer requires IV fluids.    Hypochromic anemia: With ferritin normal (41), iron (14) and % sat (4%) are low.  - Would likely  benefit from iron supplementation once infection is treated.  - Has stabilized with hgb 10.6 > 9.7 > 9.1 > 9.4). - Positive FOBT: Recommend outpatient GI evaluation  HTN:  - Continue losartan, norvasc. Normotensive.  Right foot/ankle surgery 5/7, now w/gait impairment, debility:  - PT/OT consulted > SNF to be pursued, TOC aware. Peer to peer this afternoon with Dr. Derinda Late states that the patient is at too poor a baseline functional status to qualify for SNF. Despite my efforts, request was denied.  - Pain control with tylenol, limit sedating medications. - Orthopedics follow up per routine  Depression:  - Continue wellbutrin, cymbalta  HLD:  - Continue statin  OAB:  - Pt had hx urinary retention, holding fesoterodine, trospium for now.   Morbid obesity: Body mass index is 47.09 kg/m.   Tyrone Nine, MD [facility] [email or web address] 12/06/2022, 2:15 PM

## 2022-12-06 NOTE — Care Management Important Message (Signed)
Important Message  Patient Details  Name: Tiffany Brown MRN: [id number] Date of Birth: [birth year]   Medicare Important Message Given:  Yes     [name] 12/06/2022, 3:56 PM

## 2022-12-06 NOTE — Plan of Care (Signed)

## 2022-12-06 NOTE — TOC Progression Note (Signed)
Transition of Care [facility] - Progression Note    Patient Details  Name: Tiffany Brown MRN: [id number] Date of Birth: November 22, 1945  Transition of Care [facility]) CM/SW Contact  Elliot Gault, LCSW Phone Number: 12/06/2022, 4:18 PM  Clinical Narrative:     TOC following. Insurance offered Peer to Peer for SNF auth which was completed by Dr. Jarvis Newcomer. SNF auth denial was then issued by insurance. Updated pt and provided phone number for "fast appeal" to pt. She stated she would call and initiate the appeal. Per pt, she does not feel she can manage at home with [facility] and she does not have option to pay privately for SNF.  TOC will await the determination.   Expected Discharge Plan: Skilled Nursing Facility Barriers to Discharge: Continued Medical Work up  Expected Discharge Plan and Services In-house Referral: Clinical Social Work Discharge Planning Services: CM Consult Post Acute Care Choice: Skilled Nursing Facility Living arrangements for the past 2 months: Single Family Home                                       Social Determinants of Health (SDOH) Interventions SDOH Screenings   Food Insecurity: No Food Insecurity (12/04/2022)  Housing: Low Risk  (12/04/2022)  Transportation Needs: No Transportation Needs (12/04/2022)  Utilities: Not At Risk (12/04/2022)  Alcohol Screen: Low Risk  (09/26/2022)  Depression (PHQ2-9): Low Risk  (09/26/2022)  Recent Concern: Depression (PHQ2-9) - High Risk (08/16/2022)  Financial Resource Strain: Low Risk  (09/26/2022)  Physical Activity: Insufficiently Active (09/26/2022)  Social Connections: Moderately Isolated (09/26/2022)  Stress: No Stress Concern Present (09/26/2022)  Tobacco Use: Medium Risk (12/03/2022)    Readmission Risk Interventions     No data to display

## 2022-12-07 DIAGNOSIS — K529 Noninfective gastroenteritis and colitis, unspecified: Secondary | ICD-10-CM | POA: Diagnosis not present

## 2022-12-07 MED ORDER — PANTOPRAZOLE SODIUM 40 MG PO TBEC
40.0000 mg | DELAYED_RELEASE_TABLET | Freq: Every day | ORAL | Status: DC
  Administered 2022-12-07 – 2022-12-10 (×4): 40 mg via ORAL
  Filled 2022-12-07 (×4): qty 1

## 2022-12-07 MED ORDER — SENNOSIDES-DOCUSATE SODIUM 8.6-50 MG PO TABS
1.0000 | ORAL_TABLET | Freq: Every day | ORAL | Status: DC
  Administered 2022-12-07 – 2022-12-08 (×2): 1 via ORAL
  Filled 2022-12-07 (×4): qty 1

## 2022-12-07 MED ORDER — METRONIDAZOLE 500 MG PO TABS
500.0000 mg | ORAL_TABLET | Freq: Two times a day (BID) | ORAL | Status: AC
  Administered 2022-12-07 – 2022-12-08 (×3): 500 mg via ORAL
  Filled 2022-12-07 (×3): qty 1

## 2022-12-07 MED ORDER — SODIUM CHLORIDE 0.9 % IV SOLN
2.0000 g | INTRAVENOUS | Status: DC
  Administered 2022-12-07 – 2022-12-08 (×2): 2 g via INTRAVENOUS
  Filled 2022-12-07 (×2): qty 20

## 2022-12-07 MED ORDER — ORAL CARE MOUTH RINSE: 15.0000 mL | OROMUCOSAL | Status: DC | PRN

## 2022-12-07 NOTE — Progress Notes (Signed)
[facility] PROGRESS NOTE  Tiffany Brown (DOB: 1945-08-14) ZOX:[id number] PCP: Dettinger, Elige Radon, MD  Brief Narrative: Tiffany Brown is a 77 y.o. female with a history of depression and anxiety, HTN, GERD, fibromyalgia, melanoma, HLD, prediabetes, right foot/ankle surgery who presented to the ED on 12/03/2022 with 3 days of nausea and watery diarrhea. She had been at rehab after foot surgery 5/7 - 7/22, recently discharged home, was treated for UTI approximately 3 weeks ago, and still noticed significant debility. In the ED she was afebrile with leukocytosis (WBC 11.6k), hypokalemic, nitrite-positive pyuria on UA, and CT abd/pelvis demonstrating moderate descending/sigmoid colitis. GI pathogen panel and C. diff assay negative. Ceftriaxone, flagyl, and IVF were given. Symptoms have resolved and she's now stable for discharge, but will require a SNF level of care. Coverage was declined by her insurance after peer-to-peer and expedited appeal is in process.   Subjective: Eating fine, some nausea, no diarrhea or fevers.   Objective: BP (!) 130/47 (BP Location: Left Arm)   Pulse 75   Temp 98.4 F (36.9 C) (Oral)   Resp 17   Ht 5\' 5"  (1.651 m)   Wt 128.4 kg   SpO2 93%   BMI 47.09 kg/m   Frail female in no acute distress Clear, nonlabored RRR, no MRG  Soft, NT, ND, +BS  Assessment & Plan: Descending, sigmoid colitis: Negative GI panel and C. diff, though has recent healthcare exposure and abx. No acidosis, reassuring exam. No gross bleeding. Diarrhea and abdominal pain resolved. - Complete the course of antibiotics. WBC normalized. - Recommend outpatient GI evaluation after at least 4-6 weeks. - Now no BM for a couple days, start senokot. Aim to avoid overcorrection either way.   E. coli UTI:  - Ceftriaxone given as above. Has completed the recommended duration of therapy for this indication.   Hypokalemia: Primarily suspect GI losses, inadequate po replenishment. Has durably  resolved, no longer requires IV fluids.    Hypochromic anemia: With ferritin normal (41), iron (14) and % sat (4%) are low.  - Would likely benefit from iron supplementation once infection is treated.  - Has stabilized with hgb 10.6 > 9.7 > 9.1 > 9.4). - Positive FOBT: Recommend outpatient GI evaluation  HTN:  - Continue losartan, norvasc. Normotensive.  Right foot/ankle surgery 5/7, now w/gait impairment, debility:  - PT/OT consulted > SNF to be pursued, TOC aware. Peer to peer with Dr. Derinda Late 8/2 stated that the patient is at too poor a baseline functional status to qualify for SNF. Despite my disagreement, coverage was denied, appeal now in process.  - Pain control with tylenol, limit sedating medications. - Orthopedics follow up per routine  Depression:  - Continue wellbutrin, cymbalta  HLD:  - Continue statin  OAB:  - Pt had hx urinary retention, holding fesoterodine, trospium for now.   Morbid obesity: Body mass index is 47.09 kg/m.   Tyrone Nine, MD [facility] [email or web address] 12/07/2022, 3:20 PM

## 2022-12-08 DIAGNOSIS — D649 Anemia, unspecified: Secondary | ICD-10-CM | POA: Diagnosis not present

## 2022-12-08 DIAGNOSIS — K529 Noninfective gastroenteritis and colitis, unspecified: Secondary | ICD-10-CM | POA: Diagnosis not present

## 2022-12-08 DIAGNOSIS — R262 Difficulty in walking, not elsewhere classified: Secondary | ICD-10-CM

## 2022-12-08 NOTE — Progress Notes (Signed)
[facility] PROGRESS NOTE   Tiffany Brown UJW:[id number] DOB: 10/17/1945 DOA: 12/03/2022  PCP: Dettinger, Elige Radon, MD  Brief History/Interval Summary: 77 y.o. female with a history of depression and anxiety, HTN, GERD, fibromyalgia, melanoma, HLD, prediabetes, right foot/ankle surgery who presented to the ED on 12/03/2022 with 3 days of nausea and watery diarrhea. She had been at rehab after foot surgery 5/7 - 7/22, recently discharged home, was treated for UTI approximately 3 weeks ago, and still noticed significant debility. In the ED she was afebrile with leukocytosis (WBC 11.6k), hypokalemic, nitrite-positive pyuria on UA, and CT abd/pelvis demonstrating moderate descending/sigmoid colitis. GI pathogen panel and C. diff assay negative. Ceftriaxone, flagyl, and IVF were given. Symptoms have resolved and she's now stable for discharge, but will require a SNF level of care. Coverage was declined by her insurance after peer-to-peer and expedited appeal is in process.    Subjective/Interval History: Denies any complaints this morning.  Waiting for decision regarding her appeal to the insurance company for short-term rehab authorization.    Assessment/Plan:  Descending, sigmoid colitis Negative GI panel and C. diff, though has recent healthcare exposure and abx. No acidosis, reassuring exam. No gross bleeding.  Diarrhea and abdominal pain resolved. Noted to be on ceftriaxone and metronidazole.  Will plan for a 7 to 10-day course.  Today appears to be day 5. - Recommend outpatient GI evaluation after at least 4-6 weeks.   E. coli UTI:  - Ceftriaxone given as above.    Hypokalemia Secondary to GI loss.  Now improved.   Hypochromic anemia With ferritin normal (41), iron (14) and % sat (4%) are low.  Start iron supplements at discharge. - Positive FOBT: Recommend outpatient GI evaluation Hemoglobin has been stable.   Essential hypertension:  - Continue losartan,  norvasc. Blood pressure is reasonably well-controlled.  Right foot/ankle surgery 5/7, now w/gait impairment, debility:  Pain is reasonably well-controlled.  Orthopedics follow-up.   Depression:  - Continue wellbutrin, cymbalta   HLD:  - Continue statin   OAB:  - Pt had hx urinary retention, holding fesoterodine, trospium for now.    Class III obesity Estimated body mass index is 47.09 kg/m as calculated from the following:   Height as of this encounter: 5\' 5"  (1.651 m).   Weight as of this encounter: 128.4 kg.   DVT Prophylaxis: Lovenox Code Status: Full code Family Communication: Discussed with patient Disposition Plan: Short-term rehab was denied by her insurance.  Peer-to-peer was done by Dr. Jarvis Newcomer.  Patient has appealed.  Await decision by insurance company.     Medications: Scheduled:  amLODipine  5 mg Oral QHS   buPROPion  150 mg Oral QHS   DULoxetine  60 mg Oral BID   enoxaparin (LOVENOX) injection  40 mg Subcutaneous Q24H   gabapentin  300 mg Oral TID   losartan  100 mg Oral Daily   metroNIDAZOLE  500 mg Oral Q12H   pantoprazole  40 mg Oral Daily   pravastatin  80 mg Oral Daily   senna-docusate  1 tablet Oral Daily   Continuous:  cefTRIAXone (ROCEPHIN)  IV 2 g (12/07/22 2143)   FAO:ZHYQMVHQIONGE **OR** acetaminophen, Muscle Rub, ondansetron **OR** ondansetron (ZOFRAN) IV, mouth rinse  Antibiotics: Anti-infectives (From admission, onward)    Start     Dose/Rate Route Frequency Ordered Stop   12/07/22 2200  metroNIDAZOLE (FLAGYL) tablet 500 mg        500 mg Oral Every 12 hours 12/07/22 1524 12/09/22 0959  12/07/22 2200  cefTRIAXone (ROCEPHIN) 2 g in sodium chloride 0.9 % 100 mL IVPB        2 g 200 mL/hr over 30 Minutes Intravenous Every 24 hours 12/07/22 1524     12/04/22 2200  cefTRIAXone (ROCEPHIN) 2 g in sodium chloride 0.9 % 100 mL IVPB  Status:  Discontinued        2 g 200 mL/hr over 30 Minutes Intravenous Every 24 hours 12/04/22 1107 12/07/22  1524   12/04/22 1200  metroNIDAZOLE (FLAGYL) tablet 500 mg  Status:  Discontinued        500 mg Oral Every 12 hours 12/04/22 1107 12/07/22 1524   12/03/22 1945  cefTRIAXone (ROCEPHIN) 1 g in sodium chloride 0.9 % 100 mL IVPB        1 g 200 mL/hr over 30 Minutes Intravenous  Once 12/03/22 1941 12/03/22 2140       Objective:  Vital Signs  Vitals:   12/07/22 0425 12/07/22 1325 12/07/22 1916 12/08/22 0531  BP: 131/77 (!) 130/47 (!) 149/59 (!) 140/64  Pulse: 79 75 75 65  Resp: 19 17 18 18   Temp: 98.1 F (36.7 C) 98.4 F (36.9 C) 99.1 F (37.3 C) 97.8 F (36.6 C)  TempSrc: Oral Oral Oral   SpO2: 100% 93% 93% 91%  Weight:      Height:        Intake/Output Summary (Last 24 hours) at 12/08/2022 1035 Last data filed at 12/08/2022 0537 Gross per 24 hour  Intake 480 ml  Output 600 ml  Net -120 ml   Filed Weights   12/03/22 1302  Weight: 128.4 kg    General appearance: Awake alert.  In no distress Resp: Clear to auscultation bilaterally.  Normal effort Cardio: S1-S2 is normal regular.  No S3-S4.  No rubs murmurs or bruit GI: Abdomen is soft.  Nontender nondistended.  Bowel sounds are present normal.  No masses organomegaly   Lab Results:  Data Reviewed: I have personally reviewed following labs and reports of the imaging studies  CBC: Recent Labs  Lab 12/03/22 1346 12/04/22 0336 12/05/22 0356 12/06/22 0419  WBC 11.6* 9.9 8.1 8.1  NEUTROABS 7.5  --   --   --   HGB 10.6* 9.7* 9.1* 9.4*  HCT 34.9* 31.2* 30.1* 31.4*  MCV 81.0 81.5 82.5 82.8  PLT 210 224 240 261    Basic Metabolic Panel: Recent Labs  Lab 12/03/22 1346 12/04/22 0336 12/05/22 0356 12/06/22 0419 12/07/22 0355  NA 138 140 140 142 138  K 2.9* 3.1* 3.3* 3.7 3.5  CL 105 107 105 107 101  CO2 25 25 25 27 28   GLUCOSE 98 103* 99 108* 108*  BUN 10 7* 8 7* 5*  CREATININE 0.59 0.57 0.61 0.61 0.57  CALCIUM 8.3* 8.2* 8.1* 8.4* 8.5*  MG 1.9  --  1.9  --   --     GFR: Estimated Creatinine Clearance:  78.3 mL/min (by C-G formula based on SCr of 0.57 mg/dL).  Liver Function Tests: Recent Labs  Lab 12/03/22 1346  AST 12*  ALT 14  ALKPHOS 99  BILITOT 0.3  PROT 6.4*  ALBUMIN 3.0*     Recent Results (from the past 240 hour(s))  C Difficile Quick Screen w PCR reflex     Status: Abnormal   Collection Time: 12/03/22  5:04 PM   Specimen: In/Out Cath Urine; Stool  Result Value Ref Range Status   C Diff antigen (A) NEGATIVE Final    INVALID, POSSIBLE  SAMPLE INTEGRITY COMPROMISED, TEST WAS REPEATED    Comment: RESULT CALLED TO, READ BACK BY AND VERIFIED WITH: A.FLETCHER AT 1845 ON 07.30.24 BY ADGER J     C Diff toxin (A) NEGATIVE Final    INVALID, POSSIBLE SAMPLE INTEGRITY COMPROMISED, TEST WAS REPEATED    Comment: RESULT CALLED TO, READ BACK BY AND VERIFIED WITH: A. FLETCHER AT 1845 ON 07.30.24 BY ADGER J     C Diff interpretation   Final    INVALID, POSSIBLE SAMPLE INTEGRITY COMPROMISED, TEST WAS REPEATED    Comment: RESULT CALLED TO, READ BACK BY AND VERIFIED WITH: A.FLETCHER AT 1845 ON 07.30.24 BY ADGER J  Performed at [facility], [address], [place], [place] [address]   Gastrointestinal Panel by PCR , Stool     Status: None   Collection Time: 12/03/22  5:04 PM   Specimen: In/Out Cath Urine; Stool  Result Value Ref Range Status   Campylobacter species NOT DETECTED NOT DETECTED Final   Plesimonas shigelloides NOT DETECTED NOT DETECTED Final   Salmonella species NOT DETECTED NOT DETECTED Final   Yersinia enterocolitica NOT DETECTED NOT DETECTED Final   Vibrio species NOT DETECTED NOT DETECTED Final   Vibrio cholerae NOT DETECTED NOT DETECTED Final   Enteroaggregative E coli (EAEC) NOT DETECTED NOT DETECTED Final   Enteropathogenic E coli (EPEC) NOT DETECTED NOT DETECTED Final   Enterotoxigenic E coli (ETEC) NOT DETECTED NOT DETECTED Final   Shiga like toxin producing E coli (STEC) NOT DETECTED NOT DETECTED Final   Shigella/Enteroinvasive E coli (EIEC) NOT DETECTED NOT  DETECTED Final   Cryptosporidium NOT DETECTED NOT DETECTED Final   Cyclospora cayetanensis NOT DETECTED NOT DETECTED Final   Entamoeba histolytica NOT DETECTED NOT DETECTED Final   Giardia lamblia NOT DETECTED NOT DETECTED Final   Adenovirus F40/41 NOT DETECTED NOT DETECTED Final   Astrovirus NOT DETECTED NOT DETECTED Final   Norovirus GI/GII NOT DETECTED NOT DETECTED Final   Rotavirus A NOT DETECTED NOT DETECTED Final   Sapovirus (I, II, IV, and V) NOT DETECTED NOT DETECTED Final    Comment: Performed at [facility], [address], [place], [place] [address]  Urine Culture     Status: Abnormal   Collection Time: 12/03/22  5:04 PM   Specimen: Urine, Random  Result Value Ref Range Status   Specimen Description   Final    URINE, RANDOM Performed at [facility], [address] [place], [place] [address]    Special Requests   Final    NONE Reflexed from [id number] Performed at [facility], [address], [place], [place] [address]    Culture >=100,000 COLONIES/mL ESCHERICHIA COLI (A)  Final   Report Status 12/05/2022 FINAL  Final   Organism ID, Bacteria ESCHERICHIA COLI (A)  Final      Susceptibility   Escherichia coli - MIC*    AMPICILLIN 4 SENSITIVE Sensitive     CEFAZOLIN <=4 SENSITIVE Sensitive     CEFEPIME <=0.12 SENSITIVE Sensitive     CEFTRIAXONE <=0.25 SENSITIVE Sensitive     CIPROFLOXACIN <=0.25 SENSITIVE Sensitive     GENTAMICIN <=1 SENSITIVE Sensitive     IMIPENEM <=0.25 SENSITIVE Sensitive     NITROFURANTOIN <=16 SENSITIVE Sensitive     TRIMETH/SULFA <=20 SENSITIVE Sensitive     AMPICILLIN/SULBACTAM <=2 SENSITIVE Sensitive     PIP/TAZO <=4 SENSITIVE Sensitive     * >=100,000 COLONIES/mL ESCHERICHIA COLI  C Difficile Quick Screen w PCR reflex     Status: None  Collection Time: 12/03/22  5:54 PM   Specimen: STOOL  Result Value Ref Range Status   C Diff antigen NEGATIVE NEGATIVE Final   C Diff toxin NEGATIVE NEGATIVE Final   C Diff interpretation No C.  difficile detected.  Final    Comment: Performed at [facility], [address], [place], [place] [address]      Radiology Studies: No results found.     LOS: 4 days    [employer] on [email or web address]  12/08/2022, 10:35 AM

## 2022-12-09 ENCOUNTER — Inpatient Hospital Stay: Payer: Medicare Other | Admitting: Family Medicine

## 2022-12-09 DIAGNOSIS — K529 Noninfective gastroenteritis and colitis, unspecified: Secondary | ICD-10-CM | POA: Diagnosis not present

## 2022-12-09 MED ORDER — CEPHALEXIN 500 MG PO CAPS
500.0000 mg | ORAL_CAPSULE | Freq: Three times a day (TID) | ORAL | Status: DC
  Administered 2022-12-09 – 2022-12-10 (×3): 500 mg via ORAL
  Filled 2022-12-09 (×3): qty 1

## 2022-12-09 MED ORDER — POTASSIUM CHLORIDE CRYS ER 20 MEQ PO TBCR
40.0000 meq | EXTENDED_RELEASE_TABLET | Freq: Once | ORAL | Status: AC
  Administered 2022-12-09: 40 meq via ORAL
  Filled 2022-12-09: qty 2

## 2022-12-09 MED ORDER — FERROUS SULFATE 325 (65 FE) MG PO TBEC: 325.0000 mg | DELAYED_RELEASE_TABLET | Freq: Two times a day (BID) | ORAL | 3 refills | Status: AC

## 2022-12-09 MED ORDER — CEPHALEXIN 500 MG PO CAPS: 500.0000 mg | ORAL_CAPSULE | Freq: Three times a day (TID) | ORAL | 0 refills | Status: AC

## 2022-12-09 MED ORDER — FUROSEMIDE 40 MG PO TABS: 20.0000 mg | ORAL_TABLET | Freq: Every day | ORAL | 3 refills | Status: AC

## 2022-12-09 MED ORDER — PANTOPRAZOLE SODIUM 40 MG PO TBEC: 40.0000 mg | DELAYED_RELEASE_TABLET | Freq: Every day | ORAL | 0 refills | Status: DC

## 2022-12-09 MED ORDER — POTASSIUM CHLORIDE CRYS ER 20 MEQ PO TBCR: 20.0000 meq | EXTENDED_RELEASE_TABLET | Freq: Every day | ORAL | 0 refills | Status: AC

## 2022-12-09 MED ORDER — SENNOSIDES-DOCUSATE SODIUM 8.6-50 MG PO TABS: 1.0000 | ORAL_TABLET | Freq: Every day | ORAL | 0 refills | Status: AC

## 2022-12-09 MED ORDER — POTASSIUM CHLORIDE CRYS ER 20 MEQ PO TBCR
20.0000 meq | EXTENDED_RELEASE_TABLET | Freq: Every day | ORAL | Status: DC
  Administered 2022-12-10: 20 meq via ORAL
  Filled 2022-12-09 (×2): qty 1

## 2022-12-09 NOTE — Progress Notes (Signed)
Physical Therapy Treatment Patient Details Name: Tiffany Brown MRN: [id number] DOB: [birth year] Today's Date: 12/09/2022   History of Present Illness Tiffany Brown is a 77 y.o. female with medical history significant of depression and anxiety, HTN, GERD, fibromyalgia, melanoma, HLD, prediabetes who presented to Ed with complaints of nausea and diarrhea x 3 days.  She denies any blood in her stool and states just copious brown water. She states it is foul smelling. She denies eating any raw or undercooked foods or left overs. No travel.  She has intermittent lower abdomen pain. She states the pain is better after a BM. Pain rated as an 8/10 and is sharp in nature. No radiation. Eating or drinking seems to make the pain worse. She has been unable to get to the bathroom at home and can not get to the bathroom quick enough. She is pretty much bed bound. She was non weight bearing x 6-8 weeks. After this she was only supposed to have light touch and go.    PT Comments  Pt sitting in chair beside bed and willing to participate with therapy today.  Pt presents with LE weakness noted by max A required with transfer from sit to stand, cueing for hand placement and mechanics.  Pt presents with extreme difficulty demonstrating TTWB with use of RW. LE exercises completed in seated position for strengthening with min cueing to improve hold time for maximal benefits with exercise.  EOS pt left in chair with call bell within reach and husband in room.    If plan is discharge home, recommend the following:     Can travel by private vehicle        Equipment Recommendations       Recommendations for Other Services       Precautions / Restrictions Precautions Precautions: Fall Precaution Comments: R walking boot. Required Braces or Orthoses: Other Brace Other Brace: R boot Restrictions Weight Bearing Restrictions: Yes RLE Weight Bearing: Touchdown weight bearing     Mobility  Bed Mobility                General bed mobility comments: Pt sitting in chair upon entrance    Transfers Overall transfer level: Needs assistance Equipment used: Rolling walker (2 wheels)   Sit to Stand: Max assist           General transfer comment: unsteady labored movement with fair/poor carryover for TTWB wearing walking boot on RLE, limited due to pain of B LE    Ambulation/Gait                   Stairs             Wheelchair Mobility     Tilt Bed    Modified Rankin (Stroke Patients Only)       Balance                                            Cognition Arousal/Alertness: Awake/alert Behavior During Therapy: WFL for tasks assessed/performed Overall Cognitive Status: Within Functional Limits for tasks assessed                                          Exercises General Exercises - Lower Extremity Ankle Circles/Pumps: AROM, Both, Seated, Strengthening  Long [employer]: AROM, Seated, Strengthening, Right, Left, 10 reps Hip ABduction/ADduction: AROM, Both, Strengthening, 10 reps, Seated Hip Flexion/Marching: AROM, Seated, Strengthening, Right, Left, 10 reps Toe Raises: AROM, Seated, Right, Left, 10 reps Heel Raises: AROM, Seated, Strengthening, Right, Left, 10 reps    General Comments        Pertinent Vitals/Pain Pain Assessment Pain Assessment: No/denies pain    Home Living                          Prior Function            PT Goals (current goals can now be found in the care plan section)      Frequency           PT Plan Current plan remains appropriate    Co-evaluation              AM-PAC PT "6 Clicks" Mobility   Outcome Measure  Help needed turning from your back to your side while in a flat bed without using bedrails?: A Little Help needed moving from lying on your back to sitting on the side of a flat bed without using bedrails?: A Little Help needed moving to and from a bed  to a chair (including a wheelchair)?: A Lot Help needed standing up from a chair using your arms (e.g., wheelchair or bedside chair)?: A Lot Help needed to walk in hospital room?: A Lot Help needed climbing 3-5 steps with a railing? : Total 6 Click Score: 13    End of Session Equipment Utilized During Treatment: Gait belt Activity Tolerance: Patient tolerated treatment well;Patient limited by fatigue;Patient limited by pain Patient left: in chair;with call bell/phone within reach;with family/visitor present Nurse Communication: Mobility status       Time: 1478-2956 PT Time Calculation (min) (ACUTE ONLY): 22 min  Charges:    $Therapeutic Activity: 8-22 mins PT General Charges $$ ACUTE PT VISIT: 1 Visit                    Becky Sax, LPTA/CLT; CBIS [phone]  Juel Burrow 12/09/2022, 2:10 PM

## 2022-12-09 NOTE — Discharge Summary (Signed)
[facility]  Physician Discharge Summary   Patient ID: Tiffany Brown MRN: [id number] DOB/AGE: 10-01-1945 77 y.o.  Admit date: 12/03/2022 Discharge date:   12/09/2022   PCP: Dettinger, Elige Radon, MD  DISCHARGE DIAGNOSES:  Acute Colitis   Hypokalemia   Normocytic anemia   Cystitis   Ambulatory dysfunction   DM type 2 with diabetic dyslipidemia (HCC)   Essential hypertension, benign   Hyperlipidemia   Recurrent major depressive disorder, in full remission (HCC)   Fibromyalgia   RECOMMENDATIONS FOR OUTPATIENT FOLLOW UP: CBC and basic metabolic panel to be checked in 1 week Patient will need follow-up with gastroenterology in 4 to 6 weeks.     Home Health: Hopefully to SNF later today Equipment/Devices: None  CODE STATUS: Full code  DISCHARGE CONDITION: fair  Diet recommendation: Modified carbohydrate  INITIAL HISTORY: 77 y.o. female with a history of depression and anxiety, HTN, GERD, fibromyalgia, melanoma, HLD, prediabetes, right foot/ankle surgery who presented to the ED on 12/03/2022 with 3 days of nausea and watery diarrhea. She had been at rehab after foot surgery 5/7 - 7/22, recently discharged home, was treated for UTI approximately 3 weeks ago, and still noticed significant debility. In the ED she was afebrile with leukocytosis (WBC 11.6k), hypokalemic, nitrite-positive pyuria on UA, and CT abd/pelvis demonstrating moderate descending/sigmoid colitis. GI pathogen panel and C. diff assay negative. Ceftriaxone, flagyl, and IVF were given. Symptoms have resolved and she's now stable for discharge, but will require a SNF level of care. Coverage was declined by her insurance after peer-to-peer and expedited appeal is in process.    HOSPITAL COURSE:   Descending, sigmoid colitis Negative GI panel and C. diff, though has recent healthcare exposure and abx.  Patient's symptoms resolved.  She was placed on ceftriaxone and metronidazole.  Completed course of  metronidazole.  Will change to Keflex at discharge.   Recommend outpatient GI evaluation after at least 4-6 weeks.   E. coli UTI:  Status post treatment with ceftriaxone   Hypokalemia Secondary to GI loss.  Will be supplemented prior to discharge.  Will recommend low-dose daily potassium supplements.   Hypochromic anemia With ferritin normal (41), iron (14) and % sat (4%) are low.  Start iron supplements at discharge. - Positive FOBT: Recommend outpatient GI evaluation.  Looks like she is followed by [facility] physicians.  This can be facilitated by her primary care provider. Hemoglobin has been stable.   Essential hypertension:  - Continue losartan, norvasc. Blood pressure is reasonably well-controlled.   Right foot/ankle surgery 5/7, now w/gait impairment, debility:  Pain is reasonably well-controlled.  Orthopedics follow-up.  Surgery was performed by Dr. Susa Simmonds.   Depression:  - Continue wellbutrin, cymbalta   HLD:  - Continue statin   OAB:  Resume home medications.   Class III obesity Estimated body mass index is 47.09 kg/m as calculated from the following:   Height as of this encounter: 5\' 5"  (1.651 m).   Weight as of this encounter: 128.4 kg.   Patient is stable.  Waiting on SNF.  Okay for discharge when bed is available.   PERTINENT LABS:  The results of significant diagnostics from this hospitalization (including imaging, microbiology, ancillary and laboratory) are listed below for reference.    Microbiology: Recent Results (from the past 240 hour(s))  C Difficile Quick Screen w PCR reflex     Status: Abnormal   Collection Time: 12/03/22  5:04 PM   Specimen: In/Out Cath Urine; Stool  Result Value Ref Range Status  C Diff antigen (A) NEGATIVE Final    INVALID, POSSIBLE SAMPLE INTEGRITY COMPROMISED, TEST WAS REPEATED    Comment: RESULT CALLED TO, READ BACK BY AND VERIFIED WITH: A.FLETCHER AT 1845 ON 07.30.24 BY ADGER J     C Diff toxin (A) NEGATIVE Final     INVALID, POSSIBLE SAMPLE INTEGRITY COMPROMISED, TEST WAS REPEATED    Comment: RESULT CALLED TO, READ BACK BY AND VERIFIED WITH: A. FLETCHER AT 1845 ON 07.30.24 BY ADGER J     C Diff interpretation   Final    INVALID, POSSIBLE SAMPLE INTEGRITY COMPROMISED, TEST WAS REPEATED    Comment: RESULT CALLED TO, READ BACK BY AND VERIFIED WITH: A.FLETCHER AT 1845 ON 07.30.24 BY ADGER J  Performed at [facility], [address], [place], [place] [address]   Gastrointestinal Panel by PCR , Stool     Status: None   Collection Time: 12/03/22  5:04 PM   Specimen: In/Out Cath Urine; Stool  Result Value Ref Range Status   Campylobacter species NOT DETECTED NOT DETECTED Final   Plesimonas shigelloides NOT DETECTED NOT DETECTED Final   Salmonella species NOT DETECTED NOT DETECTED Final   Yersinia enterocolitica NOT DETECTED NOT DETECTED Final   Vibrio species NOT DETECTED NOT DETECTED Final   Vibrio cholerae NOT DETECTED NOT DETECTED Final   Enteroaggregative E coli (EAEC) NOT DETECTED NOT DETECTED Final   Enteropathogenic E coli (EPEC) NOT DETECTED NOT DETECTED Final   Enterotoxigenic E coli (ETEC) NOT DETECTED NOT DETECTED Final   Shiga like toxin producing E coli (STEC) NOT DETECTED NOT DETECTED Final   Shigella/Enteroinvasive E coli (EIEC) NOT DETECTED NOT DETECTED Final   Cryptosporidium NOT DETECTED NOT DETECTED Final   Cyclospora cayetanensis NOT DETECTED NOT DETECTED Final   Entamoeba histolytica NOT DETECTED NOT DETECTED Final   Giardia lamblia NOT DETECTED NOT DETECTED Final   Adenovirus F40/41 NOT DETECTED NOT DETECTED Final   Astrovirus NOT DETECTED NOT DETECTED Final   Norovirus GI/GII NOT DETECTED NOT DETECTED Final   Rotavirus A NOT DETECTED NOT DETECTED Final   Sapovirus (I, II, IV, and V) NOT DETECTED NOT DETECTED Final    Comment: Performed at [facility], [address], [place], [place] [address]  Urine Culture     Status: Abnormal   Collection Time: 12/03/22  5:04 PM    Specimen: Urine, Random  Result Value Ref Range Status   Specimen Description   Final    URINE, RANDOM Performed at [facility], [address] [place], [place] [address]    Special Requests   Final    NONE Reflexed from [id number] Performed at [facility], [address], [place], [place] [address]    Culture >=100,000 COLONIES/mL ESCHERICHIA COLI (A)  Final   Report Status 12/05/2022 FINAL  Final   Organism ID, Bacteria ESCHERICHIA COLI (A)  Final      Susceptibility   Escherichia coli - MIC*    AMPICILLIN 4 SENSITIVE Sensitive     CEFAZOLIN <=4 SENSITIVE Sensitive     CEFEPIME <=0.12 SENSITIVE Sensitive     CEFTRIAXONE <=0.25 SENSITIVE Sensitive     CIPROFLOXACIN <=0.25 SENSITIVE Sensitive     GENTAMICIN <=1 SENSITIVE Sensitive     IMIPENEM <=0.25 SENSITIVE Sensitive     NITROFURANTOIN <=16 SENSITIVE Sensitive     TRIMETH/SULFA <=20 SENSITIVE Sensitive     AMPICILLIN/SULBACTAM <=2 SENSITIVE Sensitive     PIP/TAZO <=4 SENSITIVE Sensitive     * >=100,000 COLONIES/mL ESCHERICHIA COLI  C Difficile  Quick Screen w PCR reflex     Status: None   Collection Time: 12/03/22  5:54 PM   Specimen: STOOL  Result Value Ref Range Status   C Diff antigen NEGATIVE NEGATIVE Final   C Diff toxin NEGATIVE NEGATIVE Final   C Diff interpretation No C. difficile detected.  Final    Comment: Performed at [facility] [address], [place], [place] [address]     Labs:   Basic Metabolic Panel: Recent Labs  Lab 12/03/22 1346 12/04/22 0336 12/05/22 0356 12/06/22 0419 12/07/22 0355 12/09/22 0432  NA 138 140 140 142 138 140  K 2.9* 3.1* 3.3* 3.7 3.5 3.2*  CL 105 107 105 107 101 105  CO2 25 25 25 27 28 28   GLUCOSE 98 103* 99 108* 108* 97  BUN 10 7* 8 7* 5* 10  CREATININE 0.59 0.57 0.61 0.61 0.57 0.66  CALCIUM 8.3* 8.2* 8.1* 8.4* 8.5* 8.4*  MG 1.9  --  1.9  --   --  1.9   Liver Function Tests: Recent Labs  Lab 12/03/22 1346  AST 12*  ALT 14  ALKPHOS 99  BILITOT 0.3  PROT 6.4*   ALBUMIN 3.0*    CBC: Recent Labs  Lab 12/03/22 1346 12/04/22 0336 12/05/22 0356 12/06/22 0419 12/09/22 0432  WBC 11.6* 9.9 8.1 8.1 7.6  NEUTROABS 7.5  --   --   --   --   HGB 10.6* 9.7* 9.1* 9.4* 9.9*  HCT 34.9* 31.2* 30.1* 31.4* 33.1*  MCV 81.0 81.5 82.5 82.8 82.3  PLT 210 224 240 261 226      IMAGING STUDIES DG Chest 2 View  Result Date: 12/04/2022 CLINICAL DATA:  [id number] Acute respiratory failure with hypoxia (HCC) [id number] EXAM: CHEST - 2 VIEW COMPARISON:  10/25/2009. FINDINGS: Chronic interstitial pattern has slightly increased since the prior study. However, no overt pulmonary edema or pneumonia. Bilateral lung fields are clear. Bilateral costophrenic angles are clear. Note is made of elevated right hemidiaphragm. Normal cardio-mediastinal silhouette. No acute osseous abnormalities. The soft tissues are within normal limits. IMPRESSION: No acute cardiopulmonary process. Chronic interstitial pattern. Electronically Signed   By: Jules Schick M.D.   On: 12/04/2022 09:26   CT ABDOMEN PELVIS W CONTRAST  Result Date: 12/03/2022 CLINICAL DATA:  Left lower quadrant pain, nausea, and diarrhea for 3 days. EXAM: CT ABDOMEN AND PELVIS WITH CONTRAST TECHNIQUE: Multidetector CT imaging of the abdomen and pelvis was performed using the standard protocol following bolus administration of intravenous contrast. RADIATION DOSE REDUCTION: This exam was performed according to the departmental dose-optimization program which includes automated exposure control, adjustment of the mA and/or kV according to patient size and/or use of iterative reconstruction technique. CONTRAST:  OMNIPAQUE IOHEXOL 300 MG/ML  SOLN COMPARISON:  07/13/2019 FINDINGS: Lower Chest: No acute findings. Hepatobiliary: No suspicious hepatic masses identified. Prior cholecystectomy. No evidence of biliary obstruction. Pancreas:  No mass or inflammatory changes. Spleen: Within normal limits in size and appearance.  Adrenals/Urinary Tract: No suspicious masses identified. 2 mm right renal calculus noted. No evidence of ureteral calculi or hydronephrosis. Stomach/Bowel: Long segment colonic wall thickening is seen involving the majority of the descending and sigmoid colon, with mild pericolonic soft tissue stranding. This is consistent with moderate colitis. No No evidence of abscess or bowel obstruction. Normal appendix visualized. Vascular/Lymphatic: No pathologically enlarged lymph nodes. No acute vascular findings. Reproductive: Prior hysterectomy noted. Adnexal regions are unremarkable in appearance. Other:  None. Musculoskeletal:  No suspicious bone lesions identified.  IMPRESSION: Moderate colitis involving the descending and sigmoid colon. No evidence of abscess or bowel obstruction. Tiny nonobstructing right renal calculus. No evidence of ureteral calculi or hydronephrosis. Electronically Signed   By: Danae Orleans M.D.   On: 12/03/2022 16:08    DISCHARGE EXAMINATION: Vitals:   12/08/22 0531 12/08/22 1427 12/08/22 2043 12/09/22 0531  BP: (!) 140/64 (!) 141/61 128/70 (!) 135/53  Pulse: 65 80 81 68  Resp: 18  19 18   Temp: 97.8 F (36.6 C) 98.4 F (36.9 C) 99.2 F (37.3 C) 97.8 F (36.6 C)  TempSrc:  Oral    SpO2: 91% 92% 98% 93%  Weight:      Height:       General appearance: Awake alert.  In no distress Resp: Clear to auscultation bilaterally.  Normal effort Cardio: S1-S2 is normal regular.  No S3-S4.  No rubs murmurs or bruit GI: Abdomen is soft.  Nontender nondistended.  Bowel sounds are present normal.  No masses organomegaly   DISPOSITION: SNF  Discharge Instructions     Call MD for:  difficulty breathing, headache or visual disturbances   Complete by: As directed    Call MD for:  extreme fatigue   Complete by: As directed    Call MD for:  persistant dizziness or light-headedness   Complete by: As directed    Call MD for:  persistant nausea and vomiting   Complete by: As directed     Call MD for:  severe uncontrolled pain   Complete by: As directed    Call MD for:  temperature >100.4   Complete by: As directed    Discharge instructions   Complete by: As directed    Please review instructions on the discharge summary.  You were cared for by a hospitalist during your hospital stay. If you have any questions about your discharge medications or the care you received while you were in the hospital after you are discharged, you can call the unit and asked to speak with the hospitalist on call if the hospitalist that took care of you is not available. Once you are discharged, your primary care physician will handle any further medical issues. Please note that NO REFILLS for any discharge medications will be authorized once you are discharged, as it is imperative that you return to your primary care physician (or establish a relationship with a primary care physician if you do not have one) for your aftercare needs so that they can reassess your need for medications and monitor your lab values. If you do not have a primary care physician, you can call [phone] for a physician referral.   Increase activity slowly   Complete by: As directed          Allergies as of 12/09/2022   No Known Allergies      Medication List     STOP taking these medications    amoxicillin-clavulanate 500-125 MG tablet Commonly known as: AUGMENTIN   HYDROcodone-acetaminophen 5-325 MG tablet Commonly known as: NORCO/VICODIN   ibuprofen 800 MG tablet Commonly known as: ADVIL   solifenacin 10 MG tablet Commonly known as: VESICARE       TAKE these medications    amLODipine 5 MG tablet Commonly known as: NORVASC Take 1 tablet (5 mg total) by mouth daily. What changed: when to take this   buPROPion 150 MG 24 hr tablet Commonly known as: WELLBUTRIN XL TAKE 1 TABLET BY MOUTH EVERY DAY What changed: when to take this  cephALEXin 500 MG capsule Commonly known as: KEFLEX Take 1 capsule  (500 mg total) by mouth every 8 (eight) hours for 3 days.   DULoxetine 60 MG capsule Commonly known as: CYMBALTA Take 2 capsules (120 mg total) by mouth daily. What changed:  how much to take when to take this additional instructions   ferrous sulfate 325 (65 FE) MG EC tablet Take 1 tablet (325 mg total) by mouth 2 (two) times daily.   fluticasone 50 MCG/ACT nasal spray Commonly known as: FLONASE Place 2 sprays into both nostrils daily as needed for rhinitis.   furosemide 40 MG tablet Commonly known as: LASIX Take 0.5 tablets (20 mg total) by mouth daily. What changed:  how much to take when to take this reasons to take this   gabapentin 300 MG capsule Commonly known as: NEURONTIN Take 1 capsule (300 mg total) by mouth 3 (three) times daily.   losartan 100 MG tablet Commonly known as: COZAAR Take 1 tablet (100 mg total) by mouth daily.   pantoprazole 40 MG tablet Commonly known as: PROTONIX Take 1 tablet (40 mg total) by mouth daily. Start taking on: December 10, 2022   potassium chloride SA 20 MEQ tablet Commonly known as: KLOR-CON M Take 1 tablet (20 mEq total) by mouth daily. Start taking on: December 10, 2022   pravastatin 80 MG tablet Commonly known as: PRAVACHOL Take 1 tablet (80 mg total) by mouth daily.   Semaglutide (1 MG/DOSE) 4 MG/3ML Sopn Inject 1 mg as directed once a week.   senna-docusate 8.6-50 MG tablet Commonly known as: Senokot-S Take 1 tablet by mouth daily. Start taking on: December 10, 2022   tamsulosin 0.4 MG Caps capsule Commonly known as: FLOMAX Take 0.4 mg by mouth daily.   trospium 20 MG tablet Commonly known as: SANCTURA Take 20 mg by mouth 2 (two) times daily.          Contact information for follow-up providers     Dettinger, Elige Radon, MD. Schedule an appointment as soon as possible for a visit in 1 week(s).   Specialties: Family Medicine, Cardiology Why: post hospitalization follow up Contact information: [address] [place] [address] [phone]              Contact information for after-discharge care     Destination     [facility] Preferred SNF .   Service: Skilled Nursing Contact information: [address] [place] [address] [phone]                     TOTAL DISCHARGE TIME: 35 minutes   Rito Ehrlich  [facility] Pager on [email or web address]  12/09/2022, 11:17 AM

## 2022-12-09 NOTE — Plan of Care (Signed)
  Problem: Education: Goal: Knowledge of General Education information will improve Description Including pain rating scale, medication(s)/side effects and non-pharmacologic comfort measures Outcome: Progressing   Problem: Health Behavior/Discharge Planning: Goal: Ability to manage health-related needs will improve Outcome: Progressing   

## 2022-12-09 NOTE — Progress Notes (Signed)
Mobility Specialist Progress Note:    12/09/22 1050  Mobility  Activity Transferred from bed to chair  Level of Assistance Maximum assist, patient does 25-49%  Assistive Device Front wheel walker  Distance Ambulated (ft) 4 ft  Range of Motion/Exercises Active;All extremities  RLE Weight Bearing TWB  Activity Response Tolerated well  Mobility Referral Yes  $Mobility charge 1 Mobility  Mobility Specialist Start Time (ACUTE ONLY) 1050  Mobility Specialist Stop Time (ACUTE ONLY) 1100  Mobility Specialist Time Calculation (min) (ACUTE ONLY) 10 min   Pt was received in bed, agreeable to mobility session, family in room. Pt required MaxA to stand and to pivot to chair. Family member assisted with RW as +2. Tolerated well, pt had audible SOB. Left pt in chair, all needs met.   Lawerance Bach Mobility Specialist Please contact via Special educational needs teacher or  Rehab office at [phone]

## 2022-12-09 NOTE — TOC Progression Note (Signed)
Transition of Care [facility]) - Progression Note    Patient Details  Name: Tiffany Brown MRN: [id number] Date of Birth: [birth year]  [facility] [place] [facility] CM/SW Contact  Elliot Gault, LCSW Phone Number: 12/09/2022, 4:06 PM  Clinical Narrative:     TOC following. Have spoken with pt's insurance and reviewed insurance portal multiple times today. Per pt, her husband received a phone call from insurance yesterday stating that the approval for SNF was granted. Rep at the insurance says this may be accurate but that the system has not processed it yet.  Pt will remain hospitalized until the insurance portal indicates the result of the appeal.  Will follow up in AM.  Expected Discharge Plan: Skilled Nursing Facility Barriers to Discharge: Continued Medical Work up  Expected Discharge Plan and Services In-house Referral: Clinical Social Work Discharge Planning Services: CM Consult Post Acute Care Choice: Skilled Nursing Facility Living arrangements for the past 2 months: Single Family Home                                       Social Determinants of Health (SDOH) Interventions SDOH Screenings   Food Insecurity: No Food Insecurity (12/04/2022)  Housing: Low Risk  (12/04/2022)  Transportation Needs: No Transportation Needs (12/04/2022)  Utilities: Not At Risk (12/04/2022)  Alcohol Screen: Low Risk  (09/26/2022)  Depression (PHQ2-9): Low Risk  (09/26/2022)  Recent Concern: Depression (PHQ2-9) - High Risk (08/16/2022)  Financial Resource Strain: Low Risk  (09/26/2022)  Physical Activity: Insufficiently Active (09/26/2022)  Social Connections: Moderately Isolated (09/26/2022)  Stress: No Stress Concern Present (09/26/2022)  Tobacco Use: Medium Risk (12/03/2022)    Readmission Risk Interventions     No data to display

## 2022-12-10 DIAGNOSIS — I872 Venous insufficiency (chronic) (peripheral): Secondary | ICD-10-CM | POA: Diagnosis not present

## 2022-12-10 DIAGNOSIS — E785 Hyperlipidemia, unspecified: Secondary | ICD-10-CM | POA: Diagnosis not present

## 2022-12-10 DIAGNOSIS — M67874 Other specified disorders of tendon, left ankle and foot: Secondary | ICD-10-CM | POA: Diagnosis not present

## 2022-12-10 DIAGNOSIS — E876 Hypokalemia: Secondary | ICD-10-CM | POA: Diagnosis not present

## 2022-12-10 DIAGNOSIS — M797 Fibromyalgia: Secondary | ICD-10-CM | POA: Diagnosis not present

## 2022-12-10 DIAGNOSIS — E1159 Type 2 diabetes mellitus with other circulatory complications: Secondary | ICD-10-CM | POA: Diagnosis not present

## 2022-12-10 DIAGNOSIS — R6889 Other general symptoms and signs: Secondary | ICD-10-CM | POA: Diagnosis not present

## 2022-12-10 DIAGNOSIS — I10 Essential (primary) hypertension: Secondary | ICD-10-CM | POA: Diagnosis not present

## 2022-12-10 DIAGNOSIS — R5381 Other malaise: Secondary | ICD-10-CM | POA: Diagnosis not present

## 2022-12-10 DIAGNOSIS — E119 Type 2 diabetes mellitus without complications: Secondary | ICD-10-CM | POA: Diagnosis not present

## 2022-12-10 DIAGNOSIS — N3090 Cystitis, unspecified without hematuria: Secondary | ICD-10-CM | POA: Diagnosis not present

## 2022-12-10 DIAGNOSIS — M6281 Muscle weakness (generalized): Secondary | ICD-10-CM | POA: Diagnosis not present

## 2022-12-10 DIAGNOSIS — E1169 Type 2 diabetes mellitus with other specified complication: Secondary | ICD-10-CM | POA: Diagnosis not present

## 2022-12-10 DIAGNOSIS — Z7401 Bed confinement status: Secondary | ICD-10-CM | POA: Diagnosis not present

## 2022-12-10 DIAGNOSIS — Z981 Arthrodesis status: Secondary | ICD-10-CM | POA: Diagnosis not present

## 2022-12-10 DIAGNOSIS — K529 Noninfective gastroenteritis and colitis, unspecified: Secondary | ICD-10-CM | POA: Diagnosis not present

## 2022-12-10 DIAGNOSIS — D649 Anemia, unspecified: Secondary | ICD-10-CM | POA: Diagnosis not present

## 2022-12-10 DIAGNOSIS — M79671 Pain in right foot: Secondary | ICD-10-CM | POA: Diagnosis not present

## 2022-12-10 DIAGNOSIS — B351 Tinea unguium: Secondary | ICD-10-CM | POA: Diagnosis not present

## 2022-12-10 DIAGNOSIS — M5136 Other intervertebral disc degeneration, lumbar region: Secondary | ICD-10-CM | POA: Diagnosis not present

## 2022-12-10 DIAGNOSIS — M67873 Other specified disorders of tendon, right ankle and foot: Secondary | ICD-10-CM | POA: Diagnosis not present

## 2022-12-10 DIAGNOSIS — R262 Difficulty in walking, not elsewhere classified: Secondary | ICD-10-CM | POA: Diagnosis not present

## 2022-12-10 NOTE — Discharge Summary (Signed)
[facility]  Physician Discharge Summary   Patient ID: Tiffany Brown MRN: [id number] DOB/AGE: 1945/12/12 77 y.o.  Admit date: 12/03/2022 Discharge date:   12/10/2022   PCP: Dettinger, Elige Radon, MD  DISCHARGE DIAGNOSES:  Acute Colitis   Hypokalemia   Normocytic anemia   Cystitis   Ambulatory dysfunction   DM type 2 with diabetic dyslipidemia (HCC)   Essential hypertension, benign   Hyperlipidemia   Recurrent major depressive disorder, in full remission (HCC)   Fibromyalgia   RECOMMENDATIONS FOR OUTPATIENT FOLLOW UP: CBC and basic metabolic panel to be checked in 1 week Patient will need follow-up with gastroenterology in 4 to 6 weeks.     Home Health: Hopefully to SNF later today Equipment/Devices: None  CODE STATUS: Full code  DISCHARGE CONDITION: fair  Diet recommendation: Modified carbohydrate  INITIAL HISTORY: 77 y.o. female with a history of depression and anxiety, HTN, GERD, fibromyalgia, melanoma, HLD, prediabetes, right foot/ankle surgery who presented to the ED on 12/03/2022 with 3 days of nausea and watery diarrhea. She had been at rehab after foot surgery 5/7 - 7/22, recently discharged home, was treated for UTI approximately 3 weeks ago, and still noticed significant debility. In the ED she was afebrile with leukocytosis (WBC 11.6k), hypokalemic, nitrite-positive pyuria on UA, and CT abd/pelvis demonstrating moderate descending/sigmoid colitis. GI pathogen panel and C. diff assay negative. Ceftriaxone, flagyl, and IVF were given. Symptoms have resolved and she's now stable for discharge, but will require a SNF level of care. Coverage was declined by her insurance after peer-to-peer and expedited appeal is in process.    HOSPITAL COURSE:   Descending, sigmoid colitis Negative GI panel and C. diff, though has recent healthcare exposure and abx.  Patient's symptoms resolved.  She was placed on ceftriaxone and metronidazole.  Completed course of  metronidazole.  Will change to Keflex at discharge.   Recommend outpatient GI evaluation after at least 4-6 weeks.   E. coli UTI:  Status post treatment with ceftriaxone   Hypokalemia Secondary to GI loss.  Will be supplemented prior to discharge.  Will recommend low-dose daily potassium supplements.  Recheck labs at the skilled nursing facility.   Hypochromic anemia With ferritin normal (41), iron (14) and % sat (4%) are low.  Start iron supplements at discharge. - Positive FOBT: Recommend outpatient GI evaluation.  Looks like she is followed by [facility] physicians.  This can be facilitated by her primary care provider. Hemoglobin has been stable.   Essential hypertension:  - Continue losartan, norvasc. Blood pressure is reasonably well-controlled.   Right foot/ankle surgery 5/7, now w/gait impairment, debility:  Pain is reasonably well-controlled.  Orthopedics follow-up.  Surgery was performed by Dr. Susa Simmonds.   Depression:  - Continue wellbutrin, cymbalta   HLD:  - Continue statin   OAB:  Resume home medications.   Class III obesity Estimated body mass index is 47.09 kg/m as calculated from the following:   Height as of this encounter: 5\' 5"  (1.651 m).   Weight as of this encounter: 128.4 kg.   Patient is stable.  Waiting on SNF.  Okay for discharge when bed is available.   PERTINENT LABS:  The results of significant diagnostics from this hospitalization (including imaging, microbiology, ancillary and laboratory) are listed below for reference.    Microbiology: Recent Results (from the past 240 hour(s))  C Difficile Quick Screen w PCR reflex     Status: Abnormal   Collection Time: 12/03/22  5:04 PM   Specimen: In/Out Cath  Urine; Stool  Result Value Ref Range Status   C Diff antigen (A) NEGATIVE Final    INVALID, POSSIBLE SAMPLE INTEGRITY COMPROMISED, TEST WAS REPEATED    Comment: RESULT CALLED TO, READ BACK BY AND VERIFIED WITH: A.FLETCHER AT 1845 ON 07.30.24 BY  ADGER J     C Diff toxin (A) NEGATIVE Final    INVALID, POSSIBLE SAMPLE INTEGRITY COMPROMISED, TEST WAS REPEATED    Comment: RESULT CALLED TO, READ BACK BY AND VERIFIED WITH: A. FLETCHER AT 1845 ON 07.30.24 BY ADGER J     C Diff interpretation   Final    INVALID, POSSIBLE SAMPLE INTEGRITY COMPROMISED, TEST WAS REPEATED    Comment: RESULT CALLED TO, READ BACK BY AND VERIFIED WITH: A.FLETCHER AT 1845 ON 07.30.24 BY ADGER J  Performed at [facility], [address], [place], [place] [address]   Gastrointestinal Panel by PCR , Stool     Status: None   Collection Time: 12/03/22  5:04 PM   Specimen: In/Out Cath Urine; Stool  Result Value Ref Range Status   Campylobacter species NOT DETECTED NOT DETECTED Final   Plesimonas shigelloides NOT DETECTED NOT DETECTED Final   Salmonella species NOT DETECTED NOT DETECTED Final   Yersinia enterocolitica NOT DETECTED NOT DETECTED Final   Vibrio species NOT DETECTED NOT DETECTED Final   Vibrio cholerae NOT DETECTED NOT DETECTED Final   Enteroaggregative E coli (EAEC) NOT DETECTED NOT DETECTED Final   Enteropathogenic E coli (EPEC) NOT DETECTED NOT DETECTED Final   Enterotoxigenic E coli (ETEC) NOT DETECTED NOT DETECTED Final   Shiga like toxin producing E coli (STEC) NOT DETECTED NOT DETECTED Final   Shigella/Enteroinvasive E coli (EIEC) NOT DETECTED NOT DETECTED Final   Cryptosporidium NOT DETECTED NOT DETECTED Final   Cyclospora cayetanensis NOT DETECTED NOT DETECTED Final   Entamoeba histolytica NOT DETECTED NOT DETECTED Final   Giardia lamblia NOT DETECTED NOT DETECTED Final   Adenovirus F40/41 NOT DETECTED NOT DETECTED Final   Astrovirus NOT DETECTED NOT DETECTED Final   Norovirus GI/GII NOT DETECTED NOT DETECTED Final   Rotavirus A NOT DETECTED NOT DETECTED Final   Sapovirus (I, II, IV, and V) NOT DETECTED NOT DETECTED Final    Comment: Performed at [facility], [address] [place], [place] [address]  Urine Culture      Status: Abnormal   Collection Time: 12/03/22  5:04 PM   Specimen: Urine, Random  Result Value Ref Range Status   Specimen Description   Final    URINE, RANDOM Performed at [facility], [address], [place], [place] [address]    Special Requests   Final    NONE Reflexed from [id number] Performed at [facility], [address], [place], [place] [address]    Culture >=100,000 COLONIES/mL ESCHERICHIA COLI (A)  Final   Report Status 12/05/2022 FINAL  Final   Organism ID, Bacteria ESCHERICHIA COLI (A)  Final      Susceptibility   Escherichia coli - MIC*    AMPICILLIN 4 SENSITIVE Sensitive     CEFAZOLIN <=4 SENSITIVE Sensitive     CEFEPIME <=0.12 SENSITIVE Sensitive     CEFTRIAXONE <=0.25 SENSITIVE Sensitive     CIPROFLOXACIN <=0.25 SENSITIVE Sensitive     GENTAMICIN <=1 SENSITIVE Sensitive     IMIPENEM <=0.25 SENSITIVE Sensitive     NITROFURANTOIN <=16 SENSITIVE Sensitive     TRIMETH/SULFA <=20 SENSITIVE Sensitive     AMPICILLIN/SULBACTAM <=2 SENSITIVE Sensitive     PIP/TAZO <=4 SENSITIVE Sensitive     * >=  100,000 COLONIES/mL ESCHERICHIA COLI  C Difficile Quick Screen w PCR reflex     Status: None   Collection Time: 12/03/22  5:54 PM   Specimen: STOOL  Result Value Ref Range Status   C Diff antigen NEGATIVE NEGATIVE Final   C Diff toxin NEGATIVE NEGATIVE Final   C Diff interpretation No C. difficile detected.  Final    Comment: Performed at [facility], [address] [place], [place] [address]     Labs:   Basic Metabolic Panel: Recent Labs  Lab 12/03/22 1346 12/04/22 0336 12/05/22 0356 12/06/22 0419 12/07/22 0355 12/09/22 0432  NA 138 140 140 142 138 140  K 2.9* 3.1* 3.3* 3.7 3.5 3.2*  CL 105 107 105 107 101 105  CO2 25 25 25 27 28 28   GLUCOSE 98 103* 99 108* 108* 97  BUN 10 7* 8 7* 5* 10  CREATININE 0.59 0.57 0.61 0.61 0.57 0.66  CALCIUM 8.3* 8.2* 8.1* 8.4* 8.5* 8.4*  MG 1.9  --  1.9  --   --  1.9   Liver Function Tests: Recent Labs  Lab 12/03/22 1346  AST  12*  ALT 14  ALKPHOS 99  BILITOT 0.3  PROT 6.4*  ALBUMIN 3.0*    CBC: Recent Labs  Lab 12/03/22 1346 12/04/22 0336 12/05/22 0356 12/06/22 0419 12/09/22 0432  WBC 11.6* 9.9 8.1 8.1 7.6  NEUTROABS 7.5  --   --   --   --   HGB 10.6* 9.7* 9.1* 9.4* 9.9*  HCT 34.9* 31.2* 30.1* 31.4* 33.1*  MCV 81.0 81.5 82.5 82.8 82.3  PLT 210 224 240 261 226      IMAGING STUDIES DG Chest 2 View  Result Date: 12/04/2022 CLINICAL DATA:  [id number] Acute respiratory failure with hypoxia (HCC) [id number] EXAM: CHEST - 2 VIEW COMPARISON:  10/25/2009. FINDINGS: Chronic interstitial pattern has slightly increased since the prior study. However, no overt pulmonary edema or pneumonia. Bilateral lung fields are clear. Bilateral costophrenic angles are clear. Note is made of elevated right hemidiaphragm. Normal cardio-mediastinal silhouette. No acute osseous abnormalities. The soft tissues are within normal limits. IMPRESSION: No acute cardiopulmonary process. Chronic interstitial pattern. Electronically Signed   By: Jules Schick M.D.   On: 12/04/2022 09:26   CT ABDOMEN PELVIS W CONTRAST  Result Date: 12/03/2022 CLINICAL DATA:  Left lower quadrant pain, nausea, and diarrhea for 3 days. EXAM: CT ABDOMEN AND PELVIS WITH CONTRAST TECHNIQUE: Multidetector CT imaging of the abdomen and pelvis was performed using the standard protocol following bolus administration of intravenous contrast. RADIATION DOSE REDUCTION: This exam was performed according to the departmental dose-optimization program which includes automated exposure control, adjustment of the mA and/or kV according to patient size and/or use of iterative reconstruction technique. CONTRAST:  OMNIPAQUE IOHEXOL 300 MG/ML  SOLN COMPARISON:  07/13/2019 FINDINGS: Lower Chest: No acute findings. Hepatobiliary: No suspicious hepatic masses identified. Prior cholecystectomy. No evidence of biliary obstruction. Pancreas:  No mass or inflammatory changes. Spleen: Within  normal limits in size and appearance. Adrenals/Urinary Tract: No suspicious masses identified. 2 mm right renal calculus noted. No evidence of ureteral calculi or hydronephrosis. Stomach/Bowel: Long segment colonic wall thickening is seen involving the majority of the descending and sigmoid colon, with mild pericolonic soft tissue stranding. This is consistent with moderate colitis. No No evidence of abscess or bowel obstruction. Normal appendix visualized. Vascular/Lymphatic: No pathologically enlarged lymph nodes. No acute vascular findings. Reproductive: Prior hysterectomy noted. Adnexal regions are unremarkable in appearance. Other:  None.  Musculoskeletal:  No suspicious bone lesions identified. IMPRESSION: Moderate colitis involving the descending and sigmoid colon. No evidence of abscess or bowel obstruction. Tiny nonobstructing right renal calculus. No evidence of ureteral calculi or hydronephrosis. Electronically Signed   By: Danae Orleans M.D.   On: 12/03/2022 16:08    DISCHARGE EXAMINATION: Vitals:   12/09/22 1421 12/09/22 2052 12/09/22 2134 12/10/22 0458  BP: 136/62 120/85 (!) 135/50 (!) 146/57  Pulse: 84 80 74 73  Resp: 17 18 20 20   Temp: 98.7 F (37.1 C)  98.6 F (37 C) 98 F (36.7 C)  TempSrc:    Oral  SpO2: 95% 95% 100% 93%  Weight:      Height:       General appearance: Awake alert.  In no distress Resp: Clear to auscultation bilaterally.  Normal effort Cardio: S1-S2 is normal regular.  No S3-S4.  No rubs murmurs or bruit GI: Abdomen is soft.  Nontender nondistended.  Bowel sounds are present normal.  No masses organomegaly    DISPOSITION: SNF  Discharge Instructions     Call MD for:  difficulty breathing, headache or visual disturbances   Complete by: As directed    Call MD for:  extreme fatigue   Complete by: As directed    Call MD for:  persistant dizziness or light-headedness   Complete by: As directed    Call MD for:  persistant nausea and vomiting   Complete by:  As directed    Call MD for:  severe uncontrolled pain   Complete by: As directed    Call MD for:  temperature >100.4   Complete by: As directed    Discharge instructions   Complete by: As directed    Please review instructions on the discharge summary.  You were cared for by a hospitalist during your hospital stay. If you have any questions about your discharge medications or the care you received while you were in the hospital after you are discharged, you can call the unit and asked to speak with the hospitalist on call if the hospitalist that took care of you is not available. Once you are discharged, your primary care physician will handle any further medical issues. Please note that NO REFILLS for any discharge medications will be authorized once you are discharged, as it is imperative that you return to your primary care physician (or establish a relationship with a primary care physician if you do not have one) for your aftercare needs so that they can reassess your need for medications and monitor your lab values. If you do not have a primary care physician, you can call [phone] for a physician referral.   Increase activity slowly   Complete by: As directed          Allergies as of 12/10/2022   No Known Allergies      Medication List     STOP taking these medications    amoxicillin-clavulanate 500-125 MG tablet Commonly known as: AUGMENTIN   HYDROcodone-acetaminophen 5-325 MG tablet Commonly known as: NORCO/VICODIN   ibuprofen 800 MG tablet Commonly known as: ADVIL   solifenacin 10 MG tablet Commonly known as: VESICARE       TAKE these medications    amLODipine 5 MG tablet Commonly known as: NORVASC Take 1 tablet (5 mg total) by mouth daily. What changed: when to take this   buPROPion 150 MG 24 hr tablet Commonly known as: WELLBUTRIN XL TAKE 1 TABLET BY MOUTH EVERY DAY What changed: when to  take this   cephALEXin 500 MG capsule Commonly known as:  KEFLEX Take 1 capsule (500 mg total) by mouth every 8 (eight) hours for 3 days.   DULoxetine 60 MG capsule Commonly known as: CYMBALTA Take 2 capsules (120 mg total) by mouth daily. What changed:  how much to take when to take this additional instructions   ferrous sulfate 325 (65 FE) MG EC tablet Take 1 tablet (325 mg total) by mouth 2 (two) times daily.   fluticasone 50 MCG/ACT nasal spray Commonly known as: FLONASE Place 2 sprays into both nostrils daily as needed for rhinitis.   furosemide 40 MG tablet Commonly known as: LASIX Take 0.5 tablets (20 mg total) by mouth daily. What changed:  how much to take when to take this reasons to take this   gabapentin 300 MG capsule Commonly known as: NEURONTIN Take 1 capsule (300 mg total) by mouth 3 (three) times daily.   losartan 100 MG tablet Commonly known as: COZAAR Take 1 tablet (100 mg total) by mouth daily.   pantoprazole 40 MG tablet Commonly known as: PROTONIX Take 1 tablet (40 mg total) by mouth daily.   potassium chloride SA 20 MEQ tablet Commonly known as: KLOR-CON M Take 1 tablet (20 mEq total) by mouth daily.   pravastatin 80 MG tablet Commonly known as: PRAVACHOL Take 1 tablet (80 mg total) by mouth daily.   Semaglutide (1 MG/DOSE) 4 MG/3ML Sopn Inject 1 mg as directed once a week.   senna-docusate 8.6-50 MG tablet Commonly known as: Senokot-S Take 1 tablet by mouth daily.   tamsulosin 0.4 MG Caps capsule Commonly known as: FLOMAX Take 0.4 mg by mouth daily.   trospium 20 MG tablet Commonly known as: SANCTURA Take 20 mg by mouth 2 (two) times daily.          Contact information for follow-up providers     Dettinger, Elige Radon, MD. Schedule an appointment as soon as possible for a visit in 1 week(s).   Specialties: Family Medicine, Cardiology Why: post hospitalization follow up Contact information: [address] [place] [address] [phone]              Contact information  for after-discharge care     Destination     [facility]erred SNF .   Service: Skilled Nursing Contact information: [address] [place] [address] [phone]                     TOTAL DISCHARGE TIME: 35 minutes   Rito Ehrlich  [facility] Pager on [email or web address]  12/10/2022, 9:04 AM

## 2022-12-10 NOTE — Consult Note (Signed)
[facility] Customer service manager [facility]) Accountable Care Organization (ACO) [facility] Liaison Note  12/10/2022  VERONNICA BISBY June 15, 1945 [id number]  Location: [facility] Liaison screened the patient remotely at [facility].  Insurance: [employer]   Taylan Gingerich Geller is a 77 y.o. female who is a Optician, dispensing Care Patient of Dettinger, Elige Radon, MD [facility]. The patient was screened for readmission hospitalization with noted medium risk score for unplanned readmission risk with 2 IP in 6 months.  The patient was assessed for potential [facility]) Care Management service needs for post hospital transition for care coordination. Review of patient's electronic medical record reveals patient was admitted for Colitis. Pt discharged to STR for SNF level of care. Facility will provide pt with care management needs during his ongoing rehabilitation.   Plan: Discharge to SNF for rehabilitation today.   [facility] Care Management/Population Health does not replace or interfere with any arrangements made by the Inpatient Transition of Care team.   For questions contact:   Elliot Cousin, RN, [facility] Liaison [place]   Population Health Office Hours MTWF  8:00 am-6:00 pm Off on Thursday [phone] mobile [phone] [Office toll free line] Office Hours are M-F 8:30 - 5 pm 24 hour nurse advise line [phone] [email or web address]

## 2022-12-10 NOTE — TOC Transition Note (Signed)
Transition of Care [facility]) - CM/SW Discharge Note   Patient Details  Name: Tiffany Brown MRN: [id number] Date of Birth: February 09, 1946  Transition of Care [facility]) CM/SW Contact:  Elliot Gault, LCSW Phone Number: 12/10/2022, 9:23 AM   Clinical Narrative:     Pt's SNF auth appeal has been finalized. Insurance has authorized SNF rehab. MD states pt remains medically stable for dc.  Updated [facility] at [facility]. They can admit pt today.  Pt updated and she remains in agreement with the dc plan.  DC clinical sent electronically. RN to call report. EMS arranged.  No other TOC needs for dc.  Final next level of care: Skilled Nursing Facility Barriers to Discharge: Barriers Resolved   Patient Goals and CMS Choice CMS Medicare.gov Compare Post Acute Care list provided to:: Patient Choice offered to / list presented to : Patient, Spouse  Discharge Placement                Patient chooses bed at: [facility] Patient to be transferred to facility by: EMS Name of family member notified: Pt only, pt will notify family Patient and family notified of of transfer: 12/10/22  Discharge Plan and Services Additional resources added to the After Visit Summary for   In-house Referral: Clinical Social Work Discharge Planning Services: CM Consult Post Acute Care Choice: Skilled Nursing Facility                               Social Determinants of Health (SDOH) Interventions SDOH Screenings   Food Insecurity: No Food Insecurity (12/04/2022)  Housing: Low Risk  (12/04/2022)  Transportation Needs: No Transportation Needs (12/04/2022)  Utilities: Not At Risk (12/04/2022)  Alcohol Screen: Low Risk  (09/26/2022)  Depression (PHQ2-9): Low Risk  (09/26/2022)  Recent Concern: Depression (PHQ2-9) - High Risk (08/16/2022)  Financial Resource Strain: Low Risk  (09/26/2022)  Physical Activity: Insufficiently Active (09/26/2022)  Social Connections: Moderately Isolated (09/26/2022)  Stress: No  Stress Concern Present (09/26/2022)  Tobacco Use: Medium Risk (12/03/2022)     Readmission Risk Interventions     No data to display

## 2022-12-11 DIAGNOSIS — M79671 Pain in right foot: Secondary | ICD-10-CM | POA: Diagnosis not present

## 2022-12-11 DIAGNOSIS — Z981 Arthrodesis status: Secondary | ICD-10-CM | POA: Diagnosis not present

## 2022-12-11 DIAGNOSIS — E876 Hypokalemia: Secondary | ICD-10-CM | POA: Diagnosis not present

## 2022-12-11 DIAGNOSIS — E785 Hyperlipidemia, unspecified: Secondary | ICD-10-CM | POA: Diagnosis not present

## 2022-12-11 DIAGNOSIS — K529 Noninfective gastroenteritis and colitis, unspecified: Secondary | ICD-10-CM | POA: Diagnosis not present

## 2022-12-11 DIAGNOSIS — M797 Fibromyalgia: Secondary | ICD-10-CM | POA: Diagnosis not present

## 2022-12-11 DIAGNOSIS — D649 Anemia, unspecified: Secondary | ICD-10-CM | POA: Diagnosis not present

## 2022-12-11 DIAGNOSIS — R262 Difficulty in walking, not elsewhere classified: Secondary | ICD-10-CM | POA: Diagnosis not present

## 2022-12-11 DIAGNOSIS — N3090 Cystitis, unspecified without hematuria: Secondary | ICD-10-CM | POA: Diagnosis not present

## 2022-12-11 DIAGNOSIS — I10 Essential (primary) hypertension: Secondary | ICD-10-CM | POA: Diagnosis not present

## 2022-12-16 DIAGNOSIS — K529 Noninfective gastroenteritis and colitis, unspecified: Secondary | ICD-10-CM | POA: Diagnosis not present

## 2022-12-16 DIAGNOSIS — R5381 Other malaise: Secondary | ICD-10-CM | POA: Diagnosis not present

## 2022-12-17 DIAGNOSIS — R262 Difficulty in walking, not elsewhere classified: Secondary | ICD-10-CM | POA: Diagnosis not present

## 2022-12-17 DIAGNOSIS — E119 Type 2 diabetes mellitus without complications: Secondary | ICD-10-CM | POA: Diagnosis not present

## 2022-12-17 DIAGNOSIS — M797 Fibromyalgia: Secondary | ICD-10-CM | POA: Diagnosis not present

## 2022-12-17 DIAGNOSIS — R5381 Other malaise: Secondary | ICD-10-CM | POA: Diagnosis not present

## 2022-12-17 DIAGNOSIS — M79671 Pain in right foot: Secondary | ICD-10-CM | POA: Diagnosis not present

## 2022-12-18 LAB — LAB REPORT - SCANNED: EGFR: 90

## 2022-12-25 DIAGNOSIS — D649 Anemia, unspecified: Secondary | ICD-10-CM | POA: Diagnosis not present

## 2022-12-25 DIAGNOSIS — E785 Hyperlipidemia, unspecified: Secondary | ICD-10-CM | POA: Diagnosis not present

## 2022-12-25 DIAGNOSIS — E1169 Type 2 diabetes mellitus with other specified complication: Secondary | ICD-10-CM | POA: Diagnosis not present

## 2022-12-26 DIAGNOSIS — B351 Tinea unguium: Secondary | ICD-10-CM | POA: Diagnosis not present

## 2022-12-26 DIAGNOSIS — E1159 Type 2 diabetes mellitus with other circulatory complications: Secondary | ICD-10-CM | POA: Diagnosis not present

## 2022-12-30 ENCOUNTER — Telehealth: Payer: Self-pay

## 2022-12-30 NOTE — Telephone Encounter (Signed)
Patient informed we have received Ozempic and it will be placed in the refrigerator for pick up.

## 2023-01-03 DIAGNOSIS — I10 Essential (primary) hypertension: Secondary | ICD-10-CM | POA: Diagnosis not present

## 2023-01-03 DIAGNOSIS — M797 Fibromyalgia: Secondary | ICD-10-CM | POA: Diagnosis not present

## 2023-01-03 DIAGNOSIS — R5381 Other malaise: Secondary | ICD-10-CM | POA: Diagnosis not present

## 2023-01-03 DIAGNOSIS — D649 Anemia, unspecified: Secondary | ICD-10-CM | POA: Diagnosis not present

## 2023-01-03 DIAGNOSIS — R262 Difficulty in walking, not elsewhere classified: Secondary | ICD-10-CM | POA: Diagnosis not present

## 2023-01-03 DIAGNOSIS — E876 Hypokalemia: Secondary | ICD-10-CM | POA: Diagnosis not present

## 2023-01-03 DIAGNOSIS — Z981 Arthrodesis status: Secondary | ICD-10-CM | POA: Diagnosis not present

## 2023-01-03 DIAGNOSIS — E1169 Type 2 diabetes mellitus with other specified complication: Secondary | ICD-10-CM | POA: Diagnosis not present

## 2023-01-03 DIAGNOSIS — M79671 Pain in right foot: Secondary | ICD-10-CM | POA: Diagnosis not present

## 2023-01-03 DIAGNOSIS — K529 Noninfective gastroenteritis and colitis, unspecified: Secondary | ICD-10-CM | POA: Diagnosis not present

## 2023-01-03 DIAGNOSIS — E785 Hyperlipidemia, unspecified: Secondary | ICD-10-CM | POA: Diagnosis not present

## 2023-01-03 DIAGNOSIS — N3090 Cystitis, unspecified without hematuria: Secondary | ICD-10-CM | POA: Diagnosis not present

## 2023-01-05 DIAGNOSIS — M19071 Primary osteoarthritis, right ankle and foot: Secondary | ICD-10-CM | POA: Diagnosis not present

## 2023-01-05 DIAGNOSIS — Z981 Arthrodesis status: Secondary | ICD-10-CM | POA: Diagnosis not present

## 2023-01-05 DIAGNOSIS — Z604 Social exclusion and rejection: Secondary | ICD-10-CM | POA: Diagnosis not present

## 2023-01-05 DIAGNOSIS — M797 Fibromyalgia: Secondary | ICD-10-CM | POA: Diagnosis not present

## 2023-01-05 DIAGNOSIS — Z7985 Long-term (current) use of injectable non-insulin antidiabetic drugs: Secondary | ICD-10-CM | POA: Diagnosis not present

## 2023-01-05 DIAGNOSIS — I10 Essential (primary) hypertension: Secondary | ICD-10-CM | POA: Diagnosis not present

## 2023-01-05 DIAGNOSIS — Z96651 Presence of right artificial knee joint: Secondary | ICD-10-CM | POA: Diagnosis not present

## 2023-01-05 DIAGNOSIS — Z556 Problems related to health literacy: Secondary | ICD-10-CM | POA: Diagnosis not present

## 2023-01-05 DIAGNOSIS — Z4789 Encounter for other orthopedic aftercare: Secondary | ICD-10-CM | POA: Diagnosis not present

## 2023-01-05 DIAGNOSIS — Z993 Dependence on wheelchair: Secondary | ICD-10-CM | POA: Diagnosis not present

## 2023-01-08 DIAGNOSIS — M25571 Pain in right ankle and joints of right foot: Secondary | ICD-10-CM | POA: Diagnosis not present

## 2023-01-13 ENCOUNTER — Encounter: Payer: Self-pay | Admitting: Family Medicine

## 2023-01-13 ENCOUNTER — Ambulatory Visit (INDEPENDENT_AMBULATORY_CARE_PROVIDER_SITE_OTHER): Payer: Medicare Other | Admitting: Family Medicine

## 2023-01-13 VITALS — BP 135/71 | HR 76 | Ht 65.0 in | Wt 261.0 lb

## 2023-01-13 DIAGNOSIS — E876 Hypokalemia: Secondary | ICD-10-CM | POA: Diagnosis not present

## 2023-01-13 DIAGNOSIS — K529 Noninfective gastroenteritis and colitis, unspecified: Secondary | ICD-10-CM

## 2023-01-13 NOTE — Progress Notes (Signed)
BP 135/71   Pulse 76   Ht 5\' 5"  (1.651 m)   Wt 261 lb (118.4 kg)   SpO2 93%   BMI 43.43 kg/m    Subjective:   Patient ID: Tiffany Brown, female    DOB: 04-29-1946, 77 y.o.   MRN: [id number]  HPI: Tiffany Brown is a 77 y.o. female presenting on 01/13/2023 for Hospitalization Follow-up (Colitis, hypokalemia, HTN)   HPI Hospital/rehab follow-up Patient is coming in today for hospital/rehab follow-up.  She was admitted to the hospital on 12/03/2022 for cystitis and hypokalemia and acute colitis.  She was placed on ceftriaxone and metronidazole for colitis and changed to Keflex upon discharge.  She is slotted to have a GI follow-up after the hospitalization had it yet.  She is doing rehab for recent foot/ankle surgery on 09/10/2022.  Patient says her diarrhea is doing a lot better today.  She says she is having minimal abdominal pain as well as that is due to her bladder issues which she has had for some time.  They did start some medicines for cystitis or gave them to her but she has not started them yet but she will consider starting the 1 and trying it.  She also takes gabapentin to help with her bladder issues.  She says she still has urinary frequency and leakage at times.  She is still recovering from her ankle surgery and doing rehab and working with orthopedic.  Relevant past medical, surgical, family and social history reviewed and updated as indicated. Interim medical history since our last visit reviewed. Allergies and medications reviewed and updated.  Review of Systems  Constitutional:  Negative for chills and fever.  HENT:  Negative for congestion, ear discharge and ear pain.   Eyes:  Negative for redness and visual disturbance.  Respiratory:  Negative for chest tightness and shortness of breath.   Cardiovascular:  Negative for chest pain and leg swelling.  Gastrointestinal:  Positive for abdominal pain. Negative for constipation, diarrhea, nausea and vomiting.   Genitourinary:  Positive for dysuria, frequency and urgency. Negative for difficulty urinating and hematuria.  Musculoskeletal:  Negative for back pain and gait problem.  Skin:  Negative for rash.  Neurological:  Negative for light-headedness and headaches.  Psychiatric/Behavioral:  Negative for agitation and behavioral problems.   All other systems reviewed and are negative.   Per HPI unless specifically indicated above   Allergies as of 01/13/2023   No Known Allergies      Medication List        Accurate as of January 13, 2023 12:03 PM. If you have any questions, ask your nurse or doctor.          amLODipine 5 MG tablet Commonly known as: NORVASC Take 1 tablet (5 mg total) by mouth daily. What changed: when to take this   buPROPion 150 MG 24 hr tablet Commonly known as: WELLBUTRIN XL TAKE 1 TABLET BY MOUTH EVERY DAY What changed: when to take this   DULoxetine 60 MG capsule Commonly known as: CYMBALTA Take 2 capsules (120 mg total) by mouth daily. What changed:  how much to take when to take this additional instructions   ferrous sulfate 325 (65 FE) MG EC tablet Take 1 tablet (325 mg total) by mouth 2 (two) times daily.   fluticasone 50 MCG/ACT nasal spray Commonly known as: FLONASE Place 2 sprays into both nostrils daily as needed for rhinitis.   furosemide 40 MG tablet Commonly known as: LASIX Take  0.5 tablets (20 mg total) by mouth daily.   gabapentin 400 MG capsule Commonly known as: NEURONTIN Take 400 mg by mouth 3 (three) times daily. What changed: Another medication with the same name was removed. Continue taking this medication, and follow the directions you see here. Changed by: Elige Radon Roshaunda Starkey   losartan 100 MG tablet Commonly known as: COZAAR Take 1 tablet (100 mg total) by mouth daily.   pantoprazole 40 MG tablet Commonly known as: PROTONIX Take 1 tablet (40 mg total) by mouth daily.   potassium chloride SA 20 MEQ tablet Commonly  known as: KLOR-CON M Take 1 tablet (20 mEq total) by mouth daily.   pravastatin 80 MG tablet Commonly known as: PRAVACHOL Take 1 tablet (80 mg total) by mouth daily.   Semaglutide (1 MG/DOSE) 4 MG/3ML Sopn Inject 1 mg as directed once a week.   senna-docusate 8.6-50 MG tablet Commonly known as: Senokot-S Take 1 tablet by mouth daily.   tamsulosin 0.4 MG Caps capsule Commonly known as: FLOMAX Take 0.4 mg by mouth daily.   trospium 20 MG tablet Commonly known as: SANCTURA Take 20 mg by mouth 2 (two) times daily.         Objective:   BP 135/71   Pulse 76   Ht 5\' 5"  (1.651 m)   Wt 261 lb (118.4 kg)   SpO2 93%   BMI 43.43 kg/m   Wt Readings from Last 3 Encounters:  01/13/23 261 lb (118.4 kg)  12/03/22 283 lb (128.4 kg)  09/26/22 275 lb (124.7 kg)    Physical Exam Vitals and nursing note reviewed.  Constitutional:      General: She is not in acute distress.    Appearance: She is well-developed. She is not diaphoretic.  Eyes:     Conjunctiva/sclera: Conjunctivae normal.  Cardiovascular:     Rate and Rhythm: Normal rate and regular rhythm.     Heart sounds: Normal heart sounds. No murmur heard. Pulmonary:     Effort: Pulmonary effort is normal. No respiratory distress.     Breath sounds: Normal breath sounds. No wheezing.  Abdominal:     General: Abdomen is flat. Bowel sounds are normal. There is no distension.     Tenderness: There is no abdominal tenderness. There is no right CVA tenderness, left CVA tenderness, guarding or rebound.  Skin:    General: Skin is warm and dry.     Findings: No rash.  Neurological:     Mental Status: She is alert and oriented to person, place, and time.     Coordination: Coordination normal.  Psychiatric:        Behavior: Behavior normal.       Assessment & Plan:   Problem List Items Addressed This Visit       Digestive   Colitis - Primary   Relevant Orders   CBC with Differential/Platelet   CMP14+EGFR     Other    Hypokalemia   Relevant Orders   CBC with Differential/Platelet   CMP14+EGFR    Seems like colitis a lot better, will recheck her blood work again today.  Her anemia seems to have improved as well and so we will recheck that as well.  If she is still somewhat more anemic or going down then we will recommend that she take the iron.  If her potassium is lower than recommend that she take the potassium as well.  Will see where she is out today. Follow up plan: Return in about  2 months (around 03/15/2023), or if symptoms worsen or fail to improve, for Diabetes rech.  Counseling provided for all of the vaccine components Orders Placed This Encounter  Procedures   CBC with Differential/Platelet   CMP14+EGFR    Arville Care, MD [facility] 01/13/2023, 12:03 PM

## 2023-01-14 DIAGNOSIS — Z556 Problems related to health literacy: Secondary | ICD-10-CM | POA: Diagnosis not present

## 2023-01-14 DIAGNOSIS — Z4789 Encounter for other orthopedic aftercare: Secondary | ICD-10-CM | POA: Diagnosis not present

## 2023-01-14 DIAGNOSIS — I10 Essential (primary) hypertension: Secondary | ICD-10-CM | POA: Diagnosis not present

## 2023-01-14 DIAGNOSIS — M797 Fibromyalgia: Secondary | ICD-10-CM | POA: Diagnosis not present

## 2023-01-14 DIAGNOSIS — Z96651 Presence of right artificial knee joint: Secondary | ICD-10-CM | POA: Diagnosis not present

## 2023-01-14 DIAGNOSIS — Z993 Dependence on wheelchair: Secondary | ICD-10-CM | POA: Diagnosis not present

## 2023-01-14 DIAGNOSIS — Z981 Arthrodesis status: Secondary | ICD-10-CM | POA: Diagnosis not present

## 2023-01-14 DIAGNOSIS — Z7985 Long-term (current) use of injectable non-insulin antidiabetic drugs: Secondary | ICD-10-CM | POA: Diagnosis not present

## 2023-01-14 DIAGNOSIS — M19071 Primary osteoarthritis, right ankle and foot: Secondary | ICD-10-CM | POA: Diagnosis not present

## 2023-01-14 DIAGNOSIS — Z604 Social exclusion and rejection: Secondary | ICD-10-CM | POA: Diagnosis not present

## 2023-01-14 LAB — CBC WITH DIFFERENTIAL/PLATELET
Basophils Absolute: 0 10*3/uL (ref 0.0–0.2)
Basos: 0 %
EOS (ABSOLUTE): 0.2 10*3/uL (ref 0.0–0.4)
Eos: 2 %
Hematocrit: 38 % (ref 34.0–46.6)
Hemoglobin: 11.9 g/dL (ref 11.1–15.9)
Immature Grans (Abs): 0 10*3/uL (ref 0.0–0.1)
Immature Granulocytes: 0 %
Lymphocytes Absolute: 2.6 10*3/uL (ref 0.7–3.1)
Lymphs: 31 %
MCH: 26.4 pg — ABNORMAL LOW (ref 26.6–33.0)
MCHC: 31.3 g/dL — ABNORMAL LOW (ref 31.5–35.7)
MCV: 84 fL (ref 79–97)
Monocytes Absolute: 0.4 10*3/uL (ref 0.1–0.9)
Monocytes: 5 %
Neutrophils Absolute: 5.1 10*3/uL (ref 1.4–7.0)
Neutrophils: 62 %
Platelets: 216 10*3/uL (ref 150–450)
RBC: 4.5 x10E6/uL (ref 3.77–5.28)
RDW: 21.6 % — ABNORMAL HIGH (ref 11.7–15.4)
WBC: 8.4 10*3/uL (ref 3.4–10.8)

## 2023-01-14 LAB — CMP14+EGFR
ALT: 22 IU/L (ref 0–32)
AST: 28 IU/L (ref 0–40)
Albumin: 3.9 g/dL (ref 3.8–4.8)
Alkaline Phosphatase: 143 IU/L — ABNORMAL HIGH (ref 44–121)
BUN/Creatinine Ratio: 32 — ABNORMAL HIGH (ref 12–28)
BUN: 19 mg/dL (ref 8–27)
Bilirubin Total: 0.2 mg/dL (ref 0.0–1.2)
CO2: 26 mmol/L (ref 20–29)
Calcium: 9.2 mg/dL (ref 8.7–10.3)
Chloride: 100 mmol/L (ref 96–106)
Creatinine, Ser: 0.59 mg/dL (ref 0.57–1.00)
Globulin, Total: 2.2 g/dL (ref 1.5–4.5)
Glucose: 119 mg/dL — ABNORMAL HIGH (ref 70–99)
Potassium: 4.4 mmol/L (ref 3.5–5.2)
Sodium: 141 mmol/L (ref 134–144)
Total Protein: 6.1 g/dL (ref 6.0–8.5)
eGFR: 92 mL/min/{1.73_m2} (ref >59)

## 2023-01-15 DIAGNOSIS — M797 Fibromyalgia: Secondary | ICD-10-CM | POA: Diagnosis not present

## 2023-01-15 DIAGNOSIS — Z556 Problems related to health literacy: Secondary | ICD-10-CM | POA: Diagnosis not present

## 2023-01-15 DIAGNOSIS — M19071 Primary osteoarthritis, right ankle and foot: Secondary | ICD-10-CM | POA: Diagnosis not present

## 2023-01-15 DIAGNOSIS — I10 Essential (primary) hypertension: Secondary | ICD-10-CM | POA: Diagnosis not present

## 2023-01-15 DIAGNOSIS — Z4789 Encounter for other orthopedic aftercare: Secondary | ICD-10-CM | POA: Diagnosis not present

## 2023-01-15 DIAGNOSIS — Z7985 Long-term (current) use of injectable non-insulin antidiabetic drugs: Secondary | ICD-10-CM | POA: Diagnosis not present

## 2023-01-15 DIAGNOSIS — Z96651 Presence of right artificial knee joint: Secondary | ICD-10-CM | POA: Diagnosis not present

## 2023-01-15 DIAGNOSIS — Z993 Dependence on wheelchair: Secondary | ICD-10-CM | POA: Diagnosis not present

## 2023-01-15 DIAGNOSIS — Z604 Social exclusion and rejection: Secondary | ICD-10-CM | POA: Diagnosis not present

## 2023-01-15 DIAGNOSIS — Z981 Arthrodesis status: Secondary | ICD-10-CM | POA: Diagnosis not present

## 2023-01-17 DIAGNOSIS — Z7985 Long-term (current) use of injectable non-insulin antidiabetic drugs: Secondary | ICD-10-CM | POA: Diagnosis not present

## 2023-01-17 DIAGNOSIS — Z4789 Encounter for other orthopedic aftercare: Secondary | ICD-10-CM | POA: Diagnosis not present

## 2023-01-17 DIAGNOSIS — I10 Essential (primary) hypertension: Secondary | ICD-10-CM | POA: Diagnosis not present

## 2023-01-17 DIAGNOSIS — M797 Fibromyalgia: Secondary | ICD-10-CM | POA: Diagnosis not present

## 2023-01-17 DIAGNOSIS — M19071 Primary osteoarthritis, right ankle and foot: Secondary | ICD-10-CM | POA: Diagnosis not present

## 2023-01-17 DIAGNOSIS — Z96651 Presence of right artificial knee joint: Secondary | ICD-10-CM | POA: Diagnosis not present

## 2023-01-17 DIAGNOSIS — Z981 Arthrodesis status: Secondary | ICD-10-CM | POA: Diagnosis not present

## 2023-01-17 DIAGNOSIS — Z556 Problems related to health literacy: Secondary | ICD-10-CM | POA: Diagnosis not present

## 2023-01-17 DIAGNOSIS — Z993 Dependence on wheelchair: Secondary | ICD-10-CM | POA: Diagnosis not present

## 2023-01-17 DIAGNOSIS — Z604 Social exclusion and rejection: Secondary | ICD-10-CM | POA: Diagnosis not present

## 2023-01-20 DIAGNOSIS — M797 Fibromyalgia: Secondary | ICD-10-CM | POA: Diagnosis not present

## 2023-01-20 DIAGNOSIS — Z4789 Encounter for other orthopedic aftercare: Secondary | ICD-10-CM | POA: Diagnosis not present

## 2023-01-20 DIAGNOSIS — Z96651 Presence of right artificial knee joint: Secondary | ICD-10-CM | POA: Diagnosis not present

## 2023-01-20 DIAGNOSIS — Z556 Problems related to health literacy: Secondary | ICD-10-CM | POA: Diagnosis not present

## 2023-01-20 DIAGNOSIS — Z604 Social exclusion and rejection: Secondary | ICD-10-CM | POA: Diagnosis not present

## 2023-01-20 DIAGNOSIS — M19071 Primary osteoarthritis, right ankle and foot: Secondary | ICD-10-CM | POA: Diagnosis not present

## 2023-01-20 DIAGNOSIS — Z981 Arthrodesis status: Secondary | ICD-10-CM | POA: Diagnosis not present

## 2023-01-20 DIAGNOSIS — Z7985 Long-term (current) use of injectable non-insulin antidiabetic drugs: Secondary | ICD-10-CM | POA: Diagnosis not present

## 2023-01-20 DIAGNOSIS — Z993 Dependence on wheelchair: Secondary | ICD-10-CM | POA: Diagnosis not present

## 2023-01-20 DIAGNOSIS — I10 Essential (primary) hypertension: Secondary | ICD-10-CM | POA: Diagnosis not present

## 2023-01-22 DIAGNOSIS — Z4789 Encounter for other orthopedic aftercare: Secondary | ICD-10-CM | POA: Diagnosis not present

## 2023-01-22 DIAGNOSIS — Z993 Dependence on wheelchair: Secondary | ICD-10-CM | POA: Diagnosis not present

## 2023-01-22 DIAGNOSIS — M797 Fibromyalgia: Secondary | ICD-10-CM | POA: Diagnosis not present

## 2023-01-22 DIAGNOSIS — Z604 Social exclusion and rejection: Secondary | ICD-10-CM | POA: Diagnosis not present

## 2023-01-22 DIAGNOSIS — Z981 Arthrodesis status: Secondary | ICD-10-CM | POA: Diagnosis not present

## 2023-01-22 DIAGNOSIS — Z96651 Presence of right artificial knee joint: Secondary | ICD-10-CM | POA: Diagnosis not present

## 2023-01-22 DIAGNOSIS — I10 Essential (primary) hypertension: Secondary | ICD-10-CM | POA: Diagnosis not present

## 2023-01-22 DIAGNOSIS — M19071 Primary osteoarthritis, right ankle and foot: Secondary | ICD-10-CM | POA: Diagnosis not present

## 2023-01-22 DIAGNOSIS — Z7985 Long-term (current) use of injectable non-insulin antidiabetic drugs: Secondary | ICD-10-CM | POA: Diagnosis not present

## 2023-01-22 DIAGNOSIS — Z556 Problems related to health literacy: Secondary | ICD-10-CM | POA: Diagnosis not present

## 2023-01-24 DIAGNOSIS — Z4789 Encounter for other orthopedic aftercare: Secondary | ICD-10-CM | POA: Diagnosis not present

## 2023-01-24 DIAGNOSIS — M19071 Primary osteoarthritis, right ankle and foot: Secondary | ICD-10-CM | POA: Diagnosis not present

## 2023-01-24 DIAGNOSIS — Z604 Social exclusion and rejection: Secondary | ICD-10-CM | POA: Diagnosis not present

## 2023-01-24 DIAGNOSIS — Z96651 Presence of right artificial knee joint: Secondary | ICD-10-CM | POA: Diagnosis not present

## 2023-01-24 DIAGNOSIS — Z993 Dependence on wheelchair: Secondary | ICD-10-CM | POA: Diagnosis not present

## 2023-01-24 DIAGNOSIS — Z7985 Long-term (current) use of injectable non-insulin antidiabetic drugs: Secondary | ICD-10-CM | POA: Diagnosis not present

## 2023-01-24 DIAGNOSIS — Z556 Problems related to health literacy: Secondary | ICD-10-CM | POA: Diagnosis not present

## 2023-01-24 DIAGNOSIS — M797 Fibromyalgia: Secondary | ICD-10-CM | POA: Diagnosis not present

## 2023-01-24 DIAGNOSIS — Z981 Arthrodesis status: Secondary | ICD-10-CM | POA: Diagnosis not present

## 2023-01-24 DIAGNOSIS — I10 Essential (primary) hypertension: Secondary | ICD-10-CM | POA: Diagnosis not present

## 2023-01-28 DIAGNOSIS — M797 Fibromyalgia: Secondary | ICD-10-CM | POA: Diagnosis not present

## 2023-01-28 DIAGNOSIS — Z87891 Personal history of nicotine dependence: Secondary | ICD-10-CM | POA: Diagnosis not present

## 2023-01-28 DIAGNOSIS — N3090 Cystitis, unspecified without hematuria: Secondary | ICD-10-CM | POA: Diagnosis not present

## 2023-01-28 DIAGNOSIS — I10 Essential (primary) hypertension: Secondary | ICD-10-CM | POA: Diagnosis not present

## 2023-01-28 DIAGNOSIS — E1169 Type 2 diabetes mellitus with other specified complication: Secondary | ICD-10-CM | POA: Diagnosis not present

## 2023-01-28 DIAGNOSIS — I890 Lymphedema, not elsewhere classified: Secondary | ICD-10-CM | POA: Diagnosis not present

## 2023-01-28 DIAGNOSIS — K529 Noninfective gastroenteritis and colitis, unspecified: Secondary | ICD-10-CM | POA: Diagnosis not present

## 2023-01-28 DIAGNOSIS — K5900 Constipation, unspecified: Secondary | ICD-10-CM | POA: Diagnosis not present

## 2023-01-28 DIAGNOSIS — K219 Gastro-esophageal reflux disease without esophagitis: Secondary | ICD-10-CM | POA: Diagnosis not present

## 2023-01-28 DIAGNOSIS — Z87440 Personal history of urinary (tract) infections: Secondary | ICD-10-CM | POA: Diagnosis not present

## 2023-01-28 DIAGNOSIS — D649 Anemia, unspecified: Secondary | ICD-10-CM | POA: Diagnosis not present

## 2023-01-28 DIAGNOSIS — M19071 Primary osteoarthritis, right ankle and foot: Secondary | ICD-10-CM | POA: Diagnosis not present

## 2023-01-28 DIAGNOSIS — Z96651 Presence of right artificial knee joint: Secondary | ICD-10-CM | POA: Diagnosis not present

## 2023-01-28 DIAGNOSIS — Z4789 Encounter for other orthopedic aftercare: Secondary | ICD-10-CM | POA: Diagnosis not present

## 2023-01-28 DIAGNOSIS — Z7985 Long-term (current) use of injectable non-insulin antidiabetic drugs: Secondary | ICD-10-CM | POA: Diagnosis not present

## 2023-01-28 DIAGNOSIS — E7849 Other hyperlipidemia: Secondary | ICD-10-CM | POA: Diagnosis not present

## 2023-01-28 DIAGNOSIS — Z85828 Personal history of other malignant neoplasm of skin: Secondary | ICD-10-CM | POA: Diagnosis not present

## 2023-01-28 DIAGNOSIS — Z993 Dependence on wheelchair: Secondary | ICD-10-CM | POA: Diagnosis not present

## 2023-01-28 DIAGNOSIS — Z981 Arthrodesis status: Secondary | ICD-10-CM | POA: Diagnosis not present

## 2023-01-28 DIAGNOSIS — Z85820 Personal history of malignant melanoma of skin: Secondary | ICD-10-CM | POA: Diagnosis not present

## 2023-01-28 DIAGNOSIS — E876 Hypokalemia: Secondary | ICD-10-CM | POA: Diagnosis not present

## 2023-01-30 DIAGNOSIS — I10 Essential (primary) hypertension: Secondary | ICD-10-CM | POA: Diagnosis not present

## 2023-01-30 DIAGNOSIS — Z4789 Encounter for other orthopedic aftercare: Secondary | ICD-10-CM | POA: Diagnosis not present

## 2023-01-30 DIAGNOSIS — D649 Anemia, unspecified: Secondary | ICD-10-CM | POA: Diagnosis not present

## 2023-01-30 DIAGNOSIS — Z96651 Presence of right artificial knee joint: Secondary | ICD-10-CM | POA: Diagnosis not present

## 2023-01-30 DIAGNOSIS — K529 Noninfective gastroenteritis and colitis, unspecified: Secondary | ICD-10-CM | POA: Diagnosis not present

## 2023-01-30 DIAGNOSIS — K5900 Constipation, unspecified: Secondary | ICD-10-CM | POA: Diagnosis not present

## 2023-01-30 DIAGNOSIS — Z7985 Long-term (current) use of injectable non-insulin antidiabetic drugs: Secondary | ICD-10-CM | POA: Diagnosis not present

## 2023-01-30 DIAGNOSIS — K219 Gastro-esophageal reflux disease without esophagitis: Secondary | ICD-10-CM | POA: Diagnosis not present

## 2023-01-30 DIAGNOSIS — N3090 Cystitis, unspecified without hematuria: Secondary | ICD-10-CM | POA: Diagnosis not present

## 2023-01-30 DIAGNOSIS — Z993 Dependence on wheelchair: Secondary | ICD-10-CM | POA: Diagnosis not present

## 2023-01-30 DIAGNOSIS — E1169 Type 2 diabetes mellitus with other specified complication: Secondary | ICD-10-CM | POA: Diagnosis not present

## 2023-01-30 DIAGNOSIS — Z87891 Personal history of nicotine dependence: Secondary | ICD-10-CM | POA: Diagnosis not present

## 2023-01-30 DIAGNOSIS — I890 Lymphedema, not elsewhere classified: Secondary | ICD-10-CM | POA: Diagnosis not present

## 2023-01-30 DIAGNOSIS — M797 Fibromyalgia: Secondary | ICD-10-CM | POA: Diagnosis not present

## 2023-01-30 DIAGNOSIS — E7849 Other hyperlipidemia: Secondary | ICD-10-CM | POA: Diagnosis not present

## 2023-01-30 DIAGNOSIS — Z981 Arthrodesis status: Secondary | ICD-10-CM | POA: Diagnosis not present

## 2023-01-30 DIAGNOSIS — M19071 Primary osteoarthritis, right ankle and foot: Secondary | ICD-10-CM | POA: Diagnosis not present

## 2023-01-30 DIAGNOSIS — E876 Hypokalemia: Secondary | ICD-10-CM | POA: Diagnosis not present

## 2023-01-30 DIAGNOSIS — Z85828 Personal history of other malignant neoplasm of skin: Secondary | ICD-10-CM | POA: Diagnosis not present

## 2023-01-30 DIAGNOSIS — Z85820 Personal history of malignant melanoma of skin: Secondary | ICD-10-CM | POA: Diagnosis not present

## 2023-01-30 DIAGNOSIS — Z87440 Personal history of urinary (tract) infections: Secondary | ICD-10-CM | POA: Diagnosis not present

## 2023-02-03 DIAGNOSIS — E1169 Type 2 diabetes mellitus with other specified complication: Secondary | ICD-10-CM | POA: Diagnosis not present

## 2023-02-03 DIAGNOSIS — Z7985 Long-term (current) use of injectable non-insulin antidiabetic drugs: Secondary | ICD-10-CM | POA: Diagnosis not present

## 2023-02-03 DIAGNOSIS — E876 Hypokalemia: Secondary | ICD-10-CM | POA: Diagnosis not present

## 2023-02-03 DIAGNOSIS — Z85828 Personal history of other malignant neoplasm of skin: Secondary | ICD-10-CM | POA: Diagnosis not present

## 2023-02-03 DIAGNOSIS — K529 Noninfective gastroenteritis and colitis, unspecified: Secondary | ICD-10-CM | POA: Diagnosis not present

## 2023-02-03 DIAGNOSIS — Z96651 Presence of right artificial knee joint: Secondary | ICD-10-CM | POA: Diagnosis not present

## 2023-02-03 DIAGNOSIS — E7849 Other hyperlipidemia: Secondary | ICD-10-CM | POA: Diagnosis not present

## 2023-02-03 DIAGNOSIS — K219 Gastro-esophageal reflux disease without esophagitis: Secondary | ICD-10-CM | POA: Diagnosis not present

## 2023-02-03 DIAGNOSIS — Z981 Arthrodesis status: Secondary | ICD-10-CM | POA: Diagnosis not present

## 2023-02-03 DIAGNOSIS — I10 Essential (primary) hypertension: Secondary | ICD-10-CM | POA: Diagnosis not present

## 2023-02-03 DIAGNOSIS — Z4789 Encounter for other orthopedic aftercare: Secondary | ICD-10-CM | POA: Diagnosis not present

## 2023-02-03 DIAGNOSIS — I890 Lymphedema, not elsewhere classified: Secondary | ICD-10-CM | POA: Diagnosis not present

## 2023-02-03 DIAGNOSIS — N3090 Cystitis, unspecified without hematuria: Secondary | ICD-10-CM | POA: Diagnosis not present

## 2023-02-03 DIAGNOSIS — Z87891 Personal history of nicotine dependence: Secondary | ICD-10-CM | POA: Diagnosis not present

## 2023-02-03 DIAGNOSIS — D649 Anemia, unspecified: Secondary | ICD-10-CM | POA: Diagnosis not present

## 2023-02-03 DIAGNOSIS — Z993 Dependence on wheelchair: Secondary | ICD-10-CM | POA: Diagnosis not present

## 2023-02-03 DIAGNOSIS — K5900 Constipation, unspecified: Secondary | ICD-10-CM | POA: Diagnosis not present

## 2023-02-03 DIAGNOSIS — Z87440 Personal history of urinary (tract) infections: Secondary | ICD-10-CM | POA: Diagnosis not present

## 2023-02-03 DIAGNOSIS — Z85820 Personal history of malignant melanoma of skin: Secondary | ICD-10-CM | POA: Diagnosis not present

## 2023-02-03 DIAGNOSIS — M19071 Primary osteoarthritis, right ankle and foot: Secondary | ICD-10-CM | POA: Diagnosis not present

## 2023-02-03 DIAGNOSIS — M797 Fibromyalgia: Secondary | ICD-10-CM | POA: Diagnosis not present

## 2023-02-05 DIAGNOSIS — Z87440 Personal history of urinary (tract) infections: Secondary | ICD-10-CM | POA: Diagnosis not present

## 2023-02-05 DIAGNOSIS — E876 Hypokalemia: Secondary | ICD-10-CM | POA: Diagnosis not present

## 2023-02-05 DIAGNOSIS — N3090 Cystitis, unspecified without hematuria: Secondary | ICD-10-CM | POA: Diagnosis not present

## 2023-02-05 DIAGNOSIS — Z85828 Personal history of other malignant neoplasm of skin: Secondary | ICD-10-CM | POA: Diagnosis not present

## 2023-02-05 DIAGNOSIS — I890 Lymphedema, not elsewhere classified: Secondary | ICD-10-CM | POA: Diagnosis not present

## 2023-02-05 DIAGNOSIS — Z7985 Long-term (current) use of injectable non-insulin antidiabetic drugs: Secondary | ICD-10-CM | POA: Diagnosis not present

## 2023-02-05 DIAGNOSIS — Z993 Dependence on wheelchair: Secondary | ICD-10-CM | POA: Diagnosis not present

## 2023-02-05 DIAGNOSIS — Z981 Arthrodesis status: Secondary | ICD-10-CM | POA: Diagnosis not present

## 2023-02-05 DIAGNOSIS — Z4789 Encounter for other orthopedic aftercare: Secondary | ICD-10-CM | POA: Diagnosis not present

## 2023-02-05 DIAGNOSIS — K219 Gastro-esophageal reflux disease without esophagitis: Secondary | ICD-10-CM | POA: Diagnosis not present

## 2023-02-05 DIAGNOSIS — K529 Noninfective gastroenteritis and colitis, unspecified: Secondary | ICD-10-CM | POA: Diagnosis not present

## 2023-02-05 DIAGNOSIS — M797 Fibromyalgia: Secondary | ICD-10-CM | POA: Diagnosis not present

## 2023-02-05 DIAGNOSIS — E7849 Other hyperlipidemia: Secondary | ICD-10-CM | POA: Diagnosis not present

## 2023-02-05 DIAGNOSIS — D649 Anemia, unspecified: Secondary | ICD-10-CM | POA: Diagnosis not present

## 2023-02-05 DIAGNOSIS — E1169 Type 2 diabetes mellitus with other specified complication: Secondary | ICD-10-CM | POA: Diagnosis not present

## 2023-02-05 DIAGNOSIS — M19071 Primary osteoarthritis, right ankle and foot: Secondary | ICD-10-CM | POA: Diagnosis not present

## 2023-02-05 DIAGNOSIS — Z96651 Presence of right artificial knee joint: Secondary | ICD-10-CM | POA: Diagnosis not present

## 2023-02-05 DIAGNOSIS — Z85820 Personal history of malignant melanoma of skin: Secondary | ICD-10-CM | POA: Diagnosis not present

## 2023-02-05 DIAGNOSIS — Z87891 Personal history of nicotine dependence: Secondary | ICD-10-CM | POA: Diagnosis not present

## 2023-02-05 DIAGNOSIS — I10 Essential (primary) hypertension: Secondary | ICD-10-CM | POA: Diagnosis not present

## 2023-02-05 DIAGNOSIS — K5900 Constipation, unspecified: Secondary | ICD-10-CM | POA: Diagnosis not present

## 2023-02-10 DIAGNOSIS — Z96651 Presence of right artificial knee joint: Secondary | ICD-10-CM | POA: Diagnosis not present

## 2023-02-10 DIAGNOSIS — E7849 Other hyperlipidemia: Secondary | ICD-10-CM | POA: Diagnosis not present

## 2023-02-10 DIAGNOSIS — K219 Gastro-esophageal reflux disease without esophagitis: Secondary | ICD-10-CM | POA: Diagnosis not present

## 2023-02-10 DIAGNOSIS — Z85828 Personal history of other malignant neoplasm of skin: Secondary | ICD-10-CM | POA: Diagnosis not present

## 2023-02-10 DIAGNOSIS — M797 Fibromyalgia: Secondary | ICD-10-CM | POA: Diagnosis not present

## 2023-02-10 DIAGNOSIS — I890 Lymphedema, not elsewhere classified: Secondary | ICD-10-CM | POA: Diagnosis not present

## 2023-02-10 DIAGNOSIS — Z87440 Personal history of urinary (tract) infections: Secondary | ICD-10-CM | POA: Diagnosis not present

## 2023-02-10 DIAGNOSIS — D649 Anemia, unspecified: Secondary | ICD-10-CM | POA: Diagnosis not present

## 2023-02-10 DIAGNOSIS — Z4789 Encounter for other orthopedic aftercare: Secondary | ICD-10-CM | POA: Diagnosis not present

## 2023-02-10 DIAGNOSIS — E1169 Type 2 diabetes mellitus with other specified complication: Secondary | ICD-10-CM | POA: Diagnosis not present

## 2023-02-10 DIAGNOSIS — N3090 Cystitis, unspecified without hematuria: Secondary | ICD-10-CM | POA: Diagnosis not present

## 2023-02-10 DIAGNOSIS — K529 Noninfective gastroenteritis and colitis, unspecified: Secondary | ICD-10-CM | POA: Diagnosis not present

## 2023-02-10 DIAGNOSIS — Z7985 Long-term (current) use of injectable non-insulin antidiabetic drugs: Secondary | ICD-10-CM | POA: Diagnosis not present

## 2023-02-10 DIAGNOSIS — Z85820 Personal history of malignant melanoma of skin: Secondary | ICD-10-CM | POA: Diagnosis not present

## 2023-02-10 DIAGNOSIS — Z87891 Personal history of nicotine dependence: Secondary | ICD-10-CM | POA: Diagnosis not present

## 2023-02-10 DIAGNOSIS — Z981 Arthrodesis status: Secondary | ICD-10-CM | POA: Diagnosis not present

## 2023-02-10 DIAGNOSIS — M19071 Primary osteoarthritis, right ankle and foot: Secondary | ICD-10-CM | POA: Diagnosis not present

## 2023-02-10 DIAGNOSIS — I10 Essential (primary) hypertension: Secondary | ICD-10-CM | POA: Diagnosis not present

## 2023-02-10 DIAGNOSIS — K5900 Constipation, unspecified: Secondary | ICD-10-CM | POA: Diagnosis not present

## 2023-02-10 DIAGNOSIS — E876 Hypokalemia: Secondary | ICD-10-CM | POA: Diagnosis not present

## 2023-02-10 DIAGNOSIS — Z993 Dependence on wheelchair: Secondary | ICD-10-CM | POA: Diagnosis not present

## 2023-02-11 ENCOUNTER — Other Ambulatory Visit: Payer: Self-pay

## 2023-02-11 DIAGNOSIS — E1169 Type 2 diabetes mellitus with other specified complication: Secondary | ICD-10-CM

## 2023-02-12 ENCOUNTER — Other Ambulatory Visit: Payer: Self-pay | Admitting: Family Medicine

## 2023-02-14 DIAGNOSIS — I10 Essential (primary) hypertension: Secondary | ICD-10-CM | POA: Diagnosis not present

## 2023-02-14 DIAGNOSIS — K5900 Constipation, unspecified: Secondary | ICD-10-CM | POA: Diagnosis not present

## 2023-02-14 DIAGNOSIS — I890 Lymphedema, not elsewhere classified: Secondary | ICD-10-CM | POA: Diagnosis not present

## 2023-02-14 DIAGNOSIS — E7849 Other hyperlipidemia: Secondary | ICD-10-CM | POA: Diagnosis not present

## 2023-02-14 DIAGNOSIS — K529 Noninfective gastroenteritis and colitis, unspecified: Secondary | ICD-10-CM | POA: Diagnosis not present

## 2023-02-14 DIAGNOSIS — E1169 Type 2 diabetes mellitus with other specified complication: Secondary | ICD-10-CM | POA: Diagnosis not present

## 2023-02-14 DIAGNOSIS — Z85828 Personal history of other malignant neoplasm of skin: Secondary | ICD-10-CM | POA: Diagnosis not present

## 2023-02-14 DIAGNOSIS — Z87440 Personal history of urinary (tract) infections: Secondary | ICD-10-CM | POA: Diagnosis not present

## 2023-02-14 DIAGNOSIS — Z96651 Presence of right artificial knee joint: Secondary | ICD-10-CM | POA: Diagnosis not present

## 2023-02-14 DIAGNOSIS — Z85820 Personal history of malignant melanoma of skin: Secondary | ICD-10-CM | POA: Diagnosis not present

## 2023-02-14 DIAGNOSIS — K219 Gastro-esophageal reflux disease without esophagitis: Secondary | ICD-10-CM | POA: Diagnosis not present

## 2023-02-14 DIAGNOSIS — Z87891 Personal history of nicotine dependence: Secondary | ICD-10-CM | POA: Diagnosis not present

## 2023-02-14 DIAGNOSIS — E876 Hypokalemia: Secondary | ICD-10-CM | POA: Diagnosis not present

## 2023-02-14 DIAGNOSIS — Z993 Dependence on wheelchair: Secondary | ICD-10-CM | POA: Diagnosis not present

## 2023-02-14 DIAGNOSIS — M19071 Primary osteoarthritis, right ankle and foot: Secondary | ICD-10-CM | POA: Diagnosis not present

## 2023-02-14 DIAGNOSIS — Z7985 Long-term (current) use of injectable non-insulin antidiabetic drugs: Secondary | ICD-10-CM | POA: Diagnosis not present

## 2023-02-14 DIAGNOSIS — Z4789 Encounter for other orthopedic aftercare: Secondary | ICD-10-CM | POA: Diagnosis not present

## 2023-02-14 DIAGNOSIS — Z981 Arthrodesis status: Secondary | ICD-10-CM | POA: Diagnosis not present

## 2023-02-14 DIAGNOSIS — N3090 Cystitis, unspecified without hematuria: Secondary | ICD-10-CM | POA: Diagnosis not present

## 2023-02-14 DIAGNOSIS — D649 Anemia, unspecified: Secondary | ICD-10-CM | POA: Diagnosis not present

## 2023-02-14 DIAGNOSIS — M797 Fibromyalgia: Secondary | ICD-10-CM | POA: Diagnosis not present

## 2023-02-17 DIAGNOSIS — Z4789 Encounter for other orthopedic aftercare: Secondary | ICD-10-CM | POA: Diagnosis not present

## 2023-02-17 DIAGNOSIS — Z96651 Presence of right artificial knee joint: Secondary | ICD-10-CM | POA: Diagnosis not present

## 2023-02-17 DIAGNOSIS — E1169 Type 2 diabetes mellitus with other specified complication: Secondary | ICD-10-CM | POA: Diagnosis not present

## 2023-02-17 DIAGNOSIS — K529 Noninfective gastroenteritis and colitis, unspecified: Secondary | ICD-10-CM | POA: Diagnosis not present

## 2023-02-17 DIAGNOSIS — D649 Anemia, unspecified: Secondary | ICD-10-CM | POA: Diagnosis not present

## 2023-02-17 DIAGNOSIS — Z85828 Personal history of other malignant neoplasm of skin: Secondary | ICD-10-CM | POA: Diagnosis not present

## 2023-02-17 DIAGNOSIS — Z7985 Long-term (current) use of injectable non-insulin antidiabetic drugs: Secondary | ICD-10-CM | POA: Diagnosis not present

## 2023-02-17 DIAGNOSIS — I10 Essential (primary) hypertension: Secondary | ICD-10-CM | POA: Diagnosis not present

## 2023-02-17 DIAGNOSIS — Z85820 Personal history of malignant melanoma of skin: Secondary | ICD-10-CM | POA: Diagnosis not present

## 2023-02-17 DIAGNOSIS — M797 Fibromyalgia: Secondary | ICD-10-CM | POA: Diagnosis not present

## 2023-02-17 DIAGNOSIS — Z981 Arthrodesis status: Secondary | ICD-10-CM | POA: Diagnosis not present

## 2023-02-17 DIAGNOSIS — N3090 Cystitis, unspecified without hematuria: Secondary | ICD-10-CM | POA: Diagnosis not present

## 2023-02-17 DIAGNOSIS — K219 Gastro-esophageal reflux disease without esophagitis: Secondary | ICD-10-CM | POA: Diagnosis not present

## 2023-02-17 DIAGNOSIS — K5900 Constipation, unspecified: Secondary | ICD-10-CM | POA: Diagnosis not present

## 2023-02-17 DIAGNOSIS — Z87891 Personal history of nicotine dependence: Secondary | ICD-10-CM | POA: Diagnosis not present

## 2023-02-17 DIAGNOSIS — Z993 Dependence on wheelchair: Secondary | ICD-10-CM | POA: Diagnosis not present

## 2023-02-17 DIAGNOSIS — M19071 Primary osteoarthritis, right ankle and foot: Secondary | ICD-10-CM | POA: Diagnosis not present

## 2023-02-17 DIAGNOSIS — E7849 Other hyperlipidemia: Secondary | ICD-10-CM | POA: Diagnosis not present

## 2023-02-17 DIAGNOSIS — I890 Lymphedema, not elsewhere classified: Secondary | ICD-10-CM | POA: Diagnosis not present

## 2023-02-17 DIAGNOSIS — E876 Hypokalemia: Secondary | ICD-10-CM | POA: Diagnosis not present

## 2023-02-17 DIAGNOSIS — Z87440 Personal history of urinary (tract) infections: Secondary | ICD-10-CM | POA: Diagnosis not present

## 2023-02-19 DIAGNOSIS — M25571 Pain in right ankle and joints of right foot: Secondary | ICD-10-CM | POA: Diagnosis not present

## 2023-02-20 ENCOUNTER — Telehealth: Payer: Self-pay | Admitting: Family Medicine

## 2023-02-20 DIAGNOSIS — Z4789 Encounter for other orthopedic aftercare: Secondary | ICD-10-CM | POA: Diagnosis not present

## 2023-02-20 DIAGNOSIS — D649 Anemia, unspecified: Secondary | ICD-10-CM | POA: Diagnosis not present

## 2023-02-20 DIAGNOSIS — Z96651 Presence of right artificial knee joint: Secondary | ICD-10-CM | POA: Diagnosis not present

## 2023-02-20 DIAGNOSIS — K529 Noninfective gastroenteritis and colitis, unspecified: Secondary | ICD-10-CM | POA: Diagnosis not present

## 2023-02-20 DIAGNOSIS — M19071 Primary osteoarthritis, right ankle and foot: Secondary | ICD-10-CM | POA: Diagnosis not present

## 2023-02-20 DIAGNOSIS — E7849 Other hyperlipidemia: Secondary | ICD-10-CM | POA: Diagnosis not present

## 2023-02-20 DIAGNOSIS — I890 Lymphedema, not elsewhere classified: Secondary | ICD-10-CM | POA: Diagnosis not present

## 2023-02-20 DIAGNOSIS — E876 Hypokalemia: Secondary | ICD-10-CM | POA: Diagnosis not present

## 2023-02-20 DIAGNOSIS — Z981 Arthrodesis status: Secondary | ICD-10-CM | POA: Diagnosis not present

## 2023-02-20 DIAGNOSIS — Z85828 Personal history of other malignant neoplasm of skin: Secondary | ICD-10-CM | POA: Diagnosis not present

## 2023-02-20 DIAGNOSIS — Z7985 Long-term (current) use of injectable non-insulin antidiabetic drugs: Secondary | ICD-10-CM | POA: Diagnosis not present

## 2023-02-20 DIAGNOSIS — Z87891 Personal history of nicotine dependence: Secondary | ICD-10-CM | POA: Diagnosis not present

## 2023-02-20 DIAGNOSIS — Z85820 Personal history of malignant melanoma of skin: Secondary | ICD-10-CM | POA: Diagnosis not present

## 2023-02-20 DIAGNOSIS — Z87440 Personal history of urinary (tract) infections: Secondary | ICD-10-CM | POA: Diagnosis not present

## 2023-02-20 DIAGNOSIS — K5900 Constipation, unspecified: Secondary | ICD-10-CM | POA: Diagnosis not present

## 2023-02-20 DIAGNOSIS — K219 Gastro-esophageal reflux disease without esophagitis: Secondary | ICD-10-CM | POA: Diagnosis not present

## 2023-02-20 DIAGNOSIS — I10 Essential (primary) hypertension: Secondary | ICD-10-CM | POA: Diagnosis not present

## 2023-02-20 DIAGNOSIS — E1169 Type 2 diabetes mellitus with other specified complication: Secondary | ICD-10-CM | POA: Diagnosis not present

## 2023-02-20 DIAGNOSIS — N3090 Cystitis, unspecified without hematuria: Secondary | ICD-10-CM | POA: Diagnosis not present

## 2023-02-20 DIAGNOSIS — M797 Fibromyalgia: Secondary | ICD-10-CM | POA: Diagnosis not present

## 2023-02-20 DIAGNOSIS — Z993 Dependence on wheelchair: Secondary | ICD-10-CM | POA: Diagnosis not present

## 2023-02-21 NOTE — Telephone Encounter (Signed)
That is something that we could possibly take over for her but we would have to do it during the visit.  I cannot start managing medications like that without having a discussion about them with the patient in a visit.  She may have to see if she can get 1 more refill from whoever prescribed it for 4 to get through to the visit.

## 2023-02-21 NOTE — Telephone Encounter (Signed)
Appt made for 10/24 at 3:55pm

## 2023-02-25 DIAGNOSIS — Z981 Arthrodesis status: Secondary | ICD-10-CM | POA: Diagnosis not present

## 2023-02-25 DIAGNOSIS — N3090 Cystitis, unspecified without hematuria: Secondary | ICD-10-CM | POA: Diagnosis not present

## 2023-02-25 DIAGNOSIS — E1169 Type 2 diabetes mellitus with other specified complication: Secondary | ICD-10-CM | POA: Diagnosis not present

## 2023-02-25 DIAGNOSIS — M797 Fibromyalgia: Secondary | ICD-10-CM | POA: Diagnosis not present

## 2023-02-25 DIAGNOSIS — K529 Noninfective gastroenteritis and colitis, unspecified: Secondary | ICD-10-CM | POA: Diagnosis not present

## 2023-02-25 DIAGNOSIS — Z4789 Encounter for other orthopedic aftercare: Secondary | ICD-10-CM | POA: Diagnosis not present

## 2023-02-25 DIAGNOSIS — Z7985 Long-term (current) use of injectable non-insulin antidiabetic drugs: Secondary | ICD-10-CM | POA: Diagnosis not present

## 2023-02-25 DIAGNOSIS — Z96651 Presence of right artificial knee joint: Secondary | ICD-10-CM | POA: Diagnosis not present

## 2023-02-25 DIAGNOSIS — M19071 Primary osteoarthritis, right ankle and foot: Secondary | ICD-10-CM | POA: Diagnosis not present

## 2023-02-25 DIAGNOSIS — Z993 Dependence on wheelchair: Secondary | ICD-10-CM | POA: Diagnosis not present

## 2023-02-25 DIAGNOSIS — D649 Anemia, unspecified: Secondary | ICD-10-CM | POA: Diagnosis not present

## 2023-02-25 DIAGNOSIS — Z85828 Personal history of other malignant neoplasm of skin: Secondary | ICD-10-CM | POA: Diagnosis not present

## 2023-02-25 DIAGNOSIS — E7849 Other hyperlipidemia: Secondary | ICD-10-CM | POA: Diagnosis not present

## 2023-02-25 DIAGNOSIS — Z85820 Personal history of malignant melanoma of skin: Secondary | ICD-10-CM | POA: Diagnosis not present

## 2023-02-25 DIAGNOSIS — Z87440 Personal history of urinary (tract) infections: Secondary | ICD-10-CM | POA: Diagnosis not present

## 2023-02-25 DIAGNOSIS — I890 Lymphedema, not elsewhere classified: Secondary | ICD-10-CM | POA: Diagnosis not present

## 2023-02-25 DIAGNOSIS — E876 Hypokalemia: Secondary | ICD-10-CM | POA: Diagnosis not present

## 2023-02-25 DIAGNOSIS — K219 Gastro-esophageal reflux disease without esophagitis: Secondary | ICD-10-CM | POA: Diagnosis not present

## 2023-02-25 DIAGNOSIS — K5900 Constipation, unspecified: Secondary | ICD-10-CM | POA: Diagnosis not present

## 2023-02-25 DIAGNOSIS — I10 Essential (primary) hypertension: Secondary | ICD-10-CM | POA: Diagnosis not present

## 2023-02-25 DIAGNOSIS — Z87891 Personal history of nicotine dependence: Secondary | ICD-10-CM | POA: Diagnosis not present

## 2023-02-27 ENCOUNTER — Other Ambulatory Visit: Payer: Self-pay | Admitting: Family Medicine

## 2023-02-27 ENCOUNTER — Ambulatory Visit (INDEPENDENT_AMBULATORY_CARE_PROVIDER_SITE_OTHER): Payer: Medicare Other | Admitting: Family Medicine

## 2023-02-27 ENCOUNTER — Other Ambulatory Visit: Payer: Medicare Other

## 2023-02-27 ENCOUNTER — Encounter: Payer: Self-pay | Admitting: Family Medicine

## 2023-02-27 VITALS — BP 144/67 | HR 77 | Temp 97.6°F | Ht 65.0 in | Wt 255.0 lb

## 2023-02-27 DIAGNOSIS — Z23 Encounter for immunization: Secondary | ICD-10-CM | POA: Diagnosis not present

## 2023-02-27 DIAGNOSIS — E1140 Type 2 diabetes mellitus with diabetic neuropathy, unspecified: Secondary | ICD-10-CM | POA: Diagnosis not present

## 2023-02-27 DIAGNOSIS — N3941 Urge incontinence: Secondary | ICD-10-CM | POA: Diagnosis not present

## 2023-02-27 MED ORDER — TOLTERODINE TARTRATE ER 4 MG PO CP24
4.0000 mg | ORAL_CAPSULE | Freq: Every day | ORAL | 1 refills | Status: AC
Start: 2023-02-27 — End: ?

## 2023-02-27 MED ORDER — GABAPENTIN 400 MG PO CAPS
400.0000 mg | ORAL_CAPSULE | Freq: Three times a day (TID) | ORAL | 3 refills | Status: DC
Start: 2023-02-27 — End: 2024-02-16

## 2023-02-27 NOTE — Progress Notes (Signed)
BP (!) 144/67   Pulse 77   Temp 97.6 F (36.4 C)   Ht 5\' 5"  (1.651 m)   Wt 255 lb (115.7 kg)   SpO2 93%   BMI 42.43 kg/m    Subjective:   Patient ID: Tiffany Brown, female    DOB: [birth year], 77 y.o.   MRN: [id number]  HPI: Tiffany Brown is a 77 y.o. female presenting on 02/27/2023 for Foot Pain (Right. Needs new Rx for 400mg  Gabapentin TID)   HPI Diabetic neuropathy Patient is coming in today for diabetic neuropathy.  She takes gabapentin and feels like it does help.  She does get it normally from another provider and is coming to get it from [place] again.  Urge incontinence Patient had tried Vesicare for urge incontinence and send did not help, she says she has leakage especially when she stands up she has leakage and she was tried some different.  Relevant past medical, surgical, family and social history reviewed and updated as indicated. Interim medical history since our last visit reviewed. Allergies and medications reviewed and updated.  Review of Systems  Constitutional:  Negative for chills and fever.  HENT:  Negative for ear pain.   Eyes:  Negative for visual disturbance.  Respiratory:  Negative for chest tightness and shortness of breath.   Cardiovascular:  Negative for chest pain and leg swelling.  Genitourinary:  Positive for dysuria and frequency.  Musculoskeletal:  Negative for back pain and gait problem.  Skin:  Negative for rash.  Neurological:  Negative for dizziness, light-headedness and headaches.  Psychiatric/Behavioral:  Negative for agitation and behavioral problems.   All other systems reviewed and are negative.   Per HPI unless specifically indicated above   Allergies as of 02/27/2023   No Known Allergies      Medication List        Accurate as of February 27, 2023  4:31 PM. If you have any questions, ask your nurse or doctor.          amLODipine 5 MG tablet Commonly known as: NORVASC Take 1 tablet (5 mg total) by mouth  daily. What changed: when to take this   buPROPion 150 MG 24 hr tablet Commonly known as: WELLBUTRIN XL TAKE 1 TABLET BY MOUTH EVERY DAY What changed: when to take this   DULoxetine 60 MG capsule Commonly known as: CYMBALTA Take 2 capsules (120 mg total) by mouth daily. What changed:  how much to take when to take this additional instructions   ferrous sulfate 325 (65 FE) MG EC tablet Take 1 tablet (325 mg total) by mouth 2 (two) times daily.   fluticasone 50 MCG/ACT nasal spray Commonly known as: FLONASE Place 2 sprays into both nostrils daily as needed for rhinitis.   furosemide 40 MG tablet Commonly known as: LASIX Take 0.5 tablets (20 mg total) by mouth daily.   gabapentin 400 MG capsule Commonly known as: NEURONTIN Take 1 capsule (400 mg total) by mouth 3 (three) times daily.   losartan 100 MG tablet Commonly known as: COZAAR Take 1 tablet (100 mg total) by mouth daily.   pantoprazole 40 MG tablet Commonly known as: PROTONIX Take 1 tablet (40 mg total) by mouth daily.   potassium chloride SA 20 MEQ tablet Commonly known as: KLOR-CON M Take 1 tablet (20 mEq total) by mouth daily.   pravastatin 80 MG tablet Commonly known as: PRAVACHOL Take 1 tablet (80 mg total) by mouth daily.   Semaglutide (1 MG/DOSE)  4 MG/3ML Sopn Inject 1 mg as directed once a week.   senna-docusate 8.6-50 MG tablet Commonly known as: Senokot-S Take 1 tablet by mouth daily.   tamsulosin 0.4 MG Caps capsule Commonly known as: FLOMAX Take 0.4 mg by mouth daily.   tolterodine 4 MG 24 hr capsule Commonly known as: Detrol LA Take 1 capsule (4 mg total) by mouth daily. Started by: Elige Radon Captola Teschner   trospium 20 MG tablet Commonly known as: SANCTURA Take 20 mg by mouth 2 (two) times daily.         Objective:   BP (!) 144/67   Pulse 77   Temp 97.6 F (36.4 C)   Ht 5\' 5"  (1.651 m)   Wt 255 lb (115.7 kg)   SpO2 93%   BMI 42.43 kg/m   Wt Readings from Last 3  Encounters:  02/27/23 255 lb (115.7 kg)  01/13/23 261 lb (118.4 kg)  12/03/22 283 lb (128.4 kg)    Physical Exam Vitals and nursing note reviewed.  Constitutional:      General: She is not in acute distress.    Appearance: She is well-developed. She is not diaphoretic.  Eyes:     Conjunctiva/sclera: Conjunctivae normal.  Cardiovascular:     Rate and Rhythm: Normal rate and regular rhythm.     Heart sounds: Normal heart sounds. No murmur heard. Pulmonary:     Effort: Pulmonary effort is normal. No respiratory distress.     Breath sounds: Normal breath sounds. No wheezing.  Musculoskeletal:        General: No swelling or tenderness. Normal range of motion.  Skin:    General: Skin is warm and dry.     Findings: No rash.  Neurological:     Mental Status: She is alert and oriented to person, place, and time.     Coordination: Coordination normal.  Psychiatric:        Behavior: Behavior normal.       Assessment & Plan:   Problem List Items Addressed This Visit       Endocrine   Type 2 diabetes mellitus with diabetic neuropathy, unspecified (HCC) - Primary   Relevant Medications   gabapentin (NEURONTIN) 400 MG capsule   Other Visit Diagnoses     Urge incontinence       Relevant Medications   tolterodine (DETROL LA) 4 MG 24 hr capsule       Continue gabapentin, will start Detrol because she did not do well on the Vesicare   Follow up plan: Return in about 4 weeks (around 03/27/2023), or if symptoms worsen or fail to improve, for Diabetes and bladder recheck.  Counseling provided for all of the vaccine components No orders of the defined types were placed in this encounter.   Arville Care, MD [facility] 02/27/2023, 4:31 PM

## 2023-03-04 DIAGNOSIS — M797 Fibromyalgia: Secondary | ICD-10-CM | POA: Diagnosis not present

## 2023-03-04 DIAGNOSIS — D649 Anemia, unspecified: Secondary | ICD-10-CM | POA: Diagnosis not present

## 2023-03-04 DIAGNOSIS — Z7985 Long-term (current) use of injectable non-insulin antidiabetic drugs: Secondary | ICD-10-CM | POA: Diagnosis not present

## 2023-03-04 DIAGNOSIS — M19071 Primary osteoarthritis, right ankle and foot: Secondary | ICD-10-CM | POA: Diagnosis not present

## 2023-03-04 DIAGNOSIS — E876 Hypokalemia: Secondary | ICD-10-CM | POA: Diagnosis not present

## 2023-03-04 DIAGNOSIS — Z87440 Personal history of urinary (tract) infections: Secondary | ICD-10-CM | POA: Diagnosis not present

## 2023-03-04 DIAGNOSIS — Z85820 Personal history of malignant melanoma of skin: Secondary | ICD-10-CM | POA: Diagnosis not present

## 2023-03-04 DIAGNOSIS — K529 Noninfective gastroenteritis and colitis, unspecified: Secondary | ICD-10-CM | POA: Diagnosis not present

## 2023-03-04 DIAGNOSIS — E1169 Type 2 diabetes mellitus with other specified complication: Secondary | ICD-10-CM | POA: Diagnosis not present

## 2023-03-04 DIAGNOSIS — Z85828 Personal history of other malignant neoplasm of skin: Secondary | ICD-10-CM | POA: Diagnosis not present

## 2023-03-04 DIAGNOSIS — I890 Lymphedema, not elsewhere classified: Secondary | ICD-10-CM | POA: Diagnosis not present

## 2023-03-04 DIAGNOSIS — K5900 Constipation, unspecified: Secondary | ICD-10-CM | POA: Diagnosis not present

## 2023-03-04 DIAGNOSIS — I10 Essential (primary) hypertension: Secondary | ICD-10-CM | POA: Diagnosis not present

## 2023-03-04 DIAGNOSIS — N3090 Cystitis, unspecified without hematuria: Secondary | ICD-10-CM | POA: Diagnosis not present

## 2023-03-04 DIAGNOSIS — E7849 Other hyperlipidemia: Secondary | ICD-10-CM | POA: Diagnosis not present

## 2023-03-04 DIAGNOSIS — Z87891 Personal history of nicotine dependence: Secondary | ICD-10-CM | POA: Diagnosis not present

## 2023-03-04 DIAGNOSIS — Z993 Dependence on wheelchair: Secondary | ICD-10-CM | POA: Diagnosis not present

## 2023-03-04 DIAGNOSIS — Z96651 Presence of right artificial knee joint: Secondary | ICD-10-CM | POA: Diagnosis not present

## 2023-03-04 DIAGNOSIS — Z4789 Encounter for other orthopedic aftercare: Secondary | ICD-10-CM | POA: Diagnosis not present

## 2023-03-04 DIAGNOSIS — Z981 Arthrodesis status: Secondary | ICD-10-CM | POA: Diagnosis not present

## 2023-03-04 DIAGNOSIS — K219 Gastro-esophageal reflux disease without esophagitis: Secondary | ICD-10-CM | POA: Diagnosis not present

## 2023-03-11 DIAGNOSIS — Z981 Arthrodesis status: Secondary | ICD-10-CM | POA: Diagnosis not present

## 2023-03-11 DIAGNOSIS — K5900 Constipation, unspecified: Secondary | ICD-10-CM | POA: Diagnosis not present

## 2023-03-11 DIAGNOSIS — M797 Fibromyalgia: Secondary | ICD-10-CM | POA: Diagnosis not present

## 2023-03-11 DIAGNOSIS — Z7985 Long-term (current) use of injectable non-insulin antidiabetic drugs: Secondary | ICD-10-CM | POA: Diagnosis not present

## 2023-03-11 DIAGNOSIS — I10 Essential (primary) hypertension: Secondary | ICD-10-CM | POA: Diagnosis not present

## 2023-03-11 DIAGNOSIS — Z993 Dependence on wheelchair: Secondary | ICD-10-CM | POA: Diagnosis not present

## 2023-03-11 DIAGNOSIS — Z87891 Personal history of nicotine dependence: Secondary | ICD-10-CM | POA: Diagnosis not present

## 2023-03-11 DIAGNOSIS — Z85828 Personal history of other malignant neoplasm of skin: Secondary | ICD-10-CM | POA: Diagnosis not present

## 2023-03-11 DIAGNOSIS — Z85820 Personal history of malignant melanoma of skin: Secondary | ICD-10-CM | POA: Diagnosis not present

## 2023-03-11 DIAGNOSIS — Z87440 Personal history of urinary (tract) infections: Secondary | ICD-10-CM | POA: Diagnosis not present

## 2023-03-11 DIAGNOSIS — Z96651 Presence of right artificial knee joint: Secondary | ICD-10-CM | POA: Diagnosis not present

## 2023-03-11 DIAGNOSIS — K219 Gastro-esophageal reflux disease without esophagitis: Secondary | ICD-10-CM | POA: Diagnosis not present

## 2023-03-11 DIAGNOSIS — M19071 Primary osteoarthritis, right ankle and foot: Secondary | ICD-10-CM | POA: Diagnosis not present

## 2023-03-11 DIAGNOSIS — D649 Anemia, unspecified: Secondary | ICD-10-CM | POA: Diagnosis not present

## 2023-03-11 DIAGNOSIS — E876 Hypokalemia: Secondary | ICD-10-CM | POA: Diagnosis not present

## 2023-03-11 DIAGNOSIS — N3090 Cystitis, unspecified without hematuria: Secondary | ICD-10-CM | POA: Diagnosis not present

## 2023-03-11 DIAGNOSIS — Z4789 Encounter for other orthopedic aftercare: Secondary | ICD-10-CM | POA: Diagnosis not present

## 2023-03-11 DIAGNOSIS — I890 Lymphedema, not elsewhere classified: Secondary | ICD-10-CM | POA: Diagnosis not present

## 2023-03-11 DIAGNOSIS — E7849 Other hyperlipidemia: Secondary | ICD-10-CM | POA: Diagnosis not present

## 2023-03-11 DIAGNOSIS — K529 Noninfective gastroenteritis and colitis, unspecified: Secondary | ICD-10-CM | POA: Diagnosis not present

## 2023-03-11 DIAGNOSIS — E1169 Type 2 diabetes mellitus with other specified complication: Secondary | ICD-10-CM | POA: Diagnosis not present

## 2023-03-17 ENCOUNTER — Ambulatory Visit: Payer: Medicare Other | Admitting: Family Medicine

## 2023-03-17 DIAGNOSIS — Z85820 Personal history of malignant melanoma of skin: Secondary | ICD-10-CM | POA: Diagnosis not present

## 2023-03-17 DIAGNOSIS — M797 Fibromyalgia: Secondary | ICD-10-CM | POA: Diagnosis not present

## 2023-03-17 DIAGNOSIS — K529 Noninfective gastroenteritis and colitis, unspecified: Secondary | ICD-10-CM | POA: Diagnosis not present

## 2023-03-17 DIAGNOSIS — Z4789 Encounter for other orthopedic aftercare: Secondary | ICD-10-CM | POA: Diagnosis not present

## 2023-03-17 DIAGNOSIS — Z87440 Personal history of urinary (tract) infections: Secondary | ICD-10-CM | POA: Diagnosis not present

## 2023-03-17 DIAGNOSIS — Z96651 Presence of right artificial knee joint: Secondary | ICD-10-CM | POA: Diagnosis not present

## 2023-03-17 DIAGNOSIS — Z7985 Long-term (current) use of injectable non-insulin antidiabetic drugs: Secondary | ICD-10-CM | POA: Diagnosis not present

## 2023-03-17 DIAGNOSIS — E876 Hypokalemia: Secondary | ICD-10-CM | POA: Diagnosis not present

## 2023-03-17 DIAGNOSIS — E7849 Other hyperlipidemia: Secondary | ICD-10-CM | POA: Diagnosis not present

## 2023-03-17 DIAGNOSIS — Z85828 Personal history of other malignant neoplasm of skin: Secondary | ICD-10-CM | POA: Diagnosis not present

## 2023-03-17 DIAGNOSIS — Z981 Arthrodesis status: Secondary | ICD-10-CM | POA: Diagnosis not present

## 2023-03-17 DIAGNOSIS — Z993 Dependence on wheelchair: Secondary | ICD-10-CM | POA: Diagnosis not present

## 2023-03-17 DIAGNOSIS — K5900 Constipation, unspecified: Secondary | ICD-10-CM | POA: Diagnosis not present

## 2023-03-17 DIAGNOSIS — M19071 Primary osteoarthritis, right ankle and foot: Secondary | ICD-10-CM | POA: Diagnosis not present

## 2023-03-17 DIAGNOSIS — E1169 Type 2 diabetes mellitus with other specified complication: Secondary | ICD-10-CM | POA: Diagnosis not present

## 2023-03-17 DIAGNOSIS — Z87891 Personal history of nicotine dependence: Secondary | ICD-10-CM | POA: Diagnosis not present

## 2023-03-17 DIAGNOSIS — K219 Gastro-esophageal reflux disease without esophagitis: Secondary | ICD-10-CM | POA: Diagnosis not present

## 2023-03-17 DIAGNOSIS — I10 Essential (primary) hypertension: Secondary | ICD-10-CM | POA: Diagnosis not present

## 2023-03-17 DIAGNOSIS — I890 Lymphedema, not elsewhere classified: Secondary | ICD-10-CM | POA: Diagnosis not present

## 2023-03-17 DIAGNOSIS — D649 Anemia, unspecified: Secondary | ICD-10-CM | POA: Diagnosis not present

## 2023-03-17 DIAGNOSIS — N3090 Cystitis, unspecified without hematuria: Secondary | ICD-10-CM | POA: Diagnosis not present

## 2023-04-07 DIAGNOSIS — M24571 Contracture, right ankle: Secondary | ICD-10-CM | POA: Diagnosis not present

## 2023-04-07 DIAGNOSIS — R2689 Other abnormalities of gait and mobility: Secondary | ICD-10-CM | POA: Diagnosis not present

## 2023-04-16 ENCOUNTER — Ambulatory Visit: Payer: Medicare Other | Admitting: Family Medicine

## 2023-04-17 ENCOUNTER — Telehealth: Payer: Self-pay | Admitting: Pharmacist

## 2023-04-17 NOTE — Telephone Encounter (Signed)
   Patient enrolled in the [employer] nordisk patient assistance program for Ozempic.  Patient to increase to 1mg  weekly.  #2 boxes of Ozempic 0.5mg  arrived to PCP office today.  Instructed patient to give 0.5mg  x2 weekly to equal 1mg  while awaiting new dose.   Kieth Brightly, PharmD, BCACP, CPP Clinical Pharmacist, [facility]

## 2023-05-21 ENCOUNTER — Encounter: Payer: Self-pay | Admitting: Family Medicine

## 2023-05-21 ENCOUNTER — Ambulatory Visit: Payer: HMO | Admitting: Family Medicine

## 2023-05-21 VITALS — BP 147/65 | HR 76 | Ht 65.0 in | Wt 268.0 lb

## 2023-05-21 DIAGNOSIS — I10 Essential (primary) hypertension: Secondary | ICD-10-CM

## 2023-05-21 DIAGNOSIS — M797 Fibromyalgia: Secondary | ICD-10-CM | POA: Diagnosis not present

## 2023-05-21 DIAGNOSIS — E782 Mixed hyperlipidemia: Secondary | ICD-10-CM | POA: Diagnosis not present

## 2023-05-21 DIAGNOSIS — R300 Dysuria: Secondary | ICD-10-CM | POA: Diagnosis not present

## 2023-05-21 DIAGNOSIS — E1140 Type 2 diabetes mellitus with diabetic neuropathy, unspecified: Secondary | ICD-10-CM

## 2023-05-21 DIAGNOSIS — Z7985 Long-term (current) use of injectable non-insulin antidiabetic drugs: Secondary | ICD-10-CM

## 2023-05-21 DIAGNOSIS — E1169 Type 2 diabetes mellitus with other specified complication: Secondary | ICD-10-CM

## 2023-05-21 DIAGNOSIS — F3342 Major depressive disorder, recurrent, in full remission: Secondary | ICD-10-CM | POA: Diagnosis not present

## 2023-05-21 DIAGNOSIS — E785 Hyperlipidemia, unspecified: Secondary | ICD-10-CM | POA: Diagnosis not present

## 2023-05-21 LAB — MICROSCOPIC EXAMINATION
Epithelial Cells (non renal): NONE SEEN /[HPF] (ref 0–10)
RBC, Urine: NONE SEEN /[HPF] (ref 0–2)
Renal Epithel, UA: NONE SEEN /[HPF]

## 2023-05-21 LAB — URINALYSIS, COMPLETE
Bilirubin, UA: NEGATIVE
Glucose, UA: NEGATIVE
Ketones, UA: NEGATIVE
Nitrite, UA: POSITIVE — AB
Protein,UA: NEGATIVE
RBC, UA: NEGATIVE
Specific Gravity, UA: 1.015 (ref 1.005–1.030)
Urobilinogen, Ur: 0.2 mg/dL (ref 0.2–1.0)
pH, UA: 7 (ref 5.0–7.5)

## 2023-05-21 LAB — LIPID PANEL

## 2023-05-21 LAB — BAYER DCA HB A1C WAIVED: HB A1C (BAYER DCA - WAIVED): 5.3 % (ref 4.8–5.6)

## 2023-05-21 MED ORDER — AMLODIPINE BESYLATE 5 MG PO TABS: 5.0000 mg | ORAL_TABLET | Freq: Every day | ORAL | 3 refills | Status: DC

## 2023-05-21 MED ORDER — PANTOPRAZOLE SODIUM 40 MG PO TBEC: 40.0000 mg | DELAYED_RELEASE_TABLET | Freq: Every day | ORAL | 3 refills | Status: DC

## 2023-05-21 MED ORDER — BUPROPION HCL ER (XL) 300 MG PO TB24: 300.0000 mg | ORAL_TABLET | Freq: Every day | ORAL | 3 refills | Status: DC

## 2023-05-21 MED ORDER — PRAVASTATIN SODIUM 80 MG PO TABS: 80.0000 mg | ORAL_TABLET | Freq: Every day | ORAL | 3 refills | Status: DC

## 2023-05-21 MED ORDER — LOSARTAN POTASSIUM 100 MG PO TABS: 100.0000 mg | ORAL_TABLET | Freq: Every day | ORAL | 3 refills | Status: DC

## 2023-05-21 MED ORDER — SEMAGLUTIDE (2 MG/DOSE) 8 MG/3ML ~~LOC~~ SOPN: 2.0000 mg | PEN_INJECTOR | SUBCUTANEOUS | 3 refills | Status: DC

## 2023-05-21 MED ORDER — DULOXETINE HCL 60 MG PO CPEP: 120.0000 mg | ORAL_CAPSULE | Freq: Every day | ORAL | 3 refills | Status: DC

## 2023-05-21 NOTE — Progress Notes (Signed)
 BP (!) 147/65   Pulse 76   Ht 5\' 5"  (1.651 m)   Wt 268 lb (121.6 kg)   SpO2 98%   BMI 44.60 kg/m    Subjective:   Patient ID: Tiffany Brown, female    DOB: [birth year], 78 y.o.   MRN: [id number]  HPI: Tiffany Brown is a 78 y.o. female presenting on 05/21/2023 for Medical Management of Chronic Issues, Diabetes, Hyperlipidemia, and Hypertension   HPI Type 2 diabetes mellitus Patient comes in today for recheck of his diabetes. Patient has been currently taking Ozempic . Patient is currently on an ACE inhibitor/ARB. Patient has not seen an ophthalmologist this year. Patient denies any new issues with their feet. The symptom started onset as an adult hypertension and hyperlipidemia ARE RELATED TO DM   Hypertension Patient is currently on amlodipine  and losartan  and furosemide , and their blood pressure today is 147/65. Patient denies any lightheadedness or dizziness. Patient denies headaches, blurred vision, chest pains, shortness of breath, or weakness. Denies any side effects from medication and is content with current medication.   Hyperlipidemia Patient is coming in for recheck of his hyperlipidemia. The patient is currently taking pravastatin . They deny any issues with myalgias or history of liver damage from it. They deny any focal numbness or weakness or chest pain.   Depression recheck and fibromyalgia Patient is coming in for depression and fibromyalgia recheck.  She currently takes duloxetine  and bupropion  and gabapentin .  She has been feeling down a lot more and feeling less energetic and sleeping a lot more and just not feeling well.    05/21/2023    3:19 PM 02/27/2023    4:09 PM 01/13/2023   11:46 AM 09/26/2022    2:46 PM 08/16/2022   10:33 AM  Depression screen PHQ 2/9  Decreased Interest 1 0 0 0 2  Down, Depressed, Hopeless 1 0 0 0 2  PHQ - 2 Score 2 0 0 0 4  Altered sleeping 2 1 0 0 2  Tired, decreased energy 2 0 1 0 2  Change in appetite 3 0 1 0 2  Feeling bad or  failure about yourself  2 1 0 0 2  Trouble concentrating 1 1 0 0 0  Moving slowly or fidgety/restless 1 1 0 0 1  Suicidal thoughts 2 0 0 0 1  PHQ-9 Score 15 4 2  0 14  Difficult doing work/chores Very difficult Very difficult Not difficult at all Not difficult at all Very difficult    Patient has been having dysuria and pain when she urinates.  She has had the frequency and has had that for some time but she is having the burning recently wants to leave urine.  She says been going on for couple weeks.  Patient has been having sinus congestion and ear congestion.   Relevant past medical, surgical, family and social history reviewed and updated as indicated. Interim medical history since our last visit reviewed. Allergies and medications reviewed and updated.  Review of Systems  Constitutional:  Negative for chills and fever.  Eyes:  Negative for visual disturbance.  Respiratory:  Negative for chest tightness and shortness of breath.   Cardiovascular:  Negative for chest pain and leg swelling.  Genitourinary:  Positive for frequency and urgency. Negative for difficulty urinating, dysuria and hematuria.  Musculoskeletal:  Negative for back pain and gait problem.  Skin:  Negative for rash.  Neurological:  Negative for dizziness, light-headedness and headaches.  Psychiatric/Behavioral:  Positive for dysphoric  mood and sleep disturbance. Negative for agitation, behavioral problems, self-injury and suicidal ideas. The patient is nervous/anxious.   All other systems reviewed and are negative.   Per HPI unless specifically indicated above   Allergies as of 05/21/2023   No Known Allergies      Medication List        Accurate as of May 21, 2023  3:50 PM. If you have any questions, ask your nurse or doctor.          STOP taking these medications    Semaglutide  (1 MG/DOSE) 4 MG/3ML Sopn Replaced by: Semaglutide  (2 MG/DOSE) 8 MG/3ML Sopn Stopped by: Melodie Spry A Wilna Pennie        TAKE these medications    amLODipine  5 MG tablet Commonly known as: NORVASC  Take 1 tablet (5 mg total) by mouth daily. What changed: when to take this   buPROPion  300 MG 24 hr tablet Commonly known as: WELLBUTRIN  XL Take 1 tablet (300 mg total) by mouth daily. What changed:  medication strength how much to take Changed by: Lucio Sabin Alzora Ha   D3 PO Take 10 mg by mouth daily.   DULoxetine  60 MG capsule Commonly known as: CYMBALTA  Take 2 capsules (120 mg total) by mouth daily. What changed:  how much to take when to take this additional instructions   ferrous sulfate  325 (65 FE) MG EC tablet Take 1 tablet (325 mg total) by mouth 2 (two) times daily.   fluticasone  50 MCG/ACT nasal spray Commonly known as: FLONASE  Place 2 sprays into both nostrils daily as needed for rhinitis.   furosemide  40 MG tablet Commonly known as: LASIX  Take 0.5 tablets (20 mg total) by mouth daily.   gabapentin  400 MG capsule Commonly known as: NEURONTIN  Take 1 capsule (400 mg total) by mouth 3 (three) times daily.   losartan  100 MG tablet Commonly known as: COZAAR  Take 1 tablet (100 mg total) by mouth daily.   Magnesium 250 MG Caps Take 250 mg by mouth daily.   pantoprazole  40 MG tablet Commonly known as: PROTONIX  Take 1 tablet (40 mg total) by mouth daily.   potassium chloride  SA 20 MEQ tablet Commonly known as: KLOR-CON  M Take 1 tablet (20 mEq total) by mouth daily.   pravastatin  80 MG tablet Commonly known as: PRAVACHOL  Take 1 tablet (80 mg total) by mouth daily.   Semaglutide  (2 MG/DOSE) 8 MG/3ML Sopn Inject 2 mg as directed once a week. Replaces: Semaglutide  (1 MG/DOSE) 4 MG/3ML Sopn Started by: Lucio Sabin Keyetta Hollingworth   senna-docusate 8.6-50 MG tablet Commonly known as: Senokot-S Take 1 tablet by mouth daily.   tamsulosin  0.4 MG Caps capsule Commonly known as: FLOMAX  Take 0.4 mg by mouth daily.   tolterodine  4 MG 24 hr capsule Commonly known as: Detrol  LA Take 1  capsule (4 mg total) by mouth daily.   trospium 20 MG tablet Commonly known as: SANCTURA Take 20 mg by mouth 2 (two) times daily.         Objective:   BP (!) 147/65   Pulse 76   Ht 5\' 5"  (1.651 m)   Wt 268 lb (121.6 kg)   SpO2 98%   BMI 44.60 kg/m   Wt Readings from Last 3 Encounters:  05/21/23 268 lb (121.6 kg)  02/27/23 255 lb (115.7 kg)  01/13/23 261 lb (118.4 kg)    Physical Exam Vitals and nursing note reviewed.  Constitutional:      General: She is not in acute distress.  Appearance: She is well-developed. She is not diaphoretic.  Eyes:     Conjunctiva/sclera: Conjunctivae normal.  Cardiovascular:     Rate and Rhythm: Normal rate and regular rhythm.     Heart sounds: Normal heart sounds. No murmur heard. Pulmonary:     Effort: Pulmonary effort is normal. No respiratory distress.     Breath sounds: Normal breath sounds. No wheezing.  Musculoskeletal:        General: No swelling or tenderness. Normal range of motion.  Skin:    General: Skin is warm and dry.     Findings: No rash.  Neurological:     Mental Status: She is alert and oriented to person, place, and time.     Coordination: Coordination normal.  Psychiatric:        Behavior: Behavior normal.       Assessment & Plan:   Problem List Items Addressed This Visit       Cardiovascular and Mediastinum   Essential hypertension, benign   Relevant Medications   amLODipine  (NORVASC ) 5 MG tablet   losartan  (COZAAR ) 100 MG tablet   pravastatin  (PRAVACHOL ) 80 MG tablet   Other Relevant Orders   CBC with Differential/Platelet   CMP14+EGFR   Lipid panel   Bayer DCA Hb A1c Waived   Microalbumin / creatinine urine ratio     Endocrine   DM type 2 with diabetic dyslipidemia (HCC)   Relevant Medications   losartan  (COZAAR ) 100 MG tablet   pravastatin  (PRAVACHOL ) 80 MG tablet   Semaglutide , 2 MG/DOSE, 8 MG/3ML SOPN   Type 2 diabetes mellitus with diabetic neuropathy, unspecified (HCC) - Primary    Relevant Medications   losartan  (COZAAR ) 100 MG tablet   pravastatin  (PRAVACHOL ) 80 MG tablet   Semaglutide , 2 MG/DOSE, 8 MG/3ML SOPN   Other Relevant Orders   CBC with Differential/Platelet   CMP14+EGFR   Lipid panel   Bayer DCA Hb A1c Waived   Microalbumin / creatinine urine ratio     Other   Hyperlipidemia   Relevant Medications   amLODipine  (NORVASC ) 5 MG tablet   losartan  (COZAAR ) 100 MG tablet   pravastatin  (PRAVACHOL ) 80 MG tablet   Other Relevant Orders   CBC with Differential/Platelet   CMP14+EGFR   Lipid panel   Bayer DCA Hb A1c Waived   Microalbumin / creatinine urine ratio   Recurrent major depressive disorder, in full remission (HCC)   Relevant Medications   DULoxetine  (CYMBALTA ) 60 MG capsule   buPROPion  (WELLBUTRIN  XL) 300 MG 24 hr tablet   Fibromyalgia   Relevant Medications   DULoxetine  (CYMBALTA ) 60 MG capsule   buPROPion  (WELLBUTRIN  XL) 300 MG 24 hr tablet   Other Visit Diagnoses       Dysuria       Relevant Orders   Urinalysis, Complete   Urine Culture       A1c was good at 5.3.  For ears and sinuses recommended that she continue with Tylenol  and then she can use some Flonase  over-the-counter and then also get some Claritin and a humidifier, if she can do preferably a Vicks humidifier   Patient will leave a urine all the way out from the office today.  We will increase her antidepressant of Wellbutrin  to 300 mg daily and will also increase her Ozempic  to 2 mg to try and help with weight loss. Follow up plan: Return in about 3 months (around 08/19/2023), or if symptoms worsen or fail to improve, for Diabetes and depression and anxiety recheck.  Counseling provided for all of the vaccine components Orders Placed This Encounter  Procedures   Urine Culture   CBC with Differential/Platelet   CMP14+EGFR   Lipid panel   Bayer DCA Hb A1c Waived   Microalbumin / creatinine urine ratio   Urinalysis, Complete    Jolyne Needs, MD [facility] 05/21/2023, 3:50 PM

## 2023-05-22 ENCOUNTER — Telehealth: Payer: Self-pay | Admitting: Pharmacist

## 2023-05-22 LAB — CMP14+EGFR
ALT: 14 IU/L (ref 0–32)
AST: 17 [IU]/L (ref 0–40)
Albumin: 4.1 g/dL (ref 3.8–4.8)
Alkaline Phosphatase: 142 IU/L — ABNORMAL HIGH (ref 44–121)
BUN/Creatinine Ratio: 28 (ref 12–28)
BUN: 17 mg/dL (ref 8–27)
Bilirubin Total: 0.3 mg/dL (ref 0.0–1.2)
CO2: 22 mmol/L (ref 20–29)
Calcium: 9.2 mg/dL (ref 8.7–10.3)
Chloride: 102 mmol/L (ref 96–106)
Creatinine, Ser: 0.6 mg/dL (ref 0.57–1.00)
Globulin, Total: 2.2 g/dL (ref 1.5–4.5)
Glucose: 93 mg/dL (ref 70–99)
Potassium: 4.6 mmol/L (ref 3.5–5.2)
Sodium: 143 mmol/L (ref 134–144)
Total Protein: 6.3 g/dL (ref 6.0–8.5)
eGFR: 92 mL/min/{1.73_m2} (ref >59)

## 2023-05-22 LAB — CBC WITH DIFFERENTIAL/PLATELET
Basophils Absolute: 0 10*3/uL (ref 0.0–0.2)
Basos: 0 %
EOS (ABSOLUTE): 0.2 10*3/uL (ref 0.0–0.4)
Eos: 2 %
Hematocrit: 37.8 % (ref 34.0–46.6)
Hemoglobin: 12.4 g/dL (ref 11.1–15.9)
Immature Grans (Abs): 0 10*3/uL (ref 0.0–0.1)
Immature Granulocytes: 0 %
Lymphocytes Absolute: 2.4 10*3/uL (ref 0.7–3.1)
Lymphs: 29 %
MCH: 30 pg (ref 26.6–33.0)
MCHC: 32.8 g/dL (ref 31.5–35.7)
MCV: 91 fL (ref 79–97)
Monocytes Absolute: 0.4 10*3/uL (ref 0.1–0.9)
Monocytes: 5 %
Neutrophils Absolute: 5.3 10*3/uL (ref 1.4–7.0)
Neutrophils: 64 %
Platelets: 190 10*3/uL (ref 150–450)
RBC: 4.14 x10E6/uL (ref 3.77–5.28)
RDW: 14.1 % (ref 11.7–15.4)
WBC: 8.4 10*3/uL (ref 3.4–10.8)

## 2023-05-22 LAB — LIPID PANEL
Chol/HDL Ratio: 3.3 ratio (ref 0.0–4.4)
Cholesterol, Total: 155 mg/dL (ref 100–199)
HDL: 47 mg/dL (ref >39)
LDL Chol Calc (NIH): 81 mg/dL (ref 0–99)
Triglycerides: 159 mg/dL — ABNORMAL HIGH (ref 0–149)
VLDL Cholesterol Cal: 27 mg/dL (ref 5–40)

## 2023-05-22 LAB — MICROALBUMIN / CREATININE URINE RATIO
Creatinine, Urine: 34.2 mg/dL
Microalb/Creat Ratio: 11 mg/g{creat} (ref 0–29)
Microalbumin, Urine: 3.8 ug/mL

## 2023-05-22 NOTE — Telephone Encounter (Signed)
   Patient enrolled in the [employer] nordisk PAP for Ozempic.  Her dose has been increased to 2mg  weekly.  Can we make sure she gets re-enrolled and dose change.  Okay to email me.  Thank you!  Kieth Brightly, PharmD, BCACP, CPP Clinical Pharmacist, [facility]

## 2023-05-23 ENCOUNTER — Other Ambulatory Visit (HOSPITAL_COMMUNITY): Payer: Self-pay

## 2023-05-23 ENCOUNTER — Telehealth: Payer: Self-pay

## 2023-05-23 ENCOUNTER — Telehealth: Payer: Self-pay | Admitting: Family Medicine

## 2023-05-23 NOTE — Telephone Encounter (Signed)
Renewal discussed with pt yesterday. Renewal in progress.

## 2023-05-23 NOTE — Progress Notes (Signed)
 Pharmacy Medication Assistance Program Note    05/23/2023  Patient ID: Tiffany Brown, female   DOB: 14-Jun-1945, 78 y.o.   MRN: [id number]     05/23/2023  Outreach [employer]  Manufacturer Medication [employer] [facility] Ozempic  Dose of Ozempic 2MG   Type of Sport and exercise psychologist  Date Application Submitted to [employer] 05/23/2023  Method Application Sent to [employer]    Submitted online, pcp pgs emailed to [place].

## 2023-05-24 LAB — URINE CULTURE

## 2023-05-26 ENCOUNTER — Other Ambulatory Visit: Payer: Self-pay | Admitting: Family Medicine

## 2023-05-26 ENCOUNTER — Encounter: Payer: Self-pay | Admitting: Family Medicine

## 2023-05-26 DIAGNOSIS — N3000 Acute cystitis without hematuria: Secondary | ICD-10-CM

## 2023-05-26 MED ORDER — SULFAMETHOXAZOLE-TRIMETHOPRIM 800-160 MG PO TABS: 1.0000 | ORAL_TABLET | Freq: Two times a day (BID) | ORAL | 0 refills | Status: DC

## 2023-06-02 DIAGNOSIS — M79671 Pain in right foot: Secondary | ICD-10-CM | POA: Diagnosis not present

## 2023-06-02 DIAGNOSIS — M1712 Unilateral primary osteoarthritis, left knee: Secondary | ICD-10-CM | POA: Diagnosis not present

## 2023-06-04 ENCOUNTER — Other Ambulatory Visit (HOSPITAL_COMMUNITY): Payer: Self-pay

## 2023-06-13 ENCOUNTER — Other Ambulatory Visit (HOSPITAL_COMMUNITY): Payer: Self-pay

## 2023-06-27 NOTE — Telephone Encounter (Signed)
Per novo rep: provider pages still missing. Will resend to office.

## 2023-06-30 DIAGNOSIS — E11620 Type 2 diabetes mellitus with diabetic dermatitis: Secondary | ICD-10-CM | POA: Diagnosis not present

## 2023-06-30 DIAGNOSIS — G8929 Other chronic pain: Secondary | ICD-10-CM | POA: Diagnosis not present

## 2023-06-30 DIAGNOSIS — I110 Hypertensive heart disease with heart failure: Secondary | ICD-10-CM | POA: Diagnosis not present

## 2023-06-30 DIAGNOSIS — I509 Heart failure, unspecified: Secondary | ICD-10-CM | POA: Diagnosis not present

## 2023-06-30 DIAGNOSIS — E1142 Type 2 diabetes mellitus with diabetic polyneuropathy: Secondary | ICD-10-CM | POA: Diagnosis not present

## 2023-06-30 DIAGNOSIS — I10 Essential (primary) hypertension: Secondary | ICD-10-CM | POA: Diagnosis not present

## 2023-06-30 DIAGNOSIS — E1169 Type 2 diabetes mellitus with other specified complication: Secondary | ICD-10-CM | POA: Diagnosis not present

## 2023-06-30 DIAGNOSIS — Z6841 Body Mass Index (BMI) 40.0 and over, adult: Secondary | ICD-10-CM | POA: Diagnosis not present

## 2023-06-30 DIAGNOSIS — F330 Major depressive disorder, recurrent, mild: Secondary | ICD-10-CM | POA: Diagnosis not present

## 2023-06-30 DIAGNOSIS — E785 Hyperlipidemia, unspecified: Secondary | ICD-10-CM | POA: Diagnosis not present

## 2023-06-30 DIAGNOSIS — E261 Secondary hyperaldosteronism: Secondary | ICD-10-CM | POA: Diagnosis not present

## 2023-07-01 NOTE — Telephone Encounter (Signed)
 Did Dr Dettinger receive these and fill out?

## 2023-07-02 NOTE — Telephone Encounter (Signed)
 I do not have these papers on my desk currently, I may have already filled them out in the past week but I do not have them here currently so we may need to check on that.

## 2023-07-03 NOTE — Telephone Encounter (Signed)
 I received the paperwork and placed it on Dr Dettinger's desk.

## 2023-07-03 NOTE — Telephone Encounter (Signed)
 Thank you Durward Mallard! We will be on the lookout for it today!

## 2023-07-11 NOTE — Telephone Encounter (Signed)
 Faxed completed provider pages to [employer].

## 2023-07-17 ENCOUNTER — Other Ambulatory Visit (HOSPITAL_COMMUNITY): Payer: Self-pay

## 2023-07-17 NOTE — Telephone Encounter (Signed)
 Rec'd missing insurance info from [employer].   Faxed copy of insurance card (HTA) to company.

## 2023-07-21 ENCOUNTER — Telehealth: Payer: Self-pay | Admitting: Family Medicine

## 2023-07-21 NOTE — Telephone Encounter (Signed)
 Informed pt that her Ozempic is not here yet. Gave pt assistance number [phone].

## 2023-07-21 NOTE — Telephone Encounter (Signed)
 Copied from CRM [id number]. Topic: General - Call Back - No Documentation >> Jul 21, 2023  2:11 PM Tiffany Brown wrote: Reason for CRM: Patient is calling to see if her ozempic has arrived at the clinic. She states she filled out paperwork in order to get some for free.   Callback #: [phone]

## 2023-08-01 ENCOUNTER — Telehealth: Payer: Self-pay

## 2023-08-01 NOTE — Telephone Encounter (Signed)
 Per DPR left message on [phone] informing patient that Ozempic is in office and ready for pick up. [facility] 08/01/2023

## 2023-08-06 ENCOUNTER — Other Ambulatory Visit: Payer: Self-pay | Admitting: Family Medicine

## 2023-08-06 DIAGNOSIS — F3342 Major depressive disorder, recurrent, in full remission: Secondary | ICD-10-CM

## 2023-08-27 ENCOUNTER — Ambulatory Visit: Payer: HMO | Admitting: Family Medicine

## 2023-08-27 ENCOUNTER — Encounter: Payer: Self-pay | Admitting: Family Medicine

## 2023-08-27 VITALS — BP 135/67 | HR 81 | Temp 97.9°F | Ht 65.0 in | Wt 275.0 lb

## 2023-08-27 DIAGNOSIS — E782 Mixed hyperlipidemia: Secondary | ICD-10-CM | POA: Diagnosis not present

## 2023-08-27 DIAGNOSIS — I10 Essential (primary) hypertension: Secondary | ICD-10-CM

## 2023-08-27 DIAGNOSIS — E785 Hyperlipidemia, unspecified: Secondary | ICD-10-CM

## 2023-08-27 DIAGNOSIS — E1140 Type 2 diabetes mellitus with diabetic neuropathy, unspecified: Secondary | ICD-10-CM | POA: Diagnosis not present

## 2023-08-27 DIAGNOSIS — Z7985 Long-term (current) use of injectable non-insulin antidiabetic drugs: Secondary | ICD-10-CM

## 2023-08-27 DIAGNOSIS — E1169 Type 2 diabetes mellitus with other specified complication: Secondary | ICD-10-CM | POA: Diagnosis not present

## 2023-08-27 LAB — BAYER DCA HB A1C WAIVED: HB A1C (BAYER DCA - WAIVED): 5.7 % — ABNORMAL HIGH (ref 4.8–5.6)

## 2023-08-27 NOTE — Progress Notes (Signed)
 BP 135/67   Pulse 81   Temp 97.9 F (36.6 C)   Ht 5\' 5"  (1.651 m)   Wt 275 lb (124.7 kg)   SpO2 95%   BMI 45.76 kg/m    Subjective:   Patient ID: Tiffany Brown, female    DOB: 02/22/1946, 78 y.o.   MRN: [id number]  HPI: Tiffany Brown is a 78 y.o. female presenting on 08/27/2023 for Medical Management of Chronic Issues, Diabetes, Anxiety, and Depression   HPI Type 2 diabetes mellitus Patient comes in today for recheck of his diabetes. Patient has been currently taking Ozempic  currently but gets a lot of stomach issues from it, we are trying to do the Ozempic  to help with weight loss as well. Patient is currently on an ACE inhibitor/ARB. Patient has not seen an ophthalmologist this year. Patient denies any new issues with their feet. The symptom started onset as an adult hypertension and hyperlipidemia ARE RELATED TO DM   Hypertension Patient is currently on amlodipine  and furosemide  and losartan , and their blood pressure today is 135/67. Patient denies any lightheadedness or dizziness. Patient denies headaches, blurred vision, chest pains, shortness of breath, or weakness. Denies any side effects from medication and is content with current medication.   Hyperlipidemia Patient is coming in for recheck of his hyperlipidemia. The patient is currently taking pravastatin . They deny any issues with myalgias or history of liver damage from it. They deny any focal numbness or weakness or chest pain.   Relevant past medical, surgical, family and social history reviewed and updated as indicated. Interim medical history since our last visit reviewed. Allergies and medications reviewed and updated.  Review of Systems  Constitutional:  Negative for chills and fever.  Eyes:  Negative for visual disturbance.  Respiratory:  Negative for chest tightness and shortness of breath.   Cardiovascular:  Negative for chest pain and leg swelling.  Genitourinary:  Negative for difficulty urinating and  dysuria.  Musculoskeletal:  Positive for arthralgias, back pain, gait problem and myalgias.  Skin:  Negative for rash.  Neurological:  Negative for dizziness, light-headedness and headaches.  Psychiatric/Behavioral:  Negative for agitation and behavioral problems.   All other systems reviewed and are negative.   Per HPI unless specifically indicated above   Allergies as of 08/27/2023   No Known Allergies      Medication List        Accurate as of August 27, 2023  3:34 PM. If you have any questions, ask your nurse or doctor.          STOP taking these medications    Semaglutide  (2 MG/DOSE) 8 MG/3ML Sopn Stopped by: Lucio Sabin Tore Carreker   sulfamethoxazole -trimethoprim  800-160 MG tablet Commonly known as: BACTRIM  DS Stopped by: Lucio Sabin Antavius Sperbeck       TAKE these medications    amLODipine  5 MG tablet Commonly known as: NORVASC  Take 1 tablet (5 mg total) by mouth daily.   buPROPion  300 MG 24 hr tablet Commonly known as: WELLBUTRIN  XL Take 1 tablet (300 mg total) by mouth daily.   D3 PO Take 10 mg by mouth daily.   DULoxetine  60 MG capsule Commonly known as: CYMBALTA  Take 2 capsules (120 mg total) by mouth daily.   ferrous sulfate  325 (65 FE) MG EC tablet Take 1 tablet (325 mg total) by mouth 2 (two) times daily.   fluticasone  50 MCG/ACT nasal spray Commonly known as: FLONASE  Place 2 sprays into both nostrils daily as needed for rhinitis.  furosemide  40 MG tablet Commonly known as: LASIX  Take 0.5 tablets (20 mg total) by mouth daily.   gabapentin  400 MG capsule Commonly known as: NEURONTIN  Take 1 capsule (400 mg total) by mouth 3 (three) times daily.   losartan  100 MG tablet Commonly known as: COZAAR  Take 1 tablet (100 mg total) by mouth daily.   Magnesium 250 MG Caps Take 250 mg by mouth daily.   pantoprazole  40 MG tablet Commonly known as: PROTONIX  Take 1 tablet (40 mg total) by mouth daily.   potassium chloride  SA 20 MEQ tablet Commonly  known as: KLOR-CON  M Take 1 tablet (20 mEq total) by mouth daily.   pravastatin  80 MG tablet Commonly known as: PRAVACHOL  Take 1 tablet (80 mg total) by mouth daily.   senna-docusate 8.6-50 MG tablet Commonly known as: Senokot-S Take 1 tablet by mouth daily.   tamsulosin  0.4 MG Caps capsule Commonly known as: FLOMAX  Take 0.4 mg by mouth daily.   tolterodine  4 MG 24 hr capsule Commonly known as: Detrol  LA Take 1 capsule (4 mg total) by mouth daily.   trospium 20 MG tablet Commonly known as: SANCTURA Take 20 mg by mouth 2 (two) times daily.         Objective:   BP 135/67   Pulse 81   Temp 97.9 F (36.6 C)   Ht 5\' 5"  (1.651 m)   Wt 275 lb (124.7 kg)   SpO2 95%   BMI 45.76 kg/m   Wt Readings from Last 3 Encounters:  08/27/23 275 lb (124.7 kg)  05/21/23 268 lb (121.6 kg)  02/27/23 255 lb (115.7 kg)    Physical Exam Vitals and nursing note reviewed.  Constitutional:      General: She is not in acute distress.    Appearance: She is well-developed. She is not diaphoretic.  Eyes:     Conjunctiva/sclera: Conjunctivae normal.  Cardiovascular:     Rate and Rhythm: Normal rate and regular rhythm.     Heart sounds: Normal heart sounds. No murmur heard. Pulmonary:     Effort: Pulmonary effort is normal. No respiratory distress.     Breath sounds: Normal breath sounds. No wheezing.  Musculoskeletal:        General: Swelling (Trace edema) present.  Skin:    General: Skin is warm and dry.     Findings: No rash.  Neurological:     Mental Status: She is alert and oriented to person, place, and time.     Coordination: Coordination normal.  Psychiatric:        Behavior: Behavior normal.       Assessment & Plan:   Problem List Items Addressed This Visit       Cardiovascular and Mediastinum   Essential hypertension, benign   Relevant Orders   Bayer DCA Hb A1c Waived   CMP14+EGFR   CBC with Differential/Platelet     Endocrine   DM type 2 with diabetic  dyslipidemia (HCC)   Type 2 diabetes mellitus with diabetic neuropathy, unspecified (HCC) - Primary   Relevant Orders   Bayer DCA Hb A1c Waived   CMP14+EGFR   CBC with Differential/Platelet     Other   Hyperlipidemia   Relevant Orders   Bayer DCA Hb A1c Waived   CMP14+EGFR   CBC with Differential/Platelet    Patient will do A1c and blood work on the way out.  Blood pressure values look good.  Patient has a lot of arthralgias and aches and pains, will make sure the  gabapentin  is 400 mg 3 times a day and she is on Cymbalta  as well.  Encouraged that she needs to start exercising more consistently even though it hurts because a lot of her aches and pains are because she stuck in wheelchair or second bed all day long. Follow up plan: Return in about 3 months (around 11/26/2023), or if symptoms worsen or fail to improve, for Diabetes hypertension and hyperlipidemia.  Counseling provided for all of the vaccine components Orders Placed This Encounter  Procedures   Bayer DCA Hb A1c Waived   CMP14+EGFR   CBC with Differential/Platelet    Jolyne Needs, MD [facility] 08/27/2023, 3:34 PM

## 2023-08-28 LAB — CBC WITH DIFFERENTIAL/PLATELET
Basophils Absolute: 0 10*3/uL (ref 0.0–0.2)
Basos: 0 %
EOS (ABSOLUTE): 0.3 10*3/uL (ref 0.0–0.4)
Eos: 3 %
Hematocrit: 38.1 % (ref 34.0–46.6)
Hemoglobin: 12.7 g/dL (ref 11.1–15.9)
Immature Grans (Abs): 0 10*3/uL (ref 0.0–0.1)
Immature Granulocytes: 0 %
Lymphocytes Absolute: 2.2 10*3/uL (ref 0.7–3.1)
Lymphs: 25 %
MCH: 30 pg (ref 26.6–33.0)
MCHC: 33.3 g/dL (ref 31.5–35.7)
MCV: 90 fL (ref 79–97)
Monocytes Absolute: 0.5 10*3/uL (ref 0.1–0.9)
Monocytes: 6 %
Neutrophils Absolute: 5.9 10*3/uL (ref 1.4–7.0)
Neutrophils: 66 %
Platelets: 196 10*3/uL (ref 150–450)
RBC: 4.23 x10E6/uL (ref 3.77–5.28)
RDW: 13.7 % (ref 11.7–15.4)
WBC: 8.9 10*3/uL (ref 3.4–10.8)

## 2023-08-28 LAB — CMP14+EGFR
ALT: 12 IU/L (ref 0–32)
AST: 16 IU/L (ref 0–40)
Albumin: 4.3 g/dL (ref 3.8–4.8)
Alkaline Phosphatase: 141 IU/L — ABNORMAL HIGH (ref 44–121)
BUN/Creatinine Ratio: 22 (ref 12–28)
BUN: 16 mg/dL (ref 8–27)
Bilirubin Total: 0.2 mg/dL (ref 0.0–1.2)
CO2: 26 mmol/L (ref 20–29)
Calcium: 9 mg/dL (ref 8.7–10.3)
Chloride: 101 mmol/L (ref 96–106)
Creatinine, Ser: 0.73 mg/dL (ref 0.57–1.00)
Globulin, Total: 2.1 g/dL (ref 1.5–4.5)
Glucose: 78 mg/dL (ref 70–99)
Potassium: 4.3 mmol/L (ref 3.5–5.2)
Sodium: 142 mmol/L (ref 134–144)
Total Protein: 6.4 g/dL (ref 6.0–8.5)
eGFR: 84 mL/min/{1.73_m2} (ref >59)

## 2023-09-03 DIAGNOSIS — M25571 Pain in right ankle and joints of right foot: Secondary | ICD-10-CM | POA: Diagnosis not present

## 2023-09-04 ENCOUNTER — Encounter: Payer: Self-pay | Admitting: Family Medicine

## 2023-09-30 ENCOUNTER — Ambulatory Visit (INDEPENDENT_AMBULATORY_CARE_PROVIDER_SITE_OTHER): Payer: Medicare Other

## 2023-09-30 VITALS — BP 135/67 | HR 81 | Ht 65.0 in | Wt 275.0 lb

## 2023-09-30 DIAGNOSIS — Z79891 Long term (current) use of opiate analgesic: Secondary | ICD-10-CM | POA: Insufficient documentation

## 2023-09-30 DIAGNOSIS — M25562 Pain in left knee: Secondary | ICD-10-CM | POA: Insufficient documentation

## 2023-09-30 DIAGNOSIS — Z0000 Encounter for general adult medical examination without abnormal findings: Secondary | ICD-10-CM

## 2023-09-30 DIAGNOSIS — M545 Low back pain, unspecified: Secondary | ICD-10-CM | POA: Insufficient documentation

## 2023-09-30 NOTE — Patient Instructions (Signed)
 Tiffany Brown , Thank you for taking time out of your busy schedule to complete your Annual Wellness Visit with me. I enjoyed our conversation and look forward to speaking with you again next year. I, as well as your care team,  appreciate your ongoing commitment to your health goals. Please review the following plan we discussed and let me know if I can assist you in the future. Your Game plan/ To Do List    Follow up Visits: Next Medicare AWV with our clinical staff: 09/30/24 at 1:50p.m.   Next Office Visit with your provider: 11/26/23 at 12:55p.m.  Clinician Recommendations:  Aim for 30 minutes of exercise or brisk walking, 6-8 glasses of water, and 5 servings of fruits and vegetables each day. N/a      This is a list of the screening recommended for you and due dates:  Health Maintenance  Topic Date Due   Hepatitis C Screening  05/20/2024*   Mammogram  08/26/2024*   DEXA scan (bone density measurement)  08/26/2024*   COVID-19 Vaccine (4 - 2024-25 season) 08/26/2024*   Flu Shot  12/05/2023   Hemoglobin A1C  02/26/2024   Yearly kidney health urinalysis for diabetes  05/20/2024   Yearly kidney function blood test for diabetes  08/26/2024   Medicare Annual Wellness Visit  09/29/2024   DTaP/Tdap/Td vaccine (3 - Td or Tdap) 05/02/2029   Pneumonia Vaccine  Completed   Zoster (Shingles) Vaccine  Completed   HPV Vaccine  Aged Out   Meningitis B Vaccine  Aged Out   Complete foot exam   Discontinued   Eye exam for diabetics  Discontinued   Colon Cancer Screening  Discontinued  *Topic was postponed. The date shown is not the original due date.    Advanced directives: (Declined) Advance directive discussed with you today. Even though you declined this today, please call our office should you change your mind, and we can give you the proper paperwork for you to fill out. Advance Care Planning is important because it:  [x]  Makes sure you receive the medical care that is consistent with your  values, goals, and preferences  [x]  It provides guidance to your family and loved ones and reduces their decisional burden about whether or not they are making the right decisions based on your wishes.  Follow the link provided in your after visit summary or read over the paperwork we have mailed to you to help you started getting your Advance Directives in place. If you need assistance in completing these, please reach out to us  so that we can help you!  See attachments for Preventive Care and Fall Prevention Tips.

## 2023-09-30 NOTE — Progress Notes (Signed)
 Subjective:   Tiffany Brown is a 78 y.o. who presents for a Medicare Wellness preventive visit.  As a reminder, Annual Wellness Visits don't include a physical exam, and some assessments may be limited, especially if this visit is performed virtually. We may recommend an in-person follow-up visit with your provider if needed.  Visit Complete: Virtual I connected with  Tiffany Brown on 09/30/23 by a audio enabled telemedicine application and verified that I am speaking with the correct person using two identifiers.  Patient Location: Home  Provider Location: Home Office  I discussed the limitations of evaluation and management by telemedicine. The patient expressed understanding and agreed to proceed.  Vital Signs: Because this visit was a virtual/telehealth visit, some criteria may be missing or patient reported. Any vitals not documented were not able to be obtained and vitals that have been documented are patient reported.  VideoDeclined- This patient declined Librarian, academic. Therefore the visit was completed with audio only.  Persons Participating in Visit: Patient.  AWV Questionnaire: No: Patient Medicare AWV questionnaire was not completed prior to this visit.  Cardiac Risk Factors include: advanced age (>59men, >24 women);dyslipidemia;hypertension;obesity (BMI >30kg/m2)     Objective:     Today's Vitals   09/30/23 1325 09/30/23 1326  BP: 135/67   Pulse: 81   Weight: 275 lb (124.7 kg)   Height: 5\' 5"  (1.651 m)   PainSc:  7    Body mass index is 45.76 kg/m.     09/30/2023    1:38 PM 12/04/2022    7:00 AM 12/03/2022    1:05 PM 09/26/2022    2:47 PM 09/10/2022    6:41 AM 12/11/2021   11:45 AM 10/24/2021    1:17 PM  Advanced Directives  Does Patient Have a Medical Advance Directive? No No No No No No No  Would patient like information on creating a medical advance directive?  No - Patient declined No - Patient declined No - Patient  declined No - Patient declined  No - Patient declined    Current Medications (verified) Outpatient Encounter Medications as of 09/30/2023  Medication Sig   amLODipine  (NORVASC ) 5 MG tablet Take 1 tablet (5 mg total) by mouth daily.   buPROPion  (WELLBUTRIN  XL) 300 MG 24 hr tablet Take 1 tablet (300 mg total) by mouth daily.   Cholecalciferol (D3 PO) Take 10 mg by mouth daily.   DULoxetine  (CYMBALTA ) 60 MG capsule Take 2 capsules (120 mg total) by mouth daily.   ferrous sulfate  325 (65 FE) MG EC tablet Take 1 tablet (325 mg total) by mouth 2 (two) times daily.   fluticasone  (FLONASE ) 50 MCG/ACT nasal spray Place 2 sprays into both nostrils daily as needed for rhinitis.   furosemide  (LASIX ) 40 MG tablet Take 0.5 tablets (20 mg total) by mouth daily.   gabapentin  (NEURONTIN ) 400 MG capsule Take 1 capsule (400 mg total) by mouth 3 (three) times daily.   losartan  (COZAAR ) 100 MG tablet Take 1 tablet (100 mg total) by mouth daily.   Magnesium 250 MG CAPS Take 250 mg by mouth daily.   pantoprazole  (PROTONIX ) 40 MG tablet Take 1 tablet (40 mg total) by mouth daily.   potassium chloride  SA (KLOR-CON  M) 20 MEQ tablet Take 1 tablet (20 mEq total) by mouth daily.   pravastatin  (PRAVACHOL ) 80 MG tablet Take 1 tablet (80 mg total) by mouth daily.   senna-docusate (SENOKOT-S) 8.6-50 MG tablet Take 1 tablet by mouth daily.  tamsulosin  (FLOMAX ) 0.4 MG CAPS capsule Take 0.4 mg by mouth daily.   tolterodine  (DETROL  LA) 4 MG 24 hr capsule Take 1 capsule (4 mg total) by mouth daily.   trospium (SANCTURA) 20 MG tablet Take 20 mg by mouth 2 (two) times daily.   No facility-administered encounter medications on file as of 09/30/2023.    Allergies (verified) Patient has no known allergies.   History: Past Medical History:  Diagnosis Date   Abnormal glucose    Arthritis    Depression with anxiety    Essential hypertension, benign    Fibromyalgia    GERD (gastroesophageal reflux disease)    Lymphedema of  both lower extremities    and stomach   Melanoma (HCC)    left arm and right arm   Near syncope    Other and unspecified hyperlipidemia    Pre-diabetes    Past Surgical History:  Procedure Laterality Date   ABDOMINAL HYSTERECTOMY     ANKLE ARTHROSCOPY WITH ARTHRODESIS Right 09/10/2022   Procedure: RIGHT EXTENSIVE MIDFOOT CAPSULOTOMY WITH POSTERIOR TIBIAL TENDON LENGTHENING AND ANKLE CAPSULAR RELEASE,  TIBIAL TALOCALCANEAL ARTHRODESIS VERSUS DOUBLE ARTHRODESIS;  Surgeon: Donnamarie Gables, MD;  Location: [facility] OR;  Service: Orthopedics;  Laterality: Right;  LENGTH OF SURGERY: 180 MINUTES   BREAST SURGERY     LEFT   CHOLECYSTECTOMY     ELBOW SURGERY     X's 3 Left   IR ABLATE LIVER CRYOABLATION  02/15/2021   IR RADIOLOGIST EVAL & MGMT  02/08/2021   IR RADIOLOGIST EVAL & MGMT  03/14/2021   MELANOMA EXCISION Left 10/24/2021   Procedure: WIDE LOCAL EXCISION ADVANCEMENT FLAP CLOSURE LEFT UYPPER ARM MELANOMA;  Surgeon: Lockie Rima, MD;  Location: [facility] OR;  Service: General;  Laterality: Left;   RIGHT DISTAL RADIUS FRACTURE     TOTAL KNEE ARTHROPLASTY     RIGHT   Family History  Problem Relation Age of Onset   Cancer Mother        lymphoma   Suicidality Father    Lung cancer Sister    Heart disease Maternal Grandmother        no details   Breast cancer Neg Hx    Social History   Socioeconomic History   Marital status: Married    Spouse name: jr   Number of children: 3   Years of education: 12   Highest education level: 12th grade  Occupational History   Occupation: Homemaker  Tobacco Use   Smoking status: Former    Current packs/day: 0.00    Average packs/day: 0.8 packs/day for 25.0 years (20.0 ttl pk-yrs)    Types: Cigarettes    Start date: 05/06/1969    Quit date: 05/06/1994    Years since quitting: 29.4    Passive exposure: Past   Smokeless tobacco: Never  Vaping Use   Vaping status: Never Used  Substance and Sexual Activity   Alcohol use: Never   Drug use: Never    Sexual activity: Yes    Birth control/protection: Post-menopausal    Comment: Married since 1964  Other Topics Concern   Not on file  Social History Narrative   Has 3 children. Lives with husband. One daughter lives close by   Social Drivers of Health   Financial Resource Strain: Low Risk  (09/30/2023)   Overall Financial Resource Strain (CARDIA)    Difficulty of Paying Living Expenses: Not hard at all  Food Insecurity: No Food Insecurity (09/30/2023)   Hunger Vital Sign  Worried About Programme researcher, broadcasting/film/video in the Last Year: Never true    Ran Out of Food in the Last Year: Never true  Transportation Needs: No Transportation Needs (09/30/2023)   PRAPARE - Administrator, Civil Service (Medical): No    Lack of Transportation (Non-Medical): No  Physical Activity: Insufficiently Active (09/30/2023)   Exercise Vital Sign    Days of Exercise per Week: 3 days    Minutes of Exercise per Session: 10 min  Stress: No Stress Concern Present (09/30/2023)   [employer] of Occupational Health - Occupational Stress Questionnaire    Feeling of Stress : Only a little  Social Connections: Moderately Isolated (09/30/2023)   Social Connection and Isolation Panel [NHANES]    Frequency of Communication with Friends and Family: More than three times a week    Frequency of Social Gatherings with Friends and Family: Never    Attends Religious Services: Never    Database administrator or Organizations: No    Attends Engineer, structural: Never    Marital Status: Married    Tobacco Counseling Counseling given: Yes    Clinical Intake:  Pre-visit preparation completed: Yes  Pain : 0-10 (fibromyalgia) Pain Score: 7  Pain Type: Chronic pain Pain Onset: Other (comment) Pain Frequency: Intermittent Pain Relieving Factors: pt take gabapentin  Effect of Pain on Daily Activities: yes, pt's help especially now since pt has her foot operated on last yr  Pain Relieving Factors: pt  take gabapentin   BMI - recorded: 45.78 Nutritional Status: BMI > 30  Obese Nutritional Risks: None Diabetes: No  Lab Results  Component Value Date   HGBA1C 5.7 (H) 08/27/2023   HGBA1C 5.3 05/21/2023   HGBA1C 6.2 (H) 08/16/2022     How often do you need to have someone help you when you read instructions, pamphlets, or other written materials from your doctor or pharmacy?: 1 - Never  Interpreter Needed?: No  Information entered by :: Alia T/cma   Activities of Daily Living     09/30/2023    1:33 PM 12/04/2022    7:00 AM  In your present state of health, do you have any difficulty performing the following activities:  Hearing? 0 0  Vision? 1 0  Difficulty concentrating or making decisions? 0 0  Walking or climbing stairs? 1 1  Dressing or bathing? 1 1  Comment pt's husband helps if needed   Doing errands, shopping? 1 1  Comment pt's husband drives to/from dr office   Preparing Food and eating ? N   Using the Toilet? N   In the past six months, have you accidently leaked urine? Y   Do you have problems with loss of bowel control? N   Managing your Medications? N   Managing your Finances? N   Housekeeping or managing your Housekeeping? Y   Comment pt's husband helps     Patient Care Team: Dettinger, Lucio Sabin, MD as PCP - General (Family Medicine) Regal, Angus Kenning, DPM as Consulting Physician (Podiatry) Lydia Sams Florentina Huntsman, DPM as Consulting Physician (Podiatry) Delilah Fend, [facility] as Pharmacist (Family Medicine)  Indicate any recent Medical Services you may have received from other than [facility] providers in the past year (date may be approximate).     Assessment:    This is a routine wellness examination for Fantashia.  Hearing/Vision screen Hearing Screening - Comments:: Pt denies hearing dif Vision Screening - Comments:: Pt vision is blurred/going to make an eye appt  Goals Addressed             This Visit's Progress    Patient Stated       Pt would like to  get to a point where she can walk/loose some wt       Depression Screen     09/30/2023    1:42 PM 08/27/2023    2:31 PM 05/21/2023    3:19 PM 02/27/2023    4:09 PM 01/13/2023   11:46 AM 09/26/2022    2:46 PM 08/16/2022   10:33 AM  PHQ 2/9 Scores  PHQ - 2 Score 5 2 2  0 0 0 4  PHQ- 9 Score 21 7 15 4 2  0 14    Fall Risk     09/30/2023    1:32 PM 08/27/2023    2:31 PM 05/21/2023    3:19 PM 02/27/2023    4:09 PM 01/13/2023   11:46 AM  Fall Risk   Falls in the past year? 1 0 0 1 0  Number falls in past yr: 0 0  0   Injury with Fall? 0 0  0   Risk for fall due to : Impaired balance/gait;Impaired mobility Orthopedic patient;Impaired balance/gait;Impaired mobility  Impaired balance/gait;History of fall(s);Orthopedic patient;Impaired mobility   Follow up Falls evaluation completed;Education provided;Falls prevention discussed Falls evaluation completed  Falls evaluation completed     MEDICARE RISK AT HOME:  Medicare Risk at Home Any stairs in or around the home?: Yes If so, are there any without handrails?: Yes Home free of loose throw rugs in walkways, pet beds, electrical cords, etc?: Yes Adequate lighting in your home to reduce risk of falls?: Yes Life alert?: No Use of a cane, walker or w/c?: No Grab bars in the bathroom?: Yes Shower chair or bench in shower?: Yes Elevated toilet seat or a handicapped toilet?: Yes  TIMED UP AND GO:  Was the test performed?  no  Cognitive Function: 6CIT completed        09/30/2023    1:45 PM 09/26/2022    2:48 PM 09/24/2021    3:42 PM 07/22/2019    9:55 AM 07/22/2019    9:42 AM  6CIT Screen  What Year? 0 points 0 points 0 points 0 points 0 points  What month? 0 points 0 points 0 points 0 points 0 points  What time? 0 points 0 points 0 points 0 points   Count back from 20 0 points 0 points 0 points 0 points   Months in reverse 0 points 0 points 0 points 2 points   Repeat phrase 0 points 0 points 2 points 0 points   Total Score 0 points 0  points 2 points 2 points     Immunizations Immunization History  Administered Date(s) Administered   Fluad Quad(high Dose 65+) 01/24/2020, 01/10/2022   Fluad Trivalent(High Dose 65+) 02/27/2023   Influenza, High Dose Seasonal PF 03/06/2016, 01/28/2017, 02/12/2018, 02/25/2019   Influenza,inj,Quad PF,6+ Mos 01/25/2021   Influenza,trivalent, recombinat, inj, PF 02/10/2014, 02/07/2015   Influenza-Unspecified 02/10/2014, 02/07/2015   Moderna Sars-Covid-2 Vaccination 06/30/2019, 07/28/2019, 03/07/2020   Pneumococcal Conjugate-13 07/03/2015   Pneumococcal Polysaccharide-23 08/05/2016   Td,absorbed, Preservative Free, Adult Use, Lf Unspecified 11/16/2004   Tdap 02/10/2014, 05/03/2019   Zoster Recombinant(Shingrix) 10/25/2020, 07/25/2021    Screening Tests Health Maintenance  Topic Date Due   Hepatitis C Screening  05/20/2024 (Originally 11/02/1963)   MAMMOGRAM  08/26/2024 (Originally 08/14/2022)   DEXA SCAN  08/26/2024 (Originally 05/02/2022)   COVID-19 Vaccine (4 -  2024-25 season) 08/26/2024 (Originally 01/05/2023)   INFLUENZA VACCINE  12/05/2023   HEMOGLOBIN A1C  02/26/2024   Diabetic kidney evaluation - Urine ACR  05/20/2024   Diabetic kidney evaluation - eGFR measurement  08/26/2024   Medicare Annual Wellness (AWV)  09/29/2024   DTaP/Tdap/Td (3 - Td or Tdap) 05/02/2029   Pneumonia Vaccine 25+ Years old  Completed   Zoster Vaccines- Shingrix  Completed   HPV VACCINES  Aged Out   Meningococcal B Vaccine  Aged Out   FOOT EXAM  Discontinued   OPHTHALMOLOGY EXAM  Discontinued   Colonoscopy  Discontinued    Health Maintenance  There are no preventive care reminders to display for this patient.  Health Maintenance Items Addressed: See Nurse Notes  Additional Screening:  Vision Screening: Recommended annual ophthalmology exams for early detection of glaucoma and other disorders of the eye.  Dental Screening: Recommended annual dental exams for proper oral hygiene  Community  Resource Referral / Chronic Care Management: CRR required this visit?  No   CCM required this visit?  No   Plan:    I have personally reviewed and noted the following in the patient's chart:   Medical and social history Use of alcohol, tobacco or illicit drugs  Current medications and supplements including opioid prescriptions. Patient is not currently taking opioid prescriptions. Functional ability and status Nutritional status Physical activity Advanced directives List of other physicians Hospitalizations, surgeries, and ER visits in previous 12 months Vitals Screenings to include cognitive, depression, and falls Referrals and appointments  In addition, I have reviewed and discussed with patient certain preventive protocols, quality metrics, and best practice recommendations. A written personalized care plan for preventive services as well as general preventive health recommendations were provided to patient.   Michaelle Adolphus, CMA   09/30/2023   After Visit Summary: (MyChart) Due to this being a telephonic visit, the after visit summary with patients personalized plan was offered to patient via MyChart   Notes: Please refer to Routing Comments.

## 2023-10-11 DIAGNOSIS — H2513 Age-related nuclear cataract, bilateral: Secondary | ICD-10-CM | POA: Diagnosis not present

## 2023-10-11 DIAGNOSIS — H40033 Anatomical narrow angle, bilateral: Secondary | ICD-10-CM | POA: Diagnosis not present

## 2023-10-20 DIAGNOSIS — H25813 Combined forms of age-related cataract, bilateral: Secondary | ICD-10-CM | POA: Diagnosis not present

## 2023-11-05 DIAGNOSIS — F418 Other specified anxiety disorders: Secondary | ICD-10-CM | POA: Diagnosis not present

## 2023-11-05 DIAGNOSIS — H25811 Combined forms of age-related cataract, right eye: Secondary | ICD-10-CM | POA: Diagnosis not present

## 2023-11-13 ENCOUNTER — Telehealth: Payer: Self-pay | Admitting: Pharmacist

## 2023-11-13 ENCOUNTER — Telehealth (INDEPENDENT_AMBULATORY_CARE_PROVIDER_SITE_OTHER): Admitting: Pharmacist

## 2023-11-13 DIAGNOSIS — E119 Type 2 diabetes mellitus without complications: Secondary | ICD-10-CM

## 2023-11-13 DIAGNOSIS — Z7985 Long-term (current) use of injectable non-insulin antidiabetic drugs: Secondary | ICD-10-CM

## 2023-11-13 NOTE — Telephone Encounter (Signed)
 11/13/2023 Name: Tiffany Brown MRN: [id number] DOB: 1945-06-15  Chief Complaint  Patient presents with   Medication Management    Aliana Kreischer Hartsock is a 78 y.o. year old female who presented for a telephone visit.   They were referred to the pharmacist by their PCP for assistance in managing diabetes and medication access.    Subjective:  Care Team: Primary Care Provider: Dettinger, Fonda LABOR, MD   Medication Access/Adherence  Current Pharmacy:  [facility] [phone] - [place], [place] - [address] [place] [address] Phone: [phone] Fax: [phone]   Patient reports affordability concerns with their medications: Yes  Patient reports access/transportation concerns to their pharmacy: No  Patient reports adherence concerns with their medications:  No     Diabetes:  Current medications: none Medications tried in the past: nausea with Ozempic  2mg  weekly  Current glucose readings: reports her blood sugars are trending back up Using traditional glucometer;   Current meal patterns:  Discussed meal planning options and Plate method for healthy eating Avoid sugary drinks and desserts Incorporate balanced protein, non starchy veggies, 1 serving of carbohydrate with each meal Increase water intake Increase physical activity as able  Current physical activity: as able  Current medication access support: novo nordisk PAP   Objective:  Lab Results  Component Value Date   HGBA1C 5.7 (H) 08/27/2023    Lab Results  Component Value Date   CREATININE 0.73 08/27/2023   BUN 16 08/27/2023   NA 142 08/27/2023   K 4.3 08/27/2023   CL 101 08/27/2023   CO2 26 08/27/2023    Lab Results  Component Value Date   CHOL 155 05/21/2023   HDL 47 05/21/2023   LDLCALC 81 05/21/2023   TRIG 159 (H) 05/21/2023   CHOLHDL 3.3 05/21/2023    Medications Reviewed Today     Reviewed by Billee Mliss BIRCH, [facility] (Pharmacist) on 11/13/23 at 1007  Med  List Status: <None>   Medication Order Taking? Sig Documenting Provider Last Dose Status Informant  amLODipine  (NORVASC ) 5 MG tablet [id number] No Take 1 tablet (5 mg total) by mouth daily. Dettinger, Fonda LABOR, MD Taking Active   buPROPion  (WELLBUTRIN  XL) 300 MG 24 hr tablet [id number] No Take 1 tablet (300 mg total) by mouth daily. Dettinger, Fonda LABOR, MD Taking Active   Cholecalciferol (D3 PO) [id number] No Take 10 mg by mouth daily. [provider] Taking Active   DULoxetine  (CYMBALTA ) 60 MG capsule [id number] No Take 2 capsules (120 mg total) by mouth daily. Dettinger, Fonda LABOR, MD Taking Active   ferrous sulfate  325 (65 FE) MG EC tablet [id number] No Take 1 tablet (325 mg total) by mouth 2 (two) times daily. Verdene Purchase, MD Taking Active   fluticasone  (FLONASE ) 50 MCG/ACT nasal spray [id number] No Place 2 sprays into both nostrils daily as needed for rhinitis. Dettinger, Fonda LABOR, MD Taking Active Self, Pharmacy Records  furosemide  (LASIX ) 40 MG tablet [id number] No Take 0.5 tablets (20 mg total) by mouth daily. Verdene Purchase, MD Taking Active   gabapentin  (NEURONTIN ) 400 MG capsule [id number] No Take 1 capsule (400 mg total) by mouth 3 (three) times daily. Dettinger, Fonda LABOR, MD Taking Active   losartan  (COZAAR ) 100 MG tablet [id number] No Take 1 tablet (100 mg total) by mouth daily. Dettinger, Fonda LABOR, MD Taking Active   Magnesium 250 MG CAPS [id number] No Take 250 mg by mouth daily. [provider] Taking Active   pantoprazole  (PROTONIX ) 40 MG tablet  [id number] No Take 1 tablet (40 mg total) by mouth daily. Dettinger, Fonda LABOR, MD Taking Active   potassium chloride  SA (KLOR-CON  M) 20 MEQ tablet [id number] No Take 1 tablet (20 mEq total) by mouth daily. Verdene Purchase, MD Taking Active   pravastatin  (PRAVACHOL ) 80 MG tablet [id number] No Take 1 tablet (80 mg total) by mouth daily. Dettinger, Fonda LABOR, MD Taking Active   senna-docusate (SENOKOT-S) 8.6-50 MG tablet [id number] No  Take 1 tablet by mouth daily. Verdene Purchase, MD Taking Active   tamsulosin  (FLOMAX ) 0.4 MG CAPS capsule [id number] No Take 0.4 mg by mouth daily. [provider] Taking Active Self, Pharmacy Records  tolterodine  (DETROL  LA) 4 MG 24 hr capsule [id number] No Take 1 capsule (4 mg total) by mouth daily. Dettinger, Fonda LABOR, MD Taking Active   trospium (SANCTURA) 20 MG tablet [id number] No Take 20 mg by mouth 2 (two) times daily. [provider] Taking Active Self, Pharmacy Records              Assessment/Plan:   Diabetes: - Currently controlled, however blood sugar is increasing and weight gain in the absence of Ozempic ; was unable to tolerate the 2mg  weekly (due to nausea), however we will restart patient at 0.25mg  weekly and increase as able (needs to lose weight to have knee surgery) - Reviewed long term cardiovascular and renal outcomes of uncontrolled blood sugar - Reviewed goal A1c, goal fasting, and goal 2 hour post prandial glucose - Reviewed dietary modifications including FOLLOWING A HEART HEALTHY DIET/HEALTHY PLATE METHOD - Patient denies personal or family history of multiple endocrine neoplasia type 2, medullary thyroid  cancer; personal history of pancreatitis or gallbladder disease. - Recommend to check glucose daily (fasting) or if symptomatic - Enrolled in [place] [place] --dose change request sent to [facility] to change dose to 0.25mg /0.5mg  ozempic     Follow Up Plan: 1 month  Mliss Tarry Griffin, PharmD, BCACP, CPP Clinical Pharmacist, [facility]

## 2023-11-13 NOTE — Telephone Encounter (Signed)
 Patient can't tolerate 2mg  ozempic  Please complete dose change form to 0.5mg  weekly Thank you!

## 2023-11-14 ENCOUNTER — Telehealth: Payer: Self-pay

## 2023-11-14 NOTE — Telephone Encounter (Signed)
 PAP: Patient assistance application for OZEMPIC  CHANGE IN DOSE  through [employer] Nordisk has been SENT TO JULIE PRUITT FOR FURTHER PROCESSING

## 2023-11-21 DIAGNOSIS — H25812 Combined forms of age-related cataract, left eye: Secondary | ICD-10-CM | POA: Diagnosis not present

## 2023-11-21 DIAGNOSIS — F418 Other specified anxiety disorders: Secondary | ICD-10-CM | POA: Diagnosis not present

## 2023-11-26 ENCOUNTER — Encounter: Payer: Self-pay | Admitting: Family Medicine

## 2023-11-26 ENCOUNTER — Ambulatory Visit: Admitting: Family Medicine

## 2023-11-26 ENCOUNTER — Telehealth: Payer: Self-pay | Admitting: Family Medicine

## 2023-11-26 VITALS — BP 135/70 | HR 77 | Ht 65.0 in | Wt 289.0 lb

## 2023-11-26 DIAGNOSIS — E1169 Type 2 diabetes mellitus with other specified complication: Secondary | ICD-10-CM | POA: Diagnosis not present

## 2023-11-26 DIAGNOSIS — I10 Essential (primary) hypertension: Secondary | ICD-10-CM | POA: Diagnosis not present

## 2023-11-26 DIAGNOSIS — F3342 Major depressive disorder, recurrent, in full remission: Secondary | ICD-10-CM | POA: Diagnosis not present

## 2023-11-26 DIAGNOSIS — E782 Mixed hyperlipidemia: Secondary | ICD-10-CM | POA: Diagnosis not present

## 2023-11-26 DIAGNOSIS — E785 Hyperlipidemia, unspecified: Secondary | ICD-10-CM | POA: Diagnosis not present

## 2023-11-26 DIAGNOSIS — M797 Fibromyalgia: Secondary | ICD-10-CM | POA: Diagnosis not present

## 2023-11-26 LAB — BAYER DCA HB A1C WAIVED: HB A1C (BAYER DCA - WAIVED): 6.9 % — ABNORMAL HIGH (ref 4.8–5.6)

## 2023-11-26 NOTE — Telephone Encounter (Signed)
 Pt said she needs to make appt with julie

## 2023-11-26 NOTE — Progress Notes (Signed)
 BP 135/70   Pulse 77   Ht 5' 5 (1.651 m)   Wt 289 lb (131.1 kg)   SpO2 93%   BMI 48.09 kg/m    Subjective:   Patient ID: Tiffany Brown, female    DOB: 1945/05/15, 78 y.o.   MRN: [id number]  HPI: Tiffany Brown is a 78 y.o. female presenting on 11/26/2023 for Medical Management of Chronic Issues and Diabetes   HPI Type 2 diabetes mellitus Patient comes in today for recheck of his diabetes. Patient has been currently taking Ozempic . Patient is currently on an ACE inhibitor/ARB. Patient has not seen an ophthalmologist this year. Patient denies any new issues with their feet. The symptom started onset as an adult obesity and hypertension and hyperlipidemia ARE RELATED TO DM   Hypertension Patient is currently on amlodipine  and furosemide  losartan , and their blood pressure today is 135/70. Patient denies any lightheadedness or dizziness. Patient denies headaches, blurred vision, chest pains, shortness of breath, or weakness. Denies any side effects from medication and is content with current medication.   Hyperlipidemia Patient is coming in for recheck of his hyperlipidemia. The patient is currently taking pravastatin . They deny any issues with myalgias or history of liver damage from it. They deny any focal numbness or weakness or chest pain.   Depression and fibromyalgia Patient currently takes Wellbutrin  and Cymbalta  and gabapentin  to help with depression and fibromyalgia and she feels like she does pretty well on them.  She still fights some pain especially from joint issues.    11/26/2023   12:55 PM 09/30/2023    1:42 PM 08/27/2023    2:31 PM 05/21/2023    3:19 PM 02/27/2023    4:09 PM  Depression screen PHQ 2/9  Decreased Interest 2 2 1 1  0  Down, Depressed, Hopeless 1 3 1 1  0  PHQ - 2 Score 3 5 2 2  0  Altered sleeping 2 2 1 2 1   Tired, decreased energy 2 3 1 2  0  Change in appetite 3 3 1 3  0  Feeling bad or failure about yourself  2 3 1 2 1   Trouble concentrating 1 2 0  1 1  Moving slowly or fidgety/restless 1 2 0 1 1  Suicidal thoughts 0 1 1 2  0  PHQ-9 Score 14 21 7 15 4   Difficult doing work/chores Extremely dIfficult Somewhat difficult Very difficult Very difficult Very difficult     Relevant past medical, surgical, family and social history reviewed and updated as indicated. Interim medical history since our last visit reviewed. Allergies and medications reviewed and updated.  Review of Systems  Constitutional:  Negative for chills and fever.  Eyes:  Negative for visual disturbance.  Respiratory:  Negative for chest tightness and shortness of breath.   Cardiovascular:  Negative for chest pain and leg swelling.  Musculoskeletal:  Negative for back pain and gait problem.  Skin:  Negative for rash.  Neurological:  Negative for dizziness, light-headedness and headaches.  Psychiatric/Behavioral:  Negative for agitation and behavioral problems.   All other systems reviewed and are negative.   Per HPI unless specifically indicated above   Allergies as of 11/26/2023   No Known Allergies      Medication List        Accurate as of November 26, 2023  1:29 PM. If you have any questions, ask your nurse or doctor.          amLODipine  5 MG tablet Commonly known as: NORVASC  Take  1 tablet (5 mg total) by mouth daily.   buPROPion  300 MG 24 hr tablet Commonly known as: WELLBUTRIN  XL Take 1 tablet (300 mg total) by mouth daily.   D3 PO Take 10 mg by mouth daily.   DULoxetine  60 MG capsule Commonly known as: CYMBALTA  Take 2 capsules (120 mg total) by mouth daily.   ferrous sulfate  325 (65 FE) MG EC tablet Take 1 tablet (325 mg total) by mouth 2 (two) times daily.   fluticasone  50 MCG/ACT nasal spray Commonly known as: FLONASE  Place 2 sprays into both nostrils daily as needed for rhinitis.   furosemide  40 MG tablet Commonly known as: LASIX  Take 0.5 tablets (20 mg total) by mouth daily.   gabapentin  400 MG capsule Commonly known as:  NEURONTIN  Take 1 capsule (400 mg total) by mouth 3 (three) times daily.   losartan  100 MG tablet Commonly known as: COZAAR  Take 1 tablet (100 mg total) by mouth daily.   Magnesium 250 MG Caps Take 250 mg by mouth daily.   ofloxacin 0.3 % ophthalmic solution Commonly known as: OCUFLOX Place 1 drop into the left eye 4 (four) times daily. For one week   pantoprazole  40 MG tablet Commonly known as: PROTONIX  Take 1 tablet (40 mg total) by mouth daily.   potassium chloride  SA 20 MEQ tablet Commonly known as: KLOR-CON  M Take 1 tablet (20 mEq total) by mouth daily.   pravastatin  80 MG tablet Commonly known as: PRAVACHOL  Take 1 tablet (80 mg total) by mouth daily.   prednisoLONE acetate 1 % ophthalmic suspension Commonly known as: PRED FORTE Place 1 drop into both eyes 4 (four) times daily.   senna-docusate 8.6-50 MG tablet Commonly known as: Senokot-S Take 1 tablet by mouth daily.   tamsulosin  0.4 MG Caps capsule Commonly known as: FLOMAX  Take 0.4 mg by mouth daily.   tolterodine  4 MG 24 hr capsule Commonly known as: Detrol  LA Take 1 capsule (4 mg total) by mouth daily.   trospium 20 MG tablet Commonly known as: SANCTURA Take 20 mg by mouth 2 (two) times daily.         Objective:   BP 135/70   Pulse 77   Ht 5' 5 (1.651 m)   Wt 289 lb (131.1 kg)   SpO2 93%   BMI 48.09 kg/m   Wt Readings from Last 3 Encounters:  11/26/23 289 lb (131.1 kg)  09/30/23 275 lb (124.7 kg)  08/27/23 275 lb (124.7 kg)    Physical Exam Vitals and nursing note reviewed.  Constitutional:      General: She is not in acute distress.    Appearance: She is well-developed. She is not diaphoretic.  Eyes:     Conjunctiva/sclera: Conjunctivae normal.  Cardiovascular:     Rate and Rhythm: Normal rate and regular rhythm.     Heart sounds: Normal heart sounds. No murmur heard. Pulmonary:     Effort: Pulmonary effort is normal. No respiratory distress.     Breath sounds: Normal breath  sounds. No wheezing.  Skin:    General: Skin is warm and dry.     Findings: No rash.  Neurological:     Mental Status: She is alert and oriented to person, place, and time.     Coordination: Coordination normal.  Psychiatric:        Behavior: Behavior normal.       Assessment & Plan:   Problem List Items Addressed This Visit       Cardiovascular and Mediastinum  Essential hypertension, benign     Endocrine   DM type 2 with diabetic dyslipidemia (HCC) - Primary   Relevant Orders   Bayer DCA Hb A1c Waived   BMP8+EGFR   Lipid panel     Other   Hyperlipidemia   Recurrent major depressive disorder, in full remission (HCC)   Fibromyalgia    A1c looks good at 6.9.  Blood pressure and everything else looks good today.  She did start her on Ozempic  to help with weight loss and with the diabetes.  She is really going to focus on that and has not had any stomach issues yet on the low-dose over the past 2 weeks. Follow up plan: Return in about 3 months (around 02/26/2024), or if symptoms worsen or fail to improve, for Diabetes recheck.  Counseling provided for all of the vaccine components Orders Placed This Encounter  Procedures   Bayer DCA Hb A1c Waived   BMP8+EGFR   Lipid panel    Fonda Levins, MD [facility] 11/26/2023, 1:29 PM

## 2023-11-27 DIAGNOSIS — I872 Venous insufficiency (chronic) (peripheral): Secondary | ICD-10-CM | POA: Diagnosis not present

## 2023-11-27 DIAGNOSIS — L570 Actinic keratosis: Secondary | ICD-10-CM | POA: Diagnosis not present

## 2023-11-27 DIAGNOSIS — X32XXXA Exposure to sunlight, initial encounter: Secondary | ICD-10-CM | POA: Diagnosis not present

## 2023-11-27 LAB — BMP8+EGFR
BUN/Creatinine Ratio: 28 (ref 12–28)
BUN: 20 mg/dL (ref 8–27)
CO2: 24 mmol/L (ref 20–29)
Calcium: 9.4 mg/dL (ref 8.7–10.3)
Chloride: 100 mmol/L (ref 96–106)
Creatinine, Ser: 0.71 mg/dL (ref 0.57–1.00)
Glucose: 120 mg/dL — ABNORMAL HIGH (ref 70–99)
Potassium: 4.6 mmol/L (ref 3.5–5.2)
Sodium: 141 mmol/L (ref 134–144)
eGFR: 86 mL/min/1.73 (ref >59)

## 2023-11-27 LAB — LIPID PANEL
Chol/HDL Ratio: 3.2 ratio (ref 0.0–4.4)
Cholesterol, Total: 192 mg/dL (ref 100–199)
HDL: 60 mg/dL (ref >39)
LDL Chol Calc (NIH): 97 mg/dL (ref 0–99)
Triglycerides: 209 mg/dL — ABNORMAL HIGH (ref 0–149)
VLDL Cholesterol Cal: 35 mg/dL (ref 5–40)

## 2023-12-01 NOTE — Telephone Encounter (Signed)
 Completed refill request form faxed to [facility] [facility].

## 2023-12-04 ENCOUNTER — Ambulatory Visit: Payer: Self-pay | Admitting: Family Medicine

## 2023-12-04 NOTE — Telephone Encounter (Signed)
 Dose decrease to 0.5mg  approved

## 2023-12-06 DIAGNOSIS — H43393 Other vitreous opacities, bilateral: Secondary | ICD-10-CM | POA: Diagnosis not present

## 2023-12-17 ENCOUNTER — Telehealth: Payer: Self-pay

## 2023-12-17 NOTE — Telephone Encounter (Signed)
 Copied from CRM [id number]. Topic: Clinical - Medication Question >> Dec 17, 2023  2:37 PM Rosaria E wrote: Reason for CRM: Pt called requesting for Mliss to contact her if she has anymore ozempic  because the patient is down to her last dose. Please advise

## 2024-01-06 DIAGNOSIS — M79674 Pain in right toe(s): Secondary | ICD-10-CM | POA: Diagnosis not present

## 2024-01-06 DIAGNOSIS — B351 Tinea unguium: Secondary | ICD-10-CM | POA: Diagnosis not present

## 2024-01-06 DIAGNOSIS — I70203 Unspecified atherosclerosis of native arteries of extremities, bilateral legs: Secondary | ICD-10-CM | POA: Diagnosis not present

## 2024-01-06 DIAGNOSIS — M79675 Pain in left toe(s): Secondary | ICD-10-CM | POA: Diagnosis not present

## 2024-01-08 DIAGNOSIS — C44311 Basal cell carcinoma of skin of nose: Secondary | ICD-10-CM | POA: Diagnosis not present

## 2024-01-08 DIAGNOSIS — I890 Lymphedema, not elsewhere classified: Secondary | ICD-10-CM | POA: Diagnosis not present

## 2024-01-08 DIAGNOSIS — I872 Venous insufficiency (chronic) (peripheral): Secondary | ICD-10-CM | POA: Diagnosis not present

## 2024-01-08 DIAGNOSIS — X32XXXD Exposure to sunlight, subsequent encounter: Secondary | ICD-10-CM | POA: Diagnosis not present

## 2024-01-08 DIAGNOSIS — L570 Actinic keratosis: Secondary | ICD-10-CM | POA: Diagnosis not present

## 2024-01-29 ENCOUNTER — Telehealth: Payer: Self-pay

## 2024-01-29 NOTE — Telephone Encounter (Signed)
   In process of completing [employer] refills for patients OZEMPIC  medication.  Application emailed to JULIE P for signature.

## 2024-02-05 ENCOUNTER — Telehealth: Payer: Self-pay | Admitting: Pharmacist

## 2024-02-05 NOTE — Telephone Encounter (Signed)
 Assisted in completion of [employer] patient assistance application (ozempic ) for patient.  Patient and provider signatures captured, reviewed and faxed back to [facility].  Medication list reviewed and FYI tab updated.   Arlen Legendre Dattero Sharee Sturdy, PharmD, BCACP, CPP Clinical Pharmacist, [facility]

## 2024-02-16 ENCOUNTER — Telehealth: Payer: Self-pay | Admitting: Family Medicine

## 2024-02-16 DIAGNOSIS — E1140 Type 2 diabetes mellitus with diabetic neuropathy, unspecified: Secondary | ICD-10-CM

## 2024-02-16 MED ORDER — GABAPENTIN 400 MG PO CAPS: 400.0000 mg | ORAL_CAPSULE | Freq: Three times a day (TID) | ORAL | 0 refills | Status: DC

## 2024-02-16 NOTE — Telephone Encounter (Signed)
 Refills ent. 30 day supply

## 2024-02-16 NOTE — Telephone Encounter (Signed)
  Prescription Request  02/16/2024  Is this a Controlled Substance medicine? NO  Have you seen your PCP in the last 2 weeks? No pt has appt on 02/26/2024 but has ran out of this medication and has no refills  If YES, route message to pool  -  If NO, patient needs to be scheduled for appointment.  What is the name of the medication or equipment? gabapentin  (NEURONTIN ) 400 MG capsule   Have you contacted your pharmacy to request a refill? yes   Which pharmacy would you like this sent to? [facility]   Patient notified that their request is being sent to the clinical staff for review and that they should receive a response within 2 business days.

## 2024-02-18 NOTE — Telephone Encounter (Signed)
 Faxed completed refill form to [employer].  Ozempic  0.5mg  dose pens

## 2024-02-19 ENCOUNTER — Other Ambulatory Visit: Payer: Self-pay | Admitting: Family Medicine

## 2024-02-19 DIAGNOSIS — E1140 Type 2 diabetes mellitus with diabetic neuropathy, unspecified: Secondary | ICD-10-CM

## 2024-02-26 ENCOUNTER — Ambulatory Visit: Admitting: Family Medicine

## 2024-02-26 ENCOUNTER — Encounter: Payer: Self-pay | Admitting: Family Medicine

## 2024-02-26 VITALS — BP 133/70 | HR 73 | Ht 65.0 in | Wt 293.0 lb

## 2024-02-26 DIAGNOSIS — E1140 Type 2 diabetes mellitus with diabetic neuropathy, unspecified: Secondary | ICD-10-CM | POA: Diagnosis not present

## 2024-02-26 DIAGNOSIS — E782 Mixed hyperlipidemia: Secondary | ICD-10-CM | POA: Diagnosis not present

## 2024-02-26 DIAGNOSIS — Z7985 Long-term (current) use of injectable non-insulin antidiabetic drugs: Secondary | ICD-10-CM

## 2024-02-26 DIAGNOSIS — E785 Hyperlipidemia, unspecified: Secondary | ICD-10-CM | POA: Diagnosis not present

## 2024-02-26 DIAGNOSIS — I10 Essential (primary) hypertension: Secondary | ICD-10-CM

## 2024-02-26 DIAGNOSIS — E1169 Type 2 diabetes mellitus with other specified complication: Secondary | ICD-10-CM | POA: Diagnosis not present

## 2024-02-26 DIAGNOSIS — Z23 Encounter for immunization: Secondary | ICD-10-CM

## 2024-02-26 LAB — BAYER DCA HB A1C WAIVED: HB A1C (BAYER DCA - WAIVED): 5.6 % (ref 4.8–5.6)

## 2024-02-26 NOTE — Progress Notes (Signed)
 BP 133/70   Pulse 73   Ht 5' 5 (1.651 m)   Wt 293 lb (132.9 kg)   SpO2 95%   BMI 48.76 kg/m    Subjective:   Patient ID: Tiffany Brown, female    DOB: 1945-06-09, 78 y.o.   MRN: [id number]  HPI: Tiffany Brown is a 78 y.o. female presenting on 02/26/2024 for Medical Management of Chronic Issues, Diabetes, and Hypertension   Discussed the use of AI scribe software for clinical note transcription with the patient, who gave verbal consent to proceed.  History of Present Illness   Tiffany Brown is a 78 year old female with diabetes who presents for a recheck of her blood sugar levels and weight management.  Hyperglycemia and weight management - Diabetes managed with Ozempic . - Recent blood sugar reading of 5.6 while using Ozempic . - No weight loss with Ozempic ; weight gain of ten pounds despite dietary and physical activity efforts. - Higher doses of Ozempic  cause nausea. - Dietary modifications include reduced intake of sugary drinks, bread, and sweets, but not complete elimination of sweets; minimal potato consumption. - Attempts to increase daily physical activity using a pedal exerciser, but limited by decreased mobility.  Chronic cough and upper respiratory symptoms - Persistent cough for six months, more pronounced at night. - No symptoms of acid reflux. - Sinus drainage when lying down. - Inconsistent use of Flonase . - Continues to use generic Claritin for allergies.    Relevant past medical, surgical, family and social history reviewed and updated as indicated. Interim medical history since our last visit reviewed. Allergies and medications reviewed and updated.  Review of Systems  Constitutional:  Negative for chills and fever.  Eyes:  Negative for redness and visual disturbance.  Respiratory:  Negative for chest tightness and shortness of breath.   Cardiovascular:  Negative for chest pain and leg swelling.  Genitourinary:  Negative for difficulty  urinating and dysuria.  Musculoskeletal:  Positive for arthralgias, back pain, gait problem and myalgias.  Skin:  Negative for rash.  Neurological:  Negative for dizziness, light-headedness and headaches.  Psychiatric/Behavioral:  Negative for agitation and behavioral problems.   All other systems reviewed and are negative.   Per HPI unless specifically indicated above   Allergies as of 02/26/2024   No Known Allergies      Medication List        Accurate as of February 26, 2024  1:31 PM. If you have any questions, ask your nurse or doctor.          amLODipine  5 MG tablet Commonly known as: NORVASC  Take 1 tablet (5 mg total) by mouth daily.   buPROPion  300 MG 24 hr tablet Commonly known as: WELLBUTRIN  XL Take 1 tablet (300 mg total) by mouth daily.   D3 PO Take 10 mg by mouth daily.   DULoxetine  60 MG capsule Commonly known as: CYMBALTA  Take 2 capsules (120 mg total) by mouth daily.   ferrous sulfate  325 (65 FE) MG EC tablet Take 1 tablet (325 mg total) by mouth 2 (two) times daily.   fluticasone  50 MCG/ACT nasal spray Commonly known as: FLONASE  Place 2 sprays into both nostrils daily as needed for rhinitis.   furosemide  40 MG tablet Commonly known as: LASIX  Take 0.5 tablets (20 mg total) by mouth daily.   gabapentin  400 MG capsule Commonly known as: NEURONTIN  TAKE 1 CAPSULE BY MOUTH 3 TIMES DAILY.   losartan  100 MG tablet Commonly known as: COZAAR  Take  1 tablet (100 mg total) by mouth daily.   Magnesium 250 MG Caps Take 250 mg by mouth daily.   ofloxacin 0.3 % ophthalmic solution Commonly known as: OCUFLOX Place 1 drop into the left eye 4 (four) times daily. For one week   pantoprazole  40 MG tablet Commonly known as: PROTONIX  Take 1 tablet (40 mg total) by mouth daily.   potassium chloride  SA 20 MEQ tablet Commonly known as: KLOR-CON  M Take 1 tablet (20 mEq total) by mouth daily.   pravastatin  80 MG tablet Commonly known as: PRAVACHOL  Take 1  tablet (80 mg total) by mouth daily.   prednisoLONE acetate 1 % ophthalmic suspension Commonly known as: PRED FORTE Place 1 drop into both eyes 4 (four) times daily.   senna-docusate 8.6-50 MG tablet Commonly known as: Senokot-S Take 1 tablet by mouth daily.   tamsulosin  0.4 MG Caps capsule Commonly known as: FLOMAX  Take 0.4 mg by mouth daily.   tolterodine  4 MG 24 hr capsule Commonly known as: Detrol  LA Take 1 capsule (4 mg total) by mouth daily.   trospium 20 MG tablet Commonly known as: SANCTURA Take 20 mg by mouth 2 (two) times daily.         Objective:   BP 133/70   Pulse 73   Ht 5' 5 (1.651 m)   Wt 293 lb (132.9 kg)   SpO2 95%   BMI 48.76 kg/m   Wt Readings from Last 3 Encounters:  02/26/24 293 lb (132.9 kg)  11/26/23 289 lb (131.1 kg)  09/30/23 275 lb (124.7 kg)    Physical Exam Physical Exam   VITALS: BP- 133/70 CHEST: Lungs clear to auscultation bilaterally. CARDIOVASCULAR: Heart sounds normal. EXTREMITIES: No edema in legs.         Assessment & Plan:   Problem List Items Addressed This Visit       Cardiovascular and Mediastinum   Essential hypertension, benign     Endocrine   DM type 2 with diabetic dyslipidemia (HCC)   Type 2 diabetes mellitus with diabetic neuropathy, unspecified (HCC) - Primary   Relevant Orders   Bayer DCA Hb A1c Waived     Other   Hyperlipidemia       Obesity Obesity persists with recent weight gain. Limited mobility affects weight management. Current Ozempic  dose ineffective for weight loss. Bariatric surgery discussed but declined due to age concerns. - Encourage daily pedal exerciser use. - Consider aquatic exercise for low-impact activity. - Continue dietary modifications to reduce caloric intake.  Type 2 diabetes mellitus Type 2 diabetes well-controlled with Hemoglobin A1c of 5.6. Ozempic  effective for blood sugar control but not weight loss. Potential dose increase discussed. - Continue Ozempic  at  current dose. - Consider increasing Ozempic  dose after six months if tolerated. - Monitor for changes in prescription assistance for Ozempic .  Essential hypertension Hypertension well-controlled with blood pressure of 133/70 mmHg.  Allergic rhinitis with chronic cough due to postnasal drip Chronic cough likely from postnasal drip, worsens at night. Current use of generic Claritin noted. - Start using Flonase  before bedtime. - Consider Benadryl  at night to reduce postnasal drip.      Follow up plan: Return in about 3 months (around 05/28/2024), or if symptoms worsen or fail to improve, for Diabetes and hypertension.  Counseling provided for all of the vaccine components Orders Placed This Encounter  Procedures   Bayer DCA Hb A1c Waived    Fonda Levins, MD [facility] 02/26/2024, 1:31 PM

## 2024-02-26 NOTE — Addendum Note (Signed)
 Addended by: LEIGH ROSINA SAILOR on: 02/26/2024 01:36 PM   Modules accepted: Orders

## 2024-03-01 ENCOUNTER — Encounter: Payer: Self-pay | Admitting: *Deleted

## 2024-03-13 ENCOUNTER — Other Ambulatory Visit: Payer: Self-pay | Admitting: Family Medicine

## 2024-03-22 DIAGNOSIS — M25571 Pain in right ankle and joints of right foot: Secondary | ICD-10-CM | POA: Diagnosis not present

## 2024-03-22 DIAGNOSIS — I872 Venous insufficiency (chronic) (peripheral): Secondary | ICD-10-CM | POA: Diagnosis not present

## 2024-03-22 DIAGNOSIS — M1712 Unilateral primary osteoarthritis, left knee: Secondary | ICD-10-CM | POA: Diagnosis not present

## 2024-03-22 DIAGNOSIS — M79671 Pain in right foot: Secondary | ICD-10-CM | POA: Diagnosis not present

## 2024-03-30 DIAGNOSIS — M79674 Pain in right toe(s): Secondary | ICD-10-CM | POA: Diagnosis not present

## 2024-03-30 DIAGNOSIS — L84 Corns and callosities: Secondary | ICD-10-CM | POA: Diagnosis not present

## 2024-03-30 DIAGNOSIS — M79675 Pain in left toe(s): Secondary | ICD-10-CM | POA: Diagnosis not present

## 2024-03-30 DIAGNOSIS — I70203 Unspecified atherosclerosis of native arteries of extremities, bilateral legs: Secondary | ICD-10-CM | POA: Diagnosis not present

## 2024-03-30 DIAGNOSIS — B351 Tinea unguium: Secondary | ICD-10-CM | POA: Diagnosis not present

## 2024-04-21 NOTE — Progress Notes (Signed)
 Pharmacy Quality Measure Review  This patient is appearing on a report for being at risk of failing the adherence measure for cholesterol (statin) medications this calendar year.   Medication: pravastatin  80 mg Last fill date: 03/07/24 for 90 day supply  Insurance report was not up to date. No action needed at this time.   Jenkins Graces, PharmD PGY1 Pharmacy Resident

## 2024-05-07 ENCOUNTER — Other Ambulatory Visit: Payer: Self-pay | Admitting: Family Medicine

## 2024-05-26 ENCOUNTER — Other Ambulatory Visit: Payer: Self-pay | Admitting: Family Medicine

## 2024-05-26 DIAGNOSIS — F3342 Major depressive disorder, recurrent, in full remission: Secondary | ICD-10-CM

## 2024-05-28 ENCOUNTER — Ambulatory Visit: Payer: Self-pay | Admitting: Family Medicine

## 2024-05-28 ENCOUNTER — Encounter: Payer: Self-pay | Admitting: Family Medicine

## 2024-05-28 VITALS — BP 134/71 | HR 78 | Ht 65.0 in | Wt 302.0 lb

## 2024-05-28 DIAGNOSIS — I10 Essential (primary) hypertension: Secondary | ICD-10-CM | POA: Diagnosis not present

## 2024-05-28 DIAGNOSIS — M797 Fibromyalgia: Secondary | ICD-10-CM | POA: Diagnosis not present

## 2024-05-28 DIAGNOSIS — I152 Hypertension secondary to endocrine disorders: Secondary | ICD-10-CM | POA: Diagnosis not present

## 2024-05-28 DIAGNOSIS — E1169 Type 2 diabetes mellitus with other specified complication: Secondary | ICD-10-CM

## 2024-05-28 DIAGNOSIS — E1159 Type 2 diabetes mellitus with other circulatory complications: Secondary | ICD-10-CM

## 2024-05-28 DIAGNOSIS — E1140 Type 2 diabetes mellitus with diabetic neuropathy, unspecified: Secondary | ICD-10-CM

## 2024-05-28 DIAGNOSIS — I872 Venous insufficiency (chronic) (peripheral): Secondary | ICD-10-CM | POA: Diagnosis not present

## 2024-05-28 DIAGNOSIS — E785 Hyperlipidemia, unspecified: Secondary | ICD-10-CM

## 2024-05-28 DIAGNOSIS — F3342 Major depressive disorder, recurrent, in full remission: Secondary | ICD-10-CM | POA: Diagnosis not present

## 2024-05-28 DIAGNOSIS — E782 Mixed hyperlipidemia: Secondary | ICD-10-CM

## 2024-05-28 LAB — URINALYSIS, COMPLETE
Bilirubin, UA: NEGATIVE
Glucose, UA: NEGATIVE
Ketones, UA: NEGATIVE
Leukocytes,UA: NEGATIVE
Nitrite, UA: NEGATIVE
Protein,UA: NEGATIVE
RBC, UA: NEGATIVE
Specific Gravity, UA: 1.02 (ref 1.005–1.030)
Urobilinogen, Ur: 1 mg/dL (ref 0.2–1.0)
pH, UA: 7 (ref 5.0–7.5)

## 2024-05-28 LAB — MICROSCOPIC EXAMINATION
RBC, Urine: NONE SEEN /HPF (ref 0–2)
Renal Epithel, UA: NONE SEEN /HPF
Yeast, UA: NONE SEEN

## 2024-05-28 LAB — BAYER DCA HB A1C WAIVED: HB A1C (BAYER DCA - WAIVED): 6.6 % — ABNORMAL HIGH (ref 4.8–5.6)

## 2024-05-28 MED ORDER — GABAPENTIN 400 MG PO CAPS: 400.0000 mg | ORAL_CAPSULE | Freq: Three times a day (TID) | ORAL | 3 refills | Status: AC

## 2024-05-28 MED ORDER — LOSARTAN POTASSIUM 100 MG PO TABS: 100.0000 mg | ORAL_TABLET | Freq: Every day | ORAL | 3 refills | Status: AC

## 2024-05-28 MED ORDER — PRAVASTATIN SODIUM 80 MG PO TABS: 80.0000 mg | ORAL_TABLET | Freq: Every day | ORAL | 3 refills | Status: AC

## 2024-05-28 MED ORDER — DULOXETINE HCL 60 MG PO CPEP: 120.0000 mg | ORAL_CAPSULE | Freq: Every day | ORAL | 3 refills | Status: AC

## 2024-05-28 MED ORDER — BUPROPION HCL ER (XL) 300 MG PO TB24: 300.0000 mg | ORAL_TABLET | Freq: Every day | ORAL | 3 refills | Status: AC

## 2024-05-28 MED ORDER — AMLODIPINE BESYLATE 5 MG PO TABS: 5.0000 mg | ORAL_TABLET | Freq: Every day | ORAL | 3 refills | Status: AC

## 2024-05-28 NOTE — Progress Notes (Signed)
 "  BP 134/71   Pulse 78   Ht 5' 5 (1.651 m)   Wt (!) 302 lb (137 kg)   SpO2 97%   BMI 50.26 kg/m    Subjective:   Patient ID: Tiffany Brown, female    DOB: January 04, 1946, 79 y.o.   MRN: [id number]  HPI: Tiffany Brown is a 79 y.o. female presenting on 05/28/2024 for Medical Management of Chronic Issues, Diabetes, and Hypertension   Discussed the use of AI scribe software for clinical note transcription with the patient, who gave verbal consent to proceed.  History of Present Illness   Tiffany Brown is a 79 year old female with chronic pain and stasis dermatitis who presents for a recheck and dermatology referral.  Chronic pain - Significant chronic pain, partially managed with Cymbalta  (twice daily), gabapentin  (three times daily), and Wellbutrin  - Medications provide some relief but effects are not long-lasting  Stasis dermatitis and cutaneous findings - Worsening stasis dermatitis on the legs - Skin is red and weeping in some areas - Difficulty applying compression stockings - History of five or six skin cancers removed, including two melanomas  Urinary changes - New onset of foamy urine, unfamiliar to her and causing concern  Cardiovascular risk management - Currently taking pravastatin  for cholesterol management - Continues amlodipine  and losartan  for blood pressure control  Mobility and home equipment - Has access to a pedal exerciser and an electric device that moves her feet          Relevant past medical, surgical, family and social history reviewed and updated as indicated. Interim medical history since our last visit reviewed. Allergies and medications reviewed and updated.  Review of Systems  Constitutional:  Negative for chills and fever.  HENT:  Negative for congestion, ear discharge and ear pain.   Eyes:  Negative for visual disturbance.  Respiratory:  Negative for chest tightness and shortness of breath.   Cardiovascular:  Positive for leg  swelling. Negative for chest pain.  Musculoskeletal:  Negative for arthralgias, back pain and gait problem.  Skin:  Positive for color change. Negative for rash and wound.  Neurological:  Negative for dizziness, light-headedness and headaches.  Psychiatric/Behavioral:  Negative for agitation and behavioral problems.   All other systems reviewed and are negative.   Per HPI unless specifically indicated above   Allergies as of 05/28/2024   No Known Allergies      Medication List        Accurate as of May 28, 2024  1:17 PM. If you have any questions, ask your nurse or doctor.          amLODipine  5 MG tablet Commonly known as: NORVASC  Take 1 tablet (5 mg total) by mouth daily.   buPROPion  300 MG 24 hr tablet Commonly known as: WELLBUTRIN  XL Take 1 tablet (300 mg total) by mouth daily.   D3 PO Take 10 mg by mouth daily.   DULoxetine  60 MG capsule Commonly known as: CYMBALTA  Take 2 capsules (120 mg total) by mouth daily.   ferrous sulfate  325 (65 FE) MG EC tablet Take 1 tablet (325 mg total) by mouth 2 (two) times daily.   fluticasone  50 MCG/ACT nasal spray Commonly known as: FLONASE  PLACE 2 SPRAYS INTO BOTH NOSTRILS DAILY AS NEEDED FOR RHINITIS.   furosemide  40 MG tablet Commonly known as: LASIX  Take 0.5 tablets (20 mg total) by mouth daily.   gabapentin  400 MG capsule Commonly known as: NEURONTIN  Take 1 capsule (400 mg  total) by mouth 3 (three) times daily.   losartan  100 MG tablet Commonly known as: COZAAR  Take 1 tablet (100 mg total) by mouth daily.   Magnesium 250 MG Caps Take 250 mg by mouth daily.   pantoprazole  40 MG tablet Commonly known as: PROTONIX  TAKE 1 TABLET BY MOUTH EVERY DAY   potassium chloride  SA 20 MEQ tablet Commonly known as: KLOR-CON  M Take 1 tablet (20 mEq total) by mouth daily.   pravastatin  80 MG tablet Commonly known as: PRAVACHOL  Take 1 tablet (80 mg total) by mouth daily.   senna-docusate 8.6-50 MG tablet Commonly  known as: Senokot-S Take 1 tablet by mouth daily.   tamsulosin  0.4 MG Caps capsule Commonly known as: FLOMAX  Take 0.4 mg by mouth daily.   tolterodine  4 MG 24 hr capsule Commonly known as: Detrol  LA Take 1 capsule (4 mg total) by mouth daily.   trospium 20 MG tablet Commonly known as: SANCTURA Take 20 mg by mouth 2 (two) times daily.         Objective:   BP 134/71   Pulse 78   Ht 5' 5 (1.651 m)   Wt (!) 302 lb (137 kg)   SpO2 97%   BMI 50.26 kg/m   Wt Readings from Last 3 Encounters:  05/28/24 (!) 302 lb (137 kg)  02/26/24 293 lb (132.9 kg)  11/26/23 289 lb (131.1 kg)    Physical Exam Physical Exam   VITALS: BP- 134/71 CHEST: Lungs clear to auscultation bilaterally. CARDIOVASCULAR: Regular rate and rhythm, no murmurs. EXTREMITIES: 3+ swelling in extremities.  With stasis dermatitis and slight red-pink discoloration of both lower extremities no weeping today        Assessment & Plan:   Problem List Items Addressed This Visit       Cardiovascular and Mediastinum   Hypertension associated with diabetes (HCC)   Relevant Medications   amLODipine  (NORVASC ) 5 MG tablet   losartan  (COZAAR ) 100 MG tablet   pravastatin  (PRAVACHOL ) 80 MG tablet   Other Relevant Orders   CBC with Differential/Platelet   CMP14+EGFR   Lipid panel   TSH   Bayer DCA Hb A1c Waived   Microalbumin / creatinine urine ratio   Urinalysis, Complete   Urine Culture   Stasis dermatitis of both legs   Relevant Medications   amLODipine  (NORVASC ) 5 MG tablet   losartan  (COZAAR ) 100 MG tablet   pravastatin  (PRAVACHOL ) 80 MG tablet   Other Relevant Orders   Ambulatory referral to Dermatology     Endocrine   Hyperlipidemia associated with type 2 diabetes mellitus (HCC)   Relevant Medications   amLODipine  (NORVASC ) 5 MG tablet   losartan  (COZAAR ) 100 MG tablet   pravastatin  (PRAVACHOL ) 80 MG tablet   Other Relevant Orders   CBC with Differential/Platelet   CMP14+EGFR   Lipid panel    TSH   Bayer DCA Hb A1c Waived   Microalbumin / creatinine urine ratio   Urinalysis, Complete   Urine Culture   DM type 2 with diabetic dyslipidemia (HCC) - Primary   Relevant Medications   losartan  (COZAAR ) 100 MG tablet   pravastatin  (PRAVACHOL ) 80 MG tablet   Other Relevant Orders   CBC with Differential/Platelet   CMP14+EGFR   Lipid panel   TSH   Bayer DCA Hb A1c Waived   Microalbumin / creatinine urine ratio   Urinalysis, Complete   Urine Culture   Type 2 diabetes mellitus with diabetic neuropathy, unspecified (HCC)   Relevant Medications   gabapentin  (  NEURONTIN ) 400 MG capsule   losartan  (COZAAR ) 100 MG tablet   pravastatin  (PRAVACHOL ) 80 MG tablet   Other Relevant Orders   CBC with Differential/Platelet   CMP14+EGFR   Lipid panel   TSH   Bayer DCA Hb A1c Waived   Microalbumin / creatinine urine ratio   Urinalysis, Complete   Urine Culture     Other   Recurrent major depressive disorder, in full remission   Relevant Medications   buPROPion  (WELLBUTRIN  XL) 300 MG 24 hr tablet   DULoxetine  (CYMBALTA ) 60 MG capsule   Fibromyalgia   Relevant Medications   buPROPion  (WELLBUTRIN  XL) 300 MG 24 hr tablet   DULoxetine  (CYMBALTA ) 60 MG capsule   gabapentin  (NEURONTIN ) 400 MG capsule   Other Visit Diagnoses       Morbid (severe) obesity due to excess calories (HCC)              Fibromyalgia Chronic pain managed with Cymbalta , gabapentin , and Wellbutrin . Pain worsens with prolonged sitting. - Encouraged regular physical activity, including water aerobics and cycling. - Continue Cymbalta , gabapentin , and Wellbutrin .  Chronic venous insufficiency with stasis dermatitis Chronic venous insufficiency with stasis dermatitis. Symptoms include dry skin, redness, and occasional weeping. Compression stockings are difficult to apply. History of skin cancers necessitates regular dermatological evaluation. - Encouraged daily use of compression stockings. - Referred to  dermatologist for skin evaluation.  Essential hypertension Blood pressure well-controlled with amlodipine  and losartan . Recent reading 134/71 mmHg. - Continue amlodipine  and losartan .  Mixed hyperlipidemia Managed with pravastatin . - Continue pravastatin .          Follow up plan: Return in about 3 months (around 08/26/2024), or if symptoms worsen or fail to improve, for Diabetes recheck.  Counseling provided for all of the vaccine components Orders Placed This Encounter  Procedures   Urine Culture   CBC with Differential/Platelet   CMP14+EGFR   Lipid panel   TSH   Bayer DCA Hb A1c Waived   Microalbumin / creatinine urine ratio   Urinalysis, Complete   Ambulatory referral to Dermatology    Fonda Levins, MD [facility] 05/28/2024, 1:17 PM     "

## 2024-05-29 LAB — CBC WITH DIFFERENTIAL/PLATELET
Basophils Absolute: 0 10*3/uL (ref 0.0–0.2)
Basos: 0 %
EOS (ABSOLUTE): 0.2 10*3/uL (ref 0.0–0.4)
Eos: 2 %
Hematocrit: 39.2 % (ref 34.0–46.6)
Hemoglobin: 12.4 g/dL (ref 11.1–15.9)
Immature Grans (Abs): 0 10*3/uL (ref 0.0–0.1)
Immature Granulocytes: 0 %
Lymphocytes Absolute: 2.2 10*3/uL (ref 0.7–3.1)
Lymphs: 24 %
MCH: 28.6 pg (ref 26.6–33.0)
MCHC: 31.6 g/dL (ref 31.5–35.7)
MCV: 91 fL (ref 79–97)
Monocytes Absolute: 0.5 10*3/uL (ref 0.1–0.9)
Monocytes: 6 %
Neutrophils Absolute: 6.3 10*3/uL (ref 1.4–7.0)
Neutrophils: 68 %
Platelets: 188 10*3/uL (ref 150–450)
RBC: 4.33 x10E6/uL (ref 3.77–5.28)
RDW: 14.3 % (ref 11.7–15.4)
WBC: 9.4 10*3/uL (ref 3.4–10.8)

## 2024-05-29 LAB — CMP14+EGFR
ALT: 20 [IU]/L (ref 0–32)
AST: 21 [IU]/L (ref 0–40)
Albumin: 4.1 g/dL (ref 3.8–4.8)
Alkaline Phosphatase: 135 [IU]/L (ref 49–135)
BUN/Creatinine Ratio: 29 — ABNORMAL HIGH (ref 12–28)
BUN: 18 mg/dL (ref 8–27)
Bilirubin Total: 0.3 mg/dL (ref 0.0–1.2)
CO2: 27 mmol/L (ref 20–29)
Calcium: 8.8 mg/dL (ref 8.7–10.3)
Chloride: 103 mmol/L (ref 96–106)
Creatinine, Ser: 0.63 mg/dL (ref 0.57–1.00)
Globulin, Total: 2.4 g/dL (ref 1.5–4.5)
Glucose: 126 mg/dL — ABNORMAL HIGH (ref 70–99)
Potassium: 4.4 mmol/L (ref 3.5–5.2)
Sodium: 143 mmol/L (ref 134–144)
Total Protein: 6.5 g/dL (ref 6.0–8.5)
eGFR: 90 mL/min/{1.73_m2}

## 2024-05-29 LAB — LIPID PANEL
Chol/HDL Ratio: 2.8 ratio (ref 0.0–4.4)
Cholesterol, Total: 170 mg/dL (ref 100–199)
HDL: 60 mg/dL
LDL Chol Calc (NIH): 85 mg/dL (ref 0–99)
Triglycerides: 143 mg/dL (ref 0–149)
VLDL Cholesterol Cal: 25 mg/dL (ref 5–40)

## 2024-05-29 LAB — MICROALBUMIN / CREATININE URINE RATIO
Creatinine, Urine: 105.2 mg/dL
Microalb/Creat Ratio: 6 mg/g{creat} (ref 0–29)
Microalbumin, Urine: 5.8 ug/mL

## 2024-05-29 LAB — TSH: TSH: 4.2 u[IU]/mL (ref 0.450–4.500)

## 2024-05-30 LAB — URINE CULTURE

## 2024-06-02 ENCOUNTER — Ambulatory Visit: Payer: Self-pay | Admitting: Family Medicine

## 2024-07-30 ENCOUNTER — Encounter: Admitting: Vascular Surgery

## 2024-07-30 ENCOUNTER — Ambulatory Visit (HOSPITAL_COMMUNITY)

## 2024-08-26 ENCOUNTER — Ambulatory Visit: Admitting: Family Medicine
